# Patient Record
Sex: Female | Born: 1955
Health system: Southern US, Community
[De-identification: ages and names within clinical notes are randomized; demographics above are authoritative.]

## PROBLEM LIST (undated history)

## (undated) DIAGNOSIS — E785 Hyperlipidemia, unspecified: Secondary | ICD-10-CM

## (undated) DIAGNOSIS — R51 Headache: Secondary | ICD-10-CM

## (undated) DIAGNOSIS — N319 Neuromuscular dysfunction of bladder, unspecified: Secondary | ICD-10-CM

## (undated) DIAGNOSIS — R519 Headache, unspecified: Secondary | ICD-10-CM

## (undated) DIAGNOSIS — G35 Multiple sclerosis: Secondary | ICD-10-CM

## (undated) DIAGNOSIS — F0390 Unspecified dementia without behavioral disturbance: Secondary | ICD-10-CM

## (undated) HISTORY — PX: ABDOMINAL HYSTERECTOMY: SHX81

## (undated) HISTORY — DX: Neuromuscular dysfunction of bladder, unspecified: N31.9

## (undated) HISTORY — PX: OOPHORECTOMY: SHX86

## (undated) HISTORY — DX: Multiple sclerosis: G35

## (undated) HISTORY — DX: Hyperlipidemia, unspecified: E78.5

## (undated) HISTORY — DX: Headache, unspecified: R51.9

## (undated) HISTORY — DX: Headache: R51

---

## 1988-06-24 DIAGNOSIS — G35 Multiple sclerosis: Secondary | ICD-10-CM

## 1988-06-24 HISTORY — DX: Multiple sclerosis: G35

## 2004-04-28 ENCOUNTER — Emergency Department: Payer: Self-pay | Admitting: Emergency Medicine

## 2004-05-07 ENCOUNTER — Ambulatory Visit: Payer: Self-pay | Admitting: Specialist

## 2004-08-30 ENCOUNTER — Emergency Department: Payer: Self-pay | Admitting: Emergency Medicine

## 2006-06-03 ENCOUNTER — Ambulatory Visit: Payer: Self-pay | Admitting: Internal Medicine

## 2006-06-06 ENCOUNTER — Ambulatory Visit: Payer: Self-pay | Admitting: Internal Medicine

## 2007-04-16 ENCOUNTER — Ambulatory Visit: Payer: Self-pay | Admitting: Family Medicine

## 2007-04-22 ENCOUNTER — Encounter: Payer: Self-pay | Admitting: Family Medicine

## 2007-04-25 ENCOUNTER — Encounter: Payer: Self-pay | Admitting: Family Medicine

## 2007-05-25 ENCOUNTER — Encounter: Payer: Self-pay | Admitting: Family Medicine

## 2008-09-20 ENCOUNTER — Encounter: Payer: Self-pay | Admitting: Family Medicine

## 2008-09-22 ENCOUNTER — Encounter: Payer: Self-pay | Admitting: Family Medicine

## 2008-10-22 ENCOUNTER — Encounter: Payer: Self-pay | Admitting: Family Medicine

## 2010-02-14 ENCOUNTER — Emergency Department: Payer: Self-pay | Admitting: Emergency Medicine

## 2010-03-05 ENCOUNTER — Emergency Department: Payer: Self-pay | Admitting: Emergency Medicine

## 2012-06-09 ENCOUNTER — Encounter: Payer: Self-pay | Admitting: Neurology

## 2012-06-24 ENCOUNTER — Encounter: Payer: Self-pay | Admitting: Neurology

## 2012-09-17 ENCOUNTER — Ambulatory Visit: Payer: Self-pay | Admitting: Pain Medicine

## 2012-09-29 ENCOUNTER — Ambulatory Visit: Payer: Self-pay | Admitting: Pain Medicine

## 2012-09-30 ENCOUNTER — Ambulatory Visit: Payer: Self-pay | Admitting: Pain Medicine

## 2012-10-29 ENCOUNTER — Ambulatory Visit: Payer: Self-pay | Admitting: Pain Medicine

## 2012-11-17 ENCOUNTER — Ambulatory Visit: Payer: Self-pay | Admitting: Pain Medicine

## 2012-11-25 ENCOUNTER — Ambulatory Visit: Payer: Self-pay | Admitting: Pain Medicine

## 2012-12-16 ENCOUNTER — Ambulatory Visit: Payer: Self-pay | Admitting: Internal Medicine

## 2012-12-24 ENCOUNTER — Ambulatory Visit: Payer: Self-pay | Admitting: Pain Medicine

## 2013-01-20 ENCOUNTER — Ambulatory Visit: Payer: Self-pay | Admitting: Pain Medicine

## 2013-02-23 ENCOUNTER — Ambulatory Visit: Payer: Self-pay | Admitting: Pain Medicine

## 2013-03-03 ENCOUNTER — Ambulatory Visit: Payer: Self-pay | Admitting: Pain Medicine

## 2013-03-30 ENCOUNTER — Ambulatory Visit: Payer: Self-pay | Admitting: Pain Medicine

## 2013-04-28 ENCOUNTER — Ambulatory Visit: Payer: Self-pay | Admitting: Pain Medicine

## 2013-05-26 ENCOUNTER — Ambulatory Visit: Payer: Self-pay | Admitting: Pain Medicine

## 2013-06-09 ENCOUNTER — Ambulatory Visit: Payer: Self-pay | Admitting: Pain Medicine

## 2013-07-05 ENCOUNTER — Ambulatory Visit: Payer: Self-pay | Admitting: Pain Medicine

## 2013-07-12 ENCOUNTER — Ambulatory Visit: Payer: Self-pay | Admitting: Pain Medicine

## 2013-08-05 ENCOUNTER — Ambulatory Visit: Payer: Self-pay | Admitting: Pain Medicine

## 2013-09-02 ENCOUNTER — Ambulatory Visit: Payer: Self-pay | Admitting: Pain Medicine

## 2013-09-08 ENCOUNTER — Ambulatory Visit: Payer: Self-pay | Admitting: Pain Medicine

## 2013-09-30 ENCOUNTER — Ambulatory Visit: Payer: Self-pay | Admitting: Pain Medicine

## 2013-11-11 DIAGNOSIS — M545 Low back pain, unspecified: Secondary | ICD-10-CM | POA: Insufficient documentation

## 2013-11-17 ENCOUNTER — Emergency Department: Payer: Self-pay

## 2013-11-17 LAB — COMPREHENSIVE METABOLIC PANEL
ALK PHOS: 71 U/L
Albumin: 3.9 g/dL (ref 3.4–5.0)
Anion Gap: 5 — ABNORMAL LOW (ref 7–16)
BUN: 11 mg/dL (ref 7–18)
Bilirubin,Total: 0.4 mg/dL (ref 0.2–1.0)
CALCIUM: 9.1 mg/dL (ref 8.5–10.1)
Chloride: 110 mmol/L — ABNORMAL HIGH (ref 98–107)
Co2: 26 mmol/L (ref 21–32)
Creatinine: 0.57 mg/dL — ABNORMAL LOW (ref 0.60–1.30)
EGFR (African American): >60
Glucose: 88 mg/dL (ref 65–99)
OSMOLALITY: 280 (ref 275–301)
Potassium: 4.3 mmol/L (ref 3.5–5.1)
SGOT(AST): 29 U/L (ref 15–37)
SGPT (ALT): 19 U/L (ref 12–78)
Sodium: 141 mmol/L (ref 136–145)
Total Protein: 8 g/dL (ref 6.4–8.2)

## 2013-11-17 LAB — CBC WITH DIFFERENTIAL/PLATELET
BASOS ABS: 0.1 10*3/uL (ref 0.0–0.1)
Basophil %: 0.6 %
Eosinophil #: 0 10*3/uL (ref 0.0–0.7)
Eosinophil %: 0.3 %
HCT: 43.1 % (ref 35.0–47.0)
HGB: 14.2 g/dL (ref 12.0–16.0)
LYMPHS ABS: 1.2 10*3/uL (ref 1.0–3.6)
Lymphocyte %: 12.5 %
MCH: 33.6 pg (ref 26.0–34.0)
MCHC: 32.9 g/dL (ref 32.0–36.0)
MCV: 102 fL — ABNORMAL HIGH (ref 80–100)
Monocyte #: 0.5 x10 3/mm (ref 0.2–0.9)
Monocyte %: 5.5 %
NEUTROS ABS: 7.6 10*3/uL — AB (ref 1.4–6.5)
Neutrophil %: 81.1 %
Platelet: 202 10*3/uL (ref 150–440)
RBC: 4.22 10*6/uL (ref 3.80–5.20)
RDW: 13.7 % (ref 11.5–14.5)
WBC: 9.3 10*3/uL (ref 3.6–11.0)

## 2013-11-17 LAB — URINALYSIS, COMPLETE
BILIRUBIN, UR: NEGATIVE
BLOOD: NEGATIVE
Bacteria: NONE SEEN
Glucose,UR: NEGATIVE mg/dL (ref 0–75)
Leukocyte Esterase: NEGATIVE
Nitrite: NEGATIVE
PROTEIN: NEGATIVE
Ph: 6 (ref 4.5–8.0)
Specific Gravity: 1.019 (ref 1.003–1.030)

## 2013-11-17 LAB — TROPONIN I

## 2014-06-06 ENCOUNTER — Ambulatory Visit: Payer: Self-pay | Admitting: Unknown Physician Specialty

## 2014-08-09 ENCOUNTER — Ambulatory Visit: Payer: Self-pay | Admitting: Neurology

## 2014-10-17 LAB — SURGICAL PATHOLOGY

## 2014-12-02 ENCOUNTER — Ambulatory Visit: Payer: Self-pay | Admitting: Family Medicine

## 2014-12-28 ENCOUNTER — Ambulatory Visit: Payer: Medicare Other | Admitting: Family Medicine

## 2015-01-03 ENCOUNTER — Ambulatory Visit (INDEPENDENT_AMBULATORY_CARE_PROVIDER_SITE_OTHER): Payer: Medicare Other | Admitting: Family Medicine

## 2015-01-03 ENCOUNTER — Encounter: Payer: Self-pay | Admitting: Family Medicine

## 2015-01-03 VITALS — BP 123/81 | HR 58 | Temp 97.7°F | Resp 16 | Ht 65.0 in | Wt 165.4 lb

## 2015-01-03 DIAGNOSIS — F325 Major depressive disorder, single episode, in full remission: Secondary | ICD-10-CM | POA: Insufficient documentation

## 2015-01-03 DIAGNOSIS — J449 Chronic obstructive pulmonary disease, unspecified: Secondary | ICD-10-CM | POA: Diagnosis not present

## 2015-01-03 DIAGNOSIS — R32 Unspecified urinary incontinence: Secondary | ICD-10-CM | POA: Insufficient documentation

## 2015-01-03 DIAGNOSIS — N393 Stress incontinence (female) (male): Secondary | ICD-10-CM | POA: Diagnosis not present

## 2015-01-03 DIAGNOSIS — N319 Neuromuscular dysfunction of bladder, unspecified: Secondary | ICD-10-CM | POA: Diagnosis not present

## 2015-01-03 DIAGNOSIS — G8929 Other chronic pain: Secondary | ICD-10-CM | POA: Insufficient documentation

## 2015-01-03 DIAGNOSIS — F32A Depression, unspecified: Secondary | ICD-10-CM

## 2015-01-03 DIAGNOSIS — Z0000 Encounter for general adult medical examination without abnormal findings: Secondary | ICD-10-CM | POA: Diagnosis not present

## 2015-01-03 DIAGNOSIS — F329 Major depressive disorder, single episode, unspecified: Secondary | ICD-10-CM

## 2015-01-03 DIAGNOSIS — E782 Mixed hyperlipidemia: Secondary | ICD-10-CM | POA: Diagnosis not present

## 2015-01-03 DIAGNOSIS — G35 Multiple sclerosis: Secondary | ICD-10-CM | POA: Insufficient documentation

## 2015-01-03 DIAGNOSIS — M810 Age-related osteoporosis without current pathological fracture: Secondary | ICD-10-CM

## 2015-01-03 DIAGNOSIS — J432 Centrilobular emphysema: Secondary | ICD-10-CM | POA: Insufficient documentation

## 2015-01-03 DIAGNOSIS — M8580 Other specified disorders of bone density and structure, unspecified site: Secondary | ICD-10-CM | POA: Insufficient documentation

## 2015-01-03 DIAGNOSIS — K219 Gastro-esophageal reflux disease without esophagitis: Secondary | ICD-10-CM | POA: Insufficient documentation

## 2015-01-03 DIAGNOSIS — G35A Relapsing-remitting multiple sclerosis: Secondary | ICD-10-CM | POA: Insufficient documentation

## 2015-01-03 MED ORDER — PRAVASTATIN SODIUM 40 MG PO TABS: 40.0000 mg | ORAL_TABLET | Freq: Every day | ORAL | Status: DC

## 2015-01-03 MED ORDER — ALBUTEROL SULFATE HFA 108 (90 BASE) MCG/ACT IN AERS: 2.0000 | INHALATION_SPRAY | Freq: Four times a day (QID) | RESPIRATORY_TRACT | Status: DC | PRN

## 2015-01-03 NOTE — Assessment & Plan Note (Signed)
Renewed pravastatin today. Check lipid panel.

## 2015-01-03 NOTE — Assessment & Plan Note (Signed)
Incontinence related to MS. Renewed incontinence supplies today.

## 2015-01-03 NOTE — Addendum Note (Signed)
Addended byLaroy Apple, AMY L on: 01/03/2015 11:17 AM   Modules accepted: SmartSet

## 2015-01-03 NOTE — Patient Instructions (Signed)
We will check your lung function with a breathing test at the hospital. We will also check in on your bones by doing a bone study for your osteoporosis.   Please get your lab work done to check in on your cholesterol and kidney function.

## 2015-01-03 NOTE — Progress Notes (Addendum)
Subjective:    Patient ID: [name], female    DOB: 1956/01/06, 59 y.o.   MRN: [id number]  HPI: [name] is a 59 y.o. female presenting on 01/03/2015 for Annual Exam   HPI  Pt presents for annual exam. Overall doing well at home. She has a history of MS that is treated by [facility] she takes Avonex once weekly for her symptoms. Also seeing psych and chronic pain.  Her depression is treated by psychiatry.  She does have incontinence related to neurogenic bladder from MS. Pt reports she is unable to feel when she has to void, urine leaks out. She is currently using depends and pads to manage her incotinence.  Pt has history of osteoporosis for which she takes fosamx once weekly. Unsure of last bone density scan. Pt is requesting a renewal of her pravastatin today.   No pap smear is needed due to hysterecotomy. Mammogram done in 2015- no family history of breast cancer, would like to screen every 2 years.  Colonoscopy done 2015.   Past Medical History  Diagnosis Date  . Hyperlipidemia   . Headache   . Depression   . MS (multiple sclerosis) 1990  . Neurogenic bladder   . Osteoporosis    History   Social History  . Marital Status: Divorced    Spouse Name: N/A  . Number of Children: N/A  . Years of Education: N/A   Occupational History  . Not on file.   Social History Main Topics  . Smoking status: Former Smoker -- 1.00 packs/day for 20 years    Quit date: 01/03/1995  . Smokeless tobacco: Not on file  . Alcohol Use: No  . Drug Use: No  . Sexual Activity: Not on file   Other Topics Concern  . Not on file   Social History Narrative  . No narrative on file   Family History  Problem Relation Age of Onset  . Diabetes Mother   . Heart disease Mother   . Diabetes Father   . Heart disease Father   . Diabetes Sister   . Heart disease Sister    No current outpatient prescriptions on file prior to visit.   No current facility-administered medications on  file prior to visit.    Review of Systems  Constitutional: Negative for fever and chills.  HENT: Negative.   Eyes: Negative for photophobia and visual disturbance.  Respiratory: Negative for chest tightness, shortness of breath and wheezing.   Cardiovascular: Negative for chest pain, palpitations and leg swelling.  Gastrointestinal: Negative for nausea, vomiting and abdominal pain.  Endocrine: Negative for cold intolerance, heat intolerance, polydipsia, polyphagia and polyuria.  Genitourinary: Positive for difficulty urinating (neurogenic bladder). Negative for vaginal bleeding, vaginal discharge, vaginal pain and pelvic pain.  Musculoskeletal: Positive for gait problem (due to MS).  Skin: Negative.   Neurological: Positive for weakness (from MS) and numbness (from MS.). Negative for dizziness, syncope and facial asymmetry.  Psychiatric/Behavioral: Positive for dysphoric mood (managed by psychiatry.).   Per HPI unless specifically indicated above     Objective:    BP 123/81 mmHg  Pulse 58  Temp(Src) 97.7 F (36.5 C) (Oral)  Resp 16  Ht  (1.651 m)  Wt 165 lb 6.4 oz (75.025 kg)  BMI 27.52 kg/m2  LMP   Wt Readings from Last 3 Encounters:  01/03/15 165 lb 6.4 oz (75.025 kg)    Physical Exam  Constitutional: She is oriented to person, place, and time.  She appears well-developed and well-nourished.  HENT:  Head: Normocephalic and atraumatic.  Neck: Neck supple.  Cardiovascular: Normal rate, regular rhythm and normal heart sounds.  Exam reveals no gallop and no friction rub.   No murmur heard. Pulmonary/Chest: Effort normal and breath sounds normal. She has no wheezes. She exhibits no tenderness.  Abdominal: Soft. Normal appearance and bowel sounds are normal. She exhibits no distension and no mass. There is no tenderness. There is no rebound and no guarding.  Genitourinary: No breast swelling, tenderness, discharge or bleeding.  Pt declined bimanual exam today.    Musculoskeletal: Normal range of motion. She exhibits no edema or tenderness.  Lymphadenopathy:    She has no cervical adenopathy.  Neurological: She is alert and oriented to person, place, and time. She displays no tremor. No cranial nerve deficit or sensory deficit. She exhibits normal muscle tone. She displays no seizure activity. Gait abnormal.  Skin: Skin is warm and dry.       Assessment & Plan:   Problem List Items Addressed This Visit      Respiratory   COPD (chronic obstructive pulmonary disease)   Relevant Medications   tiotropium (SPIRIVA) 18 MCG inhalation capsule   albuterol (PROVENTIL HFA;VENTOLIN HFA) 108 (90 BASE) MCG/ACT inhaler   Other Relevant Orders   Pulmonary function test     Nervous and Auditory   MS (multiple sclerosis)   Relevant Medications   Interferon Beta-1a (AVONEX PREFILLED IM)     Musculoskeletal and Integument   Osteoporosis    Pt reports taking Fosamax once weekly. She is unsure how [place] she has been on this medication. Obtain new DXA scan. Consider d/cing medication if duration of therapy > 5 years.  Check Vitamin D.      Relevant Orders   DG Bone Density   Vit D  25 hydroxy (rtn osteoporosis monitoring)     Other   Mixed hyperlipidemia    Renewed pravastatin today. Check lipid panel.       Relevant Medications   pravastatin (PRAVACHOL) 40 MG tablet   Other Relevant Orders   Comprehensive Metabolic Panel (CMET)   Lipid Profile   Depression   Chronic pain   Relevant Medications   meloxicam (MOBIC) 7.5 MG tablet   gabapentin (NEURONTIN) 300 MG capsule   Neurogenic bladder    Incontinence related to MS. Renewed incontinence supplies today.       Incontinence in female    Renewed incontinence supplies today.        Other Visit Diagnoses    Annual physical exam    -  Primary       Meds ordered this encounter  Medications  . meloxicam (MOBIC) 7.5 MG tablet    Sig: Take 7.5 mg by mouth 2 (two) times daily.  [name] Kitchen gabapentin  (NEURONTIN) 300 MG capsule    Sig: Take 300 mg by mouth 3 (three) times daily.  . potassium chloride (K-DUR) 10 MEQ tablet    Sig: Take 10 mEq by mouth daily.  [name] Kitchen DISCONTD: pravastatin (PRAVACHOL) 40 MG tablet    Sig: Take 40 mg by mouth daily.  . pantoprazole (PROTONIX) 40 MG tablet    Sig: Take 40 mg by mouth daily.  [name] Kitchen tiotropium (SPIRIVA) 18 MCG inhalation capsule    Sig: Place 18 mcg into inhaler and inhale daily.  . Interferon Beta-1a (AVONEX PREFILLED IM)    Sig: Inject 30 mg into the muscle once a week.  . pravastatin (PRAVACHOL) 40 MG tablet  Sig: Take 1 tablet (40 mg total) by mouth daily.    Dispense:  30 tablet    Refill:  11    Order Specific Question:  Supervising Provider    Answer:  Janeann Forehand [phone]  . albuterol (PROVENTIL HFA;VENTOLIN HFA) 108 (90 BASE) MCG/ACT inhaler    Sig: Inhale 2 puffs into the lungs every 6 (six) hours as needed for wheezing or shortness of breath.    Dispense:  1 Inhaler    Refill:  11    Order Specific Question:  Supervising Provider    Answer:  Janeann Forehand [[id number]]      Follow up plan: Return in about 6 months (around 07/06/2015) for hyperlipidemia.

## 2015-01-03 NOTE — Assessment & Plan Note (Signed)
Renewed incontinence supplies today.

## 2015-01-03 NOTE — Assessment & Plan Note (Signed)
Pt reports taking Fosamax once weekly. She is unsure how [place] she has been on this medication. Obtain new DXA scan. Consider d/cing medication if duration of therapy > 5 years.  Check Vitamin D.

## 2015-01-04 ENCOUNTER — Other Ambulatory Visit: Payer: Self-pay | Admitting: Family Medicine

## 2015-01-04 ENCOUNTER — Telehealth: Payer: Self-pay

## 2015-01-04 ENCOUNTER — Other Ambulatory Visit: Payer: Self-pay

## 2015-01-04 DIAGNOSIS — M810 Age-related osteoporosis without current pathological fracture: Secondary | ICD-10-CM

## 2015-01-04 DIAGNOSIS — J449 Chronic obstructive pulmonary disease, unspecified: Secondary | ICD-10-CM

## 2015-01-04 NOTE — Telephone Encounter (Signed)
Spoke to aid taking care of pt told her about 2 appointment that we scheduled one for spirometry on 01/10/2015 at 10:00 am needed to arrive by 9:30 am and next is bone density on 01/11/2015 at 9:40 am  At [facility]

## 2015-01-07 LAB — LIPID PANEL
CHOL/HDL RATIO: 3.3 ratio (ref 0.0–4.4)
CHOLESTEROL TOTAL: 170 mg/dL (ref 100–199)
HDL: 52 mg/dL (ref >39)
LDL CALC: 96 mg/dL (ref 0–99)
TRIGLYCERIDES: 110 mg/dL (ref 0–149)
VLDL CHOLESTEROL CAL: 22 mg/dL (ref 5–40)

## 2015-01-07 LAB — COMPREHENSIVE METABOLIC PANEL
A/G RATIO: 2 (ref 1.1–2.5)
ALBUMIN: 4.5 g/dL (ref 3.5–5.5)
ALT: 18 IU/L (ref 0–32)
AST: 19 IU/L (ref 0–40)
Alkaline Phosphatase: 81 IU/L (ref 39–117)
BILIRUBIN TOTAL: 0.5 mg/dL (ref 0.0–1.2)
BUN/Creatinine Ratio: 17 (ref 9–23)
BUN: 11 mg/dL (ref 6–24)
CHLORIDE: 101 mmol/L (ref 97–108)
CO2: 25 mmol/L (ref 18–29)
Calcium: 9.5 mg/dL (ref 8.7–10.2)
Creatinine, Ser: 0.64 mg/dL (ref 0.57–1.00)
GFR calc Af Amer: 115 mL/min/{1.73_m2} (ref >59)
GFR calc non Af Amer: 99 mL/min/{1.73_m2} (ref >59)
GLUCOSE: 94 mg/dL (ref 65–99)
Globulin, Total: 2.2 g/dL (ref 1.5–4.5)
Potassium: 4.5 mmol/L (ref 3.5–5.2)
Sodium: 144 mmol/L (ref 134–144)
TOTAL PROTEIN: 6.7 g/dL (ref 6.0–8.5)

## 2015-01-07 LAB — VITAMIN D 25 HYDROXY (VIT D DEFICIENCY, FRACTURES): Vit D, 25-Hydroxy: 21.7 ng/mL — ABNORMAL LOW (ref 30.0–100.0)

## 2015-01-10 ENCOUNTER — Telehealth: Payer: Self-pay | Admitting: Family Medicine

## 2015-01-10 ENCOUNTER — Other Ambulatory Visit: Payer: Self-pay | Admitting: Family Medicine

## 2015-01-10 ENCOUNTER — Ambulatory Visit: Payer: Medicare Other | Attending: Family Medicine | Admitting: Internal Medicine

## 2015-01-10 DIAGNOSIS — J449 Chronic obstructive pulmonary disease, unspecified: Secondary | ICD-10-CM

## 2015-01-10 DIAGNOSIS — E559 Vitamin D deficiency, unspecified: Secondary | ICD-10-CM

## 2015-01-10 MED ORDER — CHOLECALCIFEROL 50 MCG (2000 UT) PO CAPS: 1.0000 | ORAL_CAPSULE | Freq: Every day | ORAL | Status: DC

## 2015-01-10 MED ORDER — ALBUTEROL SULFATE (2.5 MG/3ML) 0.083% IN NEBU
2.5000 mg | INHALATION_SOLUTION | Freq: Once | RESPIRATORY_TRACT | Status: AC
  Administered 2015-01-10: 2.5 mg via RESPIRATORY_TRACT
  Filled 2015-01-10: qty 3

## 2015-01-10 NOTE — Telephone Encounter (Signed)
Reviewed lab results with patient. Her vitamin D is low. She will start 2000 IU OTC daily. Sent to pharmacy. Pt spirometry also not consistent with COPD. Pt reports her previous provider did a breathing test years back and started her on medications. Pt reports she never feels short of breath "unless I am running or in a hurry."  Pt would like to try a few days without Spiriva to see how her breathing does. Consider pulmonology referral if breathing issues continue.

## 2015-01-11 ENCOUNTER — Ambulatory Visit
Admission: RE | Admit: 2015-01-11 | Discharge: 2015-01-11 | Disposition: A | Discharge status: Home / Self Care | Payer: Medicare Other | Source: Ambulatory Visit | Attending: Family Medicine | Admitting: Family Medicine

## 2015-01-11 DIAGNOSIS — Z87310 Personal history of (healed) osteoporosis fracture: Secondary | ICD-10-CM | POA: Diagnosis present

## 2015-01-11 DIAGNOSIS — M85862 Other specified disorders of bone density and structure, left lower leg: Secondary | ICD-10-CM | POA: Diagnosis not present

## 2015-01-12 ENCOUNTER — Telehealth: Payer: Self-pay | Admitting: Family Medicine

## 2015-01-12 NOTE — Telephone Encounter (Signed)
Called pt to review bone density scan- she is osteopenic but not a candidate for treatment. Encouraged 20 minutes weight bearing exercise, 1200mg  dietary calcium, and vitamin D daily.

## 2015-03-07 ENCOUNTER — Other Ambulatory Visit: Payer: Self-pay | Admitting: Neurology

## 2015-03-07 DIAGNOSIS — G35 Multiple sclerosis: Secondary | ICD-10-CM

## 2015-04-24 DIAGNOSIS — M79605 Pain in left leg: Secondary | ICD-10-CM | POA: Insufficient documentation

## 2015-05-29 ENCOUNTER — Ambulatory Visit (INDEPENDENT_AMBULATORY_CARE_PROVIDER_SITE_OTHER): Payer: Medicare Other | Admitting: Family Medicine

## 2015-05-29 ENCOUNTER — Encounter: Payer: Self-pay | Admitting: Family Medicine

## 2015-05-29 VITALS — BP 110/71 | HR 74 | Temp 98.0°F | Resp 16 | Ht 65.0 in | Wt 160.0 lb

## 2015-05-29 DIAGNOSIS — N760 Acute vaginitis: Secondary | ICD-10-CM

## 2015-05-29 DIAGNOSIS — R300 Dysuria: Secondary | ICD-10-CM

## 2015-05-29 DIAGNOSIS — G35 Multiple sclerosis: Secondary | ICD-10-CM

## 2015-05-29 LAB — POCT WET PREP (WET MOUNT): KOH WET PREP POC: NEGATIVE

## 2015-05-29 MED ORDER — CANE MISC: 1.0000 | Status: AC | PRN

## 2015-05-29 MED ORDER — FLUCONAZOLE 150 MG PO TABS: 150.0000 mg | ORAL_TABLET | Freq: Once | ORAL | Status: DC

## 2015-05-29 NOTE — Assessment & Plan Note (Signed)
Managed by neuro. Pt requesting cane prescription to help with ambulation today.

## 2015-05-29 NOTE — Patient Instructions (Addendum)
Monilial Vaginitis Vaginitis in a soreness, swelling and redness (inflammation) of the vagina and vulva. Monilial vaginitis is not a sexually transmitted infection. CAUSES  Yeast vaginitis is caused by yeast (candida) that is normally found in your vagina. With a yeast infection, the candida has overgrown in number to a point that upsets the chemical balance. SYMPTOMS   White, thick vaginal discharge.  Swelling, itching, redness and irritation of the vagina and possibly the lips of the vagina (vulva).  Burning or painful urination.  Painful intercourse. DIAGNOSIS  Things that may contribute to monilial vaginitis are:  Postmenopausal and virginal states.  Pregnancy.  Infections.  Being tired, sick or stressed, especially if you had monilial vaginitis in the past.  Diabetes. Good control will help lower the chance.  Birth control pills.  Tight fitting garments.  Using bubble bath, feminine sprays, douches or deodorant tampons.  Taking certain medications that kill germs (antibiotics).  Sporadic recurrence can occur if you become ill. TREATMENT  Your caregiver will give you medication.  There are several kinds of anti monilial vaginal creams and suppositories specific for monilial vaginitis. For recurrent yeast infections, use a suppository or cream in the vagina 2 times a week, or as directed.  Anti-monilial or steroid cream for the itching or irritation of the vulva may also be used. Get your caregiver's permission.  Painting the vagina with methylene blue solution may help if the monilial cream does not work.  Eating yogurt may help prevent monilial vaginitis. HOME CARE INSTRUCTIONS   Finish all medication as prescribed.  Do not have sex until treatment is completed or after your caregiver tells you it is okay.  Take warm sitz baths.  Do not douche.  Do not use tampons, especially scented ones.  Wear cotton underwear.  Avoid tight pants and panty  hose.  Tell your sexual partner that you have a yeast infection. They should go to their caregiver if they have symptoms such as mild rash or itching.  Your sexual partner should be treated as well if your infection is difficult to eliminate.  Practice safer sex. Use condoms.  Some vaginal medications cause latex condoms to fail. Vaginal medications that harm condoms are:  Cleocin cream.  Butoconazole (Femstat).  Terconazole (Terazol) vaginal suppository.  Miconazole (Monistat) (may be purchased over the counter). SEEK MEDICAL CARE IF:   You have a temperature by mouth above 102 F (38.9 C).  The infection is getting worse after 2 days of treatment.  The infection is not getting better after 3 days of treatment.  You develop blisters in or around your vagina.  You develop vaginal bleeding, and it is not your menstrual period.  You have pain when you urinate.  You develop intestinal problems.  You have pain with sexual intercourse.   This information is not intended to replace advice given to you by your health care provider. Make sure you discuss any questions you have with your health care provider.   Document Released: 03/20/2005 Document Revised: 09/02/2011 Document Reviewed: 12/12/2014 Elsevier Interactive Patient Education 2016 Else[employer] Inc[employer]  Monilial Vaginitis Vaginitis in a soreness, swelling and redness (inflammation) of the vagina and vulva. Monilial vaginitis is not a sexually transmitted infection. CAUSES  Yeast vaginitis is caused by yeast (candida) that is normally found in your vagina. With a yeast infection, the candida has overgrown in number to a point that upsets the chemical balance. SYMPTOMS  White, thick vaginal discharge. Swelling, itching, redness and irritation of the vagina and possibly   the lips of the vagina (vulva). Burning or painful urination. Painful intercourse. DIAGNOSIS  Things that may contribute to monilial vaginitis  are: Postmenopausal and virginal states. Pregnancy. Infections. Being tired, sick or stressed, especially if you had monilial vaginitis in the past. Diabetes. Good control will help lower the chance. Birth control pills. Tight fitting garments. Using bubble bath, feminine sprays, douches or deodorant tampons. Taking certain medications that kill germs (antibiotics). Sporadic recurrence can occur if you become ill. TREATMENT  Your caregiver will give you medication. There are several kinds of anti monilial vaginal creams and suppositories specific for monilial vaginitis. For recurrent yeast infections, use a suppository or cream in the vagina 2 times a week, or as directed. Anti-monilial or steroid cream for the itching or irritation of the vulva may also be used. Get your caregiver's permission. Painting the vagina with methylene blue solution may help if the monilial cream does not work. Eating yogurt may help prevent monilial vaginitis. HOME CARE INSTRUCTIONS  Finish all medication as prescribed. Do not have sex until treatment is completed or after your caregiver tells you it is okay. Take warm sitz baths. Do not douche. Do not use tampons, especially scented ones. Wear cotton underwear. Avoid tight pants and panty hose. Tell your sexual partner that you have a yeast infection. They should go to their caregiver if they have symptoms such as mild rash or itching. Your sexual partner should be treated as well if your infection is difficult to eliminate. Practice safer sex. Use condoms. Some vaginal medications cause latex condoms to fail. Vaginal medications that harm condoms are: Cleocin cream. Butoconazole (Femstat). Terconazole (Terazol) vaginal suppository. Miconazole (Monistat) (may be purchased over the counter). SEEK MEDICAL CARE IF:  You have a temperature by mouth above 102 F (38.9 C). The infection is getting worse after 2 days of treatment. The infection is not  getting better after 3 days of treatment. You develop blisters in or around your vagina. You develop vaginal bleeding, and it is not your menstrual period. You have pain when you urinate. You develop intestinal problems. You have pain with sexual intercourse.   This information is not intended to replace advice given to you by your health care provider. Make sure you discuss any questions you have with your health care provider.   Document Released: 03/20/2005 Document Revised: 09/02/2011 Document Reviewed: 12/12/2014 Elsevier Interactive Patient Education 2016 Elsevier Inc.  

## 2015-05-29 NOTE — Progress Notes (Signed)
Subjective:    Patient ID: [name], female    DOB: February 14, 1956, 59 y.o.   MRN: [id number]  HPI: [name] is a 59 y.o. female presenting on 05/29/2015 for Vaginitis   Vaginal Discharge The patient's primary symptoms include genital itching and vaginal discharge. The patient's pertinent negatives include no genital lesions or genital odor. This is a new problem. The current episode started in the past 7 days. The problem has been gradually worsening. The pain is mild. Associated symptoms include dysuria. Pertinent negatives include no chills, flank pain, frequency, hematuria, joint swelling, painful intercourse or vomiting. The vaginal discharge was white. There has been no bleeding. She is not sexually active. She is postmenopausal.  Pt also reporting itching buring including urination.   Past Medical History  Diagnosis Date  . Hyperlipidemia   . Headache   . Depression   . MS (multiple sclerosis) 1990  . Neurogenic bladder   . Osteoporosis    Past Surgical History  Procedure Laterality Date  . Abdominal hysterectomy    . Oophorectomy Bilateral     Current Outpatient Prescriptions on File Prior to Visit  Medication Sig  . albuterol (PROVENTIL HFA;VENTOLIN HFA) 108 (90 BASE) MCG/ACT inhaler Inhale 2 puffs into the lungs every 6 (six) hours as needed for wheezing or shortness of breath.  . Cholecalciferol 2000 UNITS CAPS Take 1 capsule (2,000 Units total) by mouth daily.  [name] Kitchen gabapentin (NEURONTIN) 300 MG capsule Take 300 mg by mouth 3 (three) times daily.  . Interferon Beta-1a (AVONEX PREFILLED IM) Inject 30 mg into the muscle once a week.  . meloxicam (MOBIC) 7.5 MG tablet Take 7.5 mg by mouth 2 (two) times daily.  . pantoprazole (PROTONIX) 40 MG tablet Take 40 mg by mouth daily.  . potassium chloride (K-DUR) 10 MEQ tablet Take 10 mEq by mouth daily.  . pravastatin (PRAVACHOL) 40 MG tablet Take 1 tablet (40 mg total) by mouth daily.  [name] Kitchen tiotropium (SPIRIVA) 18 MCG  inhalation capsule Place 18 mcg into inhaler and inhale daily.   No current facility-administered medications on file prior to visit.    Review of Systems  Constitutional: Negative for chills.  Respiratory: Negative for chest tightness and shortness of breath.   Cardiovascular: Negative for chest pain, palpitations and leg swelling.  Gastrointestinal: Negative for vomiting.  Genitourinary: Positive for dysuria and vaginal discharge. Negative for frequency, hematuria and flank pain.   Per HPI unless specifically indicated above     Objective:    BP 110/71 mmHg  Pulse 74  Temp(Src) 98 F (36.7 C) (Oral)  Resp 16  Ht 5\' 5"  (1.651 m)  Wt 160 lb (72.576 kg)  BMI 26.63 kg/m2  Wt Readings from Last 3 Encounters:  05/29/15 160 lb (72.576 kg)  01/03/15 165 lb 6.4 oz (75.025 kg)    Physical Exam  Constitutional: She is oriented to person, place, and time. She appears well-developed and well-nourished.  HENT:  Head: Normocephalic and atraumatic.  Neck: Normal range of motion. Neck supple. No thyromegaly present.  Cardiovascular: Normal rate and regular rhythm.  Exam reveals no gallop and no friction rub.   No murmur heard. Pulmonary/Chest: Breath sounds normal. No respiratory distress. She has no wheezes.  Abdominal: Soft. Bowel sounds are normal. She exhibits no distension. There is no tenderness.  Genitourinary:    No breast swelling, tenderness or discharge. There is no rash or tenderness on the right labia. There is no rash, tenderness or lesion on the left  labia. No tenderness in the vagina. Vaginal discharge (copious amounts white cheesy discharge. ) found.  Uterus surgically absent.   Musculoskeletal: Normal range of motion. She exhibits no edema or tenderness.  Lymphadenopathy:    She has no cervical adenopathy.  Neurological: She is alert and oriented to person, place, and time.  Skin: Skin is warm and dry.  Psychiatric: She has a normal mood and affect. Her behavior is  normal. Judgment normal.   Results for orders placed or performed in visit on 05/29/15  POCT Wet Prep [facility])  Result Value Ref Range   Source Wet Prep POC     WBC, Wet Prep HPF POC few    Bacteria Wet Prep HPF POC Few None, Few, Too numerous to count   BACTERIA WET PREP MORPHOLOGY POC     Clue Cells Wet Prep HPF POC None None, Too numerous to count   Clue Cells Wet Prep Whiff POC     Yeast Wet Prep HPF POC Many    KOH Wet Prep POC neg    Trichomonas Wet Prep HPF POC none       Assessment & Plan:   Problem List Items Addressed This Visit      Nervous and Auditory   MS (multiple sclerosis) (HCC)    Managed by neuro. Pt requesting cane prescription to help with ambulation today.       Relevant Medications   Misc. Devices (CANE) MISC    Other Visit Diagnoses    Vaginitis    -  Primary    Treat for yeast, positive wet prep. Diflucan x 1.  RTC is symptoms not improved.     Relevant Orders    POCT Wet Prep [facility]) (Completed)    Dysuria        Pt unable to void today and give urine sample. Pt instructed to return to give urine sample if symptoms continue.        Meds ordered this encounter  Medications  . fluconazole (DIFLUCAN) 150 MG tablet    Sig: Take 1 tablet (150 mg total) by mouth once.    Dispense:  1 tablet    Refill:  0    Order Specific Question:  Supervising Provider    Answer:  Janeann Forehand [[id number]]  . Misc. Devices (CANE) MISC    Sig: 1 each by Does not apply route as needed.    Dispense:  1 each    Refill:  0    Order Specific Question:  Supervising Provider    Answer:  Janeann Forehand [phone]      Follow up plan: Return if symptoms worsen or fail to improve.

## 2015-07-11 ENCOUNTER — Ambulatory Visit: Payer: Medicare Other | Admitting: Family Medicine

## 2015-07-12 ENCOUNTER — Other Ambulatory Visit: Payer: Self-pay | Admitting: Family Medicine

## 2015-07-12 NOTE — Telephone Encounter (Signed)
Received rx request from pharmacy for refill.

## 2015-07-13 MED ORDER — TIOTROPIUM BROMIDE MONOHYDRATE 18 MCG IN CAPS: 18.0000 ug | ORAL_CAPSULE | Freq: Every day | RESPIRATORY_TRACT | Status: DC

## 2015-07-13 MED ORDER — ALENDRONATE SODIUM 70 MG PO TABS: 70.0000 mg | ORAL_TABLET | ORAL | Status: DC

## 2015-07-13 MED ORDER — PANTOPRAZOLE SODIUM 40 MG PO TBEC: 40.0000 mg | DELAYED_RELEASE_TABLET | Freq: Every day | ORAL | Status: DC

## 2015-07-13 MED ORDER — SOLIFENACIN SUCCINATE 5 MG PO TABS: 5.0000 mg | ORAL_TABLET | Freq: Every day | ORAL | Status: DC

## 2016-01-23 ENCOUNTER — Other Ambulatory Visit: Payer: Self-pay | Admitting: Family Medicine

## 2016-01-23 DIAGNOSIS — E559 Vitamin D deficiency, unspecified: Secondary | ICD-10-CM

## 2016-01-23 DIAGNOSIS — E782 Mixed hyperlipidemia: Secondary | ICD-10-CM

## 2016-01-23 DIAGNOSIS — J449 Chronic obstructive pulmonary disease, unspecified: Secondary | ICD-10-CM

## 2016-02-02 ENCOUNTER — Ambulatory Visit (INDEPENDENT_AMBULATORY_CARE_PROVIDER_SITE_OTHER): Payer: Medicare Other | Admitting: Family Medicine

## 2016-02-02 ENCOUNTER — Encounter: Payer: Self-pay | Admitting: Family Medicine

## 2016-02-02 VITALS — BP 130/79 | HR 67 | Temp 98.3°F | Resp 16 | Ht 65.0 in | Wt 141.0 lb

## 2016-02-02 DIAGNOSIS — E559 Vitamin D deficiency, unspecified: Secondary | ICD-10-CM

## 2016-02-02 DIAGNOSIS — Z0000 Encounter for general adult medical examination without abnormal findings: Secondary | ICD-10-CM | POA: Diagnosis not present

## 2016-02-02 DIAGNOSIS — G35 Multiple sclerosis: Secondary | ICD-10-CM | POA: Diagnosis not present

## 2016-02-02 DIAGNOSIS — R32 Unspecified urinary incontinence: Secondary | ICD-10-CM

## 2016-02-02 DIAGNOSIS — N319 Neuromuscular dysfunction of bladder, unspecified: Secondary | ICD-10-CM

## 2016-02-02 DIAGNOSIS — N393 Stress incontinence (female) (male): Secondary | ICD-10-CM

## 2016-02-02 DIAGNOSIS — Z1211 Encounter for screening for malignant neoplasm of colon: Secondary | ICD-10-CM | POA: Diagnosis not present

## 2016-02-02 DIAGNOSIS — J449 Chronic obstructive pulmonary disease, unspecified: Secondary | ICD-10-CM

## 2016-02-02 DIAGNOSIS — Z1239 Encounter for other screening for malignant neoplasm of breast: Secondary | ICD-10-CM

## 2016-02-02 DIAGNOSIS — M8580 Other specified disorders of bone density and structure, unspecified site: Secondary | ICD-10-CM | POA: Diagnosis not present

## 2016-02-02 DIAGNOSIS — E782 Mixed hyperlipidemia: Secondary | ICD-10-CM

## 2016-02-02 LAB — CBC WITH DIFFERENTIAL/PLATELET
BASOS ABS: 0 {cells}/uL (ref 0–200)
BASOS PCT: 0 %
EOS ABS: 35 {cells}/uL (ref 15–500)
Eosinophils Relative: 1 %
HCT: 38.4 % (ref 35.0–45.0)
Hemoglobin: 12.8 g/dL (ref 11.7–15.5)
LYMPHS PCT: 24 %
Lymphs Abs: 840 cells/uL — ABNORMAL LOW (ref 850–3900)
MCH: 33.5 pg — AB (ref 27.0–33.0)
MCHC: 33.3 g/dL (ref 32.0–36.0)
MCV: 100.5 fL — AB (ref 80.0–100.0)
MONOS PCT: 15 %
MPV: 10.1 fL (ref 7.5–12.5)
Monocytes Absolute: 525 cells/uL (ref 200–950)
NEUTROS PCT: 60 %
Neutro Abs: 2100 cells/uL (ref 1500–7800)
PLATELETS: 211 10*3/uL (ref 140–400)
RBC: 3.82 MIL/uL (ref 3.80–5.10)
RDW: 13.8 % (ref 11.0–15.0)
WBC: 3.5 10*3/uL — ABNORMAL LOW (ref 3.8–10.8)

## 2016-02-02 LAB — LIPID PANEL
Cholesterol: 158 mg/dL (ref 125–200)
HDL: 78 mg/dL (ref >=46)
LDL CALC: 67 mg/dL (ref <130)
TRIGLYCERIDES: 63 mg/dL (ref <150)
Total CHOL/HDL Ratio: 2 Ratio (ref <=5.0)
VLDL: 13 mg/dL (ref <30)

## 2016-02-02 LAB — COMPLETE METABOLIC PANEL WITH GFR
ALT: 12 U/L (ref 6–29)
AST: 13 U/L (ref 10–35)
Albumin: 4.3 g/dL (ref 3.6–5.1)
Alkaline Phosphatase: 58 U/L (ref 33–130)
BUN: 12 mg/dL (ref 7–25)
CHLORIDE: 106 mmol/L (ref 98–110)
CO2: 30 mmol/L (ref 20–31)
CREATININE: 0.57 mg/dL (ref 0.50–1.05)
Calcium: 9.4 mg/dL (ref 8.6–10.4)
Glucose, Bld: 77 mg/dL (ref 65–99)
Potassium: 4 mmol/L (ref 3.5–5.3)
Sodium: 142 mmol/L (ref 135–146)
Total Bilirubin: 0.5 mg/dL (ref 0.2–1.2)
Total Protein: 6.6 g/dL (ref 6.1–8.1)

## 2016-02-02 MED ORDER — TOLTERODINE TARTRATE ER 2 MG PO CP24: 2.0000 mg | ORAL_CAPSULE | Freq: Every day | ORAL | 11 refills | Status: DC

## 2016-02-02 NOTE — Assessment & Plan Note (Signed)
Relapsing remitting type. Pt is on Tecfidera and Ampyra BID. Unclear the last time she saw neurology. MRI in 02/2015. Appears to be managed by [facility]- Dr. Malvin Johns.

## 2016-02-02 NOTE — Assessment & Plan Note (Signed)
Stable no breathing issues. Will do spirometry next month.

## 2016-02-02 NOTE — Assessment & Plan Note (Signed)
Last DXA scan on July of 2017. Osteopenia only. Had previously been on Fosamax for many years. Will stop today. Encouraged vitamin D daily and dietary calcium. Plan for DXA scan next year.

## 2016-02-02 NOTE — Assessment & Plan Note (Signed)
Change to tolterdine LA to see if symptoms can be improved. Will recheck next month.

## 2016-02-02 NOTE — Assessment & Plan Note (Addendum)
Recheck Vitamin D levels today. Continue supplementation for her osteopenia.

## 2016-02-02 NOTE — Patient Instructions (Addendum)
STOP taking the Fosamax for osteoporosis. Just take vitamin D supplements and get plenty of calcium in your diet.   Please call [facility] to order your mammogram.  We will send the Cologuard to your house to screen for colon cancer.   Health Maintenance, Female Adopting a healthy lifestyle and getting preventive care can go a [place] way to promote health and wellness. Talk with your health care provider about what schedule of regular examinations is right for you. This is a good chance for you to check in with your provider about disease prevention and staying healthy. In between checkups, there are plenty of things you can do on your own. Experts have done a lot of research about which lifestyle changes and preventive measures are most likely to keep you healthy. Ask your health care provider for more information. WEIGHT AND DIET  Eat a healthy diet  Be sure to include plenty of vegetables, fruits, low-fat dairy products, and lean protein.  Do not eat a lot of foods high in solid fats, added sugars, or salt.  Get regular exercise. This is one of the most important things you can do for your health.  Most adults should exercise for at least 150 minutes each week. The exercise should increase your heart rate and make you sweat (moderate-intensity exercise).  Most adults should also do strengthening exercises at least twice a week. This is in addition to the moderate-intensity exercise.  Maintain a healthy weight  Body mass index (BMI) is a measurement that can be used to identify possible weight problems. It estimates body fat based on height and weight. Your health care provider can help determine your BMI and help you achieve or maintain a healthy weight.  For females 25 years of age and older:   A BMI below 18.5 is considered underweight.  A BMI of 18.5 to 24.9 is normal.  A BMI of 25 to 29.9 is considered overweight.  A BMI of 30 and above is considered obese.  Watch  levels of cholesterol and blood lipids  You should start having your blood tested for lipids and cholesterol at 59 years of age, then have this test every 5 years.  You may need to have your cholesterol levels checked more often if:  Your lipid or cholesterol levels are high.  You are older than 59 years of age.  You are at high risk for heart disease.  CANCER SCREENING   Lung Cancer  Lung cancer screening is recommended for adults 12-47 years old who are at high risk for lung cancer because of a history of smoking.  A yearly low-dose CT scan of the lungs is recommended for people who:  Currently smoke.  Have quit within the past 15 years.  Have at least a 30-pack-year history of smoking. A pack year is smoking an average of one pack of cigarettes a day for 1 year.  Yearly screening should continue until it has been 15 years since you quit.  Yearly screening should stop if you develop a health problem that would prevent you from having lung cancer treatment.  Breast Cancer  Practice breast self-awareness. This means understanding how your breasts normally appear and feel.  It also means doing regular breast self-exams. Let your health care provider know about any changes, no matter how small.  If you are in your 20s or 30s, you should have a clinical breast exam (CBE) by a health care provider every 1-3 years as part of a  regular health exam.  If you are 40 or older, have a CBE every year. Also consider having a breast X-ray (mammogram) every year.  If you have a family history of breast cancer, talk to your health care provider about genetic screening.  If you are at high risk for breast cancer, talk to your health care provider about having an MRI and a mammogram every year.  Breast cancer gene (BRCA) assessment is recommended for women who have family members with BRCA-related cancers. BRCA-related cancers include:  Breast.  Ovarian.  Tubal.  Peritoneal  cancers.  Results of the assessment will determine the need for genetic counseling and BRCA1 and BRCA2 testing. Cervical Cancer Your health care provider may recommend that you be screened regularly for cancer of the pelvic organs (ovaries, uterus, and vagina). This screening involves a pelvic examination, including checking for microscopic changes to the surface of your cervix (Pap test). You may be encouraged to have this screening done every 3 years, beginning at age 64.  For women ages 9-65, health care providers may recommend pelvic exams and Pap testing every 3 years, or they may recommend the Pap and pelvic exam, combined with testing for human papilloma virus (HPV), every 5 years. Some types of HPV increase your risk of cervical cancer. Testing for HPV may also be done on women of any age with unclear Pap test results.  Other health care providers may not recommend any screening for nonpregnant women who are considered low risk for pelvic cancer and who do not have symptoms. Ask your health care provider if a screening pelvic exam is right for you.  If you have had past treatment for cervical cancer or a condition that could lead to cancer, you need Pap tests and screening for cancer for at least 20 years after your treatment. If Pap tests have been discontinued, your risk factors (such as having a new sexual partner) need to be reassessed to determine if screening should resume. Some women have medical problems that increase the chance of getting cervical cancer. In these cases, your health care provider may recommend more frequent screening and Pap tests. Colorectal Cancer  This type of cancer can be detected and often prevented.  Routine colorectal cancer screening usually begins at 59 years of age and continues through 59 years of age.  Your health care provider may recommend screening at an earlier age if you have risk factors for colon cancer.  Your health care provider may also  recommend using home test kits to check for hidden blood in the stool.  A small camera at the end of a tube can be used to examine your colon directly (sigmoidoscopy or colonoscopy). This is done to check for the earliest forms of colorectal cancer.  Routine screening usually begins at age 64.  Direct examination of the colon should be repeated every 5-10 years through 59 years of age. However, you may need to be screened more often if early forms of precancerous polyps or small growths are found. Skin Cancer  Check your skin from head to toe regularly.  Tell your health care provider about any new moles or changes in moles, especially if there is a change in a mole's shape or color.  Also tell your health care provider if you have a mole that is larger than the size of a pencil eraser.  Always use sunscreen. Apply sunscreen liberally and repeatedly throughout the day.  Protect yourself by wearing Bies sleeves, pants, a wide-brimmed hat,  and sunglasses whenever you are outside. HEART DISEASE, DIABETES, AND HIGH BLOOD PRESSURE   High blood pressure causes heart disease and increases the risk of stroke. High blood pressure is more likely to develop in:  People who have blood pressure in the high end of the normal range (130-139/85-89 mm Hg).  People who are overweight or obese.  People who are African American.  If you are 75-94 years of age, have your blood pressure checked every 3-5 years. If you are 32 years of age or older, have your blood pressure checked every year. You should have your blood pressure measured twice--once when you are at a hospital or clinic, and once when you are not at a hospital or clinic. Record the average of the two measurements. To check your blood pressure when you are not at a hospital or clinic, you can use:  An automated blood pressure machine at a pharmacy.  A home blood pressure monitor.  If you are between 41 years and 23 years old, ask your health  care provider if you should take aspirin to prevent strokes.  Have regular diabetes screenings. This involves taking a blood sample to check your fasting blood sugar level.  If you are at a normal weight and have a low risk for diabetes, have this test once every three years after 59 years of age.  If you are overweight and have a high risk for diabetes, consider being tested at a younger age or more often. PREVENTING INFECTION  Hepatitis B  If you have a higher risk for hepatitis B, you should be screened for this virus. You are considered at high risk for hepatitis B if:  You were born in a country where hepatitis B is common. Ask your health care provider which countries are considered high risk.  Your parents were born in a high-risk country, and you have not been immunized against hepatitis B (hepatitis B vaccine).  You have HIV or AIDS.  You use needles to inject street drugs.  You live with someone who has hepatitis B.  You have had sex with someone who has hepatitis B.  You get hemodialysis treatment.  You take certain medicines for conditions, including cancer, organ transplantation, and autoimmune conditions. Hepatitis C  Blood testing is recommended for:  Everyone born from [birth year] through 1964.  Anyone with known risk factors for hepatitis C. Sexually transmitted infections (STIs)  You should be screened for sexually transmitted infections (STIs) including gonorrhea and chlamydia if:  You are sexually active and are younger than 59 years of age.  You are older than 59 years of age and your health care provider tells you that you are at risk for this type of infection.  Your sexual activity has changed since you were last screened and you are at an increased risk for chlamydia or gonorrhea. Ask your health care provider if you are at risk.  If you do not have HIV, but are at risk, it may be recommended that you take a prescription medicine daily to prevent HIV  infection. This is called pre-exposure prophylaxis (PrEP). You are considered at risk if:  You are sexually active and do not regularly use condoms or know the HIV status of your partner(s).  You take drugs by injection.  You are sexually active with a partner who has HIV. Talk with your health care provider about whether you are at high risk of being infected with HIV. If you choose to begin PrEP, you should  first be tested for HIV. You should then be tested every 3 months for as [place] as you are taking PrEP.  PREGNANCY   If you are premenopausal and you may become pregnant, ask your health care provider about preconception counseling.  If you may become pregnant, take 400 to 800 micrograms (mcg) of folic acid every day.  If you want to prevent pregnancy, talk to your health care provider about birth control (contraception). OSTEOPOROSIS AND MENOPAUSE   Osteoporosis is a disease in which the bones lose minerals and strength with aging. This can result in serious bone fractures. Your risk for osteoporosis can be identified using a bone density scan.  If you are 69 years of age or older, or if you are at risk for osteoporosis and fractures, ask your health care provider if you should be screened.  Ask your health care provider whether you should take a calcium or vitamin D supplement to lower your risk for osteoporosis.  Menopause may have certain physical symptoms and risks.  Hormone replacement therapy may reduce some of these symptoms and risks. Talk to your health care provider about whether hormone replacement therapy is right for you.  HOME CARE INSTRUCTIONS   Schedule regular health, dental, and eye exams.  Stay current with your immunizations.   Do not use any tobacco products including cigarettes, chewing tobacco, or electronic cigarettes.  If you are pregnant, do not drink alcohol.  If you are breastfeeding, limit how much and how often you drink alcohol.  Limit  alcohol intake to no more than 1 drink per day for nonpregnant women. One drink equals 12 ounces of beer, 5 ounces of wine, or 1 ounces of hard liquor.  Do not use street drugs.  Do not share needles.  Ask your health care provider for help if you need support or information about quitting drugs.  Tell your health care provider if you often feel depressed.  Tell your health care provider if you have ever been abused or do not feel safe at home.   This information is not intended to replace advice given to you by your health care provider. Make sure you discuss any questions you have with your health care provider.   Document Released: 12/24/2010 Document Revised: 07/01/2014 Document Reviewed: 05/12/2013 Elsevier Interactive Patient Education [employer].

## 2016-02-02 NOTE — Assessment & Plan Note (Signed)
Incontinence supplies renewed until May 2018. Encouraged Kegel exercises.

## 2016-02-02 NOTE — Progress Notes (Signed)
Subjective:    Patient ID: [name], female    DOB: 1955-11-25, 60 y.o.   MRN: [id number]  HPI: [name] is a 60 y.o. female presenting on 02/02/2016 for Annual Exam   HPI  Pt presents for annual exam and refill of paperwork. Overall doing well at home. Pt has MS and poor intellectual functioning. She is a poor historian. Has a caregiver at home who helps her. Is not present today. [name] is in the home 5 days per week. Otherwise the patient is alone. She does report the [facility] nurse came out to the home yesterday to ask her questions.  Dr. Malvin Johns is managing MS- she is on Tecfidera and Ampyra BID. Unsure of the last time she saw Dr. Malvin Johns. Per care everywhere 03/09/2015. Has urinary incontinence associated with her MS and neurogenic bladder. Wears incontinence pads to help. Has some urge incontinence. Does have the sensation to void.   Health maintenance: Bone density- Has been fosamax for many years but a bone density scan last year showed osteopenia only with low fracture index.  Needs mammogram. Avoid Zoster 2/2 MS and possible immunosuppressive therapy.  Prevnar not needed until age 24. Pt reports she had a pneumonia vaccination in the past.    Past Medical History:  Diagnosis Date  . Depression   . Headache   . Hyperlipidemia   . MS (multiple sclerosis) (HCC) 1990  . Neurogenic bladder   . Osteoporosis    Social History   Social History  . Marital status: Divorced    Spouse name: N/A  . Number of children: N/A  . Years of education: N/A   Occupational History  . Not on file.   Social History Main Topics  . Smoking status: Former Smoker    Packs/day: 1.00    Years: 20.00    Quit date: 01/03/1995  . Smokeless tobacco: Former Neurosurgeon  . Alcohol use No  . Drug use:      Comment: smokes pods  . Sexual activity: Not on file   Other Topics Concern  . Not on file   Social History Narrative  . No narrative on file   Family History  Problem Relation Age of  Onset  . Diabetes Mother   . Heart disease Mother   . Diabetes Father   . Heart disease Father   . Diabetes Sister   . Heart disease Sister    Current Outpatient Prescriptions on File Prior to Visit  Medication Sig  . Cholecalciferol (VITAMIN D3) 2000 units TABS TAKE ONE (1) TABLET EACH DAY  . fluconazole (DIFLUCAN) 150 MG tablet Take 1 tablet (150 mg total) by mouth once.  . gabapentin (NEURONTIN) 300 MG capsule Take 300 mg by mouth 3 (three) times daily.  . meloxicam (MOBIC) 7.5 MG tablet Take 7.5 mg by mouth 2 (two) times daily.  . Misc. Devices (CANE) MISC 1 each by Does not apply route as needed.  . pantoprazole (PROTONIX) 40 MG tablet Take 1 tablet (40 mg total) by mouth daily.  . pravastatin (PRAVACHOL) 40 MG tablet TAKE ONE (1) TABLET BY MOUTH EVERY DAY  . PROAIR HFA 108 (90 Base) MCG/ACT inhaler USE 2 PUFFS EVERY SIX HOURS AS NEEDED FOR WHEEZING OR SHORTNESS OF BREATH  . tiotropium (SPIRIVA) 18 MCG inhalation capsule Place 1 capsule (18 mcg total) into inhaler and inhale daily.   No current facility-administered medications on file prior to visit.     Review of Systems  Constitutional: Negative for  chills and fever.  HENT: Negative.   Respiratory: Negative for cough, chest tightness and wheezing.   Cardiovascular: Negative for chest pain and leg swelling.  Gastrointestinal: Negative for abdominal pain, constipation, diarrhea, nausea and vomiting.  Endocrine: Negative.  Negative for cold intolerance, heat intolerance, polydipsia, polyphagia and polyuria.  Genitourinary: Positive for urgency. Negative for difficulty urinating and dysuria.       Incontinence.   Musculoskeletal: Negative.   Neurological: Negative for dizziness, light-headedness and numbness.  Psychiatric/Behavioral: Negative.    Per HPI unless specifically indicated above     Objective:    BP 130/79 (BP Location: Left Arm, Patient Position: Sitting, Cuff Size: Normal)   Pulse 67   Temp 98.3 F (36.8  C) (Oral)   Resp 16   Ht 5\' 5"  (1.651 m)   Wt 141 lb (64 kg)   BMI 23.46 kg/m   Wt Readings from Last 3 Encounters:  02/02/16 141 lb (64 kg)  05/29/15 160 lb (72.6 kg)  01/03/15 165 lb 6.4 oz (75 kg)    Physical Exam  Constitutional: She is oriented to person, place, and time. She appears well-developed and well-nourished.  HENT:  Head: Normocephalic and atraumatic.  Neck: Neck supple.  Cardiovascular: Normal rate, regular rhythm and normal heart sounds.  Exam reveals no gallop and no friction rub.   No murmur heard. Pulmonary/Chest: Effort normal and breath sounds normal. She has no wheezes. She has no rales. She exhibits no tenderness. Right breast exhibits no inverted nipple, no mass, no nipple discharge, no skin change and no tenderness. Left breast exhibits no inverted nipple, no mass, no nipple discharge, no skin change and no tenderness.  Abdominal: Soft. Normal appearance and bowel sounds are normal. She exhibits no distension and no mass. There is no tenderness. There is no rebound and no guarding.  Musculoskeletal: Normal range of motion. She exhibits no edema or tenderness.  Lymphadenopathy:    She has no cervical adenopathy.  Neurological: She is alert and oriented to person, place, and time.  Skin: Skin is warm and dry.   Results for orders placed or performed in visit on 05/29/15  POCT Wet Prep [facility])  Result Value Ref Range   Source Wet Prep POC     WBC, Wet Prep HPF POC few    Bacteria Wet Prep HPF POC Few None, Few, Too numerous to count   BACTERIA WET PREP MORPHOLOGY POC     Clue Cells Wet Prep HPF POC None None, Too numerous to count   Clue Cells Wet Prep Whiff POC     Yeast Wet Prep HPF POC Many    KOH Wet Prep POC neg    Trichomonas Wet Prep HPF POC none       Assessment & Plan:   Problem List Items Addressed This Visit      Respiratory   COPD (chronic obstructive pulmonary disease) (HCC)    Stable no breathing issues. Will do spirometry next  month.         Nervous and Auditory   MS (multiple sclerosis) (HCC)    Relapsing remitting type. Pt is on Tecfidera and Ampyra BID. Unclear the last time she saw neurology. MRI in 02/2015. Appears to be managed by [facility]- Dr. Malvin Johns.       Relevant Medications   Dimethyl Fumarate (TECFIDERA) 240 MG CPDR   dalfampridine (AMPYRA) 10 MG TB12   Other Relevant Orders   COMPLETE METABOLIC PANEL WITH GFR   CBC with Differential  Musculoskeletal and Integument   Osteopenia    Last DXA scan on July of 2017. Osteopenia only. Had previously been on Fosamax for many years. Will stop today. Encouraged vitamin D daily and dietary calcium. Plan for DXA scan next year.         Other   Mixed hyperlipidemia   Relevant Orders   Lipid Profile   Neurogenic bladder    Change to tolterdine LA to see if symptoms can be improved. Will recheck next month.       Relevant Medications   tolterodine (DETROL LA) 2 MG 24 hr capsule   Incontinence in female    Incontinence supplies renewed until May 2018. Encouraged Kegel exercises.       Relevant Medications   tolterodine (DETROL LA) 2 MG 24 hr capsule   Vitamin D deficiency    Recheck Vitamin D levels today. Continue supplementation for her osteopenia.       Relevant Orders   VITAMIN D 25 Hydroxy (Vit-D Deficiency, Fractures)    Other Visit Diagnoses    Well woman exam (no gynecological exam)    -  Primary   Screening for colon cancer       Relevant Orders   Cologuard   Screening for breast cancer       Relevant Orders   MM Digital Screening      Meds ordered this encounter  Medications  . Dimethyl Fumarate (TECFIDERA) 240 MG CPDR    Sig: Take 240 mg by mouth 2 (two) times daily.  [name] Kitchen dalfampridine (AMPYRA) 10 MG TB12    Sig: Take 10 mg by mouth 2 (two) times daily.  [name] Kitchen tolterodine (DETROL LA) 2 MG 24 hr capsule    Sig: Take 1 capsule (2 mg total) by mouth daily.    Dispense:  30 capsule    Refill:  11    Order Specific  Question:   Supervising Provider    Answer:   Janeann Forehand [phone]      Follow up plan: Return in about 1 month (around 03/04/2016), or if symptoms worsen or fail to improve.

## 2016-02-03 LAB — VITAMIN D 25 HYDROXY (VIT D DEFICIENCY, FRACTURES): Vit D, 25-Hydroxy: 33 ng/mL (ref 30–100)

## 2016-02-05 ENCOUNTER — Telehealth: Payer: Self-pay | Admitting: Family Medicine

## 2016-02-05 NOTE — Telephone Encounter (Signed)
Reviewed labs with care giver. White count has decreased. Pt is on immune modulating therapy for MS. Have made an appt for her on Wednesday at 945am to see Dr. Malvin Johns for a check-up. Caregiver verbalized understanding.

## 2016-02-09 ENCOUNTER — Other Ambulatory Visit: Payer: Self-pay | Admitting: Family Medicine

## 2016-02-09 DIAGNOSIS — R32 Unspecified urinary incontinence: Secondary | ICD-10-CM

## 2016-02-09 MED ORDER — MIRABEGRON ER 25 MG PO TB24: 25.0000 mg | ORAL_TABLET | Freq: Every day | ORAL | 11 refills | Status: DC

## 2016-02-13 ENCOUNTER — Telehealth: Payer: Self-pay | Admitting: *Deleted

## 2016-02-13 NOTE — Telephone Encounter (Signed)
Tolterodine ER 2mg  capsule has been approved under Medicare Part D thru 06/23/16. [id number]

## 2016-03-01 ENCOUNTER — Telehealth: Payer: Self-pay | Admitting: Family Medicine

## 2016-03-01 NOTE — Telephone Encounter (Signed)
Pt home health aid called to get her an appt. She is having a burning when she urinates.  She has an appt on Monday but the aid is not sure if she can accompany her.  Alarm symptoms reviewed with patient and caregiver. To Er or urgent care over the weekend as needed.  Needs referral to podiatry.

## 2016-03-01 NOTE — Telephone Encounter (Signed)
[name]  with  [facility] called [phone]   She  Thinks that  Pt  May have  A UTI ,Yeast infection, also she is requesting  A  Referral  For pt to  go to a podiatry to have nails cut

## 2016-03-04 ENCOUNTER — Ambulatory Visit (INDEPENDENT_AMBULATORY_CARE_PROVIDER_SITE_OTHER): Payer: Medicare Other | Admitting: Family Medicine

## 2016-03-04 ENCOUNTER — Encounter: Payer: Self-pay | Admitting: Family Medicine

## 2016-03-04 VITALS — BP 122/79 | HR 66 | Temp 98.7°F | Resp 16 | Ht 65.0 in | Wt 141.0 lb

## 2016-03-04 DIAGNOSIS — Z23 Encounter for immunization: Secondary | ICD-10-CM

## 2016-03-04 DIAGNOSIS — B373 Candidiasis of vulva and vagina: Secondary | ICD-10-CM | POA: Diagnosis not present

## 2016-03-04 DIAGNOSIS — N898 Other specified noninflammatory disorders of vagina: Secondary | ICD-10-CM

## 2016-03-04 DIAGNOSIS — R824 Acetonuria: Secondary | ICD-10-CM | POA: Diagnosis not present

## 2016-03-04 DIAGNOSIS — B3731 Acute candidiasis of vulva and vagina: Secondary | ICD-10-CM

## 2016-03-04 LAB — POCT WET PREP (WET MOUNT)
Clue Cells Wet Prep Whiff POC: NEGATIVE
TRICHOMONAS WET PREP HPF POC: ABSENT
WBC WET PREP: NONE SEEN

## 2016-03-04 LAB — POCT URINALYSIS DIPSTICK
BILIRUBIN UA: NEGATIVE
Blood, UA: NEGATIVE
GLUCOSE UA: NEGATIVE
LEUKOCYTES UA: NEGATIVE
NITRITE UA: NEGATIVE
Protein, UA: NEGATIVE
Spec Grav, UA: 1.02
Urobilinogen, UA: 0.2
pH, UA: 5

## 2016-03-04 LAB — BASIC METABOLIC PANEL WITH GFR
BUN: 15 mg/dL (ref 7–25)
CHLORIDE: 104 mmol/L (ref 98–110)
CO2: 29 mmol/L (ref 20–31)
Calcium: 9.3 mg/dL (ref 8.6–10.4)
Creat: 0.47 mg/dL — ABNORMAL LOW (ref 0.50–1.05)
GFR, Est African American: >89 mL/min (ref >=60)
Glucose, Bld: 71 mg/dL (ref 65–99)
POTASSIUM: 4.2 mmol/L (ref 3.5–5.3)
SODIUM: 142 mmol/L (ref 135–146)

## 2016-03-04 MED ORDER — FLUCONAZOLE 150 MG PO TABS: 150.0000 mg | ORAL_TABLET | Freq: Once | ORAL | 0 refills | Status: AC

## 2016-03-04 NOTE — Progress Notes (Signed)
Subjective:    Patient ID: [name], female    DOB: 04/25/1956, 60 y.o.   MRN: [id number]  HPI: [name] is a 60 y.o. female presenting on 03/04/2016 for Urinary Tract Infection   HPI  Pt presents for possible UTI vs yeast infection. Her UA showed no signs of infection. Symptoms started with burning of labia when she voids. No dysuria. No pubic pain. No flank pain.  Burning in vagina started 2 weeks ago. Pt has been using feminine wipes and has noticed some improvement in symptoms. Unsure if there is discharge. Vagina feels irritated when she voids. She is incontinent of urine Urine showed ketones- pt reports she has not had much to drink today. Tries to drink mainly water.  She has noted the Mybetriq helps her hold her urine longer.  Pt desires flu shot today.   Past Medical History:  Diagnosis Date  . Depression   . Headache   . Hyperlipidemia   . MS (multiple sclerosis) (HCC) 1990  . Neurogenic bladder   . Osteoporosis     Current Outpatient Prescriptions on File Prior to Visit  Medication Sig  . Cholecalciferol (VITAMIN D3) 2000 units TABS TAKE ONE (1) TABLET EACH DAY  . dalfampridine (AMPYRA) 10 MG TB12 Take 10 mg by mouth 2 (two) times daily.  . Dimethyl Fumarate (TECFIDERA) 240 MG CPDR Take 240 mg by mouth 2 (two) times daily.  [name] Kitchen gabapentin (NEURONTIN) 300 MG capsule Take 300 mg by mouth 3 (three) times daily.  . meloxicam (MOBIC) 7.5 MG tablet Take 7.5 mg by mouth 2 (two) times daily.  . mirabegron ER (MYRBETRIQ) 25 MG TB24 tablet Take 1 tablet (25 mg total) by mouth daily.  . Misc. Devices (CANE) MISC 1 each by Does not apply route as needed.  . pantoprazole (PROTONIX) 40 MG tablet Take 1 tablet (40 mg total) by mouth daily.  . pravastatin (PRAVACHOL) 40 MG tablet TAKE ONE (1) TABLET BY MOUTH EVERY DAY  . PROAIR HFA 108 (90 Base) MCG/ACT inhaler USE 2 PUFFS EVERY SIX HOURS AS NEEDED FOR WHEEZING OR SHORTNESS OF BREATH  . tiotropium (SPIRIVA) 18 MCG inhalation  capsule Place 1 capsule (18 mcg total) into inhaler and inhale daily.   No current facility-administered medications on file prior to visit.     Review of Systems  Genitourinary: Positive for flank pain, frequency and vaginal discharge. Negative for dysuria.       Vaginal burning.    Per HPI unless specifically indicated above     Objective:    BP 122/79 (BP Location: Left Arm, Patient Position: Sitting, Cuff Size: Normal)   Pulse 66   Temp 98.7 F (37.1 C) (Oral)   Resp 16   Ht 5\' 5"  (1.651 m)   Wt 141 lb (64 kg)   BMI 23.46 kg/m   Wt Readings from Last 3 Encounters:  03/04/16 141 lb (64 kg)  02/02/16 141 lb (64 kg)  05/29/15 160 lb (72.6 kg)    Physical Exam  Constitutional: She appears well-developed and well-nourished.  Cardiovascular: Normal rate and regular rhythm.  Exam reveals no gallop and no friction rub.   No murmur heard. Pulmonary/Chest: Effort normal and breath sounds normal.  Abdominal: Soft. She exhibits no shifting dullness and no distension. There is no tenderness. There is no CVA tenderness.  Genitourinary: There is no tenderness, lesion or injury on the right labia. There is no rash, lesion or injury on the left labia. Cervix exhibits no  motion tenderness and no friability. Vaginal discharge (large amounts white discharge) found.  Genitourinary Comments: Large amounts of white discharge pooled in the vagina    Results for orders placed or performed in visit on 03/04/16  POCT urinalysis dipstick  Result Value Ref Range   Color, UA straw    Clarity, UA clear    Glucose, UA negative    Bilirubin, UA negative    Ketones, UA small    Spec Grav, UA 1.020    Blood, UA negative    pH, UA 5.0    Protein, UA negative    Urobilinogen, UA 0.2    Nitrite, UA negative    Leukocytes, UA Negative Negative  POCT Wet Prep (Wet Mount)  Result Value Ref Range   Source Wet Prep POC     WBC, Wet Prep HPF POC none seen    Bacteria Wet Prep HPF POC Few None, Few,  Too numerous to count   BACTERIA WET PREP MORPHOLOGY POC     Clue Cells Wet Prep HPF POC None None, Too numerous to count   Clue Cells Wet Prep Whiff POC Negative Whiff    Yeast Wet Prep HPF POC Many    KOH Wet Prep POC     Trichomonas Wet Prep HPF POC Absent Absent      Assessment & Plan:   Problem List Items Addressed This Visit    None    Visit Diagnoses    Vaginal irritation    -  Primary   Likely 2/2 incontinence pads and vaginal candida. Reviewed peri-care to prevent infections.    Relevant Orders   POCT urinalysis dipstick (Completed)   POCT Wet Prep [facility]([place]et [place]) (Completed)   Vaginal candida       Candida seen on wet mount. Treat with diflucan to help with symptoms. Reviewed hygiene. Reviewed alarm symptoms.    Relevant Medications   fluconazole (DIFLUCAN) 150 MG tablet   Other Relevant Orders   POCT Wet Prep (Wet [place]) (Completed)   Ketonuria       Likely 2/2 dehydration will check BMP and HgA1C to r/o source of urine ketones. Encouraged adequate hydration with water.    Relevant Orders   Hemoglobin A1c   BASIC METABOLIC PANEL WITH GFR   Need for influenza vaccination       Relevant Orders   Flu Vaccine QUAD 36+ mos PF IM (Fluarix & Fluzone Quad PF) (Completed)      Meds ordered this encounter  Medications  . DISCONTD: alendronate (FOSAMAX) 70 MG tablet    Sig: Take 70 mg by mouth once a week. Take with a full glass of water on an empty stomach.  . solifenacin (VESICARE) 5 MG tablet    Sig: Take 5 mg by mouth daily.  . fluconazole (DIFLUCAN) 150 MG tablet    Sig: Take 1 tablet (150 mg total) by mouth once.    Dispense:  1 tablet    Refill:  0    Order Specific Question:   Supervising Provider    Answer:   Janeann ForehandHAWKINS JR, JAMES H [[id number]]      Follow up plan: Return if symptoms worsen or fail to improve.

## 2016-03-04 NOTE — Patient Instructions (Addendum)

## 2016-03-05 LAB — HEMOGLOBIN A1C
Hgb A1c MFr Bld: 5.1 % (ref <5.7)
MEAN PLASMA GLUCOSE: 100 mg/dL

## 2016-03-25 ENCOUNTER — Other Ambulatory Visit: Payer: Self-pay | Admitting: Family Medicine

## 2016-03-25 DIAGNOSIS — E782 Mixed hyperlipidemia: Secondary | ICD-10-CM

## 2016-04-30 ENCOUNTER — Other Ambulatory Visit: Payer: Self-pay | Admitting: Family Medicine

## 2016-04-30 MED ORDER — SOLIFENACIN SUCCINATE 5 MG PO TABS: 5.0000 mg | ORAL_TABLET | Freq: Every day | ORAL | 2 refills | Status: DC

## 2016-05-07 ENCOUNTER — Ambulatory Visit (INDEPENDENT_AMBULATORY_CARE_PROVIDER_SITE_OTHER): Payer: Medicare Other | Admitting: Family Medicine

## 2016-05-07 ENCOUNTER — Encounter: Payer: Self-pay | Admitting: Family Medicine

## 2016-05-07 VITALS — BP 149/70 | HR 91 | Temp 99.1°F | Resp 16 | Ht 65.0 in | Wt 152.0 lb

## 2016-05-07 DIAGNOSIS — L0231 Cutaneous abscess of buttock: Secondary | ICD-10-CM | POA: Diagnosis not present

## 2016-05-07 MED ORDER — DOXYCYCLINE HYCLATE 100 MG PO CAPS: 100.0000 mg | ORAL_CAPSULE | Freq: Two times a day (BID) | ORAL | 0 refills | Status: DC

## 2016-05-07 MED ORDER — KETOROLAC TROMETHAMINE 30 MG/ML IJ SOLN: 60.0000 mg | Freq: Once | INTRAMUSCULAR | Status: DC

## 2016-05-07 MED ORDER — KETOROLAC TROMETHAMINE 60 MG/2ML IM SOLN
60.0000 mg | Freq: Once | INTRAMUSCULAR | Status: AC
  Administered 2016-05-07: 60 mg via INTRAMUSCULAR

## 2016-05-07 NOTE — Progress Notes (Signed)
Subjective:    Patient ID: [name], female    DOB: 03/14/1956, 60 y.o.   MRN: [id number]  [name] is a 60 y.o. female presenting on 05/07/2016 for Recurrent Skin Infections   Patient presents for a same day appointment.  HPI   BOIL, BUTTOCKS: - Reports acute problem with worsening boil on left buttocks for over 1 week now, unsure exact onset. Reports symptoms worsening with pain, some purulent foul smelling pus drainage and bleeding. She has had similar boils before in this region but it has been several years. Has not been treated with antibiotics recently. - Tried soaking in warm water and epsom salts with minor relief. Taking Ibuprofen and Tylenol without relief. - Also chronic history of multiple sclerosis with some muscle pain and stiffness, left side is worse, pain is treated with oxycodone for pain chronically per Pain Management but currently out, she has next apt with them on Friday - Denies any fevers/chills, sweats, nausea, vomiting, spreading rash or redness   Social History  Substance Use Topics  . Smoking status: Former Smoker    Packs/day: 1.00    Years: 20.00    Quit date: 01/03/1995  . Smokeless tobacco: Former NeurosurgeonUser  . Alcohol use No    Review of Systems Per HPI unless specifically indicated above     Objective:    BP (!) 149/70   Pulse 91   Temp 99.1 F (37.3 C) (Oral)   Resp 16   Ht 5\' 5"  (1.651 m)   Wt 152 lb (68.9 kg)   BMI 25.29 kg/m   Wt Readings from Last 3 Encounters:  05/07/16 152 lb (68.9 kg)  03/04/16 141 lb (64 kg)  02/02/16 141 lb (64 kg)    Physical Exam  Constitutional: She appears well-developed and well-nourished. No distress.  Chronically ill but currently non toxic, moderate discomfort due to pain in buttocks difficulty changing positions, has 4 point cane for assistance with ambulation  HENT:  Head: Normocephalic and atraumatic.  Mouth/Throat: Oropharynx is clear and moist.  Cardiovascular: Normal rate and  intact distal pulses.   Pulmonary/Chest: Effort normal.  Skin: Skin is warm and dry. No rash noted. She is not diaphoretic. No erythema.  Left mid gluteal cleft abscess with moderate area of firm induration roughly 3 x 3 region perhaps larger without significant soft fluctuance, no drainage on expression, open ulceration 1 x 1 cm on surface with some bloody drainage. Tender to touch, without extending erythema or redness.  Nursing note and vitals reviewed.  I have personally reviewed the following lab results from 03/04/16.  Results for orders placed or performed in visit on 03/04/16  Hemoglobin A1c  Result Value Ref Range   Hgb A1c MFr Bld 5.1 <5.7 %   Mean Plasma Glucose 100 mg/dL  BASIC METABOLIC PANEL WITH GFR  Result Value Ref Range   Sodium 142 135 - 146 mmol/L   Potassium 4.2 3.5 - 5.3 mmol/L   Chloride 104 98 - 110 mmol/L   CO2 29 20 - 31 mmol/L   Glucose, Bld 71 65 - 99 mg/dL   BUN 15 7 - 25 mg/dL   Creat 4.090.47 (L) 8.110.50 - 1.05 mg/dL   Calcium 9.3 8.6 - 91.410.4 mg/dL   GFR, Est African American >89 >=60 mL/min   GFR, Est Non African American >89 >=60 mL/min  POCT urinalysis dipstick  Result Value Ref Range   Color, UA straw    Clarity, UA clear  Glucose, UA negative    Bilirubin, UA negative    Ketones, UA small    Spec Grav, UA 1.020    Blood, UA negative    pH, UA 5.0    Protein, UA negative    Urobilinogen, UA 0.2    Nitrite, UA negative    Leukocytes, UA Negative Negative  POCT Wet Prep (Wet [place])  Result Value Ref Range   Source Wet Prep POC     WBC, Wet Prep HPF POC none seen    Bacteria Wet Prep HPF POC Few None, Few, Too numerous to count   BACTERIA WET PREP MORPHOLOGY POC     Clue Cells Wet Prep HPF POC None None, Too numerous to count   Clue Cells Wet Prep Whiff POC Negative Whiff    Yeast Wet Prep HPF POC Many    KOH Wet Prep POC     Trichomonas Wet Prep HPF POC Absent Absent      Assessment & Plan:   Problem List Items Addressed This Visit     Abscess, gluteal, left - Primary    Consistent with new acute left gluteal abscess with firm induration without surrounding cellulitis or any systemic symptoms. Complicated by history of MS with some prior recurrent abscess. No recent antibiotics. - Given Toradol 60mg  x 1 dose IM today for pain  Plan: 1. Discussed options given size and location of abscess and limitations with difficulty of positioning patient, decision to defer I&D today, and arrange for general surgery follow-up for more complete drainage. Contacted [facility], scheduled new patient visit for tomorrow 11/15 at 1:30pm with Dr Lemar Livings 2. Given rx Doxycycline 100mg  BID for 10 days to provide MRSA coverage - may discuss antibiotics with surgery tomorrow, if not able to fill 3. Warm tub soaks regularly to assist drainage 4. Return criteria if worsening, follow-up as needed      Relevant Medications   doxycycline (VIBRAMYCIN) 100 MG capsule   ketorolac (TORADOL) injection 60 mg (Completed) (Start on 05/07/2016  4:30 PM)      Meds ordered this encounter  Medications  . doxycycline (VIBRAMYCIN) 100 MG capsule    Sig: Take 1 capsule (100 mg total) by mouth 2 (two) times daily. For 10 days    Dispense:  20 capsule    Refill:  0  . DISCONTD: ketorolac (TORADOL) 30 MG/ML injection 60 mg  . ketorolac (TORADOL) injection 60 mg      Follow up plan: Return in about 2 weeks (around 05/21/2016), or if symptoms worsen or fail to improve, for abscess.  [name], DO [facility] 05/07/2016, 4:22 PM

## 2016-05-07 NOTE — Assessment & Plan Note (Signed)
Consistent with new acute left gluteal abscess with firm induration without surrounding cellulitis or any systemic symptoms. Complicated by history of MS with some prior recurrent abscess. No recent antibiotics. - Given Toradol 60mg  x 1 dose IM today for pain  Plan: 1. Discussed options given size and location of abscess and limitations with difficulty of positioning patient, decision to defer I&D today, and arrange for general surgery follow-up for more complete drainage. Contacted [facility], scheduled new patient visit for tomorrow 11/15 at 1:30pm with Dr Lemar Livings 2. Given rx Doxycycline 100mg  BID for 10 days to provide MRSA coverage - may discuss antibiotics with surgery tomorrow, if not able to fill 3. Warm tub soaks regularly to assist drainage 4. Return criteria if worsening, follow-up as needed

## 2016-05-07 NOTE — Patient Instructions (Addendum)
Thank you for coming in to clinic today.  1. For your Abscess, I am concerned based on the location that it is in it may be difficult to heal. It seems to be deeper and I am referring you to see General Surgeon for more definitive drainage and treatment. - Start antibiotics Doxycycline 100mg  twice a day for 10 days, if you are unable to get this rx before tomorrow, you can see the surgeon first. If you take it, make sure it is with a full glass of water, and stay upright for 30 min after, do not lay down after, it can irritate your throat and cause esophagitis  Scheduled appointment to see Dr Shara Blazing at 1:30pm tomorrow Wednesday 11/15  [facility] [address] [place],  [place]  [address] Phone: [phone]   2. Toradol injection today for pain, you can keep taking Tylenol 500mg  tabs take 2 per dose up to 3 times a day, and resume ibuprofen tomorrow as needed 600 to 800mg  3 times a day  Follow-up with Pain doctor as planned  Please schedule a follow-up appointment with Dr. Althea Charon in 2 weeks as needed after see general surgeons ' If you have any other questions or concerns, please feel free to call the clinic or send a message through MyChart. You may also schedule an earlier appointment if necessary.  [name], DO [facility], [place]

## 2016-05-08 ENCOUNTER — Ambulatory Visit (INDEPENDENT_AMBULATORY_CARE_PROVIDER_SITE_OTHER): Payer: Medicare Other | Admitting: General Surgery

## 2016-05-08 ENCOUNTER — Encounter: Payer: Self-pay | Admitting: General Surgery

## 2016-05-08 VITALS — BP 130/84 | HR 88 | Temp 97.6°F | Resp 12 | Ht 64.0 in | Wt 143.0 lb

## 2016-05-08 DIAGNOSIS — L0231 Cutaneous abscess of buttock: Secondary | ICD-10-CM | POA: Diagnosis not present

## 2016-05-08 NOTE — Addendum Note (Signed)
Addended by: Smitty CordsKARAMALEGOS, Nili Honda J on: 05/08/2016 11:14 AM   Modules accepted: Orders

## 2016-05-08 NOTE — Patient Instructions (Signed)
The patient is aware to call back for any questions or concerns.  

## 2016-05-08 NOTE — Progress Notes (Signed)
Patient ID: [name], female   DOB: 05/16/1956, 60 y.o.   MRN: [id number]  Chief Complaint  Patient presents with  . Abscess    HPI [name] is a 60 y.o. female.  Here today for evaluation of a possible left buttock abscess. She states she noticed it about 2 weeks ago because of swelling and pain. She states it is draining some since yesterday. She has not started the antibiotic as of yet. Denies constipation.  She is here today with [name], her son.  HPI  Past Medical History:  Diagnosis Date  . Depression   . Headache   . Hyperlipidemia   . MS (multiple sclerosis) (HCC) 1990  . Neurogenic bladder   . Osteoporosis     Past Surgical History:  Procedure Laterality Date  . ABDOMINAL HYSTERECTOMY    . OOPHORECTOMY Bilateral     Family History  Problem Relation Age of Onset  . Diabetes Mother   . Heart disease Mother   . Diabetes Father   . Heart disease Father   . Diabetes Sister   . Heart disease Sister     Social History Social History  Substance Use Topics  . Smoking status: Former Smoker    Packs/day: 1.00    Years: 20.00    Quit date: 01/03/1995  . Smokeless tobacco: Former NeurosurgeonUser  . Alcohol use No    Allergies  Allergen Reactions  . Penicillins Rash    Current Outpatient Prescriptions  Medication Sig Dispense Refill  . Cholecalciferol (VITAMIN D3) 2000 units TABS TAKE ONE (1) TABLET EACH DAY 30 tablet 11  . dalfampridine (AMPYRA) 10 MG TB12 Take 10 mg by mouth 2 (two) times daily.    . Dimethyl Fumarate (TECFIDERA) 240 MG CPDR Take 240 mg by mouth 2 (two) times daily.    [name] Kitchen. doxycycline (VIBRAMYCIN) 100 MG capsule Take 1 capsule (100 mg total) by mouth 2 (two) times daily. For 10 days 20 capsule 0  . gabapentin (NEURONTIN) 300 MG capsule Take 300 mg by mouth 3 (three) times daily.    . meloxicam (MOBIC) 7.5 MG tablet Take 7.5 mg by mouth 2 (two) times daily.    . mirabegron ER (MYRBETRIQ) 25 MG TB24 tablet Take 1 tablet (25 mg total) by mouth  daily. 30 tablet 11  . Misc. Devices (CANE) MISC 1 each by Does not apply route as needed. 1 each 0  . pantoprazole (PROTONIX) 40 MG tablet Take 1 tablet (40 mg total) by mouth daily. 30 tablet 11  . pravastatin (PRAVACHOL) 40 MG tablet TAKE ONE (1) TABLET EACH DAY 30 tablet 2  . PROAIR HFA 108 (90 Base) MCG/ACT inhaler USE 2 PUFFS EVERY SIX HOURS AS NEEDED FOR WHEEZING OR SHORTNESS OF BREATH 8.5 g 11  . solifenacin (VESICARE) 5 MG tablet Take 1 tablet (5 mg total) by mouth daily. 30 tablet 2  . tiotropium (SPIRIVA) 18 MCG inhalation capsule Place 1 capsule (18 mcg total) into inhaler and inhale daily. 30 capsule 11   No current facility-administered medications for this visit.     Review of Systems Review of Systems  Constitutional: Negative.   Respiratory: Negative.   Cardiovascular: Negative.     Blood pressure 130/84, pulse 88, temperature 97.6 F (36.4 C), temperature source Oral, resp. rate 12, height 5\' 4"  (1.626 m), weight 143 lb (64.9 kg), SpO2 99 %.  Physical Exam Physical Exam  Constitutional: She is oriented to person, place, and time. She appears well-developed and well-nourished.  HENT:  Mouth/Throat: Oropharynx is clear and moist.  Eyes: Conjunctivae are normal. No scleral icterus.  Neck: Neck supple.  Genitourinary:     Genitourinary Comments: Left gluteal abscess  Lymphadenopathy:    She has no cervical adenopathy.  Neurological: She is alert and oriented to person, place, and time.  Skin: Skin is warm and dry.  Psychiatric: Her behavior is normal.    Data Reviewed PCP notes of 05/07/2016.  Assessment    Left gluteal abscess.    Plan    While the patient has experienced some relief in her discomfort since spontaneous drainage, she is still quite uncomfortable. Incision and drainage was recommended and accepted.  The area was prepped with Betadine solution and 10 mL of 0.5% Xylocaine with 0.25% Marcaine with 1-200,000 of epinephrine instilled. The  area was then reprepped with Betadine solution and draped. A radial incision was made through the area where she had had spontaneous drainage. There was really scant purulent material rather thickened tissue which likely represents to fat necrosis. Culture was obtained and a portion of the skin and underlying inflammatory tissue was excised for histologic exam. The patient tolerated this very well. Scant bleeding. Dry dressing applied.  The patient will continue to use warm compresses/soaks. She will pick up the antibiotic prescribed by the PCP yesterday.    Follow up in one week. This information has been scribed by Vicki Daft RN, BSN,BC.   [name] 05/08/2016, 11:43 PM

## 2016-05-10 ENCOUNTER — Telehealth: Payer: Self-pay | Admitting: General Surgery

## 2016-05-10 NOTE — Telephone Encounter (Signed)
The patient was notified the biopsy did not show any evidence of malignancy. She reports significant improvement in her discomfort since drainage was undertaken.  She does report some residual soreness when she sits, but otherwise is near asymptomatic.  She did obtain her antibiotic prescription as requested by her PCP.  She will follow-up as scheduled on 05/14/2016

## 2016-05-14 ENCOUNTER — Ambulatory Visit: Payer: Medicare Other | Admitting: General Surgery

## 2016-05-14 LAB — ANAEROBIC AND AEROBIC CULTURE

## 2016-05-22 ENCOUNTER — Ambulatory Visit: Payer: Medicare Other | Admitting: General Surgery

## 2016-05-28 ENCOUNTER — Other Ambulatory Visit: Payer: Self-pay | Admitting: Family Medicine

## 2016-05-28 DIAGNOSIS — E782 Mixed hyperlipidemia: Secondary | ICD-10-CM

## 2016-06-20 ENCOUNTER — Encounter: Payer: Self-pay | Admitting: *Deleted

## 2016-06-25 ENCOUNTER — Other Ambulatory Visit: Payer: Self-pay | Admitting: Family Medicine

## 2016-06-25 DIAGNOSIS — E782 Mixed hyperlipidemia: Secondary | ICD-10-CM

## 2016-07-23 ENCOUNTER — Encounter: Payer: Self-pay | Admitting: Family Medicine

## 2016-07-23 ENCOUNTER — Ambulatory Visit (INDEPENDENT_AMBULATORY_CARE_PROVIDER_SITE_OTHER): Payer: Medicare Other | Admitting: Family Medicine

## 2016-07-23 VITALS — BP 108/72 | HR 74 | Temp 98.6°F | Resp 16 | Ht 64.0 in | Wt 164.0 lb

## 2016-07-23 DIAGNOSIS — M8589 Other specified disorders of bone density and structure, multiple sites: Secondary | ICD-10-CM

## 2016-07-23 DIAGNOSIS — J432 Centrilobular emphysema: Secondary | ICD-10-CM | POA: Diagnosis not present

## 2016-07-23 DIAGNOSIS — R32 Unspecified urinary incontinence: Secondary | ICD-10-CM | POA: Diagnosis not present

## 2016-07-23 DIAGNOSIS — N319 Neuromuscular dysfunction of bladder, unspecified: Secondary | ICD-10-CM

## 2016-07-23 DIAGNOSIS — G35 Multiple sclerosis: Secondary | ICD-10-CM

## 2016-07-23 DIAGNOSIS — K219 Gastro-esophageal reflux disease without esophagitis: Secondary | ICD-10-CM | POA: Diagnosis not present

## 2016-07-23 DIAGNOSIS — Z23 Encounter for immunization: Secondary | ICD-10-CM | POA: Diagnosis not present

## 2016-07-23 MED ORDER — ALENDRONATE SODIUM 70 MG PO TABS: 70.0000 mg | ORAL_TABLET | ORAL | 11 refills | Status: DC

## 2016-07-23 MED ORDER — TIOTROPIUM BROMIDE MONOHYDRATE 18 MCG IN CAPS: 18.0000 ug | ORAL_CAPSULE | Freq: Every day | RESPIRATORY_TRACT | 11 refills | Status: DC

## 2016-07-23 MED ORDER — PANTOPRAZOLE SODIUM 40 MG PO TBEC: 40.0000 mg | DELAYED_RELEASE_TABLET | Freq: Every day | ORAL | 11 refills | Status: DC

## 2016-07-23 MED ORDER — TOLTERODINE TARTRATE ER 2 MG PO CP24: 2.0000 mg | ORAL_CAPSULE | Freq: Every day | ORAL | 11 refills | Status: DC

## 2016-07-23 NOTE — Assessment & Plan Note (Signed)
Chronic relapsing remitting MS, currently on therapy per Neurology, with recent worsening Left leg symptoms with some foot drop and numbness intermittently - Followed by [facility] (Dr Malvin Johns) - Referral placed today to renew visits with Dr Malvin Johns and continue therapy for MS

## 2016-07-23 NOTE — Patient Instructions (Addendum)
Thank you for coming in to clinic today.  1. Refilled medicatoins today, Spiriva, Tolterodine, Alendronate, Pantoprazole, refills for up to 1 year - Notify if need other medicines refilled  2. Referral placed to return back to [facility] Neu[facility] Dr Malvin Johns - No change to MS medications today  Received TDap vaccine today, good for 10 years  You will be due for FASTING BLOOD WORK (no food or drink after midnight before, only water or coffee without cream/sugar on the morning of)  - Please go ahead and schedule a "Lab Only" visit in the morning at the clinic for lab draw in 6 months before next Annual Physical - Make sure Lab Only appointment is at least 1-2 weeks before your next appointment, so that results will be available  For Lab Results, once available within 2-3 days of blood draw, you can can log in to MyChart online to view your results and a brief explanation. Also, we can discuss results at next follow-up visit.  Please schedule a follow-up appointment with Dr. Althea Charon in 6 months for Annual Physical review labs  If you have any other questions or concerns, please feel free to call the clinic or send a message through MyChart. You may also schedule an earlier appointment if necessary.  [name], DO [facility], [place]

## 2016-07-23 NOTE — Assessment & Plan Note (Signed)
Stable Refill Spiriva, follow-up spirometry in future if needed

## 2016-07-23 NOTE — Assessment & Plan Note (Signed)
Stable, refilled protonix. Concern with known osteopenia, already on osteoporosis therapy with alendronate

## 2016-07-23 NOTE — Assessment & Plan Note (Signed)
Stable, last Dexa 12/2014, on alendronate, unclear duration, chart review comments suggest may have been on for few years, patient only admits to taking for past 1 year - Refilled alendronate for 1 year, will re-check DEXA at physical 6 months, pending results consider DC bisphosphonate for now

## 2016-07-23 NOTE — Progress Notes (Signed)
Subjective:    Patient ID: [name], female    DOB: [birth year], 61 y.o.   MRN: [id number]  [name] is a 61 y.o. female presenting on 07/23/2016 for Follow-up  History provided by patient, no caregivers or family present. She has confusion over purpose of follow-up today, and requested that I call her [facility] Nurse [name] [name]) #[phone]  HPI   Multiple Sclerosis, Chronic / Neurogenic Bladder - Chronic problem followed by [facility] (Dr Theora Master), last visit per chart review 02/2015 - Request per patient and [facility] nurse today for renewal of referral to return to Dr Malvin Johns for continued MS management - Today describes recent symptoms with Left leg worsening over past 1 month with numbness, tingling, weakness, and pain, difficulty lifting leg with ambulation, with dragging her leg and some foot drop - No recent MS med changes, continues on Ampyra, Tecfidera - Request refill Tolteradine, also on Myrbetriq, tolerating well  GERD - Request refill on Protonix 40, currently doing well without problem  COPD - Currently stable without recent problem or exac - Refilled Spiriva  Ostepenia, on DEXA 12/2014 - Currently taking Alendronate 70mg  weekly, tolerating well, request refill - Denies prior fracture or other complication  Social History  Substance Use Topics  . Smoking status: Former Smoker    Packs/day: 1.00    Years: 20.00    Quit date: 01/03/1995  . Smokeless tobacco: Former Neurosurgeon  . Alcohol use No    Review of Systems Per HPI unless specifically indicated above     Objective:    BP 108/72   Pulse 74   Temp 98.6 F (37 C) (Oral)   Resp 16   Ht 5\' 4"  (1.626 m)   Wt 164 lb (74.4 kg)   BMI 28.15 kg/m   Wt Readings from Last 3 Encounters:  07/23/16 164 lb (74.4 kg)  05/08/16 143 lb (64.9 kg)  05/07/16 152 lb (68.9 kg)    Physical Exam  Constitutional: She is oriented to person, place, and time. She appears well-developed and well-nourished.  No distress.  Chronically ill-appearing but currently well today, mostly comfortable, cooperative. Has rolling walker.  HENT:  Head: Normocephalic and atraumatic.  Mouth/Throat: Oropharynx is clear and moist.  Eyes: Conjunctivae are normal.  Cardiovascular: Normal rate, regular rhythm, normal heart sounds and intact distal pulses.   No murmur heard. Pulmonary/Chest: Effort normal and breath sounds normal. No respiratory distress. She has no wheezes. She has no rales.  Musculoskeletal: She exhibits no edema.  Low Back / Lower Extremity Inspection: Normal appearance, Large body habitus, no spinal deformity, symmetrical. Palpation: No tenderness over spinous processes. Stable bilateral lower back muscle spasm and hypertonicity ROM: Limited ROM due to stiffness and spasm, mostly preserved  Strength: Bilateral knee flex/ext 5/5, some weakness 4/5 on left ankle plantar flex/dorsiflex Neurovascular: intact distal sensation to light touch  Neurological: She is alert and oriented to person, place, and time.  Skin: Skin is warm and dry. She is not diaphoretic.  Psychiatric: She has a normal mood and affect. Her behavior is normal.  Nursing note and vitals reviewed.      Assessment & Plan:   Problem List Items Addressed This Visit    Osteopenia    Stable, last Dexa 12/2014, on alendronate, unclear duration, chart review comments suggest may have been on for few years, patient only admits to taking for past 1 year - Refilled alendronate for 1 year, will re-check DEXA at physical 6 months, pending  results consider DC bisphosphonate for now      Relevant Medications   alendronate (FOSAMAX) 70 MG tablet   Neurogenic bladder   Relevant Medications   tolterodine (DETROL LA) 2 MG 24 hr capsule   MS (multiple sclerosis) (HCC) - Primary    Chronic relapsing remitting MS, currently on therapy per Neurology, with recent worsening Left leg symptoms with some foot drop and numbness intermittently -  Followed by [facility] (Dr Malvin Johns) - Referral placed today to renew visits with Dr Malvin Johns and continue therapy for MS      Relevant Orders   Ambulatory referral to Neurology   Incontinence in female   Relevant Medications   tolterodine (DETROL LA) 2 MG 24 hr capsule   GERD without esophagitis    Stable, refilled protonix. Concern with known osteopenia, already on osteoporosis therapy with alendronate      Relevant Medications   pantoprazole (PROTONIX) 40 MG tablet   COPD (chronic obstructive pulmonary disease) (HCC)    Stable Refill Spiriva, follow-up spirometry in future if needed      Relevant Medications   tiotropium (SPIRIVA) 18 MCG inhalation capsule    Other Visit Diagnoses    Need for diphtheria-tetanus-pertussis (Tdap) vaccine       Relevant Orders   Tdap vaccine greater than or equal to 7yo IM (Completed)      Meds ordered this encounter  Medications  . pantoprazole (PROTONIX) 40 MG tablet    Sig: Take 1 tablet (40 mg total) by mouth daily.    Dispense:  30 tablet    Refill:  11  . alendronate (FOSAMAX) 70 MG tablet    Sig: Take 1 tablet (70 mg total) by mouth every 7 (seven) days. Take with a full glass of water on an empty stomach.    Dispense:  4 tablet    Refill:  11  . tolterodine (DETROL LA) 2 MG 24 hr capsule    Sig: Take 1 capsule (2 mg total) by mouth daily.    Dispense:  30 capsule    Refill:  11  . tiotropium (SPIRIVA) 18 MCG inhalation capsule    Sig: Place 1 capsule (18 mcg total) into inhaler and inhale daily.    Dispense:  30 capsule    Refill:  11      Follow up plan: Return in about 6 months (around 01/20/2017) for Annual Physical.  [name], DO [facility] 07/23/2016, 11:52 PM

## 2016-07-24 ENCOUNTER — Telehealth: Payer: Self-pay | Admitting: Family Medicine

## 2016-07-24 DIAGNOSIS — J432 Centrilobular emphysema: Secondary | ICD-10-CM

## 2016-07-24 MED ORDER — ALBUTEROL SULFATE HFA 108 (90 BASE) MCG/ACT IN AERS: INHALATION_SPRAY | RESPIRATORY_TRACT | 11 refills | Status: DC

## 2016-07-24 NOTE — Telephone Encounter (Signed)
Pt needs a refill on albuterol sent to [facility] pharmacy.  Her call back number is [phone]

## 2016-08-06 DIAGNOSIS — M545 Low back pain: Secondary | ICD-10-CM | POA: Diagnosis not present

## 2016-08-06 DIAGNOSIS — G35 Multiple sclerosis: Secondary | ICD-10-CM | POA: Diagnosis not present

## 2016-08-06 DIAGNOSIS — Z5181 Encounter for therapeutic drug level monitoring: Secondary | ICD-10-CM | POA: Diagnosis not present

## 2016-08-06 DIAGNOSIS — Z79891 Long term (current) use of opiate analgesic: Secondary | ICD-10-CM | POA: Diagnosis not present

## 2016-08-06 DIAGNOSIS — G894 Chronic pain syndrome: Secondary | ICD-10-CM | POA: Diagnosis not present

## 2016-10-29 DIAGNOSIS — G35 Multiple sclerosis: Secondary | ICD-10-CM | POA: Diagnosis not present

## 2016-10-29 DIAGNOSIS — M545 Low back pain: Secondary | ICD-10-CM | POA: Diagnosis not present

## 2016-10-29 DIAGNOSIS — G894 Chronic pain syndrome: Secondary | ICD-10-CM | POA: Diagnosis not present

## 2016-10-29 DIAGNOSIS — Z79891 Long term (current) use of opiate analgesic: Secondary | ICD-10-CM | POA: Diagnosis not present

## 2016-12-10 ENCOUNTER — Ambulatory Visit: Payer: Medicare Other | Admitting: Family Medicine

## 2017-02-05 ENCOUNTER — Other Ambulatory Visit: Payer: Medicare Other

## 2017-02-11 ENCOUNTER — Other Ambulatory Visit: Payer: Self-pay | Admitting: Family Medicine

## 2017-02-11 DIAGNOSIS — Z114 Encounter for screening for human immunodeficiency virus [HIV]: Secondary | ICD-10-CM

## 2017-02-11 DIAGNOSIS — M8589 Other specified disorders of bone density and structure, multiple sites: Secondary | ICD-10-CM

## 2017-02-11 DIAGNOSIS — G35 Multiple sclerosis: Secondary | ICD-10-CM

## 2017-02-11 DIAGNOSIS — R7309 Other abnormal glucose: Secondary | ICD-10-CM

## 2017-02-11 DIAGNOSIS — E782 Mixed hyperlipidemia: Secondary | ICD-10-CM

## 2017-02-11 DIAGNOSIS — R718 Other abnormality of red blood cells: Secondary | ICD-10-CM

## 2017-02-11 DIAGNOSIS — D539 Nutritional anemia, unspecified: Secondary | ICD-10-CM | POA: Insufficient documentation

## 2017-02-11 DIAGNOSIS — Z1159 Encounter for screening for other viral diseases: Secondary | ICD-10-CM

## 2017-02-11 DIAGNOSIS — E559 Vitamin D deficiency, unspecified: Secondary | ICD-10-CM

## 2017-02-12 ENCOUNTER — Other Ambulatory Visit: Payer: Medicare Other

## 2017-02-12 DIAGNOSIS — E559 Vitamin D deficiency, unspecified: Secondary | ICD-10-CM

## 2017-02-12 DIAGNOSIS — E782 Mixed hyperlipidemia: Secondary | ICD-10-CM | POA: Diagnosis not present

## 2017-02-12 DIAGNOSIS — G35 Multiple sclerosis: Secondary | ICD-10-CM

## 2017-02-12 DIAGNOSIS — M8589 Other specified disorders of bone density and structure, multiple sites: Secondary | ICD-10-CM

## 2017-02-12 DIAGNOSIS — R718 Other abnormality of red blood cells: Secondary | ICD-10-CM

## 2017-02-12 DIAGNOSIS — R7309 Other abnormal glucose: Secondary | ICD-10-CM | POA: Diagnosis not present

## 2017-02-12 DIAGNOSIS — Z1159 Encounter for screening for other viral diseases: Secondary | ICD-10-CM

## 2017-02-12 DIAGNOSIS — D539 Nutritional anemia, unspecified: Secondary | ICD-10-CM | POA: Diagnosis not present

## 2017-02-12 DIAGNOSIS — Z114 Encounter for screening for human immunodeficiency virus [HIV]: Secondary | ICD-10-CM

## 2017-02-12 LAB — CBC WITH DIFFERENTIAL/PLATELET
BASOS ABS: 0 {cells}/uL (ref 0–200)
Basophils Relative: 0 %
EOS PCT: 1 %
Eosinophils Absolute: 46 cells/uL (ref 15–500)
HEMATOCRIT: 43.4 % (ref 35.0–45.0)
HEMOGLOBIN: 14.4 g/dL (ref 11.7–15.5)
LYMPHS ABS: 782 {cells}/uL — AB (ref 850–3900)
Lymphocytes Relative: 17 %
MCH: 34 pg — AB (ref 27.0–33.0)
MCHC: 33.2 g/dL (ref 32.0–36.0)
MCV: 102.4 fL — ABNORMAL HIGH (ref 80.0–100.0)
MONO ABS: 414 {cells}/uL (ref 200–950)
MPV: 10.4 fL (ref 7.5–12.5)
Monocytes Relative: 9 %
NEUTROS ABS: 3358 {cells}/uL (ref 1500–7800)
Neutrophils Relative %: 73 %
Platelets: 194 10*3/uL (ref 140–400)
RBC: 4.24 MIL/uL (ref 3.80–5.10)
RDW: 13.7 % (ref 11.0–15.0)
WBC: 4.6 10*3/uL (ref 3.8–10.8)

## 2017-02-13 LAB — LIPID PANEL
Cholesterol: 173 mg/dL (ref <200)
HDL: 74 mg/dL (ref >50)
LDL Cholesterol: 90 mg/dL (ref <100)
TRIGLYCERIDES: 46 mg/dL (ref <150)
Total CHOL/HDL Ratio: 2.3 Ratio (ref <5.0)
VLDL: 9 mg/dL (ref <30)

## 2017-02-13 LAB — COMPLETE METABOLIC PANEL WITH GFR
ALBUMIN: 4.4 g/dL (ref 3.6–5.1)
ALK PHOS: 71 U/L (ref 33–130)
ALT: 10 U/L (ref 6–29)
AST: 14 U/L (ref 10–35)
BUN: 12 mg/dL (ref 7–25)
CHLORIDE: 103 mmol/L (ref 98–110)
CO2: 25 mmol/L (ref 20–32)
Calcium: 9.2 mg/dL (ref 8.6–10.4)
Creat: 0.49 mg/dL — ABNORMAL LOW (ref 0.50–0.99)
GFR, Est African American: >89 mL/min (ref >=60)
GLUCOSE: 70 mg/dL (ref 65–99)
POTASSIUM: 3.8 mmol/L (ref 3.5–5.3)
SODIUM: 141 mmol/L (ref 135–146)
Total Bilirubin: 0.4 mg/dL (ref 0.2–1.2)
Total Protein: 7 g/dL (ref 6.1–8.1)

## 2017-02-13 LAB — HIV ANTIBODY (ROUTINE TESTING W REFLEX): HIV 1&2 Ab, 4th Generation: NONREACTIVE

## 2017-02-13 LAB — FOLATE: FOLATE: 11.4 ng/mL (ref >5.4)

## 2017-02-13 LAB — VITAMIN B12: VITAMIN B 12: 535 pg/mL (ref 200–1100)

## 2017-02-13 LAB — VITAMIN D 25 HYDROXY (VIT D DEFICIENCY, FRACTURES): Vit D, 25-Hydroxy: 27 ng/mL — ABNORMAL LOW (ref 30–100)

## 2017-02-13 LAB — HEMOGLOBIN A1C
HEMOGLOBIN A1C: 5.1 % (ref <5.7)
MEAN PLASMA GLUCOSE: 100 mg/dL

## 2017-02-13 LAB — HEPATITIS C ANTIBODY: HCV Ab: NONREACTIVE

## 2017-02-19 ENCOUNTER — Encounter: Payer: Medicare Other | Admitting: Family Medicine

## 2017-02-25 ENCOUNTER — Ambulatory Visit (INDEPENDENT_AMBULATORY_CARE_PROVIDER_SITE_OTHER): Payer: Medicare Other | Admitting: Family Medicine

## 2017-02-25 ENCOUNTER — Encounter: Payer: Self-pay | Admitting: Family Medicine

## 2017-02-25 VITALS — BP 113/76 | HR 77 | Temp 98.3°F | Resp 16 | Ht 64.0 in | Wt 142.8 lb

## 2017-02-25 DIAGNOSIS — M8589 Other specified disorders of bone density and structure, multiple sites: Secondary | ICD-10-CM | POA: Diagnosis not present

## 2017-02-25 DIAGNOSIS — Z23 Encounter for immunization: Secondary | ICD-10-CM | POA: Diagnosis not present

## 2017-02-25 DIAGNOSIS — E559 Vitamin D deficiency, unspecified: Secondary | ICD-10-CM | POA: Diagnosis not present

## 2017-02-25 DIAGNOSIS — J432 Centrilobular emphysema: Secondary | ICD-10-CM | POA: Diagnosis not present

## 2017-02-25 DIAGNOSIS — K219 Gastro-esophageal reflux disease without esophagitis: Secondary | ICD-10-CM

## 2017-02-25 DIAGNOSIS — G894 Chronic pain syndrome: Secondary | ICD-10-CM | POA: Diagnosis not present

## 2017-02-25 DIAGNOSIS — E782 Mixed hyperlipidemia: Secondary | ICD-10-CM | POA: Diagnosis not present

## 2017-02-25 DIAGNOSIS — R32 Unspecified urinary incontinence: Secondary | ICD-10-CM

## 2017-02-25 DIAGNOSIS — N319 Neuromuscular dysfunction of bladder, unspecified: Secondary | ICD-10-CM

## 2017-02-25 DIAGNOSIS — Z1231 Encounter for screening mammogram for malignant neoplasm of breast: Secondary | ICD-10-CM | POA: Diagnosis not present

## 2017-02-25 DIAGNOSIS — G35 Multiple sclerosis: Secondary | ICD-10-CM

## 2017-02-25 DIAGNOSIS — Z1239 Encounter for other screening for malignant neoplasm of breast: Secondary | ICD-10-CM

## 2017-02-25 MED ORDER — GABAPENTIN 300 MG PO CAPS: 300.0000 mg | ORAL_CAPSULE | Freq: Three times a day (TID) | ORAL | 3 refills | Status: DC

## 2017-02-25 MED ORDER — ALENDRONATE SODIUM 70 MG PO TABS: 70.0000 mg | ORAL_TABLET | ORAL | 11 refills | Status: DC

## 2017-02-25 MED ORDER — VITAMIN D3 125 MCG (5000 UT) PO CAPS: 5000.0000 [IU] | ORAL_CAPSULE | Freq: Every day | ORAL | 0 refills | Status: DC

## 2017-02-25 MED ORDER — ALBUTEROL SULFATE (2.5 MG/3ML) 0.083% IN NEBU: 2.5000 mg | INHALATION_SOLUTION | Freq: Four times a day (QID) | RESPIRATORY_TRACT | 2 refills | Status: DC | PRN

## 2017-02-25 MED ORDER — MIRABEGRON ER 25 MG PO TB24: 25.0000 mg | ORAL_TABLET | Freq: Every day | ORAL | 11 refills | Status: DC

## 2017-02-25 NOTE — Patient Instructions (Addendum)
Thank you for coming to the clinic today.  1. Recommend call Dr Malvin Johns to schedule follow-up and request refill Muscle Relaxant - possibly Flexeril (Cyclobenzaprine) maybe other muscle relaxant  2. Increase Gabapentin - refilled 300mg  capsules - start adding slowly, take 1 in morning, 1 in afternoon and 2 in evening, and gradually increase over next few weeks, can take up to 2 pills for each dose, 3 times daily - if need new stronger pill 600mg  pill in future let me know  3. Make sure you are no longer taking Tolteradine for bladder, refilled Myrbetriq  For Mammogram screening for breast cancer  Call the Imaging Center below anytime to schedule your own appointment now that order has been placed.  [facility] [address] [place], [place] [address] Phone: [phone]  Likely next Colonoscopy in 2 years, will request record from [facility] from 2015  Referral to home health PT, stay tuned  Please schedule a Follow-up Appointment to: Return in about 6 months (around 08/25/2017) for MS, Pain, Med Adjust.  If you have any other questions or concerns, please feel free to call the clinic or send a message through MyChart. You may also schedule an earlier appointment if necessary.  Additionally, you may be receiving a survey about your experience at our clinic within a few days to 1 week by e-mail or mail. We value your feedback.  [name], DO [facility], [place]

## 2017-02-25 NOTE — Assessment & Plan Note (Signed)
Stable on Alendronate for 2 years on bisphosphonate Refill Alendronate Deferred repeat DEXA check within 1 year

## 2017-02-25 NOTE — Progress Notes (Addendum)
Subjective:    Patient ID: [name], female    DOB: April 15, 1956, 61 y.o.   MRN: [id number]  [name] is a 61 y.o. female presenting on 02/25/2017 for Annual Exam   HPI   Multiple Sclerosis, Chronic / Neurogenic Bladder / Chronic Pain Syndrome - Chronic problem followed by [facility] (Dr Gurney Maxin), last visit per chart review 02/2015 - She has not scheduled follow-up yet with Dr Melrose Nakayama - Previously followed by [facility] in [place] followed by Dr Sanjuan Dame (5/325, #75 for 30 day supply), last fill 01/08/17, however now reported issue with someone taking her rx and complication now she was discharged and cannot return - Today describes chronic problem with symptoms pain  numbness, tingling, weakness, and pain, difficulty lifting leg with ambulation, with dragging her leg and some foot drop - Uses 4 point cane regularly, asking about [facility] referral to resume PT, RN for now, usually needs q 6 months - No recent MS med changes, continues on Ampyra, Tecfidera - Request refill on Myrbetriq, off Tolteradine - Not taking Tylenol - Not taking any NSAID - Gabapentin 333m TID  COPD - Currently stable without recent problem or exac - Request refill Albuterol neb solution, seems to use increased amount of albuterol more regular not PRN - Still using SJarales on DEXA 12/2014 - Prior history on bisphosphonate, Alendronate 712mweekly previously for past 2 years, has been out now and requesting refill - Denies prior fracture or other complication  Vitamin D Insufficiency - Last lab recently 01/2017, Low, 27 - Was not taking Vitamin D anymore, was on 2k to [place] prior dose  Health Maintenance: - Due for Flu Shot, 2018 season, will get today - Due for Mammogram, requested order[facility], last done 06/06/14, have pathology report with tubular adenoma x 2 but no full colonoscopy  report, request record - Not indicated for pap smear s/p total hysterectomy  Past Medical History:  Diagnosis Date  . Depression   . Headache   . Hyperlipidemia   . [place]  . Neurogenic bladder   . Osteoporosis    Past Surgical History:  Procedure Laterality Date  . ABDOMINAL HYSTERECTOMY    . OOPHORECTOMY Bilateral    Social History   Social History  . Marital status: Divorced    Spouse name: N/A  . Number of children: N/A  . Years of education: N/A   Occupational History  . Not on file.   Social History Main Topics  . Smoking status: Current Some Day Smoker    Packs/day: 1.00    Years: 20.00    Last attempt to quit: 01/03/1995  . Smokeless tobacco: Current User  . Alcohol use No  . Drug use: Yes     Comment: smokes pods  . Sexual activity: Not on file   Other Topics Concern  . Not on file   Social History Narrative  . No narrative on file   Family History  Problem Relation Age of Onset  . Diabetes Mother   . Heart disease Mother   . Diabetes Father   . Heart disease Father   . Diabetes Sister   . Heart disease Sister    Current Outpatient Prescriptions on File Prior to Visit  Medication Sig  . albuterol (PROAIR HFA) 108 (90 Base) MCG/ACT inhaler USE 2 PUFFS EVERY SIX HOURS AS NEEDED  FOR WHEEZING OR SHORTNESS OF BREATH  . dalfampridine (AMPYRA) 10 MG TB12 Take 10 mg by mouth 2 (two) times daily.  . Dimethyl Fumarate (TECFIDERA) 240 MG CPDR Take 240 mg by mouth 2 (two) times daily.  . Misc. Devices (CANE) MISC 1 each by Does not apply route as needed.  . pantoprazole (PROTONIX) 40 MG tablet Take 1 tablet (40 mg total) by mouth daily.  . pravastatin (PRAVACHOL) 40 MG tablet TAKE 1 TABLET ONCE A DAY  . tiotropium (SPIRIVA) 18 MCG inhalation capsule Place 1 capsule (18 mcg total) into inhaler and inhale daily.   No current facility-administered medications on file prior to visit.     Review of Systems  Constitutional:  Negative for activity change, appetite change, chills, diaphoresis, fatigue, fever and unexpected weight change.  HENT: Negative for congestion, hearing loss and sinus pressure.   Eyes: Negative for visual disturbance.  Respiratory: Negative for apnea, cough, chest tightness, shortness of breath and wheezing.   Cardiovascular: Negative for chest pain, palpitations and leg swelling.  Gastrointestinal: Negative for abdominal pain, anal bleeding, blood in stool, constipation, diarrhea, nausea and vomiting.  Endocrine: Negative for cold intolerance and polyuria.  Genitourinary: Positive for difficulty urinating. Negative for decreased urine volume, dysuria, frequency and hematuria.  Musculoskeletal: Positive for arthralgias and back pain. Negative for neck pain.  Skin: Negative for rash.  Allergic/Immunologic: Negative for environmental allergies.  Neurological: Negative for dizziness, weakness, light-headedness, numbness and headaches.  Hematological: Negative for adenopathy.  Psychiatric/Behavioral: Negative for behavioral problems, dysphoric mood and sleep disturbance. The patient is not nervous/anxious.    Per HPI unless specifically indicated above     Objective:    BP 113/76   Pulse 77   Temp 98.3 F (36.8 C) (Oral)   Resp 16   Ht '5\' 4"'  (1.626 m)   Wt 142 lb 12.8 oz (64.8 kg)   BMI 24.51 kg/m   Wt Readings from Last 3 Encounters:  02/25/17 142 lb 12.8 oz (64.8 kg)  07/23/16 164 lb (74.4 kg)  05/08/16 143 lb (64.9 kg)    Physical Exam  Constitutional: She is oriented to person, place, and time. She appears well-developed and well-nourished. No distress.  Chronically ill-appearing but currently well today, mostly comfortable, cooperative. Has rolling walker.  HENT:  Head: Normocephalic and atraumatic.  Mouth/Throat: Oropharynx is clear and moist.  Eyes: Conjunctivae are normal. Right eye exhibits no discharge. Left eye exhibits no discharge.  Neck: Normal range of motion.  Neck supple.  Cardiovascular: Normal rate, regular rhythm, normal heart sounds and intact distal pulses.   No murmur heard. Pulmonary/Chest: Effort normal and breath sounds normal. No respiratory distress. She has no wheezes. She has no rales.  Abdominal: Soft. Bowel sounds are normal. She exhibits no distension. There is no tenderness.  Musculoskeletal: She exhibits no edema.  Low Back / Lower Extremity Inspection: Normal appearance, Large body habitus, no spinal deformity, symmetrical. Palpation: No tenderness over spinous processes. Stable bilateral lower back muscle spasm and hypertonicity ROM: Limited ROM due to stiffness and spasm, mostly preserved  Strength: Bilateral knee flex/ext 5/5, some weakness 4/5 on left ankle plantar flex/dorsiflex Neurovascular: intact distal sensation to light touch  Neurological: She is alert and oriented to person, place, and time.  Skin: Skin is warm and dry. No rash noted. She is not diaphoretic. No erythema.  Psychiatric: She has a normal mood and affect. Her behavior is normal.  Nursing note and vitals reviewed.  Results for orders placed or performed  in visit on 02/12/17  COMPLETE METABOLIC PANEL WITH GFR  Result Value Ref Range   Sodium 141 135 - 146 mmol/L   Potassium 3.8 3.5 - 5.3 mmol/L   Chloride 103 98 - 110 mmol/L   CO2 25 20 - 32 mmol/L   Glucose, Bld 70 65 - 99 mg/dL   BUN 12 7 - 25 mg/dL   Creat 0.49 (L) 0.50 - 0.99 mg/dL   Total Bilirubin 0.4 0.2 - 1.2 mg/dL   Alkaline Phosphatase 71 33 - 130 U/L   AST 14 10 - 35 U/L   ALT 10 6 - 29 U/L   Total Protein 7.0 6.1 - 8.1 g/dL   Albumin 4.4 3.6 - 5.1 g/dL   Calcium 9.2 8.6 - 10.4 mg/dL   GFR, Est African American >89 >=60 mL/min   GFR, Est Non African American >89 >=60 mL/min  CBC with Differential/Platelet  Result Value Ref Range   WBC 4.6 3.8 - 10.8 K/uL   RBC 4.24 3.80 - 5.10 MIL/uL   Hemoglobin 14.4 11.7 - 15.5 g/dL   HCT 43.4 35.0 - 45.0 %   MCV 102.4 (H) 80.0 - 100.0 fL    MCH 34.0 (H) 27.0 - 33.0 pg   MCHC 33.2 32.0 - 36.0 g/dL   RDW 13.7 11.0 - 15.0 %   Platelets 194 140 - 400 K/uL   MPV 10.4 7.5 - 12.5 fL   Neutro Abs 3,358 1,500 - 7,800 cells/uL   Lymphs Abs 782 (L) 850 - 3,900 cells/uL   Monocytes Absolute 414 200 - 950 cells/uL   Eosinophils Absolute 46 15 - 500 cells/uL   Basophils Absolute 0 0 - 200 cells/uL   Neutrophils Relative % 73 %   Lymphocytes Relative 17 %   Monocytes Relative 9 %   Eosinophils Relative 1 %   Basophils Relative 0 %   Smear Review Criteria for review not met   Hemoglobin A1c  Result Value Ref Range   Hgb A1c MFr Bld 5.1 <5.7 %   Mean Plasma Glucose 100 mg/dL  Lipid panel  Result Value Ref Range   Cholesterol 173 <200 mg/dL   Triglycerides 46 <150 mg/dL   HDL 74 >50 mg/dL   Total CHOL/HDL Ratio 2.3 <5.0 Ratio   VLDL 9 <30 mg/dL   LDL Cholesterol 90 <100 mg/dL  VITAMIN D 25 Hydroxy (Vit-D Deficiency, Fractures)  Result Value Ref Range   Vit D, 25-Hydroxy 27 (L) 30 - 100 ng/mL  Hepatitis C antibody  Result Value Ref Range   HCV Ab NON-REACTIVE NON-REACTIVE  HIV antibody  Result Value Ref Range   HIV 1&2 Ab, 4th Generation NONREACTIVE NONREACTIVE  Vitamin B12  Result Value Ref Range   Vitamin B-12 535 200 - 1,100 pg/mL  Folate  Result Value Ref Range   Folate 11.4 >5.4 ng/mL      Assessment & Plan:   Problem List Items Addressed This Visit    Vitamin D deficiency    Start Vitamin D3 5,000 iu weekly for 12 weeks then reduce to 2k      Relevant Medications   Cholecalciferol (VITAMIN D3) 5000 units CAPS   Osteopenia    Stable on Alendronate for 2 years on bisphosphonate Refill Alendronate Deferred repeat DEXA check within 1 year      Relevant Medications   alendronate (FOSAMAX) 70 MG tablet   Neurogenic bladder    Stable chronic problem Refilled Myrtbetriq Stop Detrol Follow-up with Urology if not improved  Relevant Medications   mirabegron ER (MYRBETRIQ) 25 MG TB24 tablet   MS  (multiple sclerosis) (HCC) - Primary    Chronic relapsing remitting MS, currently on therapy per Neurology, with recent worsening Left leg symptoms with some foot drop and numbness intermittently still - Followed by [facility] (Dr Melrose Nakayama) - needs to schedule apt - Referral to [facility] PT, RN - Increase Gabapentin      Relevant Medications   gabapentin (NEURONTIN) 300 MG capsule   Mixed hyperlipidemia    Stable on pravastatin      Incontinence in female    Refilled Myrbetriq Follow-up with Urology if not improved in future for neurogenic bladder      Relevant Medications   mirabegron ER (MYRBETRIQ) 25 MG TB24 tablet   GERD without esophagitis    Stable continue PPI, previous discussion Concern remains known osteopenia, continue management      COPD (chronic obstructive pulmonary disease) (HCC)    Stable Refilled Albuterol solution, counseled on only PRN use      Relevant Medications   albuterol (PROVENTIL) (2.5 MG/3ML) 0.083% nebulizer solution   Chronic pain    Stable chronic problem secondary to MS No longer on pain management oxycodone by [facility] due to problem with someone taking her rx Advised patient I do not plan to re-start her on chronic opiates Increase Gabapentin from 339m TID to 6029mTID titrate up Continue muscle relaxant per Neurology, needs to follow-up Increase Tylenol dosing      Relevant Medications   gabapentin (NEURONTIN) 300 MG capsule    Other Visit Diagnoses    Needs flu shot       Relevant Orders   Flu Vaccine QUAD 6+ mos PF IM (Fluarix Quad PF) (Completed)   Screening for breast cancer       Relevant Orders   MM DIGITAL SCREENING BILATERAL      Meds ordered this encounter  Medications  . Cholecalciferol (VITAMIN D3) 5000 units CAPS    Sig: Take 1 capsule (5,000 Units total) by mouth daily. For 12 weeks, then start Vitamin D3 2,000 units daily (OTC)    Dispense:  90 capsule    Refill:  0  . gabapentin (NEURONTIN) 300  MG capsule    Sig: Take 1-2 capsules (300-600 mg total) by mouth 3 (three) times daily. Gradually increase dose to max of 2 caps 3 times daily    Dispense:  180 capsule    Refill:  3  . mirabegron ER (MYRBETRIQ) 25 MG TB24 tablet    Sig: Take 1 tablet (25 mg total) by mouth daily.    Dispense:  30 tablet    Refill:  11  . albuterol (PROVENTIL) (2.5 MG/3ML) 0.083% nebulizer solution    Sig: Take 3 mLs (2.5 mg total) by nebulization every 6 (six) hours as needed for wheezing or shortness of breath.    Dispense:  150 mL    Refill:  2  . alendronate (FOSAMAX) 70 MG tablet    Sig: Take 1 tablet (70 mg total) by mouth every 7 (seven) days. Take with a full glass of water on an empty stomach.    Dispense:  4 tablet    Refill:  11   Orders Placed This Encounter  Procedures  . MM DIGITAL SCREENING BILATERAL    Standing Status:   Future    Standing Expiration Date:   04/27/2018    Order Specific Question:   Reason for Exam (SYMPTOM  OR DIAGNOSIS REQUIRED)  Answer:   routine screening mammogram    Order Specific Question:   Is the patient pregnant?    Answer:   No    Order Specific Question:   Preferred imaging location?    Answer:   [facility]  . Flu Vaccine QUAD 6+ mos PF IM (Fluarix Quad PF)  . Ambulatory referral to Home Health    Referral Priority:   Routine    Referral Type:   Home Health Care    Referral Reason:   Specialty Services Required    Referred to Provider:   [facility]    Requested Specialty:   [place]    Number of Visits Requested:   1      Follow up plan: Return in about 6 months (around 08/25/2017) for MS, Pain, Med [place] 02/26/2017, 12:17 AM

## 2017-02-25 NOTE — Assessment & Plan Note (Signed)
Stable continue PPI, previous discussion Concern remains known osteopenia, continue management

## 2017-02-25 NOTE — Assessment & Plan Note (Signed)
Stable Refilled Albuterol solution, counseled on only PRN use

## 2017-02-25 NOTE — Assessment & Plan Note (Signed)
Chronic relapsing remitting MS, currently on therapy per Neurology, with recent worsening Left leg symptoms with some foot drop and numbness intermittently still - Followed by [facility] (Dr Malvin Johns) - needs to schedule apt - Referral to [facility] PT, RN - Increase Gabapentin

## 2017-02-26 NOTE — Assessment & Plan Note (Signed)
Refilled Myrbetriq Follow-up with Urology if not improved in future for neurogenic bladder

## 2017-02-26 NOTE — Assessment & Plan Note (Signed)
Stable chronic problem Refilled Myrtbetriq Stop Detrol Follow-up with Urology if not improved

## 2017-02-26 NOTE — Assessment & Plan Note (Signed)
Stable on pravastatin  

## 2017-02-26 NOTE — Assessment & Plan Note (Signed)
Stable chronic problem secondary to MS No longer on pain management oxycodone by [facility] due to problem with someone taking her rx Advised patient I do not plan to re-start her on chronic opiates Increase Gabapentin from 300mg  TID to 600mg  TID titrate up Continue muscle relaxant per Neurology, needs to follow-up Increase Tylenol dosing

## 2017-02-26 NOTE — Addendum Note (Signed)
Addended by: Smitty Cords on: 02/26/2017 12:42 AM   Modules accepted: Orders

## 2017-02-26 NOTE — Assessment & Plan Note (Signed)
Start Vitamin D3 5,000 iu weekly for 12 weeks then reduce to 2k

## 2017-03-17 ENCOUNTER — Telehealth: Payer: Self-pay | Admitting: Family Medicine

## 2017-03-17 ENCOUNTER — Other Ambulatory Visit: Payer: Self-pay | Admitting: Family Medicine

## 2017-03-17 DIAGNOSIS — K219 Gastro-esophageal reflux disease without esophagitis: Secondary | ICD-10-CM

## 2017-03-17 DIAGNOSIS — J449 Chronic obstructive pulmonary disease, unspecified: Secondary | ICD-10-CM | POA: Diagnosis not present

## 2017-03-17 DIAGNOSIS — M810 Age-related osteoporosis without current pathological fracture: Secondary | ICD-10-CM | POA: Diagnosis not present

## 2017-03-17 DIAGNOSIS — M8580 Other specified disorders of bone density and structure, unspecified site: Secondary | ICD-10-CM | POA: Diagnosis not present

## 2017-03-17 DIAGNOSIS — G35 Multiple sclerosis: Secondary | ICD-10-CM | POA: Diagnosis not present

## 2017-03-17 DIAGNOSIS — N319 Neuromuscular dysfunction of bladder, unspecified: Secondary | ICD-10-CM | POA: Diagnosis not present

## 2017-03-17 NOTE — Telephone Encounter (Signed)
Spoke with [name] other med's on her list except dalfampridine was Rx by Dr. Malvin Johns informed him to call Dr. Malvin Johns and patient fell 2 weeks ago and now she is asymptomatic advised jesse that she can call office if she gets any symptoms.

## 2017-03-17 NOTE — Telephone Encounter (Signed)
[name] with [facility] started care with pt today.  Pt fell recently and hit her forehead and still has pain 4 out of 10.  She is also missing medications.  She has no vitamin D3, dalfampridine, protonix or 81 mg asprin and pharmacy has no order for refills.  His call back number is [phone]

## 2017-03-18 DIAGNOSIS — G35 Multiple sclerosis: Secondary | ICD-10-CM | POA: Diagnosis not present

## 2017-03-18 DIAGNOSIS — J449 Chronic obstructive pulmonary disease, unspecified: Secondary | ICD-10-CM | POA: Diagnosis not present

## 2017-03-18 DIAGNOSIS — N319 Neuromuscular dysfunction of bladder, unspecified: Secondary | ICD-10-CM | POA: Diagnosis not present

## 2017-03-18 DIAGNOSIS — M810 Age-related osteoporosis without current pathological fracture: Secondary | ICD-10-CM | POA: Diagnosis not present

## 2017-03-18 DIAGNOSIS — M8580 Other specified disorders of bone density and structure, unspecified site: Secondary | ICD-10-CM | POA: Diagnosis not present

## 2017-03-19 DIAGNOSIS — M79605 Pain in left leg: Secondary | ICD-10-CM | POA: Diagnosis not present

## 2017-03-19 DIAGNOSIS — H539 Unspecified visual disturbance: Secondary | ICD-10-CM | POA: Diagnosis not present

## 2017-03-19 DIAGNOSIS — G35 Multiple sclerosis: Secondary | ICD-10-CM | POA: Diagnosis not present

## 2017-03-19 DIAGNOSIS — M545 Low back pain: Secondary | ICD-10-CM | POA: Diagnosis not present

## 2017-03-21 DIAGNOSIS — J449 Chronic obstructive pulmonary disease, unspecified: Secondary | ICD-10-CM | POA: Diagnosis not present

## 2017-03-21 DIAGNOSIS — M810 Age-related osteoporosis without current pathological fracture: Secondary | ICD-10-CM | POA: Diagnosis not present

## 2017-03-21 DIAGNOSIS — N319 Neuromuscular dysfunction of bladder, unspecified: Secondary | ICD-10-CM | POA: Diagnosis not present

## 2017-03-21 DIAGNOSIS — G35 Multiple sclerosis: Secondary | ICD-10-CM | POA: Diagnosis not present

## 2017-03-21 DIAGNOSIS — M8580 Other specified disorders of bone density and structure, unspecified site: Secondary | ICD-10-CM | POA: Diagnosis not present

## 2017-03-25 DIAGNOSIS — M8580 Other specified disorders of bone density and structure, unspecified site: Secondary | ICD-10-CM | POA: Diagnosis not present

## 2017-03-25 DIAGNOSIS — M810 Age-related osteoporosis without current pathological fracture: Secondary | ICD-10-CM | POA: Diagnosis not present

## 2017-03-25 DIAGNOSIS — J449 Chronic obstructive pulmonary disease, unspecified: Secondary | ICD-10-CM | POA: Diagnosis not present

## 2017-03-25 DIAGNOSIS — N319 Neuromuscular dysfunction of bladder, unspecified: Secondary | ICD-10-CM | POA: Diagnosis not present

## 2017-03-25 DIAGNOSIS — G35 Multiple sclerosis: Secondary | ICD-10-CM | POA: Diagnosis not present

## 2017-03-26 DIAGNOSIS — N319 Neuromuscular dysfunction of bladder, unspecified: Secondary | ICD-10-CM | POA: Diagnosis not present

## 2017-03-26 DIAGNOSIS — M8580 Other specified disorders of bone density and structure, unspecified site: Secondary | ICD-10-CM | POA: Diagnosis not present

## 2017-03-26 DIAGNOSIS — M810 Age-related osteoporosis without current pathological fracture: Secondary | ICD-10-CM | POA: Diagnosis not present

## 2017-03-26 DIAGNOSIS — G35 Multiple sclerosis: Secondary | ICD-10-CM | POA: Diagnosis not present

## 2017-03-26 DIAGNOSIS — J449 Chronic obstructive pulmonary disease, unspecified: Secondary | ICD-10-CM | POA: Diagnosis not present

## 2017-03-28 DIAGNOSIS — M8580 Other specified disorders of bone density and structure, unspecified site: Secondary | ICD-10-CM | POA: Diagnosis not present

## 2017-03-28 DIAGNOSIS — G35 Multiple sclerosis: Secondary | ICD-10-CM | POA: Diagnosis not present

## 2017-03-28 DIAGNOSIS — N319 Neuromuscular dysfunction of bladder, unspecified: Secondary | ICD-10-CM | POA: Diagnosis not present

## 2017-03-28 DIAGNOSIS — J449 Chronic obstructive pulmonary disease, unspecified: Secondary | ICD-10-CM | POA: Diagnosis not present

## 2017-03-28 DIAGNOSIS — M810 Age-related osteoporosis without current pathological fracture: Secondary | ICD-10-CM | POA: Diagnosis not present

## 2017-04-01 ENCOUNTER — Ambulatory Visit (INDEPENDENT_AMBULATORY_CARE_PROVIDER_SITE_OTHER): Payer: Medicare Other | Admitting: Family Medicine

## 2017-04-01 ENCOUNTER — Encounter: Payer: Self-pay | Admitting: Family Medicine

## 2017-04-01 VITALS — BP 127/81 | HR 99 | Temp 98.3°F | Ht 64.0 in | Wt 143.6 lb

## 2017-04-01 DIAGNOSIS — M810 Age-related osteoporosis without current pathological fracture: Secondary | ICD-10-CM | POA: Diagnosis not present

## 2017-04-01 DIAGNOSIS — J449 Chronic obstructive pulmonary disease, unspecified: Secondary | ICD-10-CM | POA: Diagnosis not present

## 2017-04-01 DIAGNOSIS — G35 Multiple sclerosis: Secondary | ICD-10-CM | POA: Diagnosis not present

## 2017-04-01 DIAGNOSIS — R220 Localized swelling, mass and lump, head: Secondary | ICD-10-CM

## 2017-04-01 DIAGNOSIS — T7840XA Allergy, unspecified, initial encounter: Secondary | ICD-10-CM

## 2017-04-01 DIAGNOSIS — M8580 Other specified disorders of bone density and structure, unspecified site: Secondary | ICD-10-CM | POA: Diagnosis not present

## 2017-04-01 DIAGNOSIS — N319 Neuromuscular dysfunction of bladder, unspecified: Secondary | ICD-10-CM | POA: Diagnosis not present

## 2017-04-01 MED ORDER — PREDNISONE 10 MG PO TABS: ORAL_TABLET | ORAL | 0 refills | Status: DC

## 2017-04-01 NOTE — Patient Instructions (Addendum)
Thank you for coming to the clinic today.  1. Lip swelling and facial swelling most likely from allergic reaction, either food or other source. I do not think it is your medication.  If gradually improving, then no changes. Continue with current treatment. May take Benadryl or Claritin as needed.  Try to avoid eating any unusual food, stick with basic options. Until it resolves, then gradually re-introduce your other normal foods, and try to figure out what the cause was.  If not improving, then go ahead and take the Prednisone steroid taper as prescribed, and if significant worsening shortness of breath, throat swelling, airway closing, nausea vomiting.  Then I would recommend going to hospital ED if any of these symptoms,.  Please schedule a Follow-up Appointment to: Return in about 1 week (around 04/08/2017), or if symptoms worsen or fail to improve, for lip swelling, allergic reaction.  If you have any other questions or concerns, please feel free to call the clinic or send a message through MyChart. You may also schedule an earlier appointment if necessary.  Additionally, you may be receiving a survey about your experience at our clinic within a few days to 1 week by e-mail or mail. We value your feedback.  [name], DO [facility], [place]

## 2017-04-01 NOTE — Progress Notes (Signed)
Subjective:    Patient ID: [name], female    DOB: June 26, 1955, 61 y.o.   MRN: [id number]  [name] is a 61 y.o. female presenting on 04/01/2017 for Facial Swelling (swelling in lips since this morning )  Patient presents for a same day appointment.  HPI  LIP / FACIAL SWELLING: Reports new onset problem started this morning when woke up had lower lip swelling and left side of face swelling, only potential trigger was she ate [place] food last night (without seafood) no concerning foods that she has not eaten before, only new medicine was Tramadol started 1 week ago, but did fine earlier on this medicine. No similar prior episodes before. - No known history of Lisinopril or ACEi use - She has had steroids / prednisone in past before, with PMH Multiple Sclerosis (MS), none recently - Denies any nausea vomiting, dyspnea, coughing, throat swelling, rash or hives, chest pain or tightness, fever/chills, numbness tingling  Health Maintenance: - UTD Flu Shot, received 02/25/17  Depression screen PHQ 2/9 05/29/2015  Decreased Interest 0  Down, Depressed, Hopeless 0  PHQ - 2 Score 0    Social History  Substance Use Topics  . Smoking status: Current Some Day Smoker    Packs/day: 1.00    Years: 20.00    Last attempt to quit: 01/03/1995  . Smokeless tobacco: Current User  . Alcohol use No    Review of Systems Per HPI unless specifically indicated above     Objective:    BP 127/81 (BP Location: Right Arm, Patient Position: Sitting, Cuff Size: Normal)   Pulse 99   Temp 98.3 F (36.8 C) (Oral)   Ht '5\' 4"'  (1.626 m)   Wt 143 lb 9.6 oz (65.1 kg)   BMI 24.65 kg/m   Wt Readings from Last 3 Encounters:  04/01/17 143 lb 9.6 oz (65.1 kg)  02/25/17 142 lb 12.8 oz (64.8 kg)  07/23/16 164 lb (74.4 kg)    Physical Exam  Constitutional: She is oriented to person, place, and time. She appears well-developed and well-nourished. No distress.  Chronically ill but currently well  appearing, comfortable, cooperative  HENT:  Head: Normocephalic and atraumatic.  Mouth/Throat: Oropharynx is clear and moist.  Nares patent without purulence or edema. Oropharynx clear without any oropharyngeal edema or any airway narrowing, no erythema, exudates, asymmetry.  Persistent mild to moderate swelling of lower lip only R side > L side, with mostly resolved facial / cheek area swelling compared to previous picture.  Eyes: Conjunctivae are normal. Right eye exhibits no discharge. Left eye exhibits no discharge.  Neck: Normal range of motion. Neck supple. No thyromegaly present.  Cardiovascular: Normal rate, regular rhythm, normal heart sounds and intact distal pulses.   No murmur heard. Pulmonary/Chest: Effort normal and breath sounds normal. No respiratory distress. She has no wheezes. She has no rales.  Musculoskeletal: Normal range of motion. She exhibits no edema.  Lymphadenopathy:    She has no cervical adenopathy.  Neurological: She is alert and oriented to person, place, and time. No cranial nerve deficit. Coordination normal.  Distal sensation intact to light touch bilateral extremity  Skin: Skin is warm and dry. No rash noted. She is not diaphoretic. No erythema.  Psychiatric: She has a normal mood and affect. Her behavior is normal.  Well groomed, good eye contact, normal speech and thoughts  Nursing note and vitals reviewed.  Results for orders placed or performed in visit on 02/12/17  COMPLETE METABOLIC PANEL  WITH GFR  Result Value Ref Range   Sodium 141 135 - 146 mmol/L   Potassium 3.8 3.5 - 5.3 mmol/L   Chloride 103 98 - 110 mmol/L   CO2 25 20 - 32 mmol/L   Glucose, Bld 70 65 - 99 mg/dL   BUN 12 7 - 25 mg/dL   Creat 0.49 (L) 0.50 - 0.99 mg/dL   Total Bilirubin 0.4 0.2 - 1.2 mg/dL   Alkaline Phosphatase 71 33 - 130 U/L   AST 14 10 - 35 U/L   ALT 10 6 - 29 U/L   Total Protein 7.0 6.1 - 8.1 g/dL   Albumin 4.4 3.6 - 5.1 g/dL   Calcium 9.2 8.6 - 10.4 mg/dL    GFR, Est African American >89 >=60 mL/min   GFR, Est Non African American >89 >=60 mL/min  CBC with Differential/Platelet  Result Value Ref Range   WBC 4.6 3.8 - 10.8 K/uL   RBC 4.24 3.80 - 5.10 MIL/uL   Hemoglobin 14.4 11.7 - 15.5 g/dL   HCT 43.4 35.0 - 45.0 %   MCV 102.4 (H) 80.0 - 100.0 fL   MCH 34.0 (H) 27.0 - 33.0 pg   MCHC 33.2 32.0 - 36.0 g/dL   RDW 13.7 11.0 - 15.0 %   Platelets 194 140 - 400 K/uL   MPV 10.4 7.5 - 12.5 fL   Neutro Abs 3,358 1,500 - 7,800 cells/uL   Lymphs Abs 782 (L) 850 - 3,900 cells/uL   Monocytes Absolute 414 200 - 950 cells/uL   Eosinophils Absolute 46 15 - 500 cells/uL   Basophils Absolute 0 0 - 200 cells/uL   Neutrophils Relative % 73 %   Lymphocytes Relative 17 %   Monocytes Relative 9 %   Eosinophils Relative 1 %   Basophils Relative 0 %   Smear Review Criteria for review not met   Hemoglobin A1c  Result Value Ref Range   Hgb A1c MFr Bld 5.1 <5.7 %   Mean Plasma Glucose 100 mg/dL  Lipid panel  Result Value Ref Range   Cholesterol 173 <200 mg/dL   Triglycerides 46 <150 mg/dL   HDL 74 >50 mg/dL   Total CHOL/HDL Ratio 2.3 <5.0 Ratio   VLDL 9 <30 mg/dL   LDL Cholesterol 90 <100 mg/dL  VITAMIN D 25 Hydroxy (Vit-D Deficiency, Fractures)  Result Value Ref Range   Vit D, 25-Hydroxy 27 (L) 30 - 100 ng/mL  Hepatitis C antibody  Result Value Ref Range   HCV Ab NON-REACTIVE NON-REACTIVE  HIV antibody  Result Value Ref Range   HIV 1&2 Ab, 4th Generation NONREACTIVE NONREACTIVE  Vitamin B12  Result Value Ref Range   Vitamin B-12 535 200 - 1,100 pg/mL  Folate  Result Value Ref Range   Folate 11.4 >5.4 ng/mL      Assessment & Plan:   Problem List Items Addressed This Visit    None    Visit Diagnoses    Lip swelling    -  Primary   Relevant Medications   predniSONE (DELTASONE) 10 MG tablet   Allergic reaction, initial encounter       Relevant Medications   predniSONE (DELTASONE) 10 MG tablet  New onset allergic reaction, episode  facial edema primarily lower lip < 24 hours now with improvement gradually without worsening. No history of anaphylaxis, clinically not consistent today. Well-appearing and stable resp status - No significant known allergies (food or environmental), does have PCN allergy, no exposure recently - Only new med  Tramadol tolerating well for 1 week without problem - Potential exposure with [place] food (but not different from previous diet)  Plan: 1. Reassurance - since improving monitor for improvement, and reviewed concerns if worsening - Try to figure out potential food allergy 2. May start OTC anti-histamine blockade - Loratadine 27m daily vs Benadryl if needs acutely but caution sedation 3. Rx Prednisone taper 6 day only fill and start IF needed if not resolved, or if worsening, however if worsening needs to be seen or go to hospital ED, with strict return criteria given today  Future consider referral to Allergist if recurrence and not improved       Meds ordered this encounter  Medications  .               . predniSONE (DELTASONE) 10 MG tablet    Sig: Take 6 tabs with breakfast Day 1, 5 tabs Day 2, 4 tabs Day 3, 3 tabs Day 4, 2 tabs Day 5, 1 tab Day 6.    Dispense:  21 tablet    Refill:  0    Follow up plan: Return in about 1 week (around 04/08/2017), or if symptoms worsen or fail to improve, for lip swelling, allergic reaction.  ANobie Putnam D[facility] 04/01/2017, 5:54 PM

## 2017-04-03 DIAGNOSIS — M8580 Other specified disorders of bone density and structure, unspecified site: Secondary | ICD-10-CM | POA: Diagnosis not present

## 2017-04-03 DIAGNOSIS — G35 Multiple sclerosis: Secondary | ICD-10-CM | POA: Diagnosis not present

## 2017-04-03 DIAGNOSIS — M810 Age-related osteoporosis without current pathological fracture: Secondary | ICD-10-CM | POA: Diagnosis not present

## 2017-04-03 DIAGNOSIS — J449 Chronic obstructive pulmonary disease, unspecified: Secondary | ICD-10-CM | POA: Diagnosis not present

## 2017-04-03 DIAGNOSIS — N319 Neuromuscular dysfunction of bladder, unspecified: Secondary | ICD-10-CM | POA: Diagnosis not present

## 2017-04-08 DIAGNOSIS — G35 Multiple sclerosis: Secondary | ICD-10-CM | POA: Diagnosis not present

## 2017-04-08 DIAGNOSIS — M810 Age-related osteoporosis without current pathological fracture: Secondary | ICD-10-CM | POA: Diagnosis not present

## 2017-04-08 DIAGNOSIS — N319 Neuromuscular dysfunction of bladder, unspecified: Secondary | ICD-10-CM | POA: Diagnosis not present

## 2017-04-08 DIAGNOSIS — M8580 Other specified disorders of bone density and structure, unspecified site: Secondary | ICD-10-CM | POA: Diagnosis not present

## 2017-04-08 DIAGNOSIS — J449 Chronic obstructive pulmonary disease, unspecified: Secondary | ICD-10-CM | POA: Diagnosis not present

## 2017-04-15 ENCOUNTER — Telehealth: Payer: Self-pay | Admitting: Family Medicine

## 2017-04-15 ENCOUNTER — Encounter: Payer: Self-pay | Admitting: Family Medicine

## 2017-04-15 DIAGNOSIS — R600 Localized edema: Secondary | ICD-10-CM | POA: Insufficient documentation

## 2017-04-15 MED ORDER — FUROSEMIDE 20 MG PO TABS: ORAL_TABLET | ORAL | 0 refills | Status: DC

## 2017-04-15 NOTE — Telephone Encounter (Signed)
Last seen 04/01/17, for lip swelling given 1 week of prednisone.  Now receive call from [facility] PT that she has increased lower extremity pitting edema +2-3 by their report, this is a new finding, with some mild discomfort inc in legs, hard to differentiate with known chronic MS and pain, she also endorsed increased urination over weekend with incontinence and changing diaper more frequently, she has known complex bladder with Neurogenic bladder 2/2 MS and urinary incontinence, she takes Myrbetriq regularly, followed by Neurology for this. She denies any dysuria or abdominal pain or symptoms of UTI, fever/chills.  She has no known cardiac history CHF or no documented ECHO. She has normal renal function on last lab.  I recommended brief trial of lasix for LE edema 20mg  daily x 3 days, will send #10 pills for them to have on hand incase need again. After 3 days instructed to stop. If improved edema, then no significant follow-up needed right now. Most likely secondary to recent prednisone and inactivity.  Otherwise if swelling does not improve, I would recommend follow-up with me for ordering other labs including chemistry, BNP, and likely ECHO if concerned.  [name], DO [facility] 04/15/2017, 12:34 PM

## 2017-04-18 DIAGNOSIS — N319 Neuromuscular dysfunction of bladder, unspecified: Secondary | ICD-10-CM | POA: Diagnosis not present

## 2017-04-18 DIAGNOSIS — M810 Age-related osteoporosis without current pathological fracture: Secondary | ICD-10-CM | POA: Diagnosis not present

## 2017-04-18 DIAGNOSIS — G35 Multiple sclerosis: Secondary | ICD-10-CM | POA: Diagnosis not present

## 2017-04-18 DIAGNOSIS — M8580 Other specified disorders of bone density and structure, unspecified site: Secondary | ICD-10-CM | POA: Diagnosis not present

## 2017-04-18 DIAGNOSIS — J449 Chronic obstructive pulmonary disease, unspecified: Secondary | ICD-10-CM | POA: Diagnosis not present

## 2017-06-18 ENCOUNTER — Other Ambulatory Visit: Payer: Self-pay | Admitting: Family Medicine

## 2017-06-18 DIAGNOSIS — E559 Vitamin D deficiency, unspecified: Secondary | ICD-10-CM

## 2017-07-24 ENCOUNTER — Other Ambulatory Visit: Payer: Self-pay | Admitting: Family Medicine

## 2017-07-24 DIAGNOSIS — E782 Mixed hyperlipidemia: Secondary | ICD-10-CM

## 2017-09-12 ENCOUNTER — Other Ambulatory Visit: Payer: Self-pay | Admitting: Family Medicine

## 2017-09-12 DIAGNOSIS — J432 Centrilobular emphysema: Secondary | ICD-10-CM

## 2017-09-16 DIAGNOSIS — E559 Vitamin D deficiency, unspecified: Secondary | ICD-10-CM | POA: Diagnosis not present

## 2017-09-16 DIAGNOSIS — M545 Low back pain: Secondary | ICD-10-CM | POA: Diagnosis not present

## 2017-09-16 DIAGNOSIS — G379 Demyelinating disease of central nervous system, unspecified: Secondary | ICD-10-CM | POA: Diagnosis not present

## 2017-09-16 DIAGNOSIS — M79605 Pain in left leg: Secondary | ICD-10-CM | POA: Diagnosis not present

## 2017-09-16 DIAGNOSIS — G35 Multiple sclerosis: Secondary | ICD-10-CM | POA: Diagnosis not present

## 2017-09-16 DIAGNOSIS — H539 Unspecified visual disturbance: Secondary | ICD-10-CM | POA: Diagnosis not present

## 2017-09-17 ENCOUNTER — Other Ambulatory Visit: Payer: Self-pay

## 2017-09-17 DIAGNOSIS — J432 Centrilobular emphysema: Secondary | ICD-10-CM

## 2017-09-17 MED ORDER — ALBUTEROL SULFATE HFA 108 (90 BASE) MCG/ACT IN AERS: INHALATION_SPRAY | RESPIRATORY_TRACT | 2 refills | Status: DC

## 2017-09-19 ENCOUNTER — Other Ambulatory Visit: Payer: Self-pay | Admitting: Neurology

## 2017-09-19 DIAGNOSIS — G35 Multiple sclerosis: Secondary | ICD-10-CM

## 2017-09-29 ENCOUNTER — Ambulatory Visit
Admission: RE | Admit: 2017-09-29 | Discharge: 2017-09-29 | Disposition: A | Discharge status: Home / Self Care | Payer: Medicare Other | Source: Ambulatory Visit | Attending: Neurology | Admitting: Neurology

## 2017-09-29 DIAGNOSIS — H547 Unspecified visual loss: Secondary | ICD-10-CM | POA: Diagnosis not present

## 2017-09-29 DIAGNOSIS — R531 Weakness: Secondary | ICD-10-CM | POA: Diagnosis not present

## 2017-09-29 DIAGNOSIS — G35 Multiple sclerosis: Secondary | ICD-10-CM

## 2017-09-29 MED ORDER — GADOBENATE DIMEGLUMINE 529 MG/ML IV SOLN
10.0000 mL | Freq: Once | INTRAVENOUS | Status: AC | PRN
  Administered 2017-09-29: 10 mL via INTRAVENOUS

## 2017-10-06 DIAGNOSIS — G35 Multiple sclerosis: Secondary | ICD-10-CM | POA: Diagnosis not present

## 2017-10-06 DIAGNOSIS — M545 Low back pain: Secondary | ICD-10-CM | POA: Diagnosis not present

## 2017-10-06 DIAGNOSIS — M79605 Pain in left leg: Secondary | ICD-10-CM | POA: Diagnosis not present

## 2017-10-06 DIAGNOSIS — H539 Unspecified visual disturbance: Secondary | ICD-10-CM | POA: Diagnosis not present

## 2017-10-14 ENCOUNTER — Ambulatory Visit: Payer: Medicare Other | Admitting: Occupational Therapy

## 2017-10-20 ENCOUNTER — Ambulatory Visit: Payer: Medicare Other | Admitting: Occupational Therapy

## 2017-10-21 ENCOUNTER — Ambulatory Visit: Payer: Medicare Other | Attending: Neurology | Admitting: Occupational Therapy

## 2017-10-28 ENCOUNTER — Other Ambulatory Visit: Payer: Self-pay

## 2017-10-28 ENCOUNTER — Encounter: Payer: Self-pay | Admitting: Occupational Therapy

## 2017-10-28 ENCOUNTER — Ambulatory Visit: Payer: Medicare Other | Attending: Neurology | Admitting: Occupational Therapy

## 2017-10-28 DIAGNOSIS — M6281 Muscle weakness (generalized): Secondary | ICD-10-CM

## 2017-10-28 DIAGNOSIS — R278 Other lack of coordination: Secondary | ICD-10-CM | POA: Diagnosis not present

## 2017-10-30 ENCOUNTER — Encounter: Payer: Medicare Other | Admitting: Occupational Therapy

## 2017-10-31 NOTE — Therapy (Signed)
[facility] MAIN [facility] SERVICES [address] [place], [place], [address] Phone: [phone]   Fax:  [phone]  Occupational Therapy Evaluation  Patient Details  Name: [name] MRN: [id number] Date of Birth: 1955-08-13 No data recorded  Encounter Date: 10/28/2017  OT End of Session - 10/31/17 1057    Visit Number  1    Number of Visits  24    Date for OT Re-Evaluation  01/23/18    OT Start Time  1300    OT Stop Time  1355    OT Time Calculation (min)  55 min    Activity Tolerance  Patient tolerated treatment well    Behavior During Therapy  [facility] for tasks assessed/performed       Past Medical History:  Diagnosis Date  . Depression   . Headache   . Hyperlipidemia   . MS (multiple sclerosis) (HCC) 1990  . Neurogenic bladder   . Osteoporosis     Past Surgical History:  Procedure Laterality Date  . ABDOMINAL HYSTERECTOMY    . OOPHORECTOMY Bilateral     There were no vitals filed for this visit.  Subjective Assessment - 10/31/17 1048    Subjective   Patient reports about 3 months ago she started to feel weak in the left arm, reports she is legally blind.      Pertinent History  Patient reports she was diagnosed with MS years ago and has recently started to feel weak in her left dominant arm and hand.  She denies any previous therapy and reports memory issues and can provide a limited history.     Patient Stated Goals  Patient reports she wants to be able to walk better, use her left hand and be able to write again.     Currently in Pain?  Yes    Pain Score  4     Pain Location  Leg    Pain Orientation  Left    Pain Descriptors / Indicators  Sharp    Pain Type  Chronic pain    Pain Onset  More than a month ago    Pain Frequency  Constant    Multiple Pain Sites  No        [facility] OT Assessment - 10/31/17 1050      Assessment   Medical Diagnosis  left hand weakness    Onset Date/Surgical Date  08/03/17    Prior Therapy   none      Balance Screen   Has the patient fallen in the past 6 months  Yes    How many times?  once at her son's house    Has the patient had a decrease in activity level because of a fear of falling?   No    Is the patient reluctant to leave their home because of a fear of falling?   No      Home  Environment   Family/patient expects to be discharged to:  Private residence    Living Arrangements  Alone    Available Help at Discharge  Family    Type of Home  Aartment    Home Access  Level entry    Home Layout  One level    Bathroom Shower/Tub  Tub/Shower unit;Curtain    Shower/tub characteristics  Curtain    Bathroom Toilet  Handicapped height    Bathroom Accessibility  Yes    How accessible  Accessible via walker    Home Equipment  Walker - 2 wheels;Cane - single point;Tub bench;Grab bars - tub/shower    Additional Comments  She has an aide 7 days, for 2 hours a day in the morning.  Her son drives her to therapy.     Lives With  Alone      Prior Function   Level of Independence  Needs assistance with ADLs;Needs assistance with homemaking    Vocation  On disability    Leisure  loves to fish      ADL   Eating/Feeding  Needs assist with cutting food    Grooming  Minimal assistance    Upper Body Bathing  Minimal assistance    Lower Body Bathing  Minimal assistance    Upper Body Dressing  Independent    Lower Body Dressing  Minimal assistance    Toilet Transfer  Modified independent    Toileting - Clothing Manipulation  Modified independent    Toileting -  Hygiene  Modified Independent    Tub/Shower Transfer  Minimal assistance    ADL comments  Patient reports she has an aide, [name] who comes in 7 days a week for a couple hours a day to assist patient with self care and IADL tasks.  She reports she is feeding herself now with her nondominant right hand and would like to get back to using her left hand, she reports using a rollator at times but did not bring in today, was in  transport chair.  She reports decreased memory and often relies on her caregiver for information.  Caregiver was not present this date, son dropped her off for appt.  Handwriting currently illegible.       IADL   Prior Level of Function Shopping  requires assistance    Shopping  Needs to be accompanied on any shopping trip    Prior Level of Function Light Housekeeping  required assist from aide    Light Housekeeping  Needs help with all home maintenance tasks    Prior Level of Function Meal Prep  independent    Meal Prep  Able to complete simple cold meal and snack prep    Prior Level of Function Community Mobility  independent    Community Mobility  Relies on family or friends for transportation    Prior Level of Function Medication Managment  independent    Medication Management  Is responsible for taking medication in correct dosages at correct time    Prior Level of Function Financial Management  independent    Financial Management  Requires assistance      Mobility   Mobility Status  Needs assist    Mobility Status Comments  uses rollator at home       Written Expression   Handwriting  Not legible      Vision - History   Baseline Vision  Legally blind    Additional Comments  Patient reports recent decline/worsening in her vision, reports increased fuzziness.  Reports her right eye is slightly better and left eye is the weakest      Cognition   Overall Cognitive Status  Impaired/Different from baseline    Memory  Impaired    Memory Impairment  Decreased short term memory      Sensation   Light Touch  Appears Intact    Stereognosis  Appears Intact    Additional Comments  Patient reports some pin and needle sensation in left UE.      Coordination   Gross Motor Movements are Fluid and Coordinated  No    Fine Motor Movements are Fluid and Coordinated  No    Coordination and Movement Description  Tremors noted in left UE, decreased coordination in left compared to right.   Ataxia noted in LUE.     9 Hole Peg Test  Right;Left    Right 9 Hole Peg Test  37 sec    Left 9 Hole Peg Test  49 sec       ROM / Strength   AROM / PROM / Strength  AROM;Strength      AROM   Overall AROM   Deficits    Overall AROM Comments  Left UE ROM for shoulder flexion to 114 degrees, ABD 130 degrees actively, RUE WFLS      Strength   Overall Strength  Deficits    Overall Strength Comments  LUE 3-/5 shoulder, 3/5 elbow, wrist and hand, RUE 4/5 overall      Hand Function   Right Hand Grip (lbs)  36    Right Hand Lateral Pinch  16 lbs    Right Hand 3 Point Pinch  11 lbs    Left Hand Grip (lbs)  19    Left Hand Lateral Pinch  11 lbs    Left 3 point pinch  8 lbs                      OT Education - 10/31/17 1056    Education provided  Yes    Education Details  role of OT, goals, POC    Person(s) Educated  Patient    Methods  Explanation    Comprehension  Verbalized understanding          OT [place] Term Goals - 10/31/17 1111      OT [place] TERM GOAL #1   Title  Patient will demonstrate holding utensils in her left hand and feeding herself with modified independence with minimal spillage.      Baseline  unable to use left domin hand for self feeding at eval    Time  8    Period  Weeks    Status  New    Target Date  12/26/17      OT Ey TERM GOAL #2   Title  Patient wil complete tub/shower transfer with supervision only.    Baseline  min assist at eval     Time  8    Period  Weeks    Status  New    Target Date  12/26/17      OT [place] TERM GOAL #3   Title  Patient will complete bathing UB and LB with supervision only.     Baseline  min assist at eval    Time  12    Period  Weeks    Status  New    Target Date  01/23/18      OT [place] TERM GOAL #4   Title  Patient will increase left UE strength by 1 mm grade to be able to reach into closet to retrieve clothing items.     Baseline  requires assist from aide, 3-/5 strength at eval    Time  12     Period  Weeks    Status  New    Target Date  01/23/18      OT [place] TERM GOAL #5   Title  Patient will improve coordination on the left to be able to hold pen securely and write her name with 75% legibility.  Baseline  unable at eval    Time  12    Period  Weeks    Status  New    Target Date  01/23/18      [place] Term Additional Goals   Additional [place] Term Goals  Yes      OT [place] TERM GOAL #6   Title  Patient will complete lower body dressing with modified independence using adaptive equipment if needed.     Baseline  min assist at eval    Time  12    Period  Weeks    Status  New    Target Date  01/23/18            Plan - 10/31/17 1058    Clinical Impression Statement  Patient is a 63 yo female who was diagnosed with MS about 20+ years ago, referral for OT for left hand weakness and decreased vision.  Patient reports she is legally blind.  Patient reports a recent decline in the last 3 months in her vision as well as her left hand functional use and strength which is her dominant hand.  She is currently requiring increased assisstance with all daily tasks and currently lives alone.  Patient presents with muscle weakness in the left UE, decreased ROM, decreased coordination skills, decreased vision, balance and decreased ability to perform self care and IADL tasks. She would benefit from skilled OT to maximize her safety and independence in necessary daily tasks, ADLs and IADLs.      Occupational Profile and client history currently impacting functional performance  patient is legally blind, progressive disease process with MS for over 20+ years, lives alone, limited availability for transportation.      Occupational performance deficits (Please refer to evaluation for details):  ADL's;Leisure    Rehab Potential  Good    OT Frequency  2x / week    OT Duration  12 weeks    OT Treatment/Interventions  Self-care/ADL training;Therapeutic exercise;Visual/perceptual  remediation/compensation;Moist Heat;Neuromuscular education;Patient/family education;Building services engineer;Therapeutic activities;Balance training;DME and/or AE instruction;Cognitive remediation/compensation    Plan  Positive:  motivation, family support.  Negative:  lives alone, legally blind, requires caregiver 7 days a week for a few hours a day for self care    Clinical Decision Making  Several treatment options, min-mod task modification necessary    Consulted and Agree with Plan of Care  Patient       Patient will benefit from skilled therapeutic intervention in order to improve the following deficits and impairments:  Decreased cognition, Decreased knowledge of use of DME, Impaired vision/preception, Pain, Decreased coordination, Decreased activity tolerance, Decreased endurance, Decreased strength, Decreased balance, Impaired UE functional use  Visit Diagnosis: Muscle weakness (generalized)  Other lack of coordination    Problem List Patient Active Problem List   Diagnosis Date Noted  . Lower extremity edema 04/15/2017  . Macrocytic anemia 02/11/2017  . Abscess, gluteal, left 05/07/2016  . Vitamin D deficiency 02/02/2016  . Mixed hyperlipidemia 01/03/2015  . MS (multiple sclerosis) (HCC) 01/03/2015  . Depression 01/03/2015  . Chronic pain 01/03/2015  . GERD without esophagitis 01/03/2015  . COPD (chronic obstructive pulmonary disease) (HCC) 01/03/2015  . Osteopenia 01/03/2015  . Neurogenic bladder 01/03/2015  . Incontinence in female 01/03/2015   Braven Wolk T Arne Cleveland, OTR/L, CLT  Abdullahi Vallone 10/31/2017, 11:16 AM  [facility] MAIN [facility] SERVICES [address] [place], [place], [address] Phone: [phone]   Fax:  [phone]  Name: [name] MRN: [id number]  Date of Birth: 12-05-1955

## 2017-11-04 ENCOUNTER — Encounter: Payer: Self-pay | Admitting: Occupational Therapy

## 2017-11-04 ENCOUNTER — Ambulatory Visit: Payer: Medicare Other | Admitting: Occupational Therapy

## 2017-11-04 ENCOUNTER — Other Ambulatory Visit: Payer: Self-pay

## 2017-11-04 DIAGNOSIS — M6281 Muscle weakness (generalized): Secondary | ICD-10-CM | POA: Diagnosis not present

## 2017-11-04 DIAGNOSIS — R278 Other lack of coordination: Secondary | ICD-10-CM

## 2017-11-04 NOTE — Therapy (Signed)
[facility] MAIN [facility] SERVICES [address] [place], [place], [address] Phone: [phone]   Fax:  [phone]  Occupational Therapy Treatment  Patient Details  Name: [name] MRN: [id number] Date of Birth: 08-20-1955 No data recorded  Encounter Date: 11/04/2017  OT End of Session - 11/04/17 1348    Visit Number  2    Number of Visits  24    Date for OT Re-Evaluation  01/23/18    OT Start Time  1300    OT Stop Time  1345    OT Time Calculation (min)  45 min    Activity Tolerance  Patient tolerated treatment well    Behavior During Therapy  [facility] for tasks assessed/performed       Past Medical History:  Diagnosis Date  . Depression   . Headache   . Hyperlipidemia   . MS (multiple sclerosis) (HCC) 1990  . Neurogenic bladder   . Osteoporosis     Past Surgical History:  Procedure Laterality Date  . ABDOMINAL HYSTERECTOMY    . OOPHORECTOMY Bilateral     There were no vitals filed for this visit.  Subjective Assessment - 11/04/17 1313    Subjective   I am lucky to have a caregiver [name] to help me or I would be lost.    Pertinent History  Patient reports she was diagnosed with MS years ago and has recently started to feel weak in her left dominant arm and hand.  She denies any previous therapy and reports memory issues and can provide a limited history.     Patient Stated Goals  Patient reports she wants to be able to walk better, use her left hand and be able to write again.     Currently in Pain?  Yes    Pain Score  4     Pain Location  Arm    Pain Orientation  Left    Pain Descriptors / Indicators  Sharp;Aching    Pain Type  Chronic pain    Pain Onset  More than a month ago    Pain Frequency  Constant    Aggravating Factors   standing for a Primeau time    Pain Relieving Factors  sit down or take a break    Multiple Pain Sites  No                   OT Treatments/Exercises (OP) - 11/04/17 1327      ADLs   Writing  Pt seen for handwriting using L hand with built up pen with gripper.  Hand writing was 25% legible with printing but not legible with cursive handwriting with complaints of hand fatiguing as she wrote with pen.  Pt also limited by vision and difficulty with spelling as well.        Neurological Re-education Exercises   Other Exercises 1  Pt instructed yellow theraputty exercises to use for 3pt pinch, 2 pt pinch and lateral pinch with a rest break after about 5 minutes.  Used Puttycise tools to work on forearm pronation and lateral pinch simullating use of key in door at home which is difficult for her.  Pt complaining of a stinging and burning feeling in middle finger and needed to take breaks to rub it to get rid of sensation.  Pt is very motivated to keep oushing herself and needs cues for pacing. 4th and 5th digits start to go into extension when fatigued.  Pt instructed  in home exercise program with written plan for next visit.             OT Education - 11/04/17 1347    Education provided  Yes    Education Details  theraputty exercises for HEP    Person(s) Educated  Patient    Methods  Explanation;Demonstration;Verbal cues    Comprehension  Verbalized understanding;Returned demonstration          OT Guillotte Term Goals - 10/31/17 1111      OT Pinales TERM GOAL #1   Title  Patient will demonstrate holding utensils in her left hand and feeding herself with modified independence with minimal spillage.      Baseline  unable to use left domin hand for self feeding at eval    Time  8    Period  Weeks    Status  New    Target Date  12/26/17      OT Birdsell TERM GOAL #2   Title  Patient wil complete tub/shower transfer with supervision only.    Baseline  min assist at eval     Time  8    Period  Weeks    Status  New    Target Date  12/26/17      OT Yeagley TERM GOAL #3   Title  Patient will complete bathing UB and LB with supervision only.     Baseline  min assist at eval     Time  12    Period  Weeks    Status  New    Target Date  01/23/18      OT Leyland TERM GOAL #4   Title  Patient will increase left UE strength by 1 mm grade to be able to reach into closet to retrieve clothing items.     Baseline  requires assist from aide, 3-/5 strength at eval    Time  12    Period  Weeks    Status  New    Target Date  01/23/18      OT Busey TERM GOAL #5   Title  Patient will improve coordination on the left to be able to hold pen securely and write her name with 75% legibility.     Baseline  unable at eval    Time  12    Period  Weeks    Status  New    Target Date  01/23/18      Profeta Term Additional Goals   Additional Keeny Term Goals  Yes      OT Vroom TERM GOAL #6   Title  Patient will complete lower body dressing with modified independence using adaptive equipment if needed.     Baseline  min assist at eval    Time  12    Period  Weeks    Status  New    Target Date  01/23/18            Plan - 11/04/17 1348    Clinical Impression Statement  Pt is motivated to participate in therapy to increase use and strength of L hand and UE.  She has pain in LUE and hand but is able to use hand and arm for ADLs and IADLs.  She was instructed in theraputty exercises today for strengthening and handwriting exercises mainly in printing and not cursive.  Hand writing for printing is 25% legible.  She would benefit from continues skille OT to maximize safety and independence in ADLs and IADLs.  Occupational Profile and client history currently impacting functional performance  patient is legally blind, progressive disease process with MS for over 20+ years, lives alone, limited availability for transportation.      Occupational performance deficits (Please refer to evaluation for details):  ADL's;Leisure    Rehab Potential  Good    OT Frequency  2x / week    OT Duration  12 weeks    OT Treatment/Interventions  Therapeutic exercise;Cognitive  remediation/compensation;Functional Mobility Training    Plan  Positive:  motivation, family support.  Negative:  lives alone, legally blind, requires caregiver 7 days a week for a few hours a day for self care    Consulted and Agree with Plan of Care  Patient       Patient will benefit from skilled therapeutic intervention in order to improve the following deficits and impairments:  Decreased cognition, Decreased knowledge of use of DME, Impaired vision/preception, Pain, Decreased coordination, Decreased activity tolerance, Decreased endurance, Decreased strength, Decreased balance, Impaired UE functional use  Visit Diagnosis: Muscle weakness (generalized)  Other lack of coordination    Problem List Patient Active Problem List   Diagnosis Date Noted  . Lower extremity edema 04/15/2017  . Macrocytic anemia 02/11/2017  . Abscess, gluteal, left 05/07/2016  . Vitamin D deficiency 02/02/2016  . Mixed hyperlipidemia 01/03/2015  . MS (multiple sclerosis) (HCC) 01/03/2015  . Depression 01/03/2015  . Chronic pain 01/03/2015  . GERD without esophagitis 01/03/2015  . COPD (chronic obstructive pulmonary disease) (HCC) 01/03/2015  . Osteopenia 01/03/2015  . Neurogenic bladder 01/03/2015  . Incontinence in female 01/03/2015   Susanne Borders, OTR/L ascom [phone] 11/04/17, 1:54 PM  Wofford,Susan 11/04/2017, 1:53 PM  [place] [facility] MAIN [facility] SERVICES [address] [place], [place], [address] Phone: [phone]   Fax:  [phone]  Name: DERIONNA SALVADOR MRN: [id number] Date of Birth: [birth year]

## 2017-11-06 ENCOUNTER — Encounter: Payer: Self-pay | Admitting: Occupational Therapy

## 2017-11-06 ENCOUNTER — Ambulatory Visit: Payer: Medicare Other | Admitting: Occupational Therapy

## 2017-11-06 DIAGNOSIS — M6281 Muscle weakness (generalized): Secondary | ICD-10-CM

## 2017-11-06 DIAGNOSIS — R278 Other lack of coordination: Secondary | ICD-10-CM | POA: Diagnosis not present

## 2017-11-06 NOTE — Therapy (Signed)
[facility] [facility] MAIN [facility] SERVICES [address] [place], [place], [address] Phone: [phone]   Fax:  [phone]  Occupational Therapy Treatment  Patient Details  Name: [name] MRN: [id number] Date of Birth: Nov 06, 1955 No data recorded  Encounter Date: 11/06/2017  OT End of Session - 11/06/17 1208    Visit Number  4    Number of Visits  24    Date for OT Re-Evaluation  01/23/18    OT Start Time  1145    OT Stop Time  1230    OT Time Calculation (min)  45 min    Activity Tolerance  Patient tolerated treatment well    Behavior During Therapy  [facility] for tasks assessed/performed       Past Medical History:  Diagnosis Date  . Depression   . Headache   . Hyperlipidemia   . MS (multiple sclerosis) (HCC) 1990  . Neurogenic bladder   . Osteoporosis     Past Surgical History:  Procedure Laterality Date  . ABDOMINAL HYSTERECTOMY    . OOPHORECTOMY Bilateral     There were no vitals filed for this visit.  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    Pertinent History  Patient reports she was diagnosed with MS years ago and has recently started to feel weak in her left dominant arm and hand.  She denies any previous therapy and reports memory issues and can provide a limited history.     Patient Stated Goals  Patient reports she wants to be able to walk better, use her left hand and be able to write again.     Currently in Pain?  Yes       OT TREATMENT    Neuro muscular re-education:  Pt. worked on grasping and flipping cards with her left hand, and progressively increasing speed. Pt. Required cues initially for hand position, and alternating movement pattern. Pt. Worked on shuffling cards. Pt. worked on grasping 1" resistive cubes alternating thumb opposition to the tip of the 2nd digit while the board is placed flat on the tabletop surface. Pt. Performed left hand  strengthening with yellow theraputty. Pt. required cues for proper technique, and hand position. Pt. worked on gross grip, lateral pinch, 3pt. pinch, gross digit extension, and thumb opposition. Pt. required verbal and tactile cues for proper technique.                       OT Education - 11/06/17 1207    Education provided  Yes    Education Details  RUE ther. ex    Person(s) Educated  Patient    Methods  Explanation;Demonstration;Verbal cues    Comprehension  Verbalized understanding;Returned demonstration          OT Cohenour Term Goals - 10/31/17 1111      OT Chock TERM GOAL #1   Title  Patient will demonstrate holding utensils in her left hand and feeding herself with modified independence with minimal spillage.      Baseline  unable to use left domin hand for self feeding at eval    Time  8    Period  Weeks    Status  New    Target Date  12/26/17      OT Mcglaun TERM GOAL #2   Title  Patient wil complete tub/shower transfer with supervision only.    Baseline  min assist at eval     Time  8    Period  Weeks    Status  New    Target Date  12/26/17      OT Hughett TERM GOAL #3   Title  Patient will complete bathing UB and LB with supervision only.     Baseline  min assist at eval    Time  12    Period  Weeks    Status  New    Target Date  01/23/18      OT Minteer TERM GOAL #4   Title  Patient will increase left UE strength by 1 mm grade to be able to reach into closet to retrieve clothing items.     Baseline  requires assist from aide, 3-/5 strength at eval    Time  12    Period  Weeks    Status  New    Target Date  01/23/18      OT Bernier TERM GOAL #5   Title  Patient will improve coordination on the left to be able to hold pen securely and write her name with 75% legibility.     Baseline  unable at eval    Time  12    Period  Weeks    Status  New    Target Date  01/23/18      Jim Term Additional Goals   Additional Vinzant Term Goals  Yes      OT Credeur  TERM GOAL #6   Title  Patient will complete lower body dressing with modified independence using adaptive equipment if needed.     Baseline  min assist at eval    Time  12    Period  Weeks    Status  New    Target Date  01/23/18            Plan - 11/06/17 1208    Clinical Impression Statement  Pt. reports her son is not really able to help her. Pt. has a caregiver 5-6 days aweek for afew hours each day. Pt.'s caregiver assists with washing her clothes, and cooking meals. Pt. continues to present with limited Left UE motor control, and coordination skills. Pt. continues to work on improving LUE strength, and coordination skills.     Occupational Profile and client history currently impacting functional performance  patient is legally blind, progressive disease process with MS for over 20+ years, lives alone, limited availability for transportation.      Occupational performance deficits (Please refer to evaluation for details):  ADL's;Leisure    Rehab Potential  Good    OT Frequency  2x / week    OT Duration  12 weeks    OT Treatment/Interventions  Therapeutic exercise;Cognitive remediation/compensation;Functional Mobility Training    Plan  Positive:  motivation, family support.  Negative:  lives alone, legally blind, requires caregiver 7 days a week for a few hours a day for self care    Clinical Decision Making  Several treatment options, min-mod task modification necessary    Consulted and Agree with Plan of Care  Patient       Patient will benefit from skilled therapeutic intervention in order to improve the following deficits and impairments:  Decreased cognition, Decreased knowledge of use of DME, Impaired vision/preception, Pain, Decreased coordination, Decreased activity tolerance, Decreased endurance, Decreased strength, Decreased balance, Impaired UE functional use  Visit Diagnosis: Muscle weakness (generalized)  Other lack of coordination    Problem List Patient  Active Problem List   Diagnosis Date Noted  . Lower extremity edema 04/15/2017  . Macrocytic anemia 02/11/2017  . Abscess, gluteal, left 05/07/2016  . Vitamin D deficiency 02/02/2016  . Mixed hyperlipidemia 01/03/2015  . MS (multiple sclerosis) (HCC) 01/03/2015  . Depression 01/03/2015  . Chronic pain 01/03/2015  . GERD without esophagitis 01/03/2015  . COPD (chronic obstructive pulmonary disease) (HCC) 01/03/2015  . Osteopenia 01/03/2015  . Neurogenic bladder 01/03/2015  . Incontinence in female 01/03/2015    Olegario Messier, MS, OTR/L 11/06/2017, 12:21 PM  [facility] [facility] MAIN [facility] SERVICES [address] [place], [place], [address] Phone: [phone]   Fax:  [phone]  Name: [name] MRN: [id number] Date of Birth: 06-29-1955

## 2017-11-11 ENCOUNTER — Encounter: Payer: Self-pay | Admitting: Occupational Therapy

## 2017-11-11 ENCOUNTER — Ambulatory Visit: Payer: Medicare Other | Admitting: Occupational Therapy

## 2017-11-11 ENCOUNTER — Encounter: Payer: Medicare Other | Admitting: Occupational Therapy

## 2017-11-11 DIAGNOSIS — M6281 Muscle weakness (generalized): Secondary | ICD-10-CM | POA: Diagnosis not present

## 2017-11-11 DIAGNOSIS — R278 Other lack of coordination: Secondary | ICD-10-CM

## 2017-11-11 NOTE — Therapy (Signed)
[place] [facility] MAIN [facility] SERVICES [address] [place], [place], [address] Phone: [phone]   Fax:  [phone]  Occupational Therapy Treatment  Patient Details  Name: [name] MRN: [id number] Date of Birth: August 10, 1955 No data recorded  Encounter Date: 11/11/2017  OT End of Session - 11/11/17 1008    Visit Number  5    Number of Visits  24    Date for OT Re-Evaluation  01/23/18    OT Start Time  1000    OT Stop Time  1045    OT Time Calculation (min)  45 min    Activity Tolerance  Patient tolerated treatment well    Behavior During Therapy  [facility] for tasks assessed/performed       Past Medical History:  Diagnosis Date  . Depression   . Headache   . Hyperlipidemia   . MS (multiple sclerosis) (HCC) 1990  . Neurogenic bladder   . Osteoporosis     Past Surgical History:  Procedure Laterality Date  . ABDOMINAL HYSTERECTOMY    . OOPHORECTOMY Bilateral     There were no vitals filed for this visit.  Subjective Assessment - 11/11/17 1007    Subjective   Pt. reports she is feeling better.    Pertinent History  Patient reports she was diagnosed with MS years ago and has recently started to feel weak in her left dominant arm and hand.  She denies any previous therapy and reports memory issues and can provide a limited history.     Patient Stated Goals  Patient reports she wants to be able to walk better, use her left hand and be able to write again.     Currently in Pain?  No/denies      OT TREATMENT    Neuromuscular Re-education:  Pt. worked on grasping and flipping cards alternating thumb on fingers, and fingers on thumb movement patterns. Pt. worked on [employer]. Pt. Worked on grasping sticks from the dish, and placing them in the [employer]. Pt. Requires verbal cues for movement patterns.  Therapeutic Exercise:  Pt. worked on pinch strengthening in the left hand for lateral, and 3pt. pinch using yellow, red, green,  and blue resistive clips. Pt. worked on placing the clips at various vertical and horizontal angles. Tactile and verbal cues were required for eliciting the desired movement. Pt. performed gross gripping with grip strengthener. Pt. worked on sustaining grip while grasping pegs and reaching at various heights. Gripper was placed in the 1st resistive slot with the white resistive spring. Pt. had difficulty with motor control during this task. The task was modified, pt. worked on grasping, and holding the peg with resistance.                         OT Education - 11/11/17 1008    Education provided  Yes    Education Details  UE ther. ex, [facility]    Person(s) Educated  Patient    Methods  Explanation;Demonstration;Verbal cues    Comprehension  Verbalized understanding;Returned demonstration          OT Twichell Term Goals - 10/31/17 1111      OT Rapozo TERM GOAL #1   Title  Patient will demonstrate holding utensils in her left hand and feeding herself with modified independence with minimal spillage.      Baseline  unable to use left domin hand for self feeding at eval    Time  8    Period  Weeks    Status  New    Target Date  12/26/17      OT Verastegui TERM GOAL #2   Title  Patient wil complete tub/shower transfer with supervision only.    Baseline  min assist at eval     Time  8    Period  Weeks    Status  New    Target Date  12/26/17      OT Ricciuti TERM GOAL #3   Title  Patient will complete bathing UB and LB with supervision only.     Baseline  min assist at eval    Time  12    Period  Weeks    Status  New    Target Date  01/23/18      OT Mitchum TERM GOAL #4   Title  Patient will increase left UE strength by 1 mm grade to be able to reach into closet to retrieve clothing items.     Baseline  requires assist from aide, 3-/5 strength at eval    Time  12    Period  Weeks    Status  New    Target Date  01/23/18      OT Oser TERM GOAL #5   Title  Patient will improve  coordination on the left to be able to hold pen securely and write her name with 75% legibility.     Baseline  unable at eval    Time  12    Period  Weeks    Status  New    Target Date  01/23/18      Whistler Term Additional Goals   Additional Canty Term Goals  Yes      OT Oak TERM GOAL #6   Title  Patient will complete lower body dressing with modified independence using adaptive equipment if needed.     Baseline  min assist at eval    Time  12    Period  Weeks    Status  New    Target Date  01/23/18            Plan - 11/11/17 1009    Clinical Impression Statement Pt.'s personal caregiver is now helping her into the shower because the transfer tub bench is taking too Bartnick to come. Pt. was taking sinkside pan baths. Pt. continues to work on improving UE strength, motor control, and coordination skills to improve functional use during ADLs, and IADLs.    Occupational performance deficits (Please refer to evaluation for details):  ADL's;Leisure    Rehab Potential  Good    OT Frequency  2x / week    OT Duration  12 weeks    OT Treatment/Interventions  Therapeutic exercise;Cognitive remediation/compensation;Functional Mobility Training    Plan  Positive:  motivation, family support.  Negative:  lives alone, legally blind, requires caregiver 7 days a week for a few hours a day for self care    Clinical Decision Making  Several treatment options, min-mod task modification necessary    Consulted and Agree with Plan of Care  Patient       Patient will benefit from skilled therapeutic intervention in order to improve the following deficits and impairments:  Decreased cognition, Decreased knowledge of use of DME, Impaired vision/preception, Pain, Decreased coordination, Decreased activity tolerance, Decreased endurance, Decreased strength, Decreased balance, Impaired UE functional use  Visit Diagnosis: Muscle weakness (generalized)  Other lack of coordination    Problem  List Patient  Active Problem List   Diagnosis Date Noted  . Lower extremity edema 04/15/2017  . Macrocytic anemia 02/11/2017  . Abscess, gluteal, left 05/07/2016  . Vitamin D deficiency 02/02/2016  . Mixed hyperlipidemia 01/03/2015  . MS (multiple sclerosis) (HCC) 01/03/2015  . Depression 01/03/2015  . Chronic pain 01/03/2015  . GERD without esophagitis 01/03/2015  . COPD (chronic obstructive pulmonary disease) (HCC) 01/03/2015  . Osteopenia 01/03/2015  . Neurogenic bladder 01/03/2015  . Incontinence in female 01/03/2015    Olegario Messier, MS, OTR/L 11/11/2017, 10:17 AM  [place] [facility] MAIN [facility] SERVICES [address] [place], [place], [address] Phone: [phone]   Fax:  [phone]  Name: [name] MRN: [id number] Date of Birth: [birth year]

## 2017-11-14 ENCOUNTER — Ambulatory Visit: Payer: Medicare Other | Admitting: Occupational Therapy

## 2017-11-14 ENCOUNTER — Encounter: Payer: Self-pay | Admitting: Occupational Therapy

## 2017-11-14 ENCOUNTER — Other Ambulatory Visit: Payer: Self-pay

## 2017-11-14 DIAGNOSIS — M6281 Muscle weakness (generalized): Secondary | ICD-10-CM | POA: Diagnosis not present

## 2017-11-14 DIAGNOSIS — R278 Other lack of coordination: Secondary | ICD-10-CM | POA: Diagnosis not present

## 2017-11-14 NOTE — Therapy (Signed)
[place] [facility] MAIN [facility] SERVICES [address] [place], [place], [address] Phone: [phone]   Fax:  [phone]  Occupational Therapy Treatment  Patient Details  Name: [name] MRN: [id number] Date of Birth: 1956/03/28 No data recorded  Encounter Date: 11/14/2017  OT End of Session - 11/14/17 1201    Visit Number  6    Number of Visits  24    Date for OT Re-Evaluation  01/23/18    OT Start Time  1100    OT Stop Time  1145    OT Time Calculation (min)  45 min    Activity Tolerance  Patient tolerated treatment well    Behavior During Therapy  [facility] for tasks assessed/performed       Past Medical History:  Diagnosis Date  . Depression   . Headache   . Hyperlipidemia   . MS (multiple sclerosis) (HCC) 1990  . Neurogenic bladder   . Osteoporosis     Past Surgical History:  Procedure Laterality Date  . ABDOMINAL HYSTERECTOMY    . OOPHORECTOMY Bilateral     There were no vitals filed for this visit.  Subjective Assessment - 11/14/17 1156    Subjective   Pt reports that her left leg feels really weak today.    Pertinent History  Patient reports she was diagnosed with MS years ago and has recently started to feel weak in her left dominant arm and hand.  She denies any previous therapy and reports memory issues and can provide a limited history.     Patient Stated Goals  Patient reports she wants to be able to walk better, use her left hand and be able to write again.     Currently in Pain?  Yes    Pain Score  4     Pain Location  Arm    Pain Orientation  Left    Pain Descriptors / Indicators  Aching    Pain Type  Chronic pain    Pain Onset  More than a month ago    Pain Frequency  Constant    Aggravating Factors   reaching up high    Pain Relieving Factors  rest     Effect of Pain on Daily Activities  needs more rest breaks    Multiple Pain Sites  No                   OT Treatments/Exercises (OP) - 11/14/17 1157       ADLs   Writing  Pt seen for handwriting and practices different strokes and letters.  Tried weighted pencil to see if this would improve shakiness and legibility but it did not.  Pt did better with the built up ball point pen with legibility 25% today.  Increased tremor in L hand today.      Neurological Re-education Exercises   Other Exercises 1  Pt participated in yellow theraputty exercises using several different tools to incorporate pushing, pulling and mild twisting motions with coordination.  Rest break needed after 10 minutes and practiced weight bearing to help L hand relax.     Other Exercises 2  Practiced flipping deck of cards using L hand to flip one card at a time and incoroprate speed and shuffling.  Improved performance using smaller stack for shuffling.                  OT Brinker Term Goals - 10/31/17 1111  OT Archey TERM GOAL #1   Title  Patient will demonstrate holding utensils in her left hand and feeding herself with modified independence with minimal spillage.      Baseline  unable to use left domin hand for self feeding at eval    Time  8    Period  Weeks    Status  New    Target Date  12/26/17      OT Urbani TERM GOAL #2   Title  Patient wil complete tub/shower transfer with supervision only.    Baseline  min assist at eval     Time  8    Period  Weeks    Status  New    Target Date  12/26/17      OT Gail TERM GOAL #3   Title  Patient will complete bathing UB and LB with supervision only.     Baseline  min assist at eval    Time  12    Period  Weeks    Status  New    Target Date  01/23/18      OT Monforte TERM GOAL #4   Title  Patient will increase left UE strength by 1 mm grade to be able to reach into closet to retrieve clothing items.     Baseline  requires assist from aide, 3-/5 strength at eval    Time  12    Period  Weeks    Status  New    Target Date  01/23/18      OT Weirauch TERM GOAL #5   Title  Patient will improve coordination on  the left to be able to hold pen securely and write her name with 75% legibility.     Baseline  unable at eval    Time  12    Period  Weeks    Status  New    Target Date  01/23/18      Ozer Term Additional Goals   Additional Kintzel Term Goals  Yes      OT Worrall TERM GOAL #6   Title  Patient will complete lower body dressing with modified independence using adaptive equipment if needed.     Baseline  min assist at eval    Time  12    Period  Weeks    Status  New    Target Date  01/23/18            Plan - 11/14/17 1201    Clinical Impression Statement  Pt demonstrated good participation today despite feeling more tired and her left leg feeling weak and L hand had more tremors after first half of session.  She continues to be motivated to increase functional use of UE and increase control, strength and use for ADLs and IADls.    Occupational Profile and client history currently impacting functional performance  patient is legally blind, progressive disease process with MS for over 20+ years, lives alone, limited availability for transportation.      Occupational performance deficits (Please refer to evaluation for details):  ADL's;Leisure    Rehab Potential  Good    OT Frequency  2x / week    OT Duration  12 weeks    OT Treatment/Interventions  Therapeutic exercise;Cognitive remediation/compensation;Functional Mobility Training    Plan  Positive:  motivation, family support.  Negative:  lives alone, legally blind, requires caregiver 7 days a week for a few hours a day for self care    Clinical Decision  Making  Several treatment options, min-mod task modification necessary    Consulted and Agree with Plan of Care  Patient       Patient will benefit from skilled therapeutic intervention in order to improve the following deficits and impairments:  Decreased cognition, Decreased knowledge of use of DME, Impaired vision/preception, Pain, Decreased coordination, Decreased activity  tolerance, Decreased endurance, Decreased strength, Decreased balance, Impaired UE functional use  Visit Diagnosis: Muscle weakness (generalized)  Other lack of coordination    Problem List Patient Active Problem List   Diagnosis Date Noted  . Lower extremity edema 04/15/2017  . Macrocytic anemia 02/11/2017  . Abscess, gluteal, left 05/07/2016  . Vitamin D deficiency 02/02/2016  . Mixed hyperlipidemia 01/03/2015  . MS (multiple sclerosis) (HCC) 01/03/2015  . Depression 01/03/2015  . Chronic pain 01/03/2015  . GERD without esophagitis 01/03/2015  . COPD (chronic obstructive pulmonary disease) (HCC) 01/03/2015  . Osteopenia 01/03/2015  . Neurogenic bladder 01/03/2015  . Incontinence in female 01/03/2015    Susanne Borders, OTR/L ascom [phone] 11/14/17, 12:04 PM  [facility] MAIN [facility] SERVICES [address] [place], [place], [address] Phone: [phone]   Fax:  [phone]  Name: [name] MRN: [id number] Date of Birth: [birth year]

## 2017-11-18 ENCOUNTER — Ambulatory Visit: Payer: Medicare Other | Admitting: Occupational Therapy

## 2017-11-20 ENCOUNTER — Ambulatory Visit: Payer: Medicare Other | Admitting: Occupational Therapy

## 2017-11-20 DIAGNOSIS — M6281 Muscle weakness (generalized): Secondary | ICD-10-CM | POA: Diagnosis not present

## 2017-11-20 DIAGNOSIS — R278 Other lack of coordination: Secondary | ICD-10-CM

## 2017-11-21 ENCOUNTER — Encounter: Payer: Self-pay | Admitting: Occupational Therapy

## 2017-11-21 NOTE — Therapy (Signed)
[place] [facility] MAIN [facility] SERVICES [address] [place], [place], [address] Phone: [phone]   Fax:  [phone]  Occupational Therapy Treatment  Patient Details  Name: [name] MRN: [id number] Date of Birth: 1955/11/07 No data recorded  Encounter Date: 11/20/2017  OT End of Session - 11/21/17 1659    Visit Number  7    Number of Visits  24    Date for OT Re-Evaluation  01/23/18    OT Start Time  1300    OT Stop Time  1355    OT Time Calculation (min)  55 min    Activity Tolerance  Patient tolerated treatment well    Behavior During Therapy  [facility] for tasks assessed/performed       Past Medical History:  Diagnosis Date  . Depression   . Headache   . Hyperlipidemia   . MS (multiple sclerosis) (HCC) 1990  . Neurogenic bladder   . Osteoporosis     Past Surgical History:  Procedure Laterality Date  . ABDOMINAL HYSTERECTOMY    . OOPHORECTOMY Bilateral     There were no vitals filed for this visit.  Subjective Assessment - 11/21/17 1657    Subjective   Patient and would like to work more on her handwriting skills during therapy. She reports she was able to use her left dominant in today to sign in for therapy.    Pertinent History  Patient reports she was diagnosed with MS years ago and has recently started to feel weak in her left dominant arm and hand.  She denies any previous therapy and reports memory issues and can provide a limited history.     Patient Stated Goals  Patient reports she wants to be able to walk better, use her left hand and be able to write again.     Currently in Pain?  No/denies    Pain Score  0-No pain    Multiple Pain Sites  No                   OT Treatments/Exercises (OP) - 11/21/17 1657      ADLs   ADL Comments  Patient seen for handwriting activities with use of adaptive and performed best with easy grip red built up pen. Legibility was slightly improved and will need additional focus  in this area to improve legibility.       Neurological Re-education Exercises   Other Exercises 1  Patient was seen by a extremity strength and exercises with red theraband to perform shoulder flexion, abduction, elbow flexion and extension for two sets of 10 repetitions with cues for form and technique, perform from a seated position. Patient seen for resistive pinch to perform lateral And three point pinches to increase strength and ROM with active reaching patterns. Patient demonstrated difficulty with black pinch pins Especially when attempting to place it onto an elevated surface.      Other Exercises 2  Patient seen Purdue pegboard pieces, patient was able to pick up small dowel pieces with her left hand however had difficulty washers and collars and was unable to complete full assembly.             OT Education - 11/21/17 1659    Education provided  Yes    Education Details  handwriting, ez grip pen    Methods  Explanation;Demonstration;Verbal cues    Comprehension  Verbal cues required;Returned demonstration;Verbalized understanding  OT Pinon Term Goals - 10/31/17 1111      OT Sheley TERM GOAL #1   Title  Patient will demonstrate holding utensils in her left hand and feeding herself with modified independence with minimal spillage.      Baseline  unable to use left domin hand for self feeding at eval    Time  8    Period  Weeks    Status  New    Target Date  12/26/17      OT Calzadilla TERM GOAL #2   Title  Patient wil complete tub/shower transfer with supervision only.    Baseline  min assist at eval     Time  8    Period  Weeks    Status  New    Target Date  12/26/17      OT Hurston TERM GOAL #3   Title  Patient will complete bathing UB and LB with supervision only.     Baseline  min assist at eval    Time  12    Period  Weeks    Status  New    Target Date  01/23/18      OT Leffel TERM GOAL #4   Title  Patient will increase left UE strength by 1 mm grade to be  able to reach into closet to retrieve clothing items.     Baseline  requires assist from aide, 3-/5 strength at eval    Time  12    Period  Weeks    Status  New    Target Date  01/23/18      OT Cai TERM GOAL #5   Title  Patient will improve coordination on the left to be able to hold pen securely and write her name with 75% legibility.     Baseline  unable at eval    Time  12    Period  Weeks    Status  New    Target Date  01/23/18      Leh Term Additional Goals   Additional Shahan Term Goals  Yes      OT Wickard TERM GOAL #6   Title  Patient will complete lower body dressing with modified independence using adaptive equipment if needed.     Baseline  min assist at eval    Time  12    Period  Weeks    Status  New    Target Date  01/23/18            Plan - 11/21/17 1658    Clinical Impression Statement  Patient continues to progress with her left hand function and now able to hold a pen with more secure grip and is starting to form letters with fair to poor legibility. She showed improvement today with easy grip red pen and would benefit from continued progress in this area to improve legibility. Continue difficulty with manipulation of small objects.    Occupational Profile and client history currently impacting functional performance  patient is legally blind, progressive disease process with MS for over 20+ years, lives alone, limited availability for transportation.      Occupational performance deficits (Please refer to evaluation for details):  ADL's;Leisure    Rehab Potential  Good    OT Frequency  2x / week    OT Duration  12 weeks    OT Treatment/Interventions  Therapeutic exercise;Cognitive remediation/compensation;Functional Mobility Training    Plan  Positive:  motivation, family support.  Negative:  lives  alone, legally blind, requires caregiver 7 days a week for a few hours a day for self care    Consulted and Agree with Plan of Care  Patient       Patient will  benefit from skilled therapeutic intervention in order to improve the following deficits and impairments:  Decreased cognition, Decreased knowledge of use of DME, Impaired vision/preception, Pain, Decreased coordination, Decreased activity tolerance, Decreased endurance, Decreased strength, Decreased balance, Impaired UE functional use  Visit Diagnosis: Muscle weakness (generalized)  Other lack of coordination    Problem List Patient Active Problem List   Diagnosis Date Noted  . Lower extremity edema 04/15/2017  . Macrocytic anemia 02/11/2017  . Abscess, gluteal, left 05/07/2016  . Vitamin D deficiency 02/02/2016  . Mixed hyperlipidemia 01/03/2015  . MS (multiple sclerosis) (HCC) 01/03/2015  . Depression 01/03/2015  . Chronic pain 01/03/2015  . GERD without esophagitis 01/03/2015  . COPD (chronic obstructive pulmonary disease) (HCC) 01/03/2015  . Osteopenia 01/03/2015  . Neurogenic bladder 01/03/2015  . Incontinence in female 01/03/2015   Amy T Arne Cleveland, OTR/L, CLT  Lovett,Amy 11/21/2017, 5:01 PM  [place] [facility] MAIN [facility] SERVICES [address] [place], [place], [address] Phone: [phone]   Fax:  [phone]  Name: [name] MRN: [id number] Date of Birth: [birth year]

## 2017-11-25 ENCOUNTER — Encounter: Payer: Self-pay | Admitting: Occupational Therapy

## 2017-11-25 ENCOUNTER — Ambulatory Visit: Payer: Medicare Other | Attending: Neurology | Admitting: Occupational Therapy

## 2017-11-25 DIAGNOSIS — M6281 Muscle weakness (generalized): Secondary | ICD-10-CM | POA: Diagnosis not present

## 2017-11-25 DIAGNOSIS — R278 Other lack of coordination: Secondary | ICD-10-CM | POA: Diagnosis not present

## 2017-11-25 NOTE — Therapy (Addendum)
[facility] [facility] MAIN [facility] SERVICES [address] [place], [place], [address] Phone: [phone]   Fax:  [phone]  Occupational Therapy Treatment  Patient Details  Name: [name] MRN: [id number] Date of Birth: Sep 08, 1955 No data recorded  Encounter Date: 11/25/2017  OT End of Session - 11/25/17 1453    Visit Number  7    Number of Visits  24    Date for OT Re-Evaluation  01/23/18    Authorization Type  visit 7/10 for progress report period starting 10/28/2017    OT Start Time  1445    OT Stop Time  1530    OT Time Calculation (min)  45 min    Activity Tolerance  Patient tolerated treatment well    Behavior During Therapy  [facility] for tasks assessed/performed       Past Medical History:  Diagnosis Date  . Depression   . Headache   . Hyperlipidemia   . MS (multiple sclerosis) (HCC) 1990  . Neurogenic bladder   . Osteoporosis     Past Surgical History:  Procedure Laterality Date  . ABDOMINAL HYSTERECTOMY    . OOPHORECTOMY Bilateral     There were no vitals filed for this visit.  Subjective Assessment - 11/25/17 1451    Subjective   Pt. reports she is having a new onset of urinary frequency.    Pertinent History  Patient reports she was diagnosed with MS years ago and has recently started to feel weak in her left dominant arm and hand.  She denies any previous therapy and reports memory issues and can provide a limited history.     Patient Stated Goals  Patient reports she wants to be able to walk better, use her left hand and be able to write again.     Currently in Pain?  No/denies       OT TREATMENT    Pt. was upgraded, and provided with pink theraputty.  Neuro muscular re-education:  Pt. worked on grasping, and flipping 2" large pegs on the Instructo board placed at a tabletop surface. Pt. performed [facility] tasks using the Grooved pegboard. Pt. Attempted to work on grasping the grooved pegs from a horizontal position, and moving  the pegs to a vertical position in the hand to prepare for placing them in the grooved slot. Pt. required cues for stabilization proximally. Pt. worked on prewriting exercise, and tasks however had difficulty formulating, and connecting circles.                                OT Woodring Term Goals - 10/31/17 1111      OT Hargan TERM GOAL #1   Title  Patient will demonstrate holding utensils in her left hand and feeding herself with modified independence with minimal spillage.      Baseline  unable to use left domin hand for self feeding at eval    Time  8    Period  Weeks    Status  New    Target Date  12/26/17      OT Boy TERM GOAL #2   Title  Patient wil complete tub/shower transfer with supervision only.    Baseline  min assist at eval     Time  8    Period  Weeks    Status  New    Target Date  12/26/17      OT Hillmann TERM  GOAL #3   Title  Patient will complete bathing UB and LB with supervision only.     Baseline  min assist at eval    Time  12    Period  Weeks    Status  New    Target Date  01/23/18      OT Nourse TERM GOAL #4   Title  Patient will increase left UE strength by 1 mm grade to be able to reach into closet to retrieve clothing items.     Baseline  requires assist from aide, 3-/5 strength at eval    Time  12    Period  Weeks    Status  New    Target Date  01/23/18      OT Serratore TERM GOAL #5   Title  Patient will improve coordination on the left to be able to hold pen securely and write her name with 75% legibility.     Baseline  unable at eval    Time  12    Period  Weeks    Status  New    Target Date  01/23/18      Caprio Term Additional Goals   Additional Skiver Term Goals  Yes      OT Wessner TERM GOAL #6   Title  Patient will complete lower body dressing with modified independence using adaptive equipment if needed.     Baseline  min assist at eval    Time  12    Period  Weeks    Status  New    Target Date  01/23/18             Plan - 11/25/17 1454    Clinical Impression Statement  Pt. reports she is having a new onset of urinary frequency. Pt. continues to present with limited UE strength, and coordination skills. Pt. requires verbal cues and visual cue for hand position, and stabilizing proximally for increased stabilization, and motor control. Pt. continues to work on improving UE strength, and coordination skills.     Occupational Profile and client history currently impacting functional performance  patient is legally blind, progressive disease process with MS for over 20+ years, lives alone, limited availability for transportation.      Occupational performance deficits (Please refer to evaluation for details):  ADL's;Leisure    Rehab Potential  Good    OT Frequency  2x / week    OT Duration  12 weeks    OT Treatment/Interventions  Therapeutic exercise;Cognitive remediation/compensation;Functional Mobility Training    Plan  Positive:  motivation, family support.  Negative:  lives alone, legally blind, requires caregiver 7 days a week for a few hours a day for self care    Clinical Decision Making  Several treatment options, min-mod task modification necessary    Consulted and Agree with Plan of Care  Patient       Patient will benefit from skilled therapeutic intervention in order to improve the following deficits and impairments:  Decreased cognition, Decreased knowledge of use of DME, Impaired vision/preception, Pain, Decreased coordination, Decreased activity tolerance, Decreased endurance, Decreased strength, Decreased balance, Impaired UE functional use  Visit Diagnosis: Muscle weakness (generalized)  Other lack of coordination    Problem List Patient Active Problem List   Diagnosis Date Noted  . Lower extremity edema 04/15/2017  . Macrocytic anemia 02/11/2017  . Abscess, gluteal, left 05/07/2016  . Vitamin D deficiency 02/02/2016  . Mixed hyperlipidemia 01/03/2015  . MS (multiple  sclerosis) (HCC)  01/03/2015  . Depression 01/03/2015  . Chronic pain 01/03/2015  . GERD without esophagitis 01/03/2015  . COPD (chronic obstructive pulmonary disease) (HCC) 01/03/2015  . Osteopenia 01/03/2015  . Neurogenic bladder 01/03/2015  . Incontinence in female 01/03/2015    Olegario Messier, MS, OTR/L 11/25/2017, 3:26 PM  [place] [facility] MAIN [facility] SERVICES [address] [place], [place], [address] Phone: [phone]   Fax:  [phone]  Name: NEHA WAIGHT MRN: [id number] Date of Birth: [birth year]

## 2017-11-27 ENCOUNTER — Encounter: Payer: Self-pay | Admitting: Occupational Therapy

## 2017-11-27 ENCOUNTER — Ambulatory Visit: Payer: Medicare Other | Admitting: Occupational Therapy

## 2017-11-27 DIAGNOSIS — M6281 Muscle weakness (generalized): Secondary | ICD-10-CM

## 2017-11-27 DIAGNOSIS — R278 Other lack of coordination: Secondary | ICD-10-CM | POA: Diagnosis not present

## 2017-11-27 NOTE — Therapy (Signed)
[place] [facility] MAIN [facility] SERVICES [address] [place], [place], [address] Phone: [phone]   Fax:  [phone]  Occupational Therapy Treatment  Patient Details  Name: [name] MRN: [id number] Date of Birth: 07-Jul-1955 No data recorded  Encounter Date: 11/27/2017  OT End of Session - 11/27/17 1149    Visit Number  8    Number of Visits  24    Date for OT Re-Evaluation  01/23/18    Authorization Type  visit 8/10 for progress report period starting 10/28/2017    OT Start Time  1130    OT Stop Time  1215    OT Time Calculation (min)  45 min    Activity Tolerance  Patient tolerated treatment well    Behavior During Therapy  [facility] for tasks assessed/performed       Past Medical History:  Diagnosis Date  . Depression   . Headache   . Hyperlipidemia   . MS (multiple sclerosis) (HCC) 1990  . Neurogenic bladder   . Osteoporosis     Past Surgical History:  Procedure Laterality Date  . ABDOMINAL HYSTERECTOMY    . OOPHORECTOMY Bilateral     There were no vitals filed for this visit.  Subjective Assessment - 11/27/17 1148    Subjective   Pt. reports that she continues to have urinary frequency    Pertinent History  Patient reports she was diagnosed with MS years ago and has recently started to feel weak in her left dominant arm and hand.  She denies any previous therapy and reports memory issues and can provide a limited history.     Patient Stated Goals  Patient reports she wants to be able to walk better, use her left hand and be able to write again.     Currently in Pain?  No/denies      OT TREATMENT    Neuro muscular re-education:  Pt. worked on Diplomatic Services operational officer tasks, writing legibility, and formulating letters in her name. Pt. worked on challenging writing her name in smaller spaces. Pt. Education was provided about forearm support, and proximal stability to improve distal control. Pt. worked on grasping, flipping, turning, and stacking  [place] style discs. Pt. required visual demonstration, and cues for movement patterns. Pt. worked on speed, and Engineer, agricultural. Pt. Worked on grasping coins from a tabletop surface, placing them into a resistive container, and pushing them through the slot while isolating his 2nd digit.                          OT Education - 11/27/17 1149    Education provided  Yes    Education Details  handwriting, ez grip pen    Methods  Explanation;Demonstration;Verbal cues    Comprehension  Verbal cues required;Returned demonstration;Verbalized understanding    Education Details  BUE strength, FMC    Person(s) Educated  Patient    Methods  Explanation;Demonstration;Verbal cues    Comprehension  Verbalized understanding;Returned demonstration          OT Charlson Term Goals - 10/31/17 1111      OT Jodoin TERM GOAL #1   Title  Patient will demonstrate holding utensils in her left hand and feeding herself with modified independence with minimal spillage.      Baseline  unable to use left domin hand for self feeding at eval    Time  8    Period  Weeks    Status  New  Target Date  12/26/17      OT Schnapp TERM GOAL #2   Title  Patient wil complete tub/shower transfer with supervision only.    Baseline  min assist at eval     Time  8    Period  Weeks    Status  New    Target Date  12/26/17      OT Farney TERM GOAL #3   Title  Patient will complete bathing UB and LB with supervision only.     Baseline  min assist at eval    Time  12    Period  Weeks    Status  New    Target Date  01/23/18      OT Madia TERM GOAL #4   Title  Patient will increase left UE strength by 1 mm grade to be able to reach into closet to retrieve clothing items.     Baseline  requires assist from aide, 3-/5 strength at eval    Time  12    Period  Weeks    Status  New    Target Date  01/23/18      OT Bienkowski TERM GOAL #5   Title  Patient will improve coordination on the left to be able to hold  pen securely and write her name with 75% legibility.     Baseline  unable at eval    Time  12    Period  Weeks    Status  New    Target Date  01/23/18      Szczesny Term Additional Goals   Additional Tippen Term Goals  Yes      OT Pettey TERM GOAL #6   Title  Patient will complete lower body dressing with modified independence using adaptive equipment if needed.     Baseline  min assist at eval    Time  12    Period  Weeks    Status  New    Target Date  01/23/18            Plan - 11/27/17 1150    Clinical Impression Statement  Pt. reprorts that she continues to have urinary frequency. Pt. has an appointment set up with her physician, however does not know when the appontment is. Pt. presents with limited UE strength, and fine coordination skills. Pt. continues to work on improving motor control, and coordination skills for improved hand writing tasks. Pt. worked Surveyor, minerals, and writing her name while challeged in smaller spaces.    Occupational Profile and client history currently impacting functional performance  patient is legally blind, progressive disease process with MS for over 20+ years, lives alone, limited availability for transportation.      Occupational performance deficits (Please refer to evaluation for details):  ADL's;Leisure    Rehab Potential  Good    OT Frequency  2x / week    OT Duration  12 weeks    OT Treatment/Interventions  Therapeutic exercise;Cognitive remediation/compensation;Functional Mobility Training    Plan  Positive:  motivation, family support.  Negative:  lives alone, legally blind, requires caregiver 7 days a week for a few hours a day for self care    Clinical Decision Making  Several treatment options, min-mod task modification necessary    Consulted and Agree with Plan of Care  Patient       Patient will benefit from skilled therapeutic intervention in order to improve the following deficits and impairments:  Decreased cognition,  Decreased knowledge  of use of DME, Impaired vision/preception, Pain, Decreased coordination, Decreased activity tolerance, Decreased endurance, Decreased strength, Decreased balance, Impaired UE functional use  Visit Diagnosis: Muscle weakness (generalized)  Other lack of coordination    Problem List Patient Active Problem List   Diagnosis Date Noted  . Lower extremity edema 04/15/2017  . Macrocytic anemia 02/11/2017  . Abscess, gluteal, left 05/07/2016  . Vitamin D deficiency 02/02/2016  . Mixed hyperlipidemia 01/03/2015  . MS (multiple sclerosis) (HCC) 01/03/2015  . Depression 01/03/2015  . Chronic pain 01/03/2015  . GERD without esophagitis 01/03/2015  . COPD (chronic obstructive pulmonary disease) (HCC) 01/03/2015  . Osteopenia 01/03/2015  . Neurogenic bladder 01/03/2015  . Incontinence in female 01/03/2015    Olegario Messier, MS, OTR/L 11/27/2017, 11:59 AM  [place] [facility] MAIN [facility] SERVICES [address] [place], [place], [address] Phone: [phone]   Fax:  [phone]  Name: [name] MRN: [id number] Date of Birth: 1956/02/15

## 2017-12-02 ENCOUNTER — Ambulatory Visit: Payer: Medicare Other | Admitting: Occupational Therapy

## 2017-12-02 ENCOUNTER — Other Ambulatory Visit: Payer: Self-pay

## 2017-12-02 ENCOUNTER — Encounter: Payer: Self-pay | Admitting: Occupational Therapy

## 2017-12-02 DIAGNOSIS — R278 Other lack of coordination: Secondary | ICD-10-CM

## 2017-12-02 DIAGNOSIS — M6281 Muscle weakness (generalized): Secondary | ICD-10-CM | POA: Diagnosis not present

## 2017-12-02 NOTE — Therapy (Signed)
[facility] MAIN [facility] SERVICES [address] [place], [place], [address] Phone: [phone]   Fax:  [phone]  Occupational Therapy Treatment  Patient Details  Name: [name] MRN: [id number] Date of Birth: 1956-04-18 No data recorded  Encounter Date: 12/02/2017  OT End of Session - 12/02/17 1304    Visit Number  9    Number of Visits  24    Date for OT Re-Evaluation  01/23/18    OT Start Time  1115    OT Stop Time  1200    OT Time Calculation (min)  45 min    Activity Tolerance  Patient limited by fatigue    Behavior During Therapy  [facility] for tasks assessed/performed       Past Medical History:  Diagnosis Date  . Depression   . Headache   . Hyperlipidemia   . MS (multiple sclerosis) (HCC) 1990  . Neurogenic bladder   . Osteoporosis     Past Surgical History:  Procedure Laterality Date  . ABDOMINAL HYSTERECTOMY    . OOPHORECTOMY Bilateral     There were no vitals filed for this visit.  Subjective Assessment - 12/02/17 1255    Subjective   Pt reports that she is feeling weaker today.    Pertinent History  Patient reports she was diagnosed with MS years ago and has recently started to feel weak in her left dominant arm and hand.  She denies any previous therapy and reports memory issues and can provide a limited history.     Patient Stated Goals  Patient reports she wants to be able to walk better, use her left hand and be able to write again.     Currently in Pain?  No/denies    Pain Score  4     Pain Location  Arm    Pain Orientation  Right;Left    Pain Descriptors / Indicators  Aching    Pain Type  Chronic pain    Pain Onset  More than a month ago    Pain Frequency  Constant    Aggravating Factors   reaching up high    Pain Relieving Factors  rest    Effect of Pain on Daily Activities  needs more rest breaks    Multiple Pain Sites  No                   OT Treatments/Exercises (OP) - 12/02/17 1259       ADLs   Writing  Pt seen for handwriting exercises today but had difficulty with seeing lines today even when made red vs black and her usual yellow built-up pen wasn't helping either.  Tried larger red pen with about 3 inch circumference but her control did not improve and she stated her hands and arms felt really weak today so activity was discontinued.        Neurological Re-education Exercises   Other Exercises 1  Pt participated in yellow theraputty exercises for about 10 minutes until her hands fatigued with this task as well.  Pt given rest break and then praticed pushing small red pins into corkboard to work on isolated finger movements and moving pins from palm of hand to fingers.  One rest break needed before removing pins from corkboard.  Pt started to compensate by using 3pt pinch vs 2 pt for last few minutes when starting to fatigue.  She reported a burning sensation for a few minutes during task but had  dissipated by end of session.                  OT Lobban Term Goals - 10/31/17 1111      OT Kauer TERM GOAL #1   Title  Patient will demonstrate holding utensils in her left hand and feeding herself with modified independence with minimal spillage.      Baseline  unable to use left domin hand for self feeding at eval    Time  8    Period  Weeks    Status  New    Target Date  12/26/17      OT Braman TERM GOAL #2   Title  Patient wil complete tub/shower transfer with supervision only.    Baseline  min assist at eval     Time  8    Period  Weeks    Status  New    Target Date  12/26/17      OT Ciancio TERM GOAL #3   Title  Patient will complete bathing UB and LB with supervision only.     Baseline  min assist at eval    Time  12    Period  Weeks    Status  New    Target Date  01/23/18      OT Winkleman TERM GOAL #4   Title  Patient will increase left UE strength by 1 mm grade to be able to reach into closet to retrieve clothing items.     Baseline  requires assist from  aide, 3-/5 strength at eval    Time  12    Period  Weeks    Status  New    Target Date  01/23/18      OT Toor TERM GOAL #5   Title  Patient will improve coordination on the left to be able to hold pen securely and write her name with 75% legibility.     Baseline  unable at eval    Time  12    Period  Weeks    Status  New    Target Date  01/23/18      Vivian Term Additional Goals   Additional Schow Term Goals  Yes      OT Schaus TERM GOAL #6   Title  Patient will complete lower body dressing with modified independence using adaptive equipment if needed.     Baseline  min assist at eval    Time  12    Period  Weeks    Status  New    Target Date  01/23/18            Plan - 12/02/17 1304    Clinical Impression Statement  Pt was eager to participate today but had more weakness in BUEs and hands today with some burning as she fatigued but with rest breaks and change in activities she did well and was reminded to take more breaks due to having MS.  She continues to work on improving fine motor control  control, coordiantion and hand writing tasks.     Occupational Profile and client history currently impacting functional performance  patient is legally blind, progressive disease process with MS for over 20+ years, lives alone, limited availability for transportation.      Occupational performance deficits (Please refer to evaluation for details):  ADL's;Leisure    Rehab Potential  Good    OT Frequency  2x / week    OT Duration  12 weeks  OT Treatment/Interventions  Therapeutic exercise;Cognitive remediation/compensation;Functional Mobility Training    Plan  Positive:  motivation, family support.  Negative:  lives alone, legally blind, requires caregiver 7 days a week for a few hours a day for self care    Clinical Decision Making  Several treatment options, min-mod task modification necessary    Consulted and Agree with Plan of Care  Patient       Patient will benefit from skilled  therapeutic intervention in order to improve the following deficits and impairments:  Decreased cognition, Decreased knowledge of use of DME, Impaired vision/preception, Pain, Decreased coordination, Decreased activity tolerance, Decreased endurance, Decreased strength, Decreased balance, Impaired UE functional use  Visit Diagnosis: Muscle weakness (generalized)  Other lack of coordination    Problem List Patient Active Problem List   Diagnosis Date Noted  . Lower extremity edema 04/15/2017  . Macrocytic anemia 02/11/2017  . Abscess, gluteal, left 05/07/2016  . Vitamin D deficiency 02/02/2016  . Mixed hyperlipidemia 01/03/2015  . MS (multiple sclerosis) (HCC) 01/03/2015  . Depression 01/03/2015  . Chronic pain 01/03/2015  . GERD without esophagitis 01/03/2015  . COPD (chronic obstructive pulmonary disease) (HCC) 01/03/2015  . Osteopenia 01/03/2015  . Neurogenic bladder 01/03/2015  . Incontinence in female 01/03/2015    Susanne Borders, OTR/L ascom [phone] 12/02/17, 1:18 PM  [facility] MAIN [facility] SERVICES [address] [place], [place], [address] Phone: [phone]   Fax:  [phone]  Name: ARIAM MOL MRN: [id number] Date of Birth: 1956-01-19

## 2017-12-04 ENCOUNTER — Ambulatory Visit: Payer: Medicare Other | Admitting: Occupational Therapy

## 2017-12-04 ENCOUNTER — Encounter: Payer: Self-pay | Admitting: Occupational Therapy

## 2017-12-04 ENCOUNTER — Other Ambulatory Visit: Payer: Self-pay

## 2017-12-04 DIAGNOSIS — R278 Other lack of coordination: Secondary | ICD-10-CM | POA: Diagnosis not present

## 2017-12-04 DIAGNOSIS — M6281 Muscle weakness (generalized): Secondary | ICD-10-CM

## 2017-12-04 NOTE — Therapy (Signed)
[facility] MAIN [facility] SERVICES [address] [place], [place], [address] Phone: [phone]   Fax:  [phone]  Occupational Therapy Treatment  Patient Details  Name: [name] MRN: [id number] Date of Birth: 1955-11-02 No data recorded  Encounter Date: 12/04/2017  OT End of Session - 12/04/17 1216    Visit Number  10    Number of Visits  24    Date for OT Re-Evaluation  01/23/18    OT Start Time  1115    OT Stop Time  1200    OT Time Calculation (min)  45 min    Equipment Utilized During Treatment  adaptive pens    Activity Tolerance  Patient tolerated treatment well    Behavior During Therapy  W[facility] for tasks assessed/performed       Past Medical History:  Diagnosis Date  . Depression   . Headache   . Hyperlipidemia   . MS (multiple sclerosis) (HCC) 1990  . Neurogenic bladder   . Osteoporosis     Past Surgical History:  Procedure Laterality Date  . ABDOMINAL HYSTERECTOMY    . OOPHORECTOMY Bilateral     There were no vitals filed for this visit.  Subjective Assessment - 12/04/17 1214    Subjective   Pt reports that she is doing better today compared to Tuesday.    Pertinent History  Patient reports she was diagnosed with MS years ago and has recently started to feel weak in her left dominant arm and hand.  She denies any previous therapy and reports memory issues and can provide a limited history.     Patient Stated Goals  Patient reports she wants to be able to walk better, use her left hand and be able to write again.     Currently in Pain?  No/denies       Neuro muscular re-education:  Pt. worked on Diplomatic Services operational officer tasks, writing legibility, and formulating letters in her name and names of family members using lined paper and red, adaptive pen. Pt. worked on Theme park manager in smaller spaces. Pt. Education was provided about forearm support, and proximal stability to improve distal control. Pt. worked on grasping,  flipping, turning, and pushing a variety of PVC pipe tubing pieces to follow visual pattern and a pattern of her choice. Pt. required visual demonstration, and cues for movement patterns. Pt. worked on speed, and Engineer, agricultural. Pt. worked on grasping grooved peg board pieces with difficulty adjusting the notch correctly and needed mod cues and assist to complete 4 pegs.                            OT Rail Term Goals - 10/31/17 1111      OT Ohms TERM GOAL #1   Title  Patient will demonstrate holding utensils in her left hand and feeding herself with modified independence with minimal spillage.      Baseline  unable to use left domin hand for self feeding at eval    Time  8    Period  Weeks    Status  New    Target Date  12/26/17      OT Daman TERM GOAL #2   Title  Patient wil complete tub/shower transfer with supervision only.    Baseline  min assist at eval     Time  8    Period  Weeks    Status  New    Target  Date  12/26/17      OT Cranshaw TERM GOAL #3   Title  Patient will complete bathing UB and LB with supervision only.     Baseline  min assist at eval    Time  12    Period  Weeks    Status  New    Target Date  01/23/18      OT Ransford TERM GOAL #4   Title  Patient will increase left UE strength by 1 mm grade to be able to reach into closet to retrieve clothing items.     Baseline  requires assist from aide, 3-/5 strength at eval    Time  12    Period  Weeks    Status  New    Target Date  01/23/18      OT Sperbeck TERM GOAL #5   Title  Patient will improve coordination on the left to be able to hold pen securely and write her name with 75% legibility.     Baseline  unable at eval    Time  12    Period  Weeks    Status  New    Target Date  01/23/18      Bennison Term Additional Goals   Additional Beckmann Term Goals  Yes      OT Hamme TERM GOAL #6   Title  Patient will complete lower body dressing with modified independence using adaptive equipment if  needed.     Baseline  min assist at eval    Time  12    Period  Weeks    Status  New    Target Date  01/23/18            Plan - 12/04/17 1216    Clinical Impression Statement  Pt continues to be eager to regain use of L UE and hand and improve bilateral hand coordination as well.  Her L hand and UE were still shaky but moreso when on tabletop for writing exercises and fine motor vs when raising her arms up for PVC pipe tower activity involving reaching over head.  She is doing better with remembering to use L hand and take rest breaks as needed during activities and ADLs.  She continues to work on improving fine motor control, coordination and hand writing tasks.    Occupational Profile and client history currently impacting functional performance  patient is legally blind, progressive disease process with MS for over 20+ years, lives alone, limited availability for transportation.      Occupational performance deficits (Please refer to evaluation for details):  ADL's;Leisure    Rehab Potential  Good    OT Frequency  2x / week    OT Duration  12 weeks    OT Treatment/Interventions  Therapeutic exercise;Cognitive remediation/compensation;Functional Mobility Training    Plan  Positive:  motivation, family support.  Negative:  lives alone, legally blind, requires caregiver 7 days a week for a few hours a day for self care    Clinical Decision Making  Several treatment options, min-mod task modification necessary    Consulted and Agree with Plan of Care  Patient       Patient will benefit from skilled therapeutic intervention in order to improve the following deficits and impairments:  Decreased cognition, Decreased knowledge of use of DME, Impaired vision/preception, Pain, Decreased coordination, Decreased activity tolerance, Decreased endurance, Decreased strength, Decreased balance, Impaired UE functional use  Visit Diagnosis: Muscle weakness (generalized)  Other lack of  coordination    Problem List Patient Active Problem List   Diagnosis Date Noted  . Lower extremity edema 04/15/2017  . Macrocytic anemia 02/11/2017  . Abscess, gluteal, left 05/07/2016  . Vitamin D deficiency 02/02/2016  . Mixed hyperlipidemia 01/03/2015  . MS (multiple sclerosis) (HCC) 01/03/2015  . Depression 01/03/2015  . Chronic pain 01/03/2015  . GERD without esophagitis 01/03/2015  . COPD (chronic obstructive pulmonary disease) (HCC) 01/03/2015  . Osteopenia 01/03/2015  . Neurogenic bladder 01/03/2015  . Incontinence in female 01/03/2015    Susanne Borders, OTR/L ascom [phone] 12/04/17, 12:24 PM  [place] [facility] MAIN [facility] SERVICES [address] [place], [place], [address] Phone: [phone]   Fax:  [phone]  Name: [name] MRN: [id number] Date of Birth: 06/08/1956

## 2017-12-10 ENCOUNTER — Encounter: Payer: Self-pay | Admitting: Occupational Therapy

## 2017-12-10 ENCOUNTER — Ambulatory Visit: Payer: Medicare Other | Admitting: Occupational Therapy

## 2017-12-10 DIAGNOSIS — M6281 Muscle weakness (generalized): Secondary | ICD-10-CM

## 2017-12-10 DIAGNOSIS — R278 Other lack of coordination: Secondary | ICD-10-CM | POA: Diagnosis not present

## 2017-12-12 NOTE — Therapy (Signed)
[place] [facility] MAIN [facility] SERVICES [address] [place], [place], [address] Phone: [phone]   Fax:  [phone]  Occupational Therapy Treatment/Progress Update Reporting period 10-28-2017 to 12-10-2017 Patient Details  Name: [name] MRN: [id number] Date of Birth: Aug 17, 1955 No data recorded  Encounter Date: 12/10/2017  OT End of Session - 12/12/17 1915    Visit Number  11    Number of Visits  24    Date for OT Re-Evaluation  01/23/18    Authorization Type  visit 1/10 for progress report period starting 12/10/2017    OT Start Time  1428    OT Stop Time  1515    OT Time Calculation (min)  47 min    Activity Tolerance  Patient tolerated treatment well    Behavior During Therapy  [facility] for tasks assessed/performed       Past Medical History:  Diagnosis Date  . Depression   . Headache   . Hyperlipidemia   . MS (multiple sclerosis) (HCC) 1990  . Neurogenic bladder   . Osteoporosis     Past Surgical History:  Procedure Laterality Date  . ABDOMINAL HYSTERECTOMY    . OOPHORECTOMY Bilateral     There were no vitals filed for this visit.  Subjective Assessment - 12/12/17 1914    Subjective   Patient reports she is not having a great day, feels weaker today than other days.      Pertinent History  Patient reports she was diagnosed with MS years ago and has recently started to feel weak in her left dominant arm and hand.  She denies any previous therapy and reports memory issues and can provide a limited history.     Patient Stated Goals  Patient reports she wants to be able to walk better, use her left hand and be able to write again.     Currently in Pain?  Yes    Pain Score  4     Pain Location  Arm    Pain Orientation  Left    Pain Descriptors / Indicators  Aching    Pain Type  Chronic pain    Pain Onset  More than a month ago    Pain Frequency  Constant                   OT Treatments/Exercises (OP) - 12/12/17 0001       ADLs   Writing  Patient was seen for handwriting skills with large built up pen, left hand use for name and family names.  Legibility slowly improving but still has a lot of work to do on coordination of letter formation and size of letters.  She had more tremors this date than she did last session.      Neurological Re-education Exercises   Other Exercises 1  Patient seen for LUE strengthening with hand gripper for sustained grip to pick up item and place into container, 25 repetitions for 2 sets, cues for hand placement on gripper.     Other Exercises 2  Manipulation of [place] discs with left hand for multiple trials, tremors worse this date than other sessions.  difficulty stacking 2 objects on top of each other.  Cues for manipulation skills. Then picked up 2 at a time stacked to put into container.              OT Education - 12/12/17 1915    Education provided  Yes    Education Details  goals, pen grip, HEP    Methods  Explanation;Demonstration;Verbal cues    Comprehension  Verbal cues required;Returned demonstration;Verbalized understanding          OT Burruss Term Goals - 12/12/17 1930      OT Kempen TERM GOAL #1   Title  Patient will demonstrate holding utensils in her left hand and feeding herself with modified independence with minimal spillage.      Baseline  unable to use left domin hand for self feeding at eval    Time  8    Period  Weeks    Status  On-going      OT Hanrahan TERM GOAL #2   Title  Patient wil complete tub/shower transfer with supervision only.    Baseline  min assist at eval     Time  8    Period  Weeks    Status  On-going      OT Rajkumar TERM GOAL #3   Title  Patient will complete bathing UB and LB with supervision only.     Baseline  min assist at eval; modified independent on 12-10-17    Time  12    Period  Weeks    Status  Achieved      OT Novoa TERM GOAL #4   Title  Patient will increase left UE strength by 1 mm grade to be able to reach  into closet to retrieve clothing items.     Baseline  requires assist from aide, 3-/5 strength at eval    Time  12    Period  Weeks    Status  On-going      OT Schafer TERM GOAL #5   Title  Patient will improve coordination on the left to be able to hold pen securely and write her name with 75% legibility.     Baseline  unable at eval; 6-19 able to hold pen and form letters but decreased legibility    Time  12    Period  Weeks    Status  On-going      OT Ozaki TERM GOAL #6   Title  Patient will complete lower body dressing with modified independence using adaptive equipment if needed.     Baseline  min assist at eval;  able to get pants on, difficulty with tying shoes 6/19    Time  12    Period  Weeks    Status  On-going            Plan - 12/12/17 1928    Clinical Impression Statement  Patient with increased tremors this date and had difficulty with handwriting and legibility.  She reports some days are better than others.  She continues to work towards improving strength, coordination and working towards greater independence with self care tasks.  She continues to benefit from skilled OT to maximize safety and independence in daily tasks.       Occupational Profile and client history currently impacting functional performance  patient is legally blind, progressive disease process with MS for over 20+ years, lives alone, limited availability for transportation.      Occupational performance deficits (Please refer to evaluation for details):  ADL's;Leisure    Rehab Potential  Good    OT Frequency  2x / week    OT Duration  12 weeks    OT Treatment/Interventions  Therapeutic exercise;Cognitive remediation/compensation;Functional Mobility Training    Consulted and Agree with Plan of Care  Patient  Patient will benefit from skilled therapeutic intervention in order to improve the following deficits and impairments:  Decreased cognition, Decreased knowledge of use of DME, Impaired  vision/preception, Pain, Decreased coordination, Decreased activity tolerance, Decreased endurance, Decreased strength, Decreased balance, Impaired UE functional use  Visit Diagnosis: Muscle weakness (generalized)  Other lack of coordination    Problem List Patient Active Problem List   Diagnosis Date Noted  . Lower extremity edema 04/15/2017  . Macrocytic anemia 02/11/2017  . Abscess, gluteal, left 05/07/2016  . Vitamin D deficiency 02/02/2016  . Mixed hyperlipidemia 01/03/2015  . MS (multiple sclerosis) (HCC) 01/03/2015  . Depression 01/03/2015  . Chronic pain 01/03/2015  . GERD without esophagitis 01/03/2015  . COPD (chronic obstructive pulmonary disease) (HCC) 01/03/2015  . Osteopenia 01/03/2015  . Neurogenic bladder 01/03/2015  . Incontinence in female 01/03/2015   Okla Qazi T Arne Cleveland, OTR/L, CLT  Juvencio Verdi 12/12/2017, 7:31 PM  [place] [facility] MAIN [facility] SERVICES [address] [place], [place], [address] Phone: [phone]   Fax:  [phone]  Name: [name] MRN: [id number] Date of Birth: 09/17/1955

## 2017-12-15 ENCOUNTER — Other Ambulatory Visit: Payer: Self-pay | Admitting: Family Medicine

## 2017-12-15 DIAGNOSIS — J432 Centrilobular emphysema: Secondary | ICD-10-CM

## 2017-12-19 ENCOUNTER — Ambulatory Visit: Payer: Medicare Other | Admitting: Occupational Therapy

## 2017-12-19 ENCOUNTER — Encounter: Payer: Self-pay | Admitting: Occupational Therapy

## 2017-12-19 DIAGNOSIS — M6281 Muscle weakness (generalized): Secondary | ICD-10-CM

## 2017-12-19 DIAGNOSIS — R278 Other lack of coordination: Secondary | ICD-10-CM

## 2017-12-19 NOTE — Therapy (Signed)
[place] [facility] MAIN [facility] SERVICES [address] [place], [place], [address] Phone: [phone]   Fax:  [phone]  Occupational Therapy Treatment  Patient Details  Name: [name] MRN: [id number] Date of Birth: September 19, 1955 No data recorded  Encounter Date: 12/19/2017  OT End of Session - 12/19/17 1028    Visit Number  12    Number of Visits  24    Date for OT Re-Evaluation  01/23/18    Authorization Type  visit 2/10 for progress report period starting 12/10/2017    OT Start Time  1020    OT Stop Time  1105    OT Time Calculation (min)  45 min    Activity Tolerance  Patient tolerated treatment well    Behavior During Therapy  [facility] for tasks assessed/performed       Past Medical History:  Diagnosis Date  . Depression   . Headache   . Hyperlipidemia   . MS (multiple sclerosis) (HCC) 1990  . Neurogenic bladder   . Osteoporosis     Past Surgical History:  Procedure Laterality Date  . ABDOMINAL HYSTERECTOMY    . OOPHORECTOMY Bilateral     There were no vitals filed for this visit.  Subjective Assessment - 12/19/17 1027    Pertinent History  Patient reports she was diagnosed with MS years ago and has recently started to feel weak in her left dominant arm and hand.  She denies any previous therapy and reports memory issues and can provide a limited history.     Patient Stated Goals  Patient reports she wants to be able to walk better, use her left hand and be able to write again.     Pain Score  4     Pain Location  Leg    Pain Orientation  Left      OT TREATMENT    Neuro muscular re-education:  Pt. worked on [facility] skills grasping, and manipulating mini resistive clips. Pt. worked on picking them up from the tabletop surface, and repositioning them in her hand, and placing them on a small dowel.Pt. Worked on prewriting exercises. Pt. worked on Medical sales representative. Pt. is able to write her name legibly, however writing  becomes more less legible as writing length progresses. Pt. presented with difficulty with spacing, letter formation, and connecting circles.   Therapeutic Exercise:  Pt. worked on pinch strengthening in the left hand for lateral, and 3pt. pinch using yellow, red, and green resistive clips. Pt. worked on placing the clips at various vertical and horizontal angles. Tactile and verbal cues were required for eliciting the desired movement.                          OT Education - 12/19/17 1028    Education provided  Yes    Education Details  UE strength, and coordination    Methods  Explanation;Demonstration;Verbal cues    Comprehension  Verbal cues required;Returned demonstration;Verbalized understanding          OT Fogal Term Goals - 12/12/17 1930      OT Meas TERM GOAL #1   Title  Patient will demonstrate holding utensils in her left hand and feeding herself with modified independence with minimal spillage.      Baseline  unable to use left domin hand for self feeding at eval    Time  8    Period  Weeks    Status  On-going      OT Chestnut TERM GOAL #2   Title  Patient wil complete tub/shower transfer with supervision only.    Baseline  min assist at eval     Time  8    Period  Weeks    Status  On-going      OT Schadt TERM GOAL #3   Title  Patient will complete bathing UB and LB with supervision only.     Baseline  min assist at eval; modified independent on 12-10-17    Time  12    Period  Weeks    Status  Achieved      OT Bohac TERM GOAL #4   Title  Patient will increase left UE strength by 1 mm grade to be able to reach into closet to retrieve clothing items.     Baseline  requires assist from aide, 3-/5 strength at eval    Time  12    Period  Weeks    Status  On-going      OT Woodrow TERM GOAL #5   Title  Patient will improve coordination on the left to be able to hold pen securely and write her name with 75% legibility.     Baseline  unable at eval;  6-19 able to hold pen and form letters but decreased legibility    Time  12    Period  Weeks    Status  On-going      OT Shelton TERM GOAL #6   Title  Patient will complete lower body dressing with modified independence using adaptive equipment if needed.     Baseline  min assist at eval;  able to get pants on, difficulty with tying shoes 6/19    Time  12    Period  Weeks    Status  On-going            Plan - 12/19/17 1029    Clinical Impression Statement Pt. has a broken fingernail on her right 3rd finger. Pt. continues to work on improving LUE strength, and fine motor coordination skills. Pt. continues to work on improving ADL,  IADL functioning, and writing.   Occupational Profile and client history currently impacting functional performance  patient is legally blind, progressive disease process with MS for over 20+ years, lives alone, limited availability for transportation.      Occupational performance deficits (Please refer to evaluation for details):  ADL's;Leisure    Rehab Potential  Good    OT Frequency  2x / week    OT Duration  12 weeks    OT Treatment/Interventions  Therapeutic exercise;Cognitive remediation/compensation;Functional Mobility Training    Plan  Positive:  motivation, family support.  Negative:  lives alone, legally blind, requires caregiver 7 days a week for a few hours a day for self care    Clinical Decision Making  Several treatment options, min-mod task modification necessary    Consulted and Agree with Plan of Care  Patient       Patient will benefit from skilled therapeutic intervention in order to improve the following deficits and impairments:  Decreased cognition, Decreased knowledge of use of DME, Impaired vision/preception, Pain, Decreased coordination, Decreased activity tolerance, Decreased endurance, Decreased strength, Decreased balance, Impaired UE functional use  Visit Diagnosis: Muscle weakness (generalized)  Other lack of  coordination    Problem List Patient Active Problem List   Diagnosis Date Noted  . Lower extremity edema 04/15/2017  . Macrocytic anemia 02/11/2017  . Abscess,  gluteal, left 05/07/2016  . Vitamin D deficiency 02/02/2016  . Mixed hyperlipidemia 01/03/2015  . MS (multiple sclerosis) (HCC) 01/03/2015  . Depression 01/03/2015  . Chronic pain 01/03/2015  . GERD without esophagitis 01/03/2015  . COPD (chronic obstructive pulmonary disease) (HCC) 01/03/2015  . Osteopenia 01/03/2015  . Neurogenic bladder 01/03/2015  . Incontinence in female 01/03/2015    Olegario Messier, MS, OTR/L 12/19/2017, 10:46 AM  [place] [facility] MAIN [facility] SERVICES [address] [place], [place], [address] Phone: [phone]   Fax:  [phone]  Name: ELEINA JERGENS MRN: [id number] Date of Birth: [birth year]

## 2017-12-22 ENCOUNTER — Ambulatory Visit: Payer: Medicare Other | Attending: Neurology | Admitting: Occupational Therapy

## 2017-12-22 ENCOUNTER — Encounter: Payer: Self-pay | Admitting: Occupational Therapy

## 2017-12-22 DIAGNOSIS — R262 Difficulty in walking, not elsewhere classified: Secondary | ICD-10-CM | POA: Diagnosis not present

## 2017-12-22 DIAGNOSIS — R278 Other lack of coordination: Secondary | ICD-10-CM | POA: Diagnosis not present

## 2017-12-22 DIAGNOSIS — M6281 Muscle weakness (generalized): Secondary | ICD-10-CM | POA: Diagnosis not present

## 2017-12-22 NOTE — Therapy (Signed)
[place] [facility] MAIN [facility] SERVICES [address] [place], [place], [address] Phone: [phone]   Fax:  [phone]  Occupational Therapy Treatment  Patient Details  Name: [name] MRN: [id number] Date of Birth: [birth year] No data recorded  Encounter Date: 12/22/2017  OT End of Session - 12/22/17 1311    Visit Number  13    Number of Visits  24    Date for OT Re-Evaluation  01/23/18    Authorization Type  visit 3/10 for progress report period starting 12/10/2017    OT Start Time  1300    OT Stop Time  1345    OT Time Calculation (min)  45 min    Activity Tolerance  Patient tolerated treatment well    Behavior During Therapy  [facility] for tasks assessed/performed       Past Medical History:  Diagnosis Date  . Depression   . Headache   . Hyperlipidemia   . MS (multiple sclerosis) (HCC) 1990  . Neurogenic bladder   . Osteoporosis     Past Surgical History:  Procedure Laterality Date  . ABDOMINAL HYSTERECTOMY    . OOPHORECTOMY Bilateral     There were no vitals filed for this visit.  OT TREATMENT    Neuro muscular re-education:  Pt. worked on grasping, flipping and stacking 2" large pegs on the Instructo board placed at a tabletop surface. Pt. worked on grasping one inch resistive cubes alternating thumb opposition to the tip of the 2nd digit. The board was positioned at a vertical angle. Pt. worked on pressing them back into place while isolating 2nd through 5th digits. Pt. performed [facility] skills training to improve speed and dexterity needed for ADL tasks and writing. Pt. demonstrated grasping 1 inch sticks and placing them vertically in the Purdue pegboard. Pt. educattion was provided about forearm proximal stabilization.                        OT Education - 12/22/17 1311    Education provided  Yes    Education Details  UE strength, and coordination    Methods  Explanation;Demonstration;Verbal cues     Comprehension  Verbal cues required;Returned demonstration;Verbalized understanding          OT Spackman Term Goals - 12/12/17 1930      OT Peach TERM GOAL #1   Title  Patient will demonstrate holding utensils in her left hand and feeding herself with modified independence with minimal spillage.      Baseline  unable to use left domin hand for self feeding at eval    Time  8    Period  Weeks    Status  On-going      OT Haub TERM GOAL #2   Title  Patient wil complete tub/shower transfer with supervision only.    Baseline  min assist at eval     Time  8    Period  Weeks    Status  On-going      OT Zettel TERM GOAL #3   Title  Patient will complete bathing UB and LB with supervision only.     Baseline  min assist at eval; modified independent on 12-10-17    Time  12    Period  Weeks    Status  Achieved      OT Burston TERM GOAL #4   Title  Patient will increase left UE strength by 1 mm grade to be able  to reach into closet to retrieve clothing items.     Baseline  requires assist from aide, 3-/5 strength at eval    Time  12    Period  Weeks    Status  On-going      OT Difatta TERM GOAL #5   Title  Patient will improve coordination on the left to be able to hold pen securely and write her name with 75% legibility.     Baseline  unable at eval; 6-19 able to hold pen and form letters but decreased legibility    Time  12    Period  Weeks    Status  On-going      OT Heiman TERM GOAL #6   Title  Patient will complete lower body dressing with modified independence using adaptive equipment if needed.     Baseline  min assist at eval;  able to get pants on, difficulty with tying shoes 6/19    Time  12    Period  Weeks    Status  On-going            Plan - 12/22/17 1313    Clinical Impression Statement Pt. reports that she has a family reunion over the 4th of July. Pt. reports that her caregiver is going to the beach for a few days. Pt. reports that she will have a new caregiver come  while her current one is away. Pt. continues to work on improving UE strength, and [facility] skills.     Occupational Profile and client history currently impacting functional performance  patient is legally blind, progressive disease process with MS for over 20+ years, lives alone, limited availability for transportation.      Occupational performance deficits (Please refer to evaluation for details):  ADL's;Leisure    Rehab Potential  Good    OT Frequency  2x / week    OT Duration  12 weeks    OT Treatment/Interventions  Therapeutic exercise;Cognitive remediation/compensation;Functional Mobility Training    Plan  Positive:  motivation, family support.  Negative:  lives alone, legally blind, requires caregiver 7 days a week for a few hours a day for self care    Clinical Decision Making  Several treatment options, min-mod task modification necessary    Consulted and Agree with Plan of Care  Patient       Patient will benefit from skilled therapeutic intervention in order to improve the following deficits and impairments:  Decreased cognition, Decreased knowledge of use of DME, Impaired vision/preception, Pain, Decreased coordination, Decreased activity tolerance, Decreased endurance, Decreased strength, Decreased balance, Impaired UE functional use  Visit Diagnosis: Muscle weakness (generalized)  Other lack of coordination    Problem List Patient Active Problem List   Diagnosis Date Noted  . Lower extremity edema 04/15/2017  . Macrocytic anemia 02/11/2017  . Abscess, gluteal, left 05/07/2016  . Vitamin D deficiency 02/02/2016  . Mixed hyperlipidemia 01/03/2015  . MS (multiple sclerosis) (HCC) 01/03/2015  . Depression 01/03/2015  . Chronic pain 01/03/2015  . GERD without esophagitis 01/03/2015  . COPD (chronic obstructive pulmonary disease) (HCC) 01/03/2015  . Osteopenia 01/03/2015  . Neurogenic bladder 01/03/2015  . Incontinence in female 01/03/2015    Olegario Messier, MS,  OTR/L 12/22/2017, 1:27 PM  [place] [facility] MAIN [facility] SERVICES [address] [place], [place], [address] Phone: [phone]   Fax:  [phone]  Name: WENONA MAGNONE MRN: [id number] Date of Birth: [birth year]

## 2017-12-24 ENCOUNTER — Encounter: Payer: Self-pay | Admitting: Occupational Therapy

## 2017-12-24 ENCOUNTER — Ambulatory Visit: Payer: Medicare Other | Admitting: Occupational Therapy

## 2017-12-24 DIAGNOSIS — R278 Other lack of coordination: Secondary | ICD-10-CM | POA: Diagnosis not present

## 2017-12-24 DIAGNOSIS — R262 Difficulty in walking, not elsewhere classified: Secondary | ICD-10-CM | POA: Diagnosis not present

## 2017-12-24 DIAGNOSIS — M6281 Muscle weakness (generalized): Secondary | ICD-10-CM | POA: Diagnosis not present

## 2017-12-24 NOTE — Therapy (Signed)
[facility] MAIN [facility] SERVICES [address] [place], [place], [address] Phone: [phone]   Fax:  [phone]  Occupational Therapy Treatment  Patient Details  Name: [name] MRN: [id number] Date of Birth: 25-Feb-1956 No data recorded  Encounter Date: 12/24/2017  OT End of Session - 12/24/17 1310    Visit Number  14    Number of Visits  24    Date for OT Re-Evaluation  01/23/18    Authorization Type  visit 4/10 for progress report period starting 12/10/2017    OT Start Time  1300    OT Stop Time  1345    OT Time Calculation (min)  45 min    Equipment Utilized During Treatment  adaptive pens    Activity Tolerance  Patient tolerated treatment well    Behavior During Therapy  WFL for tasks assessed/performed       Past Medical History:  Diagnosis Date  . Depression   . Headache   . Hyperlipidemia   . MS (multiple sclerosis) (HCC) 1990  . Neurogenic bladder   . Osteoporosis     Past Surgical History:  Procedure Laterality Date  . ABDOMINAL HYSTERECTOMY    . OOPHORECTOMY Bilateral     There were no vitals filed for this visit.  Subjective Assessment - 12/24/17 1307    Subjective   Pt. reports her caregiver went to the beach for a few days.    Pertinent History  Patient reports she was diagnosed with MS years ago and has recently started to feel weak in her left dominant arm and hand.  She denies any previous therapy and reports memory issues and can provide a limited history.     Patient Stated Goals  Patient reports she wants to be able to walk better, use her left hand and be able to write again.     Currently in Pain?  Yes    Pain Score  4     Pain Location  Leg    Pain Orientation  Left    Pain Descriptors / Indicators  Aching      OT TREATMENT    Neuro muscular re-education:  Pt. worked on tasks to sustain lateral pinch on resistive tweezers while grasping and moving 2" toothpick sticks from a horizontal flat  position to a vertical position in order to place it in the holder. Pt. was able to sustain grasp while positioning and extending the wrist/hand in the necessary alignment needed to place the stick through the top of the holder. Pt. education was provided about proximal forearm stability. Pt. worked on bilateral hand coordination skills tying, and untying a towel. Pt. reports grasping, and manipulating untying knots on various sizes of knots.                        OT Education - 12/24/17 1310    Education provided  Yes    Education Details  UE strength, and coordination    Person(s) Educated  Patient    Methods  Explanation;Demonstration;Verbal cues    Comprehension  Verbal cues required;Returned demonstration;Verbalized understanding          OT Beckerman Term Goals - 12/12/17 1930      OT Melecio TERM GOAL #1   Title  Patient will demonstrate holding utensils in her left hand and feeding herself with modified independence with minimal spillage.      Baseline  unable to use left domin hand for  self feeding at eval    Time  8    Period  Weeks    Status  On-going      OT Quintanar TERM GOAL #2   Title  Patient wil complete tub/shower transfer with supervision only.    Baseline  min assist at eval     Time  8    Period  Weeks    Status  On-going      OT Cassady TERM GOAL #3   Title  Patient will complete bathing UB and LB with supervision only.     Baseline  min assist at eval; modified independent on 12-10-17    Time  12    Period  Weeks    Status  Achieved      OT Sansoucie TERM GOAL #4   Title  Patient will increase left UE strength by 1 mm grade to be able to reach into closet to retrieve clothing items.     Baseline  requires assist from aide, 3-/5 strength at eval    Time  12    Period  Weeks    Status  On-going      OT Sienkiewicz TERM GOAL #5   Title  Patient will improve coordination on the left to be able to hold pen securely and write her name with 75% legibility.      Baseline  unable at eval; 6-19 able to hold pen and form letters but decreased legibility    Time  12    Period  Weeks    Status  On-going      OT Thaxton TERM GOAL #6   Title  Patient will complete lower body dressing with modified independence using adaptive equipment if needed.     Baseline  min assist at eval;  able to get pants on, difficulty with tying shoes 6/19    Time  12    Period  Weeks    Status  On-going            Plan - 12/24/17 1312    Clinical Impression Statement  Pt. reports that she has a family reunion for next several days including today over July 4th. Pt. continues to work on improving LUE functioning for improved ADLs, and IADLs    Occupational Profile and client history currently impacting functional performance  patient is legally blind, progressive disease process with MS for over 20+ years, lives alone, limited availability for transportation.      Occupational performance deficits (Please refer to evaluation for details):  ADL's;Leisure    Rehab Potential  Good    OT Frequency  2x / week    OT Treatment/Interventions  Therapeutic exercise;Cognitive remediation/compensation;Functional Mobility Training    Plan  Positive:  motivation, family support.  Negative:  lives alone, legally blind, requires caregiver 7 days a week for a few hours a day for self care    Clinical Decision Making  Several treatment options, min-mod task modification necessary    Consulted and Agree with Plan of Care  Patient       Patient will benefit from skilled therapeutic intervention in order to improve the following deficits and impairments:  Decreased cognition, Decreased knowledge of use of DME, Impaired vision/preception, Pain, Decreased coordination, Decreased activity tolerance, Decreased endurance, Decreased strength, Decreased balance, Impaired UE functional use  Visit Diagnosis: Muscle weakness (generalized)  Other lack of coordination    Problem List Patient  Active Problem List   Diagnosis Date Noted  . Lower extremity  edema 04/15/2017  . Macrocytic anemia 02/11/2017  . Abscess, gluteal, left 05/07/2016  . Vitamin D deficiency 02/02/2016  . Mixed hyperlipidemia 01/03/2015  . MS (multiple sclerosis) (HCC) 01/03/2015  . Depression 01/03/2015  . Chronic pain 01/03/2015  . GERD without esophagitis 01/03/2015  . COPD (chronic obstructive pulmonary disease) (HCC) 01/03/2015  . Osteopenia 01/03/2015  . Neurogenic bladder 01/03/2015  . Incontinence in female 01/03/2015    Olegario Messier, MS, OTR/L 12/24/2017, 1:28 PM  [facility] MAIN [facility] SERVICES [address] [place], [place], [address] Phone: [phone]   Fax:  [phone]  Name: [name] MRN: [id number] Date of Birth: 1955-11-11

## 2017-12-29 ENCOUNTER — Ambulatory Visit: Payer: Medicare Other | Admitting: Occupational Therapy

## 2017-12-29 ENCOUNTER — Encounter: Payer: Self-pay | Admitting: Occupational Therapy

## 2017-12-29 DIAGNOSIS — R262 Difficulty in walking, not elsewhere classified: Secondary | ICD-10-CM | POA: Diagnosis not present

## 2017-12-29 DIAGNOSIS — R278 Other lack of coordination: Secondary | ICD-10-CM

## 2017-12-29 DIAGNOSIS — M6281 Muscle weakness (generalized): Secondary | ICD-10-CM

## 2017-12-29 NOTE — Therapy (Signed)
[facility] MAIN [facility] SERVICES [address] [place], [place], [address] Phone: [phone]   Fax:  [phone]  Occupational Therapy Treatment  Patient Details  Name: [name] MRN: [id number] Date of Birth: 1955/11/07 No data recorded  Encounter Date: 12/29/2017  OT End of Session - 12/29/17 1102    Visit Number  15    Number of Visits  24    Date for OT Re-Evaluation  01/23/18    Authorization Type  visit 5/10 for progress report period starting 12/10/2017    OT Start Time  1052    OT Stop Time  1135    OT Time Calculation (min)  43 min    Activity Tolerance  Patient tolerated treatment well    Behavior During Therapy  [facility] for tasks assessed/performed       Past Medical History:  Diagnosis Date  . Depression   . Headache   . Hyperlipidemia   . MS (multiple sclerosis) (HCC) 1990  . Neurogenic bladder   . Osteoporosis     Past Surgical History:  Procedure Laterality Date  . ABDOMINAL HYSTERECTOMY    . OOPHORECTOMY Bilateral     There were no vitals filed for this visit.  Subjective Assessment - 12/29/17 1101    Subjective   Pt. reports that her caregiver has returned form her beach trip.    Pertinent History  Patient reports she was diagnosed with MS years ago and has recently started to feel weak in her left dominant arm and hand.  She denies any previous therapy and reports memory issues and can provide a limited history.     Patient Stated Goals  Patient reports she wants to be able to walk better, use her left hand and be able to write again.     Currently in Pain?  Yes    Pain Score  4     Pain Location  Leg    Pain Orientation  Left    Pain Descriptors / Indicators  Aching    Pain Type  Chronic pain      OT TREATMENT    Neuro muscular re-education:  Pt. worked on grasping, flipping and stacking 2" large pegs on the Instructo board placed at a tabletop surface. Pt. Used the left UE, and hand. Pt. required  verbal cues, and visual demonstration. Pt. Required assist for motor planning. Pt. worked on grasping 1" resistive cubes alternating thumb opposition to the tip of the 2nd through 5th digits while the board is placed at the tabletop surface. Pt. worked on pressing the cubes back into place while alternating isolated 2nd through 5th digit extension. Pt. Worked on writing legibility, and formulating letters.                          OT Education - 12/29/17 1102    Education provided  Yes    Education Details  UE strength, and coordination    Person(s) Educated  Patient    Methods  Explanation;Demonstration;Verbal cues    Comprehension  Verbal cues required;Returned demonstration;Verbalized understanding          OT Kracht Term Goals - 12/12/17 1930      OT Nilsson TERM GOAL #1   Title  Patient will demonstrate holding utensils in her left hand and feeding herself with modified independence with minimal spillage.      Baseline  unable to use left domin hand for self feeding at  eval    Time  8    Period  Weeks    Status  On-going      OT Maddy TERM GOAL #2   Title  Patient wil complete tub/shower transfer with supervision only.    Baseline  min assist at eval     Time  8    Period  Weeks    Status  On-going      OT Sandberg TERM GOAL #3   Title  Patient will complete bathing UB and LB with supervision only.     Baseline  min assist at eval; modified independent on 12-10-17    Time  12    Period  Weeks    Status  Achieved      OT Jaspers TERM GOAL #4   Title  Patient will increase left UE strength by 1 mm grade to be able to reach into closet to retrieve clothing items.     Baseline  requires assist from aide, 3-/5 strength at eval    Time  12    Period  Weeks    Status  On-going      OT Oyola TERM GOAL #5   Title  Patient will improve coordination on the left to be able to hold pen securely and write her name with 75% legibility.     Baseline  unable at eval; 6-19  able to hold pen and form letters but decreased legibility    Time  12    Period  Weeks    Status  On-going      OT Travaglini TERM GOAL #6   Title  Patient will complete lower body dressing with modified independence using adaptive equipment if needed.     Baseline  min assist at eval;  able to get pants on, difficulty with tying shoes 6/19    Time  12    Period  Weeks    Status  On-going            Plan - 12/29/17 1103    Clinical Impression Statement  Pt. had a family reunion during over the weekend. Pt. reports having had difficulty with cutting her salad, and food this weekend.  Pt. conitnues to work on improving writing tasks. Pt. continues to work on improving LUE strength, and [facility] skills. Pt. presented with improved steadiness today. Pt. required increased cues for memory.    Occupational Profile and client history currently impacting functional performance  patient is legally blind, progressive disease process with MS for over 20+ years, lives alone, limited availability for transportation.      Occupational performance deficits (Please refer to evaluation for details):  ADL's;Leisure    Rehab Potential  Good    OT Frequency  2x / week    OT Duration  12 weeks    OT Treatment/Interventions  Therapeutic exercise;Cognitive remediation/compensation;Functional Mobility Training    Plan  Positive:  motivation, family support.  Negative:  lives alone, legally blind, requires caregiver 7 days a week for a few hours a day for self care    Clinical Decision Making  Several treatment options, min-mod task modification necessary    Consulted and Agree with Plan of Care  Patient       Patient will benefit from skilled therapeutic intervention in order to improve the following deficits and impairments:  Decreased cognition, Decreased knowledge of use of DME, Impaired vision/preception, Pain, Decreased coordination, Decreased activity tolerance, Decreased endurance, Decreased strength,  Decreased balance, Impaired UE functional use  Visit Diagnosis: Muscle weakness (generalized)  Other lack of coordination    Problem List Patient Active Problem List   Diagnosis Date Noted  . Lower extremity edema 04/15/2017  . Macrocytic anemia 02/11/2017  . Abscess, gluteal, left 05/07/2016  . Vitamin D deficiency 02/02/2016  . Mixed hyperlipidemia 01/03/2015  . MS (multiple sclerosis) (HCC) 01/03/2015  . Depression 01/03/2015  . Chronic pain 01/03/2015  . GERD without esophagitis 01/03/2015  . COPD (chronic obstructive pulmonary disease) (HCC) 01/03/2015  . Osteopenia 01/03/2015  . Neurogenic bladder 01/03/2015  . Incontinence in female 01/03/2015    Olegario Messier, MS, OTR/L 12/29/2017, 11:30 AM  [facility] MAIN [facility] SERVICES [address] [place], [place], [address] Phone: [phone]   Fax:  [phone]  Name: [name] MRN: [id number] Date of Birth: [birth year]

## 2017-12-31 ENCOUNTER — Ambulatory Visit: Payer: Medicare Other | Admitting: Occupational Therapy

## 2017-12-31 ENCOUNTER — Encounter: Payer: Self-pay | Admitting: Occupational Therapy

## 2017-12-31 DIAGNOSIS — M6281 Muscle weakness (generalized): Secondary | ICD-10-CM | POA: Diagnosis not present

## 2017-12-31 DIAGNOSIS — R278 Other lack of coordination: Secondary | ICD-10-CM | POA: Diagnosis not present

## 2017-12-31 DIAGNOSIS — R262 Difficulty in walking, not elsewhere classified: Secondary | ICD-10-CM | POA: Diagnosis not present

## 2017-12-31 NOTE — Therapy (Signed)
[facility] MAIN [facility] SERVICES [address] [place], [place], [address] Phone: [phone]   Fax:  [phone]  Occupational Therapy Treatment  Patient Details  Name: [name] MRN: [id number] Date of Birth: 1955-12-17 No data recorded  Encounter Date: 12/31/2017  OT End of Session - 12/31/17 1155    Visit Number  16    Number of Visits  24    Date for OT Re-Evaluation  01/23/18    Authorization Type  visit 6/10 for progress report period starting 12/10/2017    Activity Tolerance  Patient tolerated treatment well    Behavior During Therapy  [facility] for tasks assessed/performed       Past Medical History:  Diagnosis Date  . Depression   . Headache   . Hyperlipidemia   . MS (multiple sclerosis) (HCC) 1990  . Neurogenic bladder   . Osteoporosis     Past Surgical History:  Procedure Laterality Date  . ABDOMINAL HYSTERECTOMY    . OOPHORECTOMY Bilateral     There were no vitals filed for this visit.  Subjective Assessment - 12/31/17 1154    Subjective   Pt. reports that she forgot her jacket.    Pertinent History  Patient reports she was diagnosed with MS years ago and has recently started to feel weak in her left dominant arm and hand.  She denies any previous therapy and reports memory issues and can provide a limited history.     Patient Stated Goals  Patient reports she wants to be able to walk better, use her left hand and be able to write again.     Currently in Pain?  Yes      OT TREATMENT    Neuro muscular re-education:  Pt. worked on grasping, flipping and stacking 2" large pegs on the Instructo board placed at a tabletop surface. Pt. worked on grasping, and manipulating 1/2" marbles, and storing them in the palm of her hand. Pt. worked on translatory movement of the hand. Pt. worked on Diplomatic Services operational officer tasks, formulating letters spelling out names. Pt. continues to work on Teaching laboratory technician.                        OT Education - 12/31/17 1155    Education provided  Yes    Education Details  UE strength, and coordination    Person(s) Educated  Patient    Methods  Explanation;Demonstration;Verbal cues    Comprehension  Verbal cues required;Returned demonstration;Verbalized understanding          OT Cerniglia Term Goals - 12/12/17 1930      OT Kolk TERM GOAL #1   Title  Patient will demonstrate holding utensils in her left hand and feeding herself with modified independence with minimal spillage.      Baseline  unable to use left domin hand for self feeding at eval    Time  8    Period  Weeks    Status  On-going      OT Danley TERM GOAL #2   Title  Patient wil complete tub/shower transfer with supervision only.    Baseline  min assist at eval     Time  8    Period  Weeks    Status  On-going      OT Kovacic TERM GOAL #3   Title  Patient will complete bathing UB and LB with supervision only.     Baseline  min assist  at eval; modified independent on 12-10-17    Time  12    Period  Weeks    Status  Achieved      OT Kenealy TERM GOAL #4   Title  Patient will increase left UE strength by 1 mm grade to be able to reach into closet to retrieve clothing items.     Baseline  requires assist from aide, 3-/5 strength at eval    Time  12    Period  Weeks    Status  On-going      OT Nourse TERM GOAL #5   Title  Patient will improve coordination on the left to be able to hold pen securely and write her name with 75% legibility.     Baseline  unable at eval; 6-19 able to hold pen and form letters but decreased legibility    Time  12    Period  Weeks    Status  On-going      OT Osburn TERM GOAL #6   Title  Patient will complete lower body dressing with modified independence using adaptive equipment if needed.     Baseline  min assist at eval;  able to get pants on, difficulty with tying shoes 6/19    Time  12    Period  Weeks    Status  On-going             Plan - 12/31/17 1156    Clinical Impression Statement  Pt. arrived several hours early for her appointment this morning because her caregiver confused the therapy time. Pt. continues to work on improving UE strength, and [facility] skills for improved functional use during ADL, and IADL functioning.    Occupational Profile and client history currently impacting functional performance  patient is legally blind, progressive disease process with MS for over 20+ years, lives alone, limited availability for transportation.      Occupational performance deficits (Please refer to evaluation for details):  ADL's;Leisure    Rehab Potential  Good    OT Frequency  2x / week    OT Duration  12 weeks    OT Treatment/Interventions  Therapeutic exercise;Cognitive remediation/compensation;Functional Mobility Training    Plan  Positive:  motivation, family support.  Negative:  lives alone, legally blind, requires caregiver 7 days a week for a few hours a day for self care    Clinical Decision Making  Several treatment options, min-mod task modification necessary    Consulted and Agree with Plan of Care  Patient       Patient will benefit from skilled therapeutic intervention in order to improve the following deficits and impairments:  Decreased cognition, Decreased knowledge of use of DME, Impaired vision/preception, Pain, Decreased coordination, Decreased activity tolerance, Decreased endurance, Decreased strength, Decreased balance, Impaired UE functional use  Visit Diagnosis: Muscle weakness (generalized)  Other lack of coordination    Problem List Patient Active Problem List   Diagnosis Date Noted  . Lower extremity edema 04/15/2017  . Macrocytic anemia 02/11/2017  . Abscess, gluteal, left 05/07/2016  . Vitamin D deficiency 02/02/2016  . Mixed hyperlipidemia 01/03/2015  . MS (multiple sclerosis) (HCC) 01/03/2015  . Depression 01/03/2015  . Chronic pain 01/03/2015  . GERD without  esophagitis 01/03/2015  . COPD (chronic obstructive pulmonary disease) (HCC) 01/03/2015  . Osteopenia 01/03/2015  . Neurogenic bladder 01/03/2015  . Incontinence in female 01/03/2015    Olegario Messier, MS, OTR/L 12/31/2017, 12:14 PM  [facility] MAIN REHAB  SERVICES [address] [place], [place], [address] Phone: [phone]   Fax:  [phone]  Name: [name] MRN: [id number] Date of Birth: March 08, 1956

## 2018-01-05 ENCOUNTER — Ambulatory Visit: Payer: Medicare Other | Admitting: Occupational Therapy

## 2018-01-05 DIAGNOSIS — H539 Unspecified visual disturbance: Secondary | ICD-10-CM | POA: Diagnosis not present

## 2018-01-05 DIAGNOSIS — R350 Frequency of micturition: Secondary | ICD-10-CM | POA: Diagnosis not present

## 2018-01-05 DIAGNOSIS — G35 Multiple sclerosis: Secondary | ICD-10-CM | POA: Diagnosis not present

## 2018-01-05 DIAGNOSIS — M545 Low back pain: Secondary | ICD-10-CM | POA: Diagnosis not present

## 2018-01-07 ENCOUNTER — Encounter: Payer: Self-pay | Admitting: Physical Therapy

## 2018-01-07 ENCOUNTER — Encounter: Payer: Self-pay | Admitting: Occupational Therapy

## 2018-01-07 ENCOUNTER — Ambulatory Visit: Payer: Medicare Other | Admitting: Physical Therapy

## 2018-01-07 ENCOUNTER — Other Ambulatory Visit: Payer: Self-pay

## 2018-01-07 ENCOUNTER — Ambulatory Visit: Payer: Medicare Other | Admitting: Occupational Therapy

## 2018-01-07 DIAGNOSIS — M6281 Muscle weakness (generalized): Secondary | ICD-10-CM

## 2018-01-07 DIAGNOSIS — R278 Other lack of coordination: Secondary | ICD-10-CM | POA: Diagnosis not present

## 2018-01-07 DIAGNOSIS — R262 Difficulty in walking, not elsewhere classified: Secondary | ICD-10-CM | POA: Diagnosis not present

## 2018-01-07 NOTE — Therapy (Signed)
[facility] MAIN [facility] SERVICES [address] [place], [place], [address] Phone: [phone]   Fax:  [phone]  Physical Therapy Evaluation  Patient Details  Name: [name] MRN: [id number] Date of Birth: 1956-04-06 Referring Provider: Morene Crocker   Encounter Date: 01/07/2018  PT End of Session - 01/07/18 1209    Visit Number  1    Number of Visits  25    Date for PT Re-Evaluation  04/01/18    PT Start Time  1145    PT Stop Time  1230    PT Time Calculation (min)  45 min    Equipment Utilized During Treatment  Gait belt    Activity Tolerance  Patient tolerated treatment well    Behavior During Therapy  [facility] for tasks assessed/performed       Past Medical History:  Diagnosis Date  . Depression   . Headache   . Hyperlipidemia   . MS (multiple sclerosis) (HCC) 1990  . Neurogenic bladder   . Osteoporosis     Past Surgical History:  Procedure Laterality Date  . ABDOMINAL HYSTERECTOMY    . OOPHORECTOMY Bilateral     There were no vitals filed for this visit.   Subjective Assessment - 01/07/18 1156    Subjective  Patient reports 6 months ago she started to feel weak in the left arm, reports she is legally blind.   She reports that she has a weak left leg.     Pertinent History  Patient reports about 3 months ago she started to feel weak in the left arm, reports she is legally blind.       How Shoun can you stand comfortably?  not too Leppert     Patient Stated Goals  Patient wants to be able to walk better.     Currently in Pain?  Yes    Pain Score  4     Pain Location  Leg    Pain Orientation  Left    Pain Descriptors / Indicators  Aching    Pain Type  Chronic pain    Pain Onset  More than a month ago    Aggravating Factors   walking    Pain Relieving Factors  nothing    Effect of Pain on Daily Activities  difficult to do standing activities         [facility] PT Assessment - 01/07/18 1204      Assessment   Medical  Diagnosis  cva    Referring Provider  Theora Master E    Onset Date/Surgical Date  01/07/18    Prior Therapy  none      Precautions   Precautions  Fall      Restrictions   Weight Bearing Restrictions  No      Balance Screen   Has the patient fallen in the past 6 months  Yes    How many times?  1    Has the patient had a decrease in activity level because of a fear of falling?   Yes    Is the patient reluctant to leave their home because of a fear of falling?   No      Home Environment   Living Environment  Private residence    Type of Home  Apartment    Home Access  Level entry    Home Layout  One level    Home Equipment  Walker - 4 wheels  Prior Function   Vocation  On disability    Leisure  fish      Cognition   Memory  Impaired    Memory Impairment  Decreased short term memory         POSTURE: WFL    PROM/AROM:WFL with BUE and BLE    STRENGTH:  Graded on a 0-5 scale Muscle Group Left Right                          Hip Flex 4/5 4/5  Hip Abd 4/5               Knee Flex 4/5 4/5  Knee Ext 4/5 4/5  Ankle DF 4/5 4/5  Ankle PF 4/5 4/5   SENSATION: reports numbness in R hand intermittent  FUNCTIONAL MOBILITY: transfers sit to stand independently with definite use of UE's  BALANCE:unable to tandem stand and unable to single leg stand  Standing Dynamic Balance  Normal Stand independently unsupported, able to weight shift and cross midline maximally   Good Stand independently unsupported, able to weight shift and cross midline moderately   Good-/Fair+ Stand independently unsupported, able to weight shift across midline minimally   Fair Stand independently unsupported, weight shift, and reach ipsilaterally, loss of balance when crossing midline x  Poor+ Able to stand with Min A and reach ipsilaterally, unable to weight shift   Poor Able to stand with Mod A and minimally reach ipsilaterally, unable to cross midline.    Static Standing Balance    Normal Able to maintain standing balance against maximal resistance   Good Able to maintain standing balance against moderate resistance   Good-/Fair+ Able to maintain standing balance against minimal resistance   Fair Able to stand unsupported without UE support and without LOB for 1-2 min x  Fair- Requires Min A and UE support to maintain standing without loss of balance   Poor+ Requires mod A and UE support to maintain standing without loss of balance   Poor Requires max A and UE support to maintain standing balance without loss     GAIT: Patient ambulates with rollator with slow gait speed and wide base of support 500 feet without rest period  OUTCOME MEASURES: TEST Outcome Interpretation  5 times sit<>stand 17.19sec >60 yo, >15 sec indicates increased risk for falls  10 meter walk test .46 sec                 m/s <1.0 m/s indicates increased risk for falls; limited community ambulator  Timed up and Go    31.39              sec <14 sec indicates increased risk for falls  6 minute walk test     450            Feet 1000 feet is community ambulator              HEP Bridge   Lie back, legs bent. Inhale, pressing hips up. Keeping ribs in, lengthen lower back. Exhale, rolling down along spine from top. Repeat ___20_ times. Do _2___ sessions per day.  Copyright  VHI. All rights reserved.   Pelvic Tilt .   Back Wall Slide   With feet _12___ inches from wall, lean as much of back against the wall as possible. Gently squat down _12__ inches, keeping back against wall. Hold ___5_ seconds while counting out loud. Repeat __10__ times. Do __1__ sessions  per day.  http://gt2.exer.us/563   Copyright  VHI. All rights reserved.                    PT Education - 01/07/18 1208    Education Details  plan of care    Person(s) Educated  Patient    Methods  Explanation    Comprehension  Verbalized understanding;Need further instruction       PT Short Term Goals -  01/07/18 1213      PT SHORT TERM GOAL #1   Title  Patient will be independent in home exercise program to improve strength/mobility for better functional independence with ADLs.    Time  6    Period  Weeks    Status  New    Target Date  02/18/18      PT SHORT TERM GOAL #2   Title  nt (< 75 years old) will complete five times sit to stand test in < 10 seconds indicating an increased LE strength and improved balance.    Time  6    Period  Weeks    Status  New    Target Date  02/18/18        PT Jupin Term Goals - 01/07/18 1214      PT Wachtel TERM GOAL #1   Title  Patient will increase six minute walk test distance to >1000 for progression to community ambulator and improve gait ability    Time  12    Period  Weeks    Status  New    Target Date  04/01/18      PT Keeny TERM GOAL #2   Title  Patient will increase 10 meter walk test to >1.46m/s as to improve gait speed for better community ambulation and to reduce fall risk.    Time  12    Period  Weeks    Status  New    Target Date  04/01/18      PT Florek TERM GOAL #3   Title  Patient will reduce timed up and go to <11 seconds to reduce fall risk and demonstrate improved transfer/gait ability.    Time  12    Period  Weeks    Status  New    Target Date  04/01/18             Plan - 01/07/18 1210    Clinical Impression Statement  Patient has MS and recent fall. She has decreased strentgth of BLE and decreased mobiltiy with ambulation using a rollator and decreased transfers sit to stand with rollator. She has decreased outcome meausres indicating a falls risk . She will benefit from skilled PT to improve mobility and decrease her falls risk.     Clinical Presentation  Evolving    Clinical Presentation due to:  fall    Clinical Decision Making  Moderate    Rehab Potential  Good    PT Frequency  2x / week    PT Duration  12 weeks    PT Treatment/Interventions  Manual techniques;Neuromuscular re-education;Balance  training;Therapeutic activities;Therapeutic exercise;Gait training;Moist Heat    PT Next Visit Plan  strengthening, balance training    Consulted and Agree with Plan of Care  Patient       Patient will benefit from skilled therapeutic intervention in order to improve the following deficits and impairments:  Abnormal gait, Decreased balance, Decreased endurance, Decreased mobility, Decreased activity tolerance, Decreased coordination, Decreased strength, Pain, Decreased knowledge of precautions  Visit  Diagnosis: Muscle weakness (generalized)  Other lack of coordination  Difficulty in walking, not elsewhere classified     Problem List Patient Active Problem List   Diagnosis Date Noted  . Lower extremity edema 04/15/2017  . Macrocytic anemia 02/11/2017  . Vitamin D deficiency 02/02/2016  . Mixed hyperlipidemia 01/03/2015  . MS (multiple sclerosis) (HCC) 01/03/2015  . Depression 01/03/2015  . Chronic pain 01/03/2015  . GERD without esophagitis 01/03/2015  . COPD (chronic obstructive pulmonary disease) (HCC) 01/03/2015  . Osteopenia 01/03/2015  . Neurogenic bladder 01/03/2015  . Incontinence in female 01/03/2015    Ezekiel Ina, PT DPT 01/07/2018, 12:17 PM  [place] [facility] MAIN [facility] SERVICES [address] [place], [place], [address] Phone: [phone]   Fax:  [phone]  Name: [name] MRN: [id number] Date of Birth: 1955-08-12

## 2018-01-07 NOTE — Therapy (Signed)
[facility] MAIN [facility] SERVICES [address] [place], [place], [address] Phone: [phone]   Fax:  [phone]  Occupational Therapy Treatment  Patient Details  Name: [name] MRN: [id number] Date of Birth: [birth year] No data recorded  Encounter Date: 01/07/2018  OT End of Session - 01/07/18 1111    Visit Number  17    Number of Visits  24    Date for OT Re-Evaluation  01/23/18    Authorization Type  visit 7/10 for progress report period starting 12/10/2017    OT Start Time  1100    OT Stop Time  1145    OT Time Calculation (min)  45 min    Equipment Utilized During Treatment  adaptive pens    Activity Tolerance  Patient tolerated treatment well    Behavior During Therapy  WFL for tasks assessed/performed       Past Medical History:  Diagnosis Date  . Depression   . Headache   . Hyperlipidemia   . MS (multiple sclerosis) (HCC) 1990  . Neurogenic bladder   . Osteoporosis     Past Surgical History:  Procedure Laterality Date  . ABDOMINAL HYSTERECTOMY    . OOPHORECTOMY Bilateral     There were no vitals filed for this visit.  Subjective Assessment - 01/07/18 1110    Subjective   Pt. reports that she got mixed up with her schedule, and missed the last seesion because of an MD appointment.    Pertinent History  Patient reports she was diagnosed with MS years ago and has recently started to feel weak in her left dominant arm and hand.  She denies any previous therapy and reports memory issues and can provide a limited history.     Patient Stated Goals  Patient reports she wants to be able to walk better, use her left hand and be able to write again.     Currently in Pain?  Yes      OT TREATMENT    Neuro muscular re-education:  Pt. worked on using her left hand for grasping, flipping x 2 reps, and stacking 2" large pegs on the Instructo board placed at a tabletop surface. Pt. worked on Copy, and coordination skills  while stacking the pegs.   Self-care:  Pt. Requires constant cues and assistance for navigating to, and from the bathroom. Pt. Is able to complete toileting independently once at the bathroom.                       OT Education - 01/07/18 1111    Education provided  Yes    Education Details  UE strength, and coordination    Person(s) Educated  Patient    Methods  Explanation;Demonstration;Verbal cues    Comprehension  Verbal cues required;Returned demonstration;Verbalized understanding          OT Dittus Term Goals - 12/12/17 1930      OT Grissom TERM GOAL #1   Title  Patient will demonstrate holding utensils in her left hand and feeding herself with modified independence with minimal spillage.      Baseline  unable to use left domin hand for self feeding at eval    Time  8    Period  Weeks    Status  On-going      OT Fierro TERM GOAL #2   Title  Patient wil complete tub/shower transfer with supervision only.    Baseline  min assist  at eval     Time  8    Period  Weeks    Status  On-going      OT Albino TERM GOAL #3   Title  Patient will complete bathing UB and LB with supervision only.     Baseline  min assist at eval; modified independent on 12-10-17    Time  12    Period  Weeks    Status  Achieved      OT Gongora TERM GOAL #4   Title  Patient will increase left UE strength by 1 mm grade to be able to reach into closet to retrieve clothing items.     Baseline  requires assist from aide, 3-/5 strength at eval    Time  12    Period  Weeks    Status  On-going      OT Blanford TERM GOAL #5   Title  Patient will improve coordination on the left to be able to hold pen securely and write her name with 75% legibility.     Baseline  unable at eval; 6-19 able to hold pen and form letters but decreased legibility    Time  12    Period  Weeks    Status  On-going      OT Pounds TERM GOAL #6   Title  Patient will complete lower body dressing with modified independence  using adaptive equipment if needed.     Baseline  min assist at eval;  able to get pants on, difficulty with tying shoes 6/19    Time  12    Period  Weeks    Status  On-going            Plan - 01/07/18 1112    Clinical Impression Statement  Pt. reports she feels like she is improving, and getting better overall. Pt. reports that her writing is improving, and becoming more legible. Pt. reports she is using her hand more to engage in ADL, and IADL functioning. Pt. continues to work on improving UE strength, and [facility] skills for improved engagement is ADL, and IADL tasks.     Occupational Profile and client history currently impacting functional performance  patient is legally blind, progressive disease process with MS for over 20+ years, lives alone, limited availability for transportation.      Occupational performance deficits (Please refer to evaluation for details):  ADL's;Leisure    Rehab Potential  Good    OT Frequency  2x / week    OT Duration  12 weeks    OT Treatment/Interventions  Therapeutic exercise;Cognitive remediation/compensation;Functional Mobility Training    Plan  Positive:  motivation, family support.  Negative:  lives alone, legally blind, requires caregiver 7 days a week for a few hours a day for self care    Clinical Decision Making  Several treatment options, min-mod task modification necessary    Consulted and Agree with Plan of Care  Patient       Patient will benefit from skilled therapeutic intervention in order to improve the following deficits and impairments:  Decreased cognition, Decreased knowledge of use of DME, Impaired vision/preception, Pain, Decreased coordination, Decreased activity tolerance, Decreased endurance, Decreased strength, Decreased balance, Impaired UE functional use  Visit Diagnosis: Muscle weakness (generalized)  Other lack of coordination    Problem List Patient Active Problem List   Diagnosis Date Noted  . Lower extremity edema  04/15/2017  . Macrocytic anemia 02/11/2017  . Vitamin D deficiency 02/02/2016  .  Mixed hyperlipidemia 01/03/2015  . MS (multiple sclerosis) (HCC) 01/03/2015  . Depression 01/03/2015  . Chronic pain 01/03/2015  . GERD without esophagitis 01/03/2015  . COPD (chronic obstructive pulmonary disease) (HCC) 01/03/2015  . Osteopenia 01/03/2015  . Neurogenic bladder 01/03/2015  . Incontinence in female 01/03/2015    Olegario Messier, MS, OTR/L 01/07/2018, 11:19 AM  [facility] [facility] MAIN [facility] SERVICES [address] [place], [place], [address] Phone: [phone]   Fax:  [phone]  Name: [name] MRN: [id number] Date of Birth: [birth year]

## 2018-01-13 ENCOUNTER — Encounter: Payer: Self-pay | Admitting: Occupational Therapy

## 2018-01-13 ENCOUNTER — Encounter: Payer: Self-pay | Admitting: Physical Therapy

## 2018-01-13 ENCOUNTER — Ambulatory Visit: Payer: Medicare Other | Admitting: Occupational Therapy

## 2018-01-13 ENCOUNTER — Ambulatory Visit: Payer: Medicare Other | Admitting: Physical Therapy

## 2018-01-13 DIAGNOSIS — M6281 Muscle weakness (generalized): Secondary | ICD-10-CM | POA: Diagnosis not present

## 2018-01-13 DIAGNOSIS — R278 Other lack of coordination: Secondary | ICD-10-CM

## 2018-01-13 DIAGNOSIS — R262 Difficulty in walking, not elsewhere classified: Secondary | ICD-10-CM

## 2018-01-13 NOTE — Therapy (Signed)
[place] [facility] MAIN [facility] SERVICES [address] [place], [place], [address] Phone: [phone]   Fax:  [phone]  Occupational Therapy Treatment  Patient Details  Name: [name] MRN: [id number] Date of Birth: 1956/01/08 Referring Provider: Morene Crocker   Encounter Date: 01/13/2018  OT End of Session - 01/13/18 1523    Visit Number  18    Number of Visits  24    Date for OT Re-Evaluation  01/23/18    Authorization Type  visit 8/10 for progress report period starting 12/10/2017    OT Start Time  1515    OT Stop Time  1600    OT Time Calculation (min)  45 min    Equipment Utilized During Treatment  adaptive pens    Activity Tolerance  Patient tolerated treatment well    Behavior During Therapy  [facility] for tasks assessed/performed       Past Medical History:  Diagnosis Date  . Depression   . Headache   . Hyperlipidemia   . MS (multiple sclerosis) (HCC) 1990  . Neurogenic bladder   . Osteoporosis     Past Surgical History:  Procedure Laterality Date  . ABDOMINAL HYSTERECTOMY    . OOPHORECTOMY Bilateral     There were no vitals filed for this visit.  Subjective Assessment - 01/13/18 1522    Subjective   Pt. reports that she forgets things alot.    Pertinent History  Patient reports she was diagnosed with MS years ago and has recently started to feel weak in her left dominant arm and hand.  She denies any previous therapy and reports memory issues and can provide a limited history.     Patient Stated Goals  Patient reports she wants to be able to walk better, use her left hand and be able to write again.     Currently in Pain?  Yes    Pain Score  4     Pain Location  Leg    Pain Orientation  Left    Pain Descriptors / Indicators  Aching    Pain Type  Chronic pain      OT TREATMENT    Neuro muscular re-education:  Pt. worked on left hand [facility] skills grasping, flipping and stacking 2" large pegs on the Instructo board  placed at a tabletop surface. Pt. worked on grasping 1" resistive cubes alternating thumb opposition to the tip of the 2nd through 5th digits while the board is placed at a vertical angle. Pt. worked on pressing the cubes back into place while alternating isolated 2nd through 5th digit extension. Pt. Worked on grasping coins from a tabletop surface, placing them into a resistive container, and pushing them through the slot while isolating his 2nd digit. Pt. worked on translatory movements of the hand moving the objects through the han after storing the coins in the palm of her hand.                          OT Education - 01/13/18 1523    Education provided  Yes    Education Details  UE strength, and coordination    Person(s) Educated  Patient    Methods  Explanation;Demonstration;Verbal cues    Comprehension  Verbal cues required;Returned demonstration;Verbalized understanding          OT Grawe Term Goals - 12/12/17 1930      OT Oyen TERM GOAL #1   Title  Patient will demonstrate holding utensils in her left hand and feeding herself with modified independence with minimal spillage.      Baseline  unable to use left domin hand for self feeding at eval    Time  8    Period  Weeks    Status  On-going      OT Turrell TERM GOAL #2   Title  Patient wil complete tub/shower transfer with supervision only.    Baseline  min assist at eval     Time  8    Period  Weeks    Status  On-going      OT Connery TERM GOAL #3   Title  Patient will complete bathing UB and LB with supervision only.     Baseline  min assist at eval; modified independent on 12-10-17    Time  12    Period  Weeks    Status  Achieved      OT Morey TERM GOAL #4   Title  Patient will increase left UE strength by 1 mm grade to be able to reach into closet to retrieve clothing items.     Baseline  requires assist from aide, 3-/5 strength at eval    Time  12    Period  Weeks    Status  On-going      OT Koltz  TERM GOAL #5   Title  Patient will improve coordination on the left to be able to hold pen securely and write her name with 75% legibility.     Baseline  unable at eval; 6-19 able to hold pen and form letters but decreased legibility    Time  12    Period  Weeks    Status  On-going      OT Wilhite TERM GOAL #6   Title  Patient will complete lower body dressing with modified independence using adaptive equipment if needed.     Baseline  min assist at eval;  able to get pants on, difficulty with tying shoes 6/19    Time  12    Period  Weeks    Status  On-going            Plan - 01/13/18 1524    Clinical Impression Statement  Pt. rpeorts that her personal care aide came back from her vacation, and is planning to go again. Pt. continues to work on improving left hand [facility] skills. Pt. presents with limited motor control, and  fine motor coordination skills.      Occupational Profile and client history currently impacting functional performance  patient is legally blind, progressive disease process with MS for over 20+ years, lives alone, limited availability for transportation.      Occupational performance deficits (Please refer to evaluation for details):  ADL's;Leisure    Rehab Potential  Good    OT Frequency  2x / week    OT Duration  12 weeks    OT Treatment/Interventions  Therapeutic exercise;Cognitive remediation/compensation;Functional Mobility Training    Plan  Positive:  motivation, family support.  Negative:  lives alone, legally blind, requires caregiver 7 days a week for a few hours a day for self care    Clinical Decision Making  Several treatment options, min-mod task modification necessary    Consulted and Agree with Plan of Care  Patient       Patient will benefit from skilled therapeutic intervention in order to improve the following deficits and impairments:  Decreased cognition, Decreased knowledge  of use of DME, Impaired vision/preception, Pain, Decreased coordination,  Decreased activity tolerance, Decreased endurance, Decreased strength, Decreased balance, Impaired UE functional use  Visit Diagnosis: Muscle weakness (generalized)  Other lack of coordination    Problem List Patient Active Problem List   Diagnosis Date Noted  . Lower extremity edema 04/15/2017  . Macrocytic anemia 02/11/2017  . Vitamin D deficiency 02/02/2016  . Mixed hyperlipidemia 01/03/2015  . MS (multiple sclerosis) (HCC) 01/03/2015  . Depression 01/03/2015  . Chronic pain 01/03/2015  . GERD without esophagitis 01/03/2015  . COPD (chronic obstructive pulmonary disease) (HCC) 01/03/2015  . Osteopenia 01/03/2015  . Neurogenic bladder 01/03/2015  . Incontinence in female 01/03/2015    Olegario Messier, MS, OTR/L 01/13/2018, 3:35 PM  [place] [facility] MAIN [facility] SERVICES [address] [place], [place], [address] Phone: [phone]   Fax:  [phone]  Name: [name] MRN: [id number] Date of Birth: [birth year]

## 2018-01-13 NOTE — Therapy (Signed)
[facility] MAIN [facility] SERVICES [address] [place], [place], [address] Phone: [phone]   Fax:  [phone]  Physical Therapy Treatment  Patient Details  Name: [name] MRN: [id number] Date of Birth: 01-08-1956 Referring Provider: Morene Crocker   Encounter Date: 01/13/2018  PT End of Session - 01/13/18 1454    Visit Number  2    Number of Visits  25    Date for PT Re-Evaluation  04/01/18    PT Start Time  0246    PT Stop Time  0315    PT Time Calculation (min)  29 min    Equipment Utilized During Treatment  Gait belt    Activity Tolerance  Patient tolerated treatment well    Behavior During Therapy  [facility] for tasks assessed/performed       Past Medical History:  Diagnosis Date  . Depression   . Headache   . Hyperlipidemia   . MS (multiple sclerosis) (HCC) 1990  . Neurogenic bladder   . Osteoporosis     Past Surgical History:  Procedure Laterality Date  . ABDOMINAL HYSTERECTOMY    . OOPHORECTOMY Bilateral     There were no vitals filed for this visit.  Subjective Assessment - 01/13/18 1453    Subjective  Patient reports 6 months ago she started to feel weak in the left arm, reports she is legally blind.   She reports that she has a weak left leg.     Pertinent History  Patient reports about 3 months ago she started to feel weak in the left arm, reports she is legally blind.       How Caver can you stand comfortably?  not too [place]     Patient Stated Goals  Patient wants to be able to walk better.     Currently in Pain?  Yes    Pain Score  4     Pain Location  Leg    Pain Orientation  Left    Pain Descriptors / Indicators  Aching    Pain Type  Chronic pain    Pain Onset  More than a month ago    Multiple Pain Sites  No     Treatment:   Therapeutic exercise; Hip extension standing  x 15 BLE Hip abd sidelying left and right x 15 BLE Bridges x 10 Standing Hip abduction x15 bilaterally; SLR x15  bilaterally hooklying marching x 15 x 2 Hooklying abd/ER x 15 with OTB   CGA and  mod verbal cues used throughout with decreased stability with motor planning . Continues to have balance deficits typical with diagnosis. Patient performs intermediate level exercises without pain behaviors and needs verbal cuing for postural alignment and head positioning                     PT Education - 01/13/18 1454    Education Details  HEP    Person(s) Educated  Patient    Methods  Explanation;Demonstration    Comprehension  Verbalized understanding;Returned demonstration       PT Short Term Goals - 01/07/18 1213      PT SHORT TERM GOAL #1   Title  Patient will be independent in home exercise program to improve strength/mobility for better functional independence with ADLs.    Time  6    Period  Weeks    Status  New    Target Date  02/18/18      PT SHORT  TERM GOAL #2   Title  nt (< 26 years old) will complete five times sit to stand test in < 10 seconds indicating an increased LE strength and improved balance.    Time  6    Period  Weeks    Status  New    Target Date  02/18/18        PT Bartee Term Goals - 01/07/18 1214      PT Knoch TERM GOAL #1   Title  Patient will increase six minute walk test distance to >1000 for progression to community ambulator and improve gait ability    Time  12    Period  Weeks    Status  New    Target Date  04/01/18      PT [place] TERM GOAL #2   Title  Patient will increase 10 meter walk test to >1.61m/s as to improve gait speed for better community ambulation and to reduce fall risk.    Time  12    Period  Weeks    Status  New    Target Date  04/01/18      PT [place]avan TERM GOAL #3   Title  Patient will reduce timed up and go to <11 seconds to reduce fall risk and demonstrate improved transfer/gait ability.    Time  12    Period  Weeks    Status  New    Target Date  04/01/18            Plan - 01/13/18 1455    Clinical Impression  Statement  Patient instructed in intermediate strengthening and balance exercise.  Patient requires min Vcs for correct exercise technique including to improve LE control with standing exercise. Patient demonstrates better quad control with SLS tasks with rail assist. Patient would benefit from additional skilled PT intervention to improve balance/gait safety and reduce fall risk.    Rehab Potential  Good    PT Frequency  2x / week    PT Duration  12 weeks    PT Treatment/Interventions  Manual techniques;Neuromuscular re-education;Balance training;Therapeutic activities;Therapeutic exercise;Gait training;Moist Heat    PT Next Visit Plan  strengthening, balance training    Consulted and Agree with Plan of Care  Patient       Patient will benefit from skilled therapeutic intervention in order to improve the following deficits and impairments:  Abnormal gait, Decreased balance, Decreased endurance, Decreased mobility, Decreased activity tolerance, Decreased coordination, Decreased strength, Pain, Decreased knowledge of precautions  Visit Diagnosis: Muscle weakness (generalized)  Other lack of coordination  Difficulty in walking, not elsewhere classified     Problem List Patient Active Problem List   Diagnosis Date Noted  . Lower extremity edema 04/15/2017  . Macrocytic anemia 02/11/2017  . Vitamin D deficiency 02/02/2016  . Mixed hyperlipidemia 01/03/2015  . MS (multiple sclerosis) (HCC) 01/03/2015  . Depression 01/03/2015  . Chronic pain 01/03/2015  . GERD without esophagitis 01/03/2015  . COPD (chronic obstructive pulmonary disease) (HCC) 01/03/2015  . Osteopenia 01/03/2015  . Neurogenic bladder 01/03/2015  . Incontinence in female 01/03/2015    Ezekiel Ina, PT DPT 01/13/2018, 2:56 PM  [facility] MAIN [facility] SERVICES [address] [place], [place], [address] Phone: [phone]   Fax:  [phone]  Name: [name] MRN:  [id number] Date of Birth: September 03, 1955

## 2018-01-15 ENCOUNTER — Ambulatory Visit: Payer: Medicare Other | Admitting: Occupational Therapy

## 2018-01-19 ENCOUNTER — Encounter: Payer: Self-pay | Admitting: Occupational Therapy

## 2018-01-19 ENCOUNTER — Ambulatory Visit: Payer: Medicare Other | Admitting: Occupational Therapy

## 2018-01-19 ENCOUNTER — Encounter: Payer: Self-pay | Admitting: Physical Therapy

## 2018-01-19 ENCOUNTER — Ambulatory Visit: Payer: Medicare Other | Admitting: Physical Therapy

## 2018-01-19 DIAGNOSIS — M6281 Muscle weakness (generalized): Secondary | ICD-10-CM

## 2018-01-19 DIAGNOSIS — R262 Difficulty in walking, not elsewhere classified: Secondary | ICD-10-CM

## 2018-01-19 DIAGNOSIS — R278 Other lack of coordination: Secondary | ICD-10-CM

## 2018-01-19 NOTE — Therapy (Signed)
[place] [facility] MAIN [facility] SERVICES [address] [place], [place], [address] Phone: [phone]   Fax:  [phone]  Occupational Therapy Treatment/Recertification Note/ and Progress Report period starting 12/10/2017-01/19/2018  Patient Details  Name: [name] MRN: [id number] Date of Birth: Aug 23, 1955 Referring Provider: Morene Crocker   Encounter Date: 01/19/2018  OT End of Session - 01/19/18 1344    Visit Number  19    Number of Visits  24    Date for OT Re-Evaluation  04/13/18    Authorization Type  *(progress note and recert completed. Start vist at 1/10 next visit with new date) visit 9/10 for progress report period starting 12/10/2017     OT Start Time  1335    OT Stop Time  1420    OT Time Calculation (min)  45 min    Equipment Utilized During Treatment  adaptive pens    Activity Tolerance  Patient tolerated treatment well    Behavior During Therapy  WFL for tasks assessed/performed       Past Medical History:  Diagnosis Date  . Depression   . Headache   . Hyperlipidemia   . MS (multiple sclerosis) (HCC) 1990  . Neurogenic bladder   . Osteoporosis     Past Surgical History:  Procedure Laterality Date  . ABDOMINAL HYSTERECTOMY    . OOPHORECTOMY Bilateral     There were no vitals filed for this visit.  Subjective Assessment - 01/19/18 1341    Subjective   Pt. requires verbal cues to navigate through the gym.    Pertinent History  Patient reports she was diagnosed with MS years ago and has recently started to feel weak in her left dominant arm and hand.  She denies any previous therapy and reports memory issues and can provide a limited history.     Patient Stated Goals  Patient reports she wants to be able to walk better, use her left hand and be able to write again.     Currently in Pain?  Yes    Pain Score  4     Pain Location  Leg    Pain Orientation  Left    Pain Descriptors / Indicators  Aching    Pain Type   Chronic pain         [facility] OT Assessment - 01/19/18 1413      Coordination   Left 9 Hole Peg Test  43 sec.      Strength   Overall Strength Comments  LUE 3+/5 overall      Hand Function   Left Hand Grip (lbs)  22    Left Hand Lateral Pinch  15 lbs    Left 3 point pinch  11 lbs      OT TREATMENT    Measurements were obtained, and goals were reviewed with the pt.  Neuro muscular re-education:  Pt. worked on grasping, flipping and stacking 2" large pegs on the [employer] board placed at a tabletop surface.                   OT Education - 01/19/18 1344    Education provided  Yes    Education Details  UE strength, and coordination    Person(s) Educated  Patient    Methods  Explanation;Demonstration;Verbal cues    Comprehension  Verbal cues required;Returned demonstration;Verbalized understanding          OT Kelly Term Goals - 01/19/18 1401  OT Garrido TERM GOAL #1   Title  Patient will demonstrate holding utensils in her left hand and feeding herself with modified independence with minimal spillage.      Baseline  01/19/2018: Pt. requires minA    Time  8    Period  Weeks    Status  On-going    Target Date  04/13/18      OT Robb TERM GOAL #2   Title  Patient wil complete tub/shower transfer with supervision only.    Baseline  min assist at eval     Time  8    Period  Weeks    Status  On-going    Target Date  04/13/18      OT Matt TERM GOAL #4   Title  Patient will increase left UE strength by 1 mm grade to be able to reach into closet to retrieve clothing items.     Baseline  Pt. conitnues to require assist from aide, 3+/5 strength at eval    Time  12    Period  Weeks    Status  On-going    Target Date  04/13/18      OT Wisinski TERM GOAL #5   Title  Patient will improve coordination on the left to be able to hold pen securely and write her name with 75% legibility.     Baseline  Pt. continues to present with decreased legibility with printing  her name.     Time  12    Period  Weeks    Status  On-going    Target Date  04/13/18      OT Keay TERM GOAL #6   Title  Patient will complete lower body dressing with modified independence using adaptive equipment if needed.     Baseline  Pt. continues to require minA able to get pants on, difficulty with tying shoes 6/19    Time  12    Period  Weeks    Status  On-going    Target Date  04/13/18            Plan - 01/19/18 1350    Clinical Impression Statement  Pt. is making steady progress. Pt. is now able to assist with using her left hand to perfrom self-feeding using utensils including spoons, and forks. Pt. is now able to assist with cutting her meat, and is able to use her left hand to eat ice an ice cream cone. Pt. is improving with ADLs tasks, howver conitnues to require assist for tying shoes.  Pt.'s writing is improving, however she continues to require work to improve writing legibility. Pt. continues to beneift from skilled OT services services to improve ADL, and IADL functioning.    Occupational Profile and client history currently impacting functional performance  patient is legally blind, progressive disease process with MS for over 20+ years, lives alone, limited availability for transportation.      Occupational performance deficits (Please refer to evaluation for details):  ADL's;Leisure    Rehab Potential  Good    OT Frequency  2x / week    OT Duration  12 weeks    OT Treatment/Interventions  Therapeutic exercise;Cognitive remediation/compensation;Functional Mobility Training    Plan  Positive:  motivation, family support.  Negative:  lives alone, legally blind, requires caregiver 7 days a week for a few hours a day for self care    Clinical Decision Making  Several treatment options, min-mod task modification necessary    Consulted and  Agree with Plan of Care  Patient       Patient will benefit from skilled therapeutic intervention in order to improve the  following deficits and impairments:  Decreased cognition, Decreased knowledge of use of DME, Impaired vision/preception, Pain, Decreased coordination, Decreased activity tolerance, Decreased endurance, Decreased strength, Decreased balance, Impaired UE functional use  Visit Diagnosis: Muscle weakness (generalized)  Other lack of coordination    Problem List Patient Active Problem List   Diagnosis Date Noted  . Lower extremity edema 04/15/2017  . Macrocytic anemia 02/11/2017  . Vitamin D deficiency 02/02/2016  . Mixed hyperlipidemia 01/03/2015  . MS (multiple sclerosis) (HCC) 01/03/2015  . Depression 01/03/2015  . Chronic pain 01/03/2015  . GERD without esophagitis 01/03/2015  . COPD (chronic obstructive pulmonary disease) (HCC) 01/03/2015  . Osteopenia 01/03/2015  . Neurogenic bladder 01/03/2015  . Incontinence in female 01/03/2015    Olegario Messier, MS,OTR/L 01/19/2018, 2:35 PM  [place] MAIN [facility] SERVICES [address] [place], [place], [address] Phone: [phone]   Fax:  [phone]  Name: NOELIE RENFROW MRN: [id number] Date of Birth: [birth year]

## 2018-01-19 NOTE — Therapy (Signed)
[facility] MAIN [facility] SERVICES [address] [place], [place], [address] Phone: [phone]   Fax:  [phone]  Physical Therapy Treatment  Patient Details  Name: [name] MRN: [id number] Date of Birth: 05-31-1956 Referring Provider: Morene Crocker   Encounter Date: 01/19/2018  PT End of Session - 01/19/18 1446    Visit Number  3    Number of Visits  25    Date for PT Re-Evaluation  04/01/18    PT Start Time  0232    PT Stop Time  0312    PT Time Calculation (min)  40 min    Equipment Utilized During Treatment  Gait belt    Activity Tolerance  Patient tolerated treatment well    Behavior During Therapy  [facility] for tasks assessed/performed       Past Medical History:  Diagnosis Date  . Depression   . Headache   . Hyperlipidemia   . MS (multiple sclerosis) (HCC) 1990  . Neurogenic bladder   . Osteoporosis     Past Surgical History:  Procedure Laterality Date  . ABDOMINAL HYSTERECTOMY    . OOPHORECTOMY Bilateral     There were no vitals filed for this visit.  Subjective Assessment - 01/19/18 1444    Subjective  Patient reports 6 months ago she started to feel weak in the left arm, reports she is legally blind.   She reports that she has a weak left leg.     Pertinent History  Patient reports about 3 months ago she started to feel weak in the left arm, reports she is legally blind.       How Se[place]y can you stand comfortably?  not too [place]     Patient Stated Goals  Patient wants to be able to walk better.     Pain Score  3     Pain Location  Leg    Pain Orientation  Left    Pain Descriptors / Indicators  Aching    Pain Onset  More than a month ago    Pain Frequency  Constant    Aggravating Factors   walking    Effect of Pain on Daily Activities  difficulty to perofrm activities    Multiple Pain Sites  No       Treatment: Therapeutic exercise: : Side stepping in parallel bars with YTB 10 feet x 5 with cue to keep her  hips parallel to the bars Step ups to 6 inch stool x 20  Step downs from 6 inch stool with left LE to tap heel and push back up to 6 inch stool x 10  SAQ with 2 lbs LLE , 3 lbs RLE and 3 sec hold x 20 x 2  Hooklying marching x 20 with 2 lbs, RLE and 3 lbs LLE  Bridging with SLR x 10 LLE with 2 lb, then 10 without weight on LLE sidelying hip extension x 10  Standing hip extension x 10 x 2  Standing hip abd  x 10 x 2  Leg press 90 lbs x 20 x 3    Patient is fatigued following therapy and has no reports of pain.                         PT Education - 01/19/18 1446    Education Details  HEP    Person(s) Educated  Patient    Methods  Explanation;Demonstration    Comprehension  Verbalized understanding;Returned demonstration       PT Short Term Goals - 01/07/18 1213      PT SHORT TERM GOAL #1   Title  Patient will be independent in home exercise program to improve strength/mobility for better functional independence with ADLs.    Time  6    Period  Weeks    Status  New    Target Date  02/18/18      PT SHORT TERM GOAL #2   Title  nt (< 14 years old) will complete five times sit to stand test in < 10 seconds indicating an increased LE strength and improved balance.    Time  6    Period  Weeks    Status  New    Target Date  02/18/18        PT [place] Term Goals - 01/07/18 1214      PT Manzo TERM GOAL #1   Title  Patient will increase six minute walk test distance to >1000 for progression to community ambulator and improve gait ability    Time  12    Period  Weeks    Status  New    Target Date  04/01/18      PT Manson TERM GOAL #2   Title  Patient will increase 10 meter walk test to >1.4m/s as to improve gait speed for better community ambulation and to reduce fall risk.    Time  12    Period  Weeks    Status  New    Target Date  04/01/18      PT Feild TERM GOAL #3   Title  Patient will reduce timed up and go to <11 seconds to reduce fall risk and  demonstrate improved transfer/gait ability.    Time  12    Period  Weeks    Status  New    Target Date  04/01/18            Plan - 01/19/18 1447    Clinical Impression Statement  Patient instructed in intermediate balance/strengthening exercise. Patient fatigues quickly requiring short rest breaks in between intermediate exercise. Patient instructed in advanced strengthening with increased repetition/resistance. Patient requires CGA for intermediate balance exercise especially with less rail assist. Patient would benefit from additional skilled PT intervention to improve balance/gait safety and reduce fall risk.    Rehab Potential  Good    PT Frequency  2x / week    PT Duration  12 weeks    PT Treatment/Interventions  Manual techniques;Neuromuscular re-education;Balance training;Therapeutic activities;Therapeutic exercise;Gait training;Moist Heat    PT Next Visit Plan  strengthening, balance training    Consulted and Agree with Plan of Care  Patient       Patient will benefit from skilled therapeutic intervention in order to improve the following deficits and impairments:  Abnormal gait, Decreased balance, Decreased endurance, Decreased mobility, Decreased activity tolerance, Decreased coordination, Decreased strength, Pain, Decreased knowledge of precautions  Visit Diagnosis: Muscle weakness (generalized)  Other lack of coordination  Difficulty in walking, not elsewhere classified     Problem List Patient Active Problem List   Diagnosis Date Noted  . Lower extremity edema 04/15/2017  . Macrocytic anemia 02/11/2017  . Vitamin D deficiency 02/02/2016  . Mixed hyperlipidemia 01/03/2015  . MS (multiple sclerosis) (HCC) 01/03/2015  . Depression 01/03/2015  . Chronic pain 01/03/2015  . GERD without esophagitis 01/03/2015  . COPD (chronic obstructive pulmonary disease) (HCC) 01/03/2015  . Osteopenia 01/03/2015  .  Neurogenic bladder 01/03/2015  . Incontinence in female  01/03/2015    Ezekiel Ina, PT DPT 01/19/2018, 2:48 PM  [place] [facility] MAIN [facility] SERVICES [address] [place], [place], [address] Phone: [phone]   Fax:  [phone]  Name: [name] MRN: [id number] Date of Birth: [birth year]

## 2018-01-19 NOTE — Therapy (Signed)
[facility] MAIN [facility] SERVICES [address] [place], [place], [address] Phone: [phone]   Fax:  [phone]  Occupational Therapy Treatment  Patient Details  Name: [name] MRN: [id number] Date of Birth: February 06, 1956 Referring Provider: Morene Crocker   Encounter Date: 01/19/2018  OT End of Session - 01/19/18 1344    Visit Number  19    Number of Visits  24    Date for OT Re-Evaluation  04/13/18    Authorization Type  *(progress note and recert completed. Start vist at 1/10 next visit with new date) visit 9/10 for progress report period starting 12/10/2017     OT Start Time  1335    OT Stop Time  1420    OT Time Calculation (min)  45 min    Equipment Utilized During Treatment  adaptive pens    Activity Tolerance  Patient tolerated treatment well    Behavior During Therapy  WFL for tasks assessed/performed       Past Medical History:  Diagnosis Date  . Depression   . Headache   . Hyperlipidemia   . MS (multiple sclerosis) (HCC) 1990  . Neurogenic bladder   . Osteoporosis     Past Surgical History:  Procedure Laterality Date  . ABDOMINAL HYSTERECTOMY    . OOPHORECTOMY Bilateral     There were no vitals filed for this visit.  Subjective Assessment - 01/19/18 1341    Pertinent History  Patient reports she was diagnosed with MS years ago and has recently started to feel weak in her left dominant arm and hand.  She denies any previous therapy and reports memory issues and can provide a limited history.     Patient Stated Goals  Patient reports she wants to be able to walk better, use her left hand and be able to write again.     Currently in Pain?  Yes    Pain Score  4     Pain Location  Leg    Pain Orientation  Left    Pain Descriptors / Indicators  Aching    Pain Type  Chronic pain         [facility] OT Assessment - 01/19/18 1413      Coordination   Left 9 Hole Peg Test  43 sec.      Strength   Overall Strength  Comments  LUE 3+/5 overall      Hand Function   Left Hand Grip (lbs)  22    Left Hand Lateral Pinch  15 lbs    Left 3 point pinch  11 lbs                       OT Education - 01/19/18 1344    Education provided  Yes    Education Details  UE strength, and coordination    Person(s) Educated  Patient    Methods  Explanation;Demonstration;Verbal cues    Comprehension  Verbal cues required;Returned demonstration;Verbalized understanding          OT Hoaglin Term Goals - 01/19/18 1401      OT Archambault TERM GOAL #1   Title  Patient will demonstrate holding utensils in her left hand and feeding herself with modified independence with minimal spillage.      Baseline  01/19/2018: Pt. requires minA    Time  8    Period  Weeks    Status  On-going    Target Date  04/13/18      OT Fauth TERM GOAL #2   Title  Patient wil complete tub/shower transfer with supervision only.    Baseline  min assist at eval     Time  8    Period  Weeks    Status  On-going    Target Date  04/13/18      OT Amberg TERM GOAL #4   Title  Patient will increase left UE strength by 1 mm grade to be able to reach into closet to retrieve clothing items.     Baseline  Pt. conitnues to require assist from aide, 3+/5 strength at eval    Time  12    Period  Weeks    Status  On-going    Target Date  04/13/18      OT Leibold TERM GOAL #5   Title  Patient will improve coordination on the left to be able to hold pen securely and write her name with 75% legibility.     Baseline  Pt. continues to present with decreased legibility with printing her name.     Time  12    Period  Weeks    Status  On-going    Target Date  04/13/18      OT Spindler TERM GOAL #6   Title  Patient will complete lower body dressing with modified independence using adaptive equipment if needed.     Baseline  Pt. continues to require minA able to get pants on, difficulty with tying shoes 6/19    Time  12    Period  Weeks    Status  On-going     Target Date  04/13/18            Plan - 01/19/18 1350    Clinical Impression Statement Pt. is making steady progress. Pt. is now able to assist with using her left hand to perfrom self-feeding using utensils including spoons, and forks. Pt. is now able to assist with cutting her meat, and is able to use her left hand to eat ice an ice cream cone. Pt. is improving with ADLs tasks, howver conitnues to require assist for tying shoes.  Pt.'s writing is improving, however she continues to require work to improve writing legibility. Pt. continues to beneift from skilled OT services services to improve ADL, and IADL functioning. Goals were reviewed with the patient.   Occupational Profile and client history currently impacting functional performance  patient is legally blind, progressive disease process with MS for over 20+ years, lives alone, limited availability for transportation.      Occupational performance deficits (Please refer to evaluation for details):  ADL's;Leisure    Rehab Potential  Good    OT Frequency  2x / week    OT Duration  12 weeks    OT Treatment/Interventions  Therapeutic exercise;Cognitive remediation/compensation;Functional Mobility Training    Plan  Positive:  motivation, family support.  Negative:  lives alone, legally blind, requires caregiver 7 days a week for a few hours a day for self care    Clinical Decision Making  Several treatment options, min-mod task modification necessary    Consulted and Agree with Plan of Care  Patient       Patient will benefit from skilled therapeutic intervention in order to improve the following deficits and impairments:  Decreased cognition, Decreased knowledge of use of DME, Impaired vision/preception, Pain, Decreased coordination, Decreased activity tolerance, Decreased endurance, Decreased strength, Decreased balance, Impaired UE functional use  Visit  Diagnosis: Muscle weakness (generalized)  Other lack of  coordination    Problem List Patient Active Problem List   Diagnosis Date Noted  . Lower extremity edema 04/15/2017  . Macrocytic anemia 02/11/2017  . Vitamin D deficiency 02/02/2016  . Mixed hyperlipidemia 01/03/2015  . MS (multiple sclerosis) (HCC) 01/03/2015  . Depression 01/03/2015  . Chronic pain 01/03/2015  . GERD without esophagitis 01/03/2015  . COPD (chronic obstructive pulmonary disease) (HCC) 01/03/2015  . Osteopenia 01/03/2015  . Neurogenic bladder 01/03/2015  . Incontinence in female 01/03/2015    Olegario Messier, MS, OTR/L 01/19/2018, 2:32 PM  [facility] MAIN [facility] SERVICES [address] [place], [place], [address] Phone: [phone]   Fax:  [phone]  Name: [name] MRN: [id number] Date of Birth: 1956-05-03

## 2018-01-19 NOTE — Addendum Note (Signed)
Addended by: Avon Gully on: 01/19/2018 02:43 PM   Modules accepted: Orders

## 2018-01-21 ENCOUNTER — Ambulatory Visit: Payer: Medicare Other | Admitting: Occupational Therapy

## 2018-01-21 ENCOUNTER — Encounter: Payer: Self-pay | Admitting: Occupational Therapy

## 2018-01-21 ENCOUNTER — Ambulatory Visit: Payer: Medicare Other | Admitting: Physical Therapy

## 2018-01-21 ENCOUNTER — Encounter: Payer: Self-pay | Admitting: Physical Therapy

## 2018-01-21 DIAGNOSIS — R278 Other lack of coordination: Secondary | ICD-10-CM

## 2018-01-21 DIAGNOSIS — R262 Difficulty in walking, not elsewhere classified: Secondary | ICD-10-CM

## 2018-01-21 DIAGNOSIS — M6281 Muscle weakness (generalized): Secondary | ICD-10-CM

## 2018-01-21 NOTE — Therapy (Signed)
[facility] MAIN [facility] SERVICES [address] [place], [place], [address] Phone: [phone]   Fax:  [phone]  Occupational Therapy Treatment  Patient Details  Name: [name] MRN: [id number] Date of Birth: 1956/05/02 Referring Provider: Morene Crocker   Encounter Date: 01/21/2018  OT End of Session - 01/21/18 1405    Visit Number  20    Number of Visits  24    Date for OT Re-Evaluation  04/13/18    Authorization Type  Visit 1 of 10 for progress report period starting 01/21/2018    OT Start Time  1345    Equipment Utilized During Treatment  adaptive pens    Activity Tolerance  Patient tolerated treatment well    Behavior During Therapy  [facility] for tasks assessed/performed       Past Medical History:  Diagnosis Date  . Depression   . Headache   . Hyperlipidemia   . MS (multiple sclerosis) (HCC) 1990  . Neurogenic bladder   . Osteoporosis     Past Surgical History:  Procedure Laterality Date  . ABDOMINAL HYSTERECTOMY    . OOPHORECTOMY Bilateral     There were no vitals filed for this visit.  Subjective Assessment - 01/21/18 1404    Subjective   Pt. requires verbal cues to navigate through the gym.    Pertinent History  Patient reports she was diagnosed with MS years ago and has recently started to feel weak in her left dominant arm and hand.  She denies any previous therapy and reports memory issues and can provide a limited history.     Patient Stated Goals  Patient reports she wants to be able to walk better, use her left hand and be able to write again.     Currently in Pain?  No/denies      OT TREATMENT    Neuro muscular re-education:  Pt. worked on Copy, and [facility] skills grasping, flipping and stacking 2" large pegs on the Instructo board placed at a tabletop surface. Pt. worked on grasping 1" resistive cubes alternating thumb opposition to the tip of the 2nd through 5th digits while the board is placed at a  vertical angle. Pt. worked on pressing the cubes back into place while alternating isolated 2nd through 5th digit extension. Pt. Required cues for the movement patterns. Pt. worked on Gaffer of names. Pt. Pt. continues to work on improving spacing, and writing legibility.                             OT Education - 01/21/18 1404    Education provided  Yes    Education Details  UE strength, and coordination    Person(s) Educated  Patient    Methods  Explanation;Demonstration;Verbal cues    Comprehension  Verbal cues required;Returned demonstration;Verbalized understanding          OT Vinzant Term Goals - 01/19/18 1401      OT Hulick TERM GOAL #1   Title  Patient will demonstrate holding utensils in her left hand and feeding herself with modified independence with minimal spillage.      Baseline  01/19/2018: Pt. requires minA    Time  8    Period  Weeks    Status  On-going    Target Date  04/13/18      OT Hoctor TERM GOAL #2   Title  Patient wil complete tub/shower transfer with  supervision only.    Baseline  min assist at eval     Time  8    Period  Weeks    Status  On-going    Target Date  04/13/18      OT Tenenbaum TERM GOAL #4   Title  Patient will increase left UE strength by 1 mm grade to be able to reach into closet to retrieve clothing items.     Baseline  Pt. conitnues to require assist from aide, 3+/5 strength at eval    Time  12    Period  Weeks    Status  On-going    Target Date  04/13/18      OT Melle TERM GOAL #5   Title  Patient will improve coordination on the left to be able to hold pen securely and write her name with 75% legibility.     Baseline  Pt. continues to present with decreased legibility with printing her name.     Time  12    Period  Weeks    Status  On-going    Target Date  04/13/18      OT Contino TERM GOAL #6   Title  Patient will complete lower body dressing with modified independence using adaptive equipment if needed.      Baseline  Pt. continues to require minA able to get pants on, difficulty with tying shoes 6/19    Time  12    Period  Weeks    Status  On-going    Target Date  04/13/18            Plan - 01/21/18 1406    Clinical Impression Statement  Pt. reports that her son has not been by to visit in awhile. Pt. reports she has been waiting for him, however its been hard for him to get around. Pt.'s caregiver is planning to go on vacation over the next 2 weeks. Pt. reports that she doesn't think she will have a new caregiver in her place. Pt. Requires cues to navigate through the gym. Pt. presents with limited left UE strength, motor control, and [facility] skills. Pt. continues to work on improving hand function skills for improved ADLs, and IADLs.     Occupational Profile and client history currently impacting functional performance  patient is legally blind, progressive disease process with MS for over 20+ years, lives alone, limited availability for transportation.      Occupational performance deficits (Please refer to evaluation for details):  ADL's;Leisure    Rehab Potential  Good    OT Frequency  2x / week    OT Duration  12 weeks    OT Treatment/Interventions  Therapeutic exercise;Cognitive remediation/compensation;Functional Mobility Training    Plan  Positive:  motivation, family support.  Negative:  lives alone, legally blind, requires caregiver 7 days a week for a few hours a day for self care    Clinical Decision Making  Several treatment options, min-mod task modification necessary    Consulted and Agree with Plan of Care  Patient       Patient will benefit from skilled therapeutic intervention in order to improve the following deficits and impairments:  Decreased cognition, Decreased knowledge of use of DME, Impaired vision/preception, Pain, Decreased coordination, Decreased activity tolerance, Decreased endurance, Decreased strength, Decreased balance, Impaired UE functional use  Visit  Diagnosis: Muscle weakness (generalized)  Other lack of coordination    Problem List Patient Active Problem List   Diagnosis Date Noted  .  Lower extremity edema 04/15/2017  . Macrocytic anemia 02/11/2017  . Vitamin D deficiency 02/02/2016  . Mixed hyperlipidemia 01/03/2015  . MS (multiple sclerosis) (HCC) 01/03/2015  . Depression 01/03/2015  . Chronic pain 01/03/2015  . GERD without esophagitis 01/03/2015  . COPD (chronic obstructive pulmonary disease) (HCC) 01/03/2015  . Osteopenia 01/03/2015  . Neurogenic bladder 01/03/2015  . Incontinence in female 01/03/2015    Olegario Messier, MS, OTR/L 01/21/2018, 4:39 PM  [facility] MAIN [facility] SERVICES [address] [place], [place], [address] Phone: [phone]   Fax:  [phone]  Name: [name] MRN: [id number] Date of Birth: [birth year]

## 2018-01-21 NOTE — Therapy (Signed)
[facility] MAIN [facility] SERVICES [address] [place], [place], [address] Phone: [phone]   Fax:  [phone]  Physical Therapy Treatment  Patient Details  Name: [name] MRN: [id number] Date of Birth: [birth year] Referring Provider: Morene Crocker   Encounter Date: 01/21/2018  PT End of Session - 01/21/18 1445    Visit Number  4    Number of Visits  25    Date for PT Re-Evaluation  04/01/18    PT Start Time  0233    PT Stop Time  0312    PT Time Calculation (min)  39 min    Equipment Utilized During Treatment  Gait belt    Activity Tolerance  Patient tolerated treatment well    Behavior During Therapy  [facility] for tasks assessed/performed       Past Medical History:  Diagnosis Date  . Depression   . Headache   . Hyperlipidemia   . MS (multiple sclerosis) (HCC) 1990  . Neurogenic bladder   . Osteoporosis     Past Surgical History:  Procedure Laterality Date  . ABDOMINAL HYSTERECTOMY    . OOPHORECTOMY Bilateral     There were no vitals filed for this visit.  Subjective Assessment - 01/21/18 1444    Subjective  Patient reports 6 months ago she started to feel weak in the left arm, reports she is legally blind.   She reports that she has a weak left leg.     Pertinent History  Patient reports about 3 months ago she started to feel weak in the left arm, reports she is legally blind.       How Twining can you stand comfortably?  not too Huizenga     Patient Stated Goals  Patient wants to be able to walk better.     Currently in Pain?  Yes    Pain Score  6     Pain Location  Leg    Pain Orientation  Left    Pain Descriptors / Indicators  Aching    Pain Type  Chronic pain    Pain Onset  More than a month ago    Aggravating Factors   Walking    Pain Relieving Factors  rest    Multiple Pain Sites  No       Treatment:  Heel raises x 10 x 2 ; cues to not rock forward Resisted side-steeping YTB 4 lengths x 2;; cues to not rotate  trunk towards direction of mobility Standing mini squats 2 x 10 with YTB around knees to encourage abduction; cues to maintain correct erect posture Sit to stand without UE support 2 x 10; cues for technique and posture correction Step-ups to 6" step x 10 bilateral; cues for getting entire foot on step Quantum leg press 90 # x 20 x 3; cues for not snapping LE in extension and performing slowly 3 way hip with YTB and cues for trunk position and technique  Gait training with TM . 7 miles / hour x 5 mins , VC for slower and longer steps  Rollator with SBA 500 feet with vc for heel toe gait   Min assist and  mod verbal cues used throughout with increased in postural sway and LOB most seen with narrow base of support and while on uneven surfaces. Continues to have balance deficits typical with diagnosis. Patient performs intermediate level exercises without pain behaviors and needs verbal cuing for postural alignment and head positioning  PT Education - 01/21/18 1444    Education Details  HEP    Person(s) Educated  Patient    Methods  Explanation    Comprehension  Verbalized understanding       PT Short Term Goals - 01/07/18 1213      PT SHORT TERM GOAL #1   Title  Patient will be independent in home exercise program to improve strength/mobility for better functional independence with ADLs.    Time  6    Period  Weeks    Status  New    Target Date  02/18/18      PT SHORT TERM GOAL #2   Title  nt (< 72 years old) will complete five times sit to stand test in < 10 seconds indicating an increased LE strength and improved balance.    Time  6    Period  Weeks    Status  New    Target Date  02/18/18        PT Mauney Term Goals - 01/07/18 1214      PT Cassada TERM GOAL #1   Title  Patient will increase six minute walk test distance to >1000 for progression to community ambulator and improve gait ability    Time  12    Period  Weeks    Status  New     Target Date  04/01/18      PT Muldoon TERM GOAL #2   Title  Patient will increase 10 meter walk test to >1.2m/s as to improve gait speed for better community ambulation and to reduce fall risk.    Time  12    Period  Weeks    Status  New    Target Date  04/01/18      PT Mcphail TERM GOAL #3   Title  Patient will reduce timed up and go to <11 seconds to reduce fall risk and demonstrate improved transfer/gait ability.    Time  12    Period  Weeks    Status  New    Target Date  04/01/18            Plan - 01/21/18 1446    Clinical Impression Statement  Pt requires direction and verbal cues for correct performance of exercises. Patient demonstrates weakness in BLE and performs open and closed chain exercises with minimal reports of pain to left knee. Pt was able to perform all exercises with max assist and VC for technique..   Patient struggles with speed during movement as well as balance with unstable surfaces.  Pt encouraged continuing new supine HEP .Follow-up as scheduled.    Rehab Potential  Good    PT Frequency  2x / week    PT Duration  12 weeks    PT Treatment/Interventions  Manual techniques;Neuromuscular re-education;Balance training;Therapeutic activities;Therapeutic exercise;Gait training;Moist Heat    PT Next Visit Plan  strengthening, balance training    Consulted and Agree with Plan of Care  Patient       Patient will benefit from skilled therapeutic intervention in order to improve the following deficits and impairments:  Abnormal gait, Decreased balance, Decreased endurance, Decreased mobility, Decreased activity tolerance, Decreased coordination, Decreased strength, Pain, Decreased knowledge of precautions  Visit Diagnosis: Muscle weakness (generalized)  Other lack of coordination  Difficulty in walking, not elsewhere classified     Problem List Patient Active Problem List   Diagnosis Date Noted  . Lower extremity edema 04/15/2017  . Macrocytic anemia  02/11/2017  .  Vitamin D deficiency 02/02/2016  . Mixed hyperlipidemia 01/03/2015  . MS (multiple sclerosis) (HCC) 01/03/2015  . Depression 01/03/2015  . Chronic pain 01/03/2015  . GERD without esophagitis 01/03/2015  . COPD (chronic obstructive pulmonary disease) (HCC) 01/03/2015  . Osteopenia 01/03/2015  . Neurogenic bladder 01/03/2015  . Incontinence in female 01/03/2015    Ezekiel Ina, PT DPT 01/21/2018, 2:47 PM  [facility] MAIN [facility] SERVICES [address] [place], [place], [address] Phone: [phone]   Fax:  [phone]  Name: [name] MRN: [id number] Date of Birth: 10/07/1955

## 2018-01-26 ENCOUNTER — Ambulatory Visit: Payer: Medicare Other | Admitting: Physical Therapy

## 2018-01-26 ENCOUNTER — Ambulatory Visit: Payer: Medicare Other | Attending: Neurology | Admitting: Occupational Therapy

## 2018-01-26 DIAGNOSIS — R262 Difficulty in walking, not elsewhere classified: Secondary | ICD-10-CM | POA: Insufficient documentation

## 2018-01-26 DIAGNOSIS — M6281 Muscle weakness (generalized): Secondary | ICD-10-CM | POA: Insufficient documentation

## 2018-01-26 DIAGNOSIS — R278 Other lack of coordination: Secondary | ICD-10-CM | POA: Insufficient documentation

## 2018-01-28 ENCOUNTER — Encounter: Payer: Self-pay | Admitting: Occupational Therapy

## 2018-01-28 ENCOUNTER — Encounter: Payer: Self-pay | Admitting: Physical Therapy

## 2018-01-28 ENCOUNTER — Ambulatory Visit: Payer: Medicare Other | Admitting: Physical Therapy

## 2018-01-28 ENCOUNTER — Ambulatory Visit: Payer: Medicare Other | Admitting: Occupational Therapy

## 2018-01-28 DIAGNOSIS — M6281 Muscle weakness (generalized): Secondary | ICD-10-CM

## 2018-01-28 DIAGNOSIS — R278 Other lack of coordination: Secondary | ICD-10-CM | POA: Diagnosis not present

## 2018-01-28 DIAGNOSIS — R262 Difficulty in walking, not elsewhere classified: Secondary | ICD-10-CM | POA: Diagnosis not present

## 2018-01-28 NOTE — Therapy (Signed)
[facility] MAIN [facility] SERVICES [address] [place], [place], [address] Phone: [phone]   Fax:  [phone]  Physical Therapy Treatment  Patient Details  Name: [name] MRN: [id number] Date of Birth: 1955/07/01 Referring Provider: Morene Crocker   Encounter Date: 01/28/2018  PT End of Session - 01/28/18 1357    Visit Number  5    Number of Visits  25    Date for PT Re-Evaluation  04/01/18    PT Start Time  0150    PT Stop Time  0230    PT Time Calculation (min)  40 min    Equipment Utilized During Treatment  Gait belt    Activity Tolerance  Patient tolerated treatment well    Behavior During Therapy  [facility] for tasks assessed/performed       Past Medical History:  Diagnosis Date  . Depression   . Headache   . Hyperlipidemia   . MS (multiple sclerosis) (HCC) 1990  . Neurogenic bladder   . Osteoporosis     Past Surgical History:  Procedure Laterality Date  . ABDOMINAL HYSTERECTOMY    . OOPHORECTOMY Bilateral     There were no vitals filed for this visit.  Subjective Assessment - 01/28/18 1356    Subjective  Patient reports that she is doing ok today.    Pertinent History  Patient reports about 3 months ago she started to feel weak in the left arm, reports she is legally blind.       How Musto can you stand comfortably?  not too Abrahamsen     Patient Stated Goals  Patient wants to be able to walk better.     Currently in Pain?  Yes    Pain Score  6     Pain Location  Leg    Pain Orientation  Left    Pain Descriptors / Indicators  Aching    Pain Type  Chronic pain    Pain Onset  More than a month ago    Aggravating Factors   standing    Pain Relieving Factors  none    Effect of Pain on Daily Activities  walking is harder    Multiple Pain Sites  No       Treatment:  TM x . 5 miles/ hour x 5 mins   Leg press x 20 90 lbs   Lunge into BOSU ball x 15 BLE with cues to bend her knee fwd more and keeping her back leg  further back  Step ups to 6 inch stool x 20 BLE, HHA  Step backwards to 6 inch stool x 20 BLE, HHA  Side stepping over 6 inch step stool x 20 BLE, HHA  Toe taps to 6 inch stool x 20 , HHA  Standing hip abd, hip ext with RTB x 10 x 2 ,HHA  Heel raises x 20,HHA  Squats x 10 with 3 second hold, HHA  Sit to stand with 2 lb rod x 10 reps  CGA and Min  verbal cues used throughout. Continues to have balance deficits typical with diagnosis. Patient performs intermediate level exercises without pain behaviors and needs verbal cuing for postural alignment                              PT Education - 01/28/18 1357    Education Details  HEP    Person(s) Educated  Patient  Methods  Explanation    Comprehension  Verbalized understanding;Returned demonstration;Verbal cues required       PT Short Term Goals - 01/07/18 1213      PT SHORT TERM GOAL #1   Title  Patient will be independent in home exercise program to improve strength/mobility for better functional independence with ADLs.    Time  6    Period  Weeks    Status  New    Target Date  02/18/18      PT SHORT TERM GOAL #2   Title  nt (< 61 years old) will complete five times sit to stand test in < 10 seconds indicating an increased LE strength and improved balance.    Time  6    Period  Weeks    Status  New    Target Date  02/18/18        PT Smay Term Goals - 01/07/18 1214      PT Saks TERM GOAL #1   Title  Patient will increase six minute walk test distance to >1000 for progression to community ambulator and improve gait ability    Time  12    Period  Weeks    Status  New    Target Date  04/01/18      PT Boord TERM GOAL #2   Title  Patient will increase 10 meter walk test to >1.54m/s as to improve gait speed for better community ambulation and to reduce fall risk.    Time  12    Period  Weeks    Status  New    Target Date  04/01/18      PT Quilling TERM GOAL #3   Title  Patient will reduce  timed up and go to <11 seconds to reduce fall risk and demonstrate improved transfer/gait ability.    Time  12    Period  Weeks    Status  New    Target Date  04/01/18            Plan - 01/28/18 1357    Clinical Impression Statement  Pt requires direction and verbal cues for correct performance of exercises. Patient demonstrates weakness in BLE and performs open and closed chain exercises with minimal reports of pain to left knee. Pt was able to perform all exercises with max assist and VC for technique..   Patient struggles with speed during movement as well as balance with unstable surfaces.  Pt encouraged continuing new supine HEP .Follow-up as scheduled.    Rehab Potential  Good    PT Frequency  2x / week    PT Duration  12 weeks    PT Treatment/Interventions  Manual techniques;Neuromuscular re-education;Balance training;Therapeutic activities;Therapeutic exercise;Gait training;Moist Heat    PT Next Visit Plan  strengthening, balance training    Consulted and Agree with Plan of Care  Patient       Patient will benefit from skilled therapeutic intervention in order to improve the following deficits and impairments:  Abnormal gait, Decreased balance, Decreased endurance, Decreased mobility, Decreased activity tolerance, Decreased coordination, Decreased strength, Pain, Decreased knowledge of precautions  Visit Diagnosis: Muscle weakness (generalized)  Other lack of coordination  Difficulty in walking, not elsewhere classified     Problem List Patient Active Problem List   Diagnosis Date Noted  . Lower extremity edema 04/15/2017  . Macrocytic anemia 02/11/2017  . Vitamin D deficiency 02/02/2016  . Mixed hyperlipidemia 01/03/2015  . MS (multiple sclerosis) (HCC) 01/03/2015  . Depression  01/03/2015  . Chronic pain 01/03/2015  . GERD without esophagitis 01/03/2015  . COPD (chronic obstructive pulmonary disease) (HCC) 01/03/2015  . Osteopenia 01/03/2015  . Neurogenic  bladder 01/03/2015  . Incontinence in female 01/03/2015    Ezekiel Ina, PT DPT 01/28/2018, 2:01 PM   [facility] MAIN [facility] SERVICES [address] [place], [place], [address] Phone: [phone]   Fax:  [phone]  Name: NATASSIA HUMES MRN: [id number] Date of Birth: Aug 06, 1955

## 2018-01-28 NOTE — Therapy (Signed)
[facility] MAIN [facility] SERVICES [address] [place], [place], [address] Phone: [phone]   Fax:  [phone]  Occupational Therapy Treatment  Patient Details  Name: [name] MRN: [id number] Date of Birth: 02-Mar-1956 Referring Provider: Morene Crocker   Encounter Date: 01/28/2018  OT End of Session - 01/28/18 1325    Visit Number  21    Number of Visits  24    Date for OT Re-Evaluation  04/13/18    Authorization Type  Visit 2 of 10 for progress report period starting 01/21/2018    OT Start Time  1305    OT Stop Time  1345    OT Time Calculation (min)  40 min    Activity Tolerance  Patient tolerated treatment well    Behavior During Therapy  [facility] for tasks assessed/performed       Past Medical History:  Diagnosis Date  . Depression   . Headache   . Hyperlipidemia   . MS (multiple sclerosis) (HCC) 1990  . Neurogenic bladder   . Osteoporosis     Past Surgical History:  Procedure Laterality Date  . ABDOMINAL HYSTERECTOMY    . OOPHORECTOMY Bilateral     There were no vitals filed for this visit.  Subjective Assessment - 01/28/18 1313    Pertinent History  Patient reports she was diagnosed with MS years ago and has recently started to feel weak in her left dominant arm and hand.  She denies any previous therapy and reports memory issues and can provide a limited history.     Patient Stated Goals  Patient reports she wants to be able to walk better, use her left hand and be able to write again.     Currently in Pain?  Yes    Pain Score  7     Pain Location  Leg    Pain Orientation  Left    Pain Descriptors / Indicators  Aching    Pain Type  Chronic pain    Pain Onset  More than a month ago    Multiple Pain Sites  No           Patient seen for manipulation of ball pegs from tabletop to pick up and place into grid, dropping 5 of 30 completed.  Cues to manipulate and turn objects to place into board, patient removing  one at a time and using translatory movements of the hand to place items in palm, using hand for storage.   Patient seen finger strengthening with left hand to place push pins into moderate resistive board, cues for prehension patterns and to work to push pins in completely.  Removing pins with left hand with short rest breaks due to hand fatigue.    Handwriting with large red built up pen for name and address, patient is left hand dominant. Legibility remains fair to poor.  Multiple trials completed of her name and family members names.                OT Education - 01/28/18 1325    Education provided  Yes    Education Details  fine motor coordination, [employer]) Educated  Patient    Methods  Explanation;Demonstration;Verbal cues    Comprehension  Verbal cues required;Returned demonstration;Verbalized understanding    Person(s) Educated  Patient    Methods  Explanation;Demonstration    Comprehension  Verbalized understanding;Returned demonstration          OT Thornberry  Term Goals - 01/19/18 1401      OT Nile TERM GOAL #1   Title  Patient will demonstrate holding utensils in her left hand and feeding herself with modified independence with minimal spillage.      Baseline  01/19/2018: Pt. requires minA    Time  8    Period  Weeks    Status  On-going    Target Date  04/13/18      OT Platten TERM GOAL #2   Title  Patient wil complete tub/shower transfer with supervision only.    Baseline  min assist at eval     Time  8    Period  Weeks    Status  On-going    Target Date  04/13/18      OT Schumpert TERM GOAL #4   Title  Patient will increase left UE strength by 1 mm grade to be able to reach into closet to retrieve clothing items.     Baseline  Pt. conitnues to require assist from aide, 3+/5 strength at eval    Time  12    Period  Weeks    Status  On-going    Target Date  04/13/18      OT Butrick TERM GOAL #5   Title  Patient will improve coordination on the  left to be able to hold pen securely and write her name with 75% legibility.     Baseline  Pt. continues to present with decreased legibility with printing her name.     Time  12    Period  Weeks    Status  On-going    Target Date  04/13/18      OT Tillis TERM GOAL #6   Title  Patient will complete lower body dressing with modified independence using adaptive equipment if needed.     Baseline  Pt. continues to require minA able to get pants on, difficulty with tying shoes 6/19    Time  12    Period  Weeks    Status  On-going    Target Date  04/13/18            Plan - 01/28/18 1325    Clinical Impression Statement  Patient missed last session due to transportation issues, public transport did not pick her up as she was scheduled.  Patient continues to work towards left hand coordination and strength, mild to moderate difficulty with push pins on resisitive bulletin board.  Handwriting focus with adapted pen use and working towards smoother movements and legibility, currently legibility is fair to poor.  Continue to work towards left UE functional use for daily tasks with greater independence.     Occupational Profile and client history currently impacting functional performance  patient is legally blind, progressive disease process with MS for over 20+ years, lives alone, limited availability for transportation.      Occupational performance deficits (Please refer to evaluation for details):  ADL's;Leisure    Rehab Potential  Good    OT Frequency  2x / week    OT Duration  12 weeks    OT Treatment/Interventions  Therapeutic exercise;Cognitive remediation/compensation;Functional Mobility Training    Plan  Positive:  motivation, family support.  Negative:  lives alone, legally blind, requires caregiver 7 days a week for a few hours a day for self care    Consulted and Agree with Plan of Care  Patient       Patient will benefit from skilled therapeutic intervention in  order to improve  the following deficits and impairments:  Decreased cognition, Decreased knowledge of use of DME, Impaired vision/preception, Pain, Decreased coordination, Decreased activity tolerance, Decreased endurance, Decreased strength, Decreased balance, Impaired UE functional use  Visit Diagnosis: Muscle weakness (generalized)  Other lack of coordination    Problem List Patient Active Problem List   Diagnosis Date Noted  . Lower extremity edema 04/15/2017  . Macrocytic anemia 02/11/2017  . Vitamin D deficiency 02/02/2016  . Mixed hyperlipidemia 01/03/2015  . MS (multiple sclerosis) (HCC) 01/03/2015  . Depression 01/03/2015  . Chronic pain 01/03/2015  . GERD without esophagitis 01/03/2015  . COPD (chronic obstructive pulmonary disease) (HCC) 01/03/2015  . Osteopenia 01/03/2015  . Neurogenic bladder 01/03/2015  . Incontinence in female 01/03/2015   [name], OTR/L, CLT  Leonard Hendler 01/28/2018, 1:45 PM  [facility] MAIN [facility] SERVICES [address] [place], [place], [address] Phone: [phone]   Fax:  [phone]  Name: [name] MRN: [id number] Date of Birth: 1955-07-23

## 2018-02-02 ENCOUNTER — Encounter: Payer: Self-pay | Admitting: Physical Therapy

## 2018-02-02 ENCOUNTER — Ambulatory Visit: Payer: Medicare Other | Admitting: Physical Therapy

## 2018-02-02 DIAGNOSIS — M6281 Muscle weakness (generalized): Secondary | ICD-10-CM

## 2018-02-02 DIAGNOSIS — R262 Difficulty in walking, not elsewhere classified: Secondary | ICD-10-CM | POA: Diagnosis not present

## 2018-02-02 DIAGNOSIS — R278 Other lack of coordination: Secondary | ICD-10-CM

## 2018-02-02 NOTE — Therapy (Signed)
[facility] MAIN [facility] SERVICES [address] [place], [place], [address] Phone: [phone]   Fax:  [phone]  Physical Therapy Treatment  Patient Details  Name: [name] MRN: [id number] Date of Birth: 08/24/1955 Referring Provider: Morene Crocker   Encounter Date: 02/02/2018  PT End of Session - 02/02/18 1323    Visit Number  6    Number of Visits  25    Date for PT Re-Evaluation  04/01/18    PT Start Time  0103    PT Stop Time  0145    PT Time Calculation (min)  42 min    Equipment Utilized During Treatment  Gait belt    Activity Tolerance  Patient tolerated treatment well    Behavior During Therapy  [facility] for tasks assessed/performed       Past Medical History:  Diagnosis Date  . Depression   . Headache   . Hyperlipidemia   . MS (multiple sclerosis) (HCC) 1990  . Neurogenic bladder   . Osteoporosis     Past Surgical History:  Procedure Laterality Date  . ABDOMINAL HYSTERECTOMY    . OOPHORECTOMY Bilateral     There were no vitals filed for this visit.  Subjective Assessment - 02/02/18 1322    Subjective  Patient reports that she is doing ok today.Her leg is hurting her today.     Pertinent History  Patient reports about 3 months ago she started to feel weak in the left arm, reports she is legally blind.       How Charland can you stand comfortably?  not too Quesnel     Patient Stated Goals  Patient wants to be able to walk better.     Currently in Pain?  Yes    Pain Score  4     Pain Location  Leg    Pain Orientation  Left    Pain Descriptors / Indicators  Aching    Pain Type  Chronic pain    Pain Onset  More than a month ago       Treatment:  Therapeutic exercise; TM walking at 1.0 miles / hour x 5 mins  Step ups to 6 inch step x 20  Lateral step ups to 6 inch stool x 10 right and x 10 left  Hip extension standing  x 15 BLE Hip abd sidelying left and right x 15 BLE Bridges x 10 Standing Hip abduction x15  bilaterally; SLR x15 bilaterally hooklying marching x 15 x 2 Hooklying abd/ER x 15 with OTB  CGA and  mod verbal cues used throughout with decreased stability with motor planning . Continues to have balance deficits typical with diagnosis. Patient performs intermediate level exercises without pain behaviors and needs verbal cuing for postural alignment and head positioning                         PT Education - 02/02/18 1323    Education Details  HEP    Person(s) Educated  Patient    Methods  Explanation;Demonstration;Tactile cues    Comprehension  Verbalized understanding       PT Short Term Goals - 01/07/18 1213      PT SHORT TERM GOAL #1   Title  Patient will be independent in home exercise program to improve strength/mobility for better functional independence with ADLs.    Time  6    Period  Weeks    Status  New  Target Date  02/18/18      PT SHORT TERM GOAL #2   Title  nt (< 29 years old) will complete five times sit to stand test in < 10 seconds indicating an increased LE strength and improved balance.    Time  6    Period  Weeks    Status  New    Target Date  02/18/18        PT Fenley Term Goals - 01/07/18 1214      PT Mallen TERM GOAL #1   Title  Patient will increase six minute walk test distance to >1000 for progression to community ambulator and improve gait ability    Time  12    Period  Weeks    Status  New    Target Date  04/01/18      PT Romney TERM GOAL #2   Title  Patient will increase 10 meter walk test to >1.73m/s as to improve gait speed for better community ambulation and to reduce fall risk.    Time  12    Period  Weeks    Status  New    Target Date  04/01/18      PT Baetz TERM GOAL #3   Title  Patient will reduce timed up and go to <11 seconds to reduce fall risk and demonstrate improved transfer/gait ability.    Time  12    Period  Weeks    Status  New    Target Date  04/01/18            Plan - 02/02/18 1324     Clinical Impression Statement  Patient presents with decreased gait speed, decreased balance, and decreased BLE strength. Patient's main complaint is BLE weakness and inability to participate in desired activities. Patient wants to improve his balance and ability to ambulate on inclines and outdoor surfaces safely. Patient will benefit from skilled PT in order to increase gait speed, increase BLE strength, and improve dynamic standing balance to decrease risk for falls and enable patient to participate in desired activities.    Rehab Potential  Good    PT Frequency  2x / week    PT Duration  12 weeks    PT Treatment/Interventions  Manual techniques;Neuromuscular re-education;Balance training;Therapeutic activities;Therapeutic exercise;Gait training;Moist Heat    PT Next Visit Plan  strengthening, balance training    Consulted and Agree with Plan of Care  Patient       Patient will benefit from skilled therapeutic intervention in order to improve the following deficits and impairments:  Abnormal gait, Decreased balance, Decreased endurance, Decreased mobility, Decreased activity tolerance, Decreased coordination, Decreased strength, Pain, Decreased knowledge of precautions  Visit Diagnosis: Muscle weakness (generalized)  Other lack of coordination  Difficulty in walking, not elsewhere classified     Problem List Patient Active Problem List   Diagnosis Date Noted  . Lower extremity edema 04/15/2017  . Macrocytic anemia 02/11/2017  . Vitamin D deficiency 02/02/2016  . Mixed hyperlipidemia 01/03/2015  . MS (multiple sclerosis) (HCC) 01/03/2015  . Depression 01/03/2015  . Chronic pain 01/03/2015  . GERD without esophagitis 01/03/2015  . COPD (chronic obstructive pulmonary disease) (HCC) 01/03/2015  . Osteopenia 01/03/2015  . Neurogenic bladder 01/03/2015  . Incontinence in female 01/03/2015    Ezekiel Ina, PT DPT 02/02/2018, 1:26 PM   [facility] MAIN [facility] SERVICES [address] [place], [place], [address] Phone: [phone]   Fax:  [phone]  Name:  [name] MRN: [id number] Date of Birth: [birth year]

## 2018-02-04 ENCOUNTER — Ambulatory Visit: Payer: Medicare Other | Admitting: Occupational Therapy

## 2018-02-04 ENCOUNTER — Encounter: Payer: Self-pay | Admitting: Occupational Therapy

## 2018-02-04 ENCOUNTER — Ambulatory Visit: Payer: Medicare Other

## 2018-02-04 VITALS — BP 117/69 | HR 64

## 2018-02-04 DIAGNOSIS — M6281 Muscle weakness (generalized): Secondary | ICD-10-CM

## 2018-02-04 DIAGNOSIS — R278 Other lack of coordination: Secondary | ICD-10-CM | POA: Diagnosis not present

## 2018-02-04 DIAGNOSIS — R262 Difficulty in walking, not elsewhere classified: Secondary | ICD-10-CM | POA: Diagnosis not present

## 2018-02-04 NOTE — Therapy (Signed)
[facility] MAIN [facility] SERVICES [address] [place], [place], [address] Phone: [phone]   Fax:  [phone]  Occupational Therapy Treatment  Patient Details  Name: [name] MRN: [id number] Date of Birth: [birth year] Referring Provider: Morene Crocker   Encounter Date: 02/04/2018  OT End of Session - 02/04/18 1309    Visit Number  22    Number of Visits  24    Date for OT Re-Evaluation  04/13/18    Authorization Type  Visit 3 of 10 for progress report period starting 01/21/2018    OT Start Time  1300    OT Stop Time  1345    OT Time Calculation (min)  45 min    Equipment Utilized During Treatment  adaptive pens    Activity Tolerance  Patient tolerated treatment well    Behavior During Therapy  [facility] for tasks assessed/performed       Past Medical History:  Diagnosis Date  . Depression   . Headache   . Hyperlipidemia   . MS (multiple sclerosis) (HCC) 1990  . Neurogenic bladder   . Osteoporosis     Past Surgical History:  Procedure Laterality Date  . ABDOMINAL HYSTERECTOMY    . OOPHORECTOMY Bilateral     There were no vitals filed for this visit.  Subjective Assessment - 02/04/18 1308    Subjective   Pt. requires verbal cues to navigate through the gym.    Pertinent History  Patient reports she was diagnosed with MS years ago and has recently started to feel weak in her left dominant arm and hand.  She denies any previous therapy and reports memory issues and can provide a limited history.     Currently in Pain?  No/denies       OT TREATMENT    Neuro muscular re-education:  Pt. worked on grasping, flipping and stacking 2" large pegs on the Instructo board placed at a tabletop surface. Pt. worked on grasping 1" resistive cubes alternating thumb opposition to the tip of the 2nd through 5th digits while the board is placed at a vertical angle. Pt. worked on pressing the cubes back into place while alternating isolated 2nd  through 5th digit extension.Pt. Worked on following patterns to connect PCP pipe designs. Pt. Required step by step cues.                          OT Education - 02/04/18 1309    Education provided  Yes    Education Details  fine motor coordination, handwriting    Person(s) Educated  Patient    Methods  Explanation;Demonstration;Verbal cues    Comprehension  Verbal cues required;Returned demonstration;Verbalized understanding          OT Malcomb Term Goals - 01/19/18 1401      OT Millikan TERM GOAL #1   Title  Patient will demonstrate holding utensils in her left hand and feeding herself with modified independence with minimal spillage.      Baseline  01/19/2018: Pt. requires minA    Time  8    Period  Weeks    Status  On-going    Target Date  04/13/18      OT Suell TERM GOAL #2   Title  Patient wil complete tub/shower transfer with supervision only.    Baseline  min assist at eval     Time  8    Period  Weeks  Status  On-going    Target Date  04/13/18      OT Bula TERM GOAL #4   Title  Patient will increase left UE strength by 1 mm grade to be able to reach into closet to retrieve clothing items.     Baseline  Pt. conitnues to require assist from aide, 3+/5 strength at eval    Time  12    Period  Weeks    Status  On-going    Target Date  04/13/18      OT Gelinas TERM GOAL #5   Title  Patient will improve coordination on the left to be able to hold pen securely and write her name with 75% legibility.     Baseline  Pt. continues to present with decreased legibility with printing her name.     Time  12    Period  Weeks    Status  On-going    Target Date  04/13/18      OT Sturgeon TERM GOAL #6   Title  Patient will complete lower body dressing with modified independence using adaptive equipment if needed.     Baseline  Pt. continues to require minA able to get pants on, difficulty with tying shoes 6/19    Time  12    Period  Weeks    Status  On-going     Target Date  04/13/18            Plan - 02/04/18 1310    Clinical Impression Statement  Pt. reports that she feels like she is doing better overall. Pt. requires verbal cues to navigate throughout the gym. Pt. continues to present with impaired motor control, and coordination skills. Pt. required cues to navigate through following patterns using PCP pipe designs. Pt. continues to work on improving UE functioning for improved engagement in ADL, and IADL tasks.    Occupational Profile and client history currently impacting functional performance  patient is legally blind, progressive disease process with MS for over 20+ years, lives alone, limited availability for transportation.      Occupational performance deficits (Please refer to evaluation for details):  ADL's;Leisure    Rehab Potential  Good    OT Frequency  2x / week    OT Duration  12 weeks    OT Treatment/Interventions  Therapeutic exercise;Cognitive remediation/compensation;Functional Mobility Training    Plan  Positive:  motivation, family support.  Negative:  lives alone, legally blind, requires caregiver 7 days a week for a few hours a day for self care    Clinical Decision Making  Several treatment options, min-mod task modification necessary    Consulted and Agree with Plan of Care  Patient       Patient will benefit from skilled therapeutic intervention in order to improve the following deficits and impairments:  Decreased cognition, Decreased knowledge of use of DME, Impaired vision/preception, Pain, Decreased coordination, Decreased activity tolerance, Decreased endurance, Decreased strength, Decreased balance, Impaired UE functional use  Visit Diagnosis: Muscle weakness (generalized)  Other lack of coordination    Problem List Patient Active Problem List   Diagnosis Date Noted  . Lower extremity edema 04/15/2017  . Macrocytic anemia 02/11/2017  . Vitamin D deficiency 02/02/2016  . Mixed hyperlipidemia  01/03/2015  . MS (multiple sclerosis) (HCC) 01/03/2015  . Depression 01/03/2015  . Chronic pain 01/03/2015  . GERD without esophagitis 01/03/2015  . COPD (chronic obstructive pulmonary disease) (HCC) 01/03/2015  . Osteopenia 01/03/2015  . Neurogenic bladder 01/03/2015  .  Incontinence in female 01/03/2015    Olegario Messier 02/04/2018, 1:39 PM  [facility] MAIN [facility] SERVICES [address] [place], [place], [address] Phone: [phone]   Fax:  [phone]  Name: JASMIA SOUNG MRN: [id number] Date of Birth: 10/26/1955

## 2018-02-04 NOTE — Therapy (Signed)
[facility] MAIN [facility] SERVICES [address] [place], [place], [address] Phone: [phone]   Fax:  [phone]  Physical Therapy Treatment  Patient Details  Name: [name] MRN: [id number] Date of Birth: 03-15-1956 Referring Provider: Morene Crocker   Encounter Date: 02/04/2018  PT End of Session - 02/04/18 1353    Visit Number  7    Number of Visits  25    Date for PT Re-Evaluation  04/01/18    PT Start Time  1347    PT Stop Time  1430    PT Time Calculation (min)  43 min    Equipment Utilized During Treatment  Gait belt    Activity Tolerance  Patient tolerated treatment well    Behavior During Therapy  [facility] for tasks assessed/performed       Past Medical History:  Diagnosis Date  . Depression   . Headache   . Hyperlipidemia   . MS (multiple sclerosis) (HCC) 1990  . Neurogenic bladder   . Osteoporosis     Past Surgical History:  Procedure Laterality Date  . ABDOMINAL HYSTERECTOMY    . OOPHORECTOMY Bilateral     Vitals:   02/04/18 1351  BP: 117/69  Pulse: 64  SpO2: 100%    Subjective Assessment - 02/04/18 1351    Subjective  Patient reports that she is doing ok today. No specific questions or concerns. She denies pain upon arrival today. Pt reports compliance with her HEP    Pertinent History  Patient reports about 3 months ago she started to feel weak in the left arm, reports she is legally blind.       How Martel can you stand comfortably?  not too Ardito     Patient Stated Goals  Patient wants to be able to walk better.     Currently in Pain?  No/denies         TREATMENT  Ther-ex  NuStep L2/3 x 3 minutes for warm-up during history (1 min unbilled); Forward step-ups to 6 inch step x 10 leading with each LE, faded UE support; Side step-ups to 6 inch step x 10 each direction with faded UE support; 6" step tap with faded UE support alternating LE x 10 each; Standing hip flexion marches 2# ankle weights x 15  bilateral; Standing hip extension 2# ankle weights x 15 bilateral; Standing hip abduction 2# ankle weights x 15 bilateral; Standing HS curls 2# ankle weights x 15 bilateral; Standing mini squats in // bars 2 x 15; Seated clams with green tband 2 x 15; Seated adductor isometric ball squeeze 2s hold 2 x 15;                      PT Education - 02/04/18 1353    Education Details  exercise form/technique    Person(s) Educated  Patient    Methods  Explanation    Comprehension  Verbalized understanding       PT Short Term Goals - 01/07/18 1213      PT SHORT TERM GOAL #1   Title  Patient will be independent in home exercise program to improve strength/mobility for better functional independence with ADLs.    Time  6    Period  Weeks    Status  New    Target Date  02/18/18      PT SHORT TERM GOAL #2   Title  nt (< 3 years old) will complete five times sit  to stand test in < 10 seconds indicating an increased LE strength and improved balance.    Time  6    Period  Weeks    Status  New    Target Date  02/18/18        PT Nakayama Term Goals - 01/07/18 1214      PT Marken TERM GOAL #1   Title  Patient will increase six minute walk test distance to >1000 for progression to community ambulator and improve gait ability    Time  12    Period  Weeks    Status  New    Target Date  04/01/18      PT Gall TERM GOAL #2   Title  Patient will increase 10 meter walk test to >1.38m/s as to improve gait speed for better community ambulation and to reduce fall risk.    Time  12    Period  Weeks    Status  New    Target Date  04/01/18      PT Quebedeaux TERM GOAL #3   Title  Patient will reduce timed up and go to <11 seconds to reduce fall risk and demonstrate improved transfer/gait ability.    Time  12    Period  Weeks    Status  New    Target Date  04/01/18            Plan - 02/04/18 1353    Clinical Impression Statement  Pt demonstrates excellent motivation with  therapy. She is able to progress to no UE support with step-ups to 6" step with therapist. Minimal rest breaks required during session. Pt requires intermittent cues for proper exercise technique. Patient will benefit from skilled PT in order to increase gait speed, increase BLE strength, and improve dynamic standing balance to decrease risk for falls and enable patient to participate in desired activities    Rehab Potential  Good    PT Frequency  2x / week    PT Duration  12 weeks    PT Treatment/Interventions  Manual techniques;Neuromuscular re-education;Balance training;Therapeutic activities;Therapeutic exercise;Gait training;Moist Heat    PT Next Visit Plan  strengthening, balance training    Consulted and Agree with Plan of Care  Patient       Patient will benefit from skilled therapeutic intervention in order to improve the following deficits and impairments:  Abnormal gait, Decreased balance, Decreased endurance, Decreased mobility, Decreased activity tolerance, Decreased coordination, Decreased strength, Pain, Decreased knowledge of precautions  Visit Diagnosis: Muscle weakness (generalized)  Difficulty in walking, not elsewhere classified     Problem List Patient Active Problem List   Diagnosis Date Noted  . Lower extremity edema 04/15/2017  . Macrocytic anemia 02/11/2017  . Vitamin D deficiency 02/02/2016  . Mixed hyperlipidemia 01/03/2015  . MS (multiple sclerosis) (HCC) 01/03/2015  . Depression 01/03/2015  . Chronic pain 01/03/2015  . GERD without esophagitis 01/03/2015  . COPD (chronic obstructive pulmonary disease) (HCC) 01/03/2015  . Osteopenia 01/03/2015  . Neurogenic bladder 01/03/2015  . Incontinence in female 01/03/2015   Lynnea Maizes PT, DPT, GCS  Huprich,Jason 02/04/2018, 5:09 PM  [facility] MAIN [facility] SERVICES [address] [place], [place], [address] Phone: [phone]   Fax:  [phone]  Name: [name] MRN: [id number] Date of Birth: July 17, 1955

## 2018-02-09 ENCOUNTER — Ambulatory Visit: Payer: Medicare Other | Admitting: Occupational Therapy

## 2018-02-09 ENCOUNTER — Encounter: Payer: Self-pay | Admitting: Occupational Therapy

## 2018-02-09 ENCOUNTER — Ambulatory Visit: Payer: Medicare Other | Admitting: Physical Therapy

## 2018-02-09 ENCOUNTER — Encounter: Payer: Self-pay | Admitting: Physical Therapy

## 2018-02-09 DIAGNOSIS — M6281 Muscle weakness (generalized): Secondary | ICD-10-CM

## 2018-02-09 DIAGNOSIS — R278 Other lack of coordination: Secondary | ICD-10-CM | POA: Diagnosis not present

## 2018-02-09 DIAGNOSIS — R262 Difficulty in walking, not elsewhere classified: Secondary | ICD-10-CM | POA: Diagnosis not present

## 2018-02-09 NOTE — Therapy (Signed)
[facility] MAIN [facility] SERVICES [address] [place], [place], [address] Phone: [phone]   Fax:  [phone]  Occupational Therapy Treatment  Patient Details  Name: [name] MRN: [id number] Date of Birth: [birth year] Referring Provider: Morene Crocker   Encounter Date: 02/09/2018  OT End of Session - 02/09/18 1407    Visit Number  23    Number of Visits  24    Date for OT Re-Evaluation  04/13/18    Authorization Type  Visit 4 of 10 for progress report period starting 01/21/2018    OT Start Time  1400    OT Stop Time  1445    OT Time Calculation (min)  45 min    Activity Tolerance  Patient tolerated treatment well    Behavior During Therapy  [facility] for tasks assessed/performed       Past Medical History:  Diagnosis Date  . Depression   . Headache   . Hyperlipidemia   . MS (multiple sclerosis) (HCC) 1990  . Neurogenic bladder   . Osteoporosis     Past Surgical History:  Procedure Laterality Date  . ABDOMINAL HYSTERECTOMY    . OOPHORECTOMY Bilateral     There were no vitals filed for this visit.  Subjective Assessment - 02/09/18 1406    Pertinent History  Patient reports she was diagnosed with MS years ago and has recently started to feel weak in her left dominant arm and hand.  She denies any previous therapy and reports memory issues and can provide a limited history.     Patient Stated Goals  Patient reports she wants to be able to walk better, use her left hand and be able to write again.     Currently in Pain?  No/denies      OT TREATMENT    Neuro muscular re-education:  Pt. worked on grasping pegs on the [employer]. Pt. required verbal cues, step-by-step cues, and assist to follow the design pattern.  Pt. had increased difficulty following the designs, and increased cuing for accuracy with each color. Pt. worked with her left hand on grasping, flipping and stacking 2" large pegs on the Instructo  board placed at a tabletop surface. Pt. required cues to use her left hand.                         OT Education - 02/09/18 1406    Education provided  Yes    Education Details  fine motor coordination    Person(s) Educated  Patient    Methods  Explanation;Demonstration;Verbal cues    Comprehension  Verbal cues required;Returned demonstration;Verbalized understanding          OT Hulse Term Goals - 01/19/18 1401      OT Orosz TERM GOAL #1   Title  Patient will demonstrate holding utensils in her left hand and feeding herself with modified independence with minimal spillage.      Baseline  01/19/2018: Pt. requires minA    Time  8    Period  Weeks    Status  On-going    Target Date  04/13/18      OT Pellman TERM GOAL #2   Title  Patient wil complete tub/shower transfer with supervision only.    Baseline  min assist at eval     Time  8    Period  Weeks    Status  On-going    Target Date  04/13/18      OT Storrs TERM GOAL #4   Title  Patient will increase left UE strength by 1 mm grade to be able to reach into closet to retrieve clothing items.     Baseline  Pt. conitnues to require assist from aide, 3+/5 strength at eval    Time  12    Period  Weeks    Status  On-going    Target Date  04/13/18      OT Porro TERM GOAL #5   Title  Patient will improve coordination on the left to be able to hold pen securely and write her name with 75% legibility.     Baseline  Pt. continues to present with decreased legibility with printing her name.     Time  12    Period  Weeks    Status  On-going    Target Date  04/13/18      OT Thain TERM GOAL #6   Title  Patient will complete lower body dressing with modified independence using adaptive equipment if needed.     Baseline  Pt. continues to require minA able to get pants on, difficulty with tying shoes 6/19    Time  12    Period  Weeks    Status  On-going    Target Date  04/13/18            Plan - 02/09/18 1407     Clinical Impression Statement  Pt. continues to work on improving UE strength, and [facility] skills. Pt. requires frequent verbal cues, step-by-step instruction, and assist for following design patterns on the jumbo judy instructo board.    Occupational Profile and client history currently impacting functional performance  patient is legally blind, progressive disease process with MS for over 20+ years, lives alone, limited availability for transportation.      Occupational performance deficits (Please refer to evaluation for details):  ADL's;Leisure    Rehab Potential  Good    OT Frequency  2x / week    OT Duration  12 weeks    OT Treatment/Interventions  Therapeutic exercise;Cognitive remediation/compensation;Functional Mobility Training    Plan  Positive:  motivation, family support.  Negative:  lives alone, legally blind, requires caregiver 7 days a week for a few hours a day for self care    Clinical Decision Making  Several treatment options, min-mod task modification necessary    Consulted and Agree with Plan of Care  Patient       Patient will benefit from skilled therapeutic intervention in order to improve the following deficits and impairments:  Decreased cognition, Decreased knowledge of use of DME, Impaired vision/preception, Pain, Decreased coordination, Decreased activity tolerance, Decreased endurance, Decreased strength, Decreased balance, Impaired UE functional use  Visit Diagnosis: Muscle weakness (generalized)  Other lack of coordination    Problem List Patient Active Problem List   Diagnosis Date Noted  . Lower extremity edema 04/15/2017  . Macrocytic anemia 02/11/2017  . Vitamin D deficiency 02/02/2016  . Mixed hyperlipidemia 01/03/2015  . MS (multiple sclerosis) (HCC) 01/03/2015  . Depression 01/03/2015  . Chronic pain 01/03/2015  . GERD without esophagitis 01/03/2015  . COPD (chronic obstructive pulmonary disease) (HCC) 01/03/2015  . Osteopenia 01/03/2015  .  Neurogenic bladder 01/03/2015  . Incontinence in female 01/03/2015    Olegario Messier, MS, OTR/L 02/09/2018, 2:20 PM  [facility] MAIN [facility] SERVICES [address] [place], [place], [address] Phone: [phone]   Fax:  [phone]  Name: FLORNCE AERY MRN: [id number] Date of Birth: [birth year]

## 2018-02-09 NOTE — Therapy (Signed)
[facility] MAIN [facility] SERVICES [address] [place], [place], [address] Phone: [phone]   Fax:  [phone]  Physical Therapy Treatment  Patient Details  Name: [name] MRN: [id number] Date of Birth: [birth year] Referring Provider: Morene Crocker   Encounter Date: 02/09/2018  PT End of Session - 02/09/18 1457    Visit Number  8    Number of Visits  25    Date for PT Re-Evaluation  04/01/18    PT Start Time  0150    PT Stop Time  0200    PT Time Calculation (min)  10 min    Equipment Utilized During Treatment  Gait belt    Activity Tolerance  Patient tolerated treatment well    Behavior During Therapy  [facility] for tasks assessed/performed       Past Medical History:  Diagnosis Date  . Depression   . Headache   . Hyperlipidemia   . MS (multiple sclerosis) (HCC) 1990  . Neurogenic bladder   . Osteoporosis     Past Surgical History:  Procedure Laterality Date  . ABDOMINAL HYSTERECTOMY    . OOPHORECTOMY Bilateral     There were no vitals filed for this visit.  Subjective Assessment - 02/09/18 1456    Subjective  Patient reports that she is doing ok today. No specific questions or concerns. She denies pain upon arrival today. Pt reports compliance with her HEP    Pertinent History  Patient reports about 3 months ago she started to feel weak in the left arm, reports she is legally blind.       How Sikorski can you stand comfortably?  not too Waldron     Patient Stated Goals  Patient wants to be able to walk better.     Currently in Pain?  No/denies    Pain Score  0-No pain    Pain Onset  More than a month ago       Treatment:  Step ups to 6 inch stool x 20   Side stepping from 1/2 foam to stepping stones x 10 with min assist   CGA for balance, 50% cues for correct technique and form                         PT Education - 02/09/18 1457    Education Details  HEP    Person(s) Educated  Patient    Methods  Explanation;Demonstration;Tactile cues    Comprehension  Verbalized understanding;Returned demonstration       PT Short Term Goals - 01/07/18 1213      PT SHORT TERM GOAL #1   Title  Patient will be independent in home exercise program to improve strength/mobility for better functional independence with ADLs.    Time  6    Period  Weeks    Status  New    Target Date  02/18/18      PT SHORT TERM GOAL #2   Title  nt (< 5 years old) will complete five times sit to stand test in < 10 seconds indicating an increased LE strength and improved balance.    Time  6    Period  Weeks    Status  New    Target Date  02/18/18        PT Haworth Term Goals - 01/07/18 1214      PT Speagle TERM GOAL #1   Title  Patient will increase six  minute walk test distance to >1000 for progression to community ambulator and improve gait ability    Time  12    Period  Weeks    Status  New    Target Date  04/01/18      PT Spraker TERM GOAL #2   Title  Patient will increase 10 meter walk test to >1.63m/s as to improve gait speed for better community ambulation and to reduce fall risk.    Time  12    Period  Weeks    Status  New    Target Date  04/01/18      PT Poer TERM GOAL #3   Title  Patient will reduce timed up and go to <11 seconds to reduce fall risk and demonstrate improved transfer/gait ability.    Time  12    Period  Weeks    Status  New    Target Date  04/01/18            Plan - 02/09/18 1458    Clinical Impression Statement  Patient demonstrates deficits with postural control in tandem and narrow stance on purple foam . Patient demonstrated hesitation with full weight shifting and on uneven surfaces but minimal cueing resulted in good technique and no LOB.  Patient improved ability to challenge dynamic balance with supervision and with UE assist today.   Patient will continue to benefit from skilled physical therapy to improve endurance and dynamic balance to reduce fall risk     Rehab Potential  Good    PT Frequency  2x / week    PT Duration  8 weeks    PT Treatment/Interventions  Gait training;Therapeutic exercise;Therapeutic activities;Functional mobility training;Neuromuscular re-education;Patient/family education;Balance training    PT Home Exercise Plan  HEP    Consulted and Agree with Plan of Care  Patient       Patient will benefit from skilled therapeutic intervention in order to improve the following deficits and impairments:  Abnormal gait, Decreased balance, Decreased endurance, Postural dysfunction, Impaired flexibility, Decreased strength, Decreased safety awareness, Decreased activity tolerance, Decreased range of motion, Decreased knowledge of precautions, Impaired sensation, Difficulty walking  Visit Diagnosis: Difficulty in walking, not elsewhere classified  Muscle weakness (generalized)  Other lack of coordination     Problem List Patient Active Problem List   Diagnosis Date Noted  . Lower extremity edema 04/15/2017  . Macrocytic anemia 02/11/2017  . Vitamin D deficiency 02/02/2016  . Mixed hyperlipidemia 01/03/2015  . MS (multiple sclerosis) (HCC) 01/03/2015  . Depression 01/03/2015  . Chronic pain 01/03/2015  . GERD without esophagitis 01/03/2015  . COPD (chronic obstructive pulmonary disease) (HCC) 01/03/2015  . Osteopenia 01/03/2015  . Neurogenic bladder 01/03/2015  . Incontinence in female 01/03/2015    Ezekiel Ina, PT DPT 02/09/2018, 3:15 PM  [facility] MAIN [facility] SERVICES [address] [place], [place], [address] Phone: [phone]   Fax:  [phone]  Name: FLOELLA BROMAN MRN: [id number] Date of Birth: October 17, 1955

## 2018-02-10 DIAGNOSIS — H2513 Age-related nuclear cataract, bilateral: Secondary | ICD-10-CM | POA: Diagnosis not present

## 2018-02-10 DIAGNOSIS — G379 Demyelinating disease of central nervous system, unspecified: Secondary | ICD-10-CM | POA: Diagnosis not present

## 2018-02-11 ENCOUNTER — Encounter: Payer: Self-pay | Admitting: Physical Therapy

## 2018-02-11 ENCOUNTER — Encounter: Payer: Self-pay | Admitting: Occupational Therapy

## 2018-02-11 ENCOUNTER — Ambulatory Visit: Payer: Medicare Other | Admitting: Occupational Therapy

## 2018-02-11 ENCOUNTER — Ambulatory Visit: Payer: Medicare Other | Admitting: Physical Therapy

## 2018-02-11 DIAGNOSIS — R262 Difficulty in walking, not elsewhere classified: Secondary | ICD-10-CM | POA: Diagnosis not present

## 2018-02-11 DIAGNOSIS — R278 Other lack of coordination: Secondary | ICD-10-CM

## 2018-02-11 DIAGNOSIS — M6281 Muscle weakness (generalized): Secondary | ICD-10-CM | POA: Diagnosis not present

## 2018-02-11 NOTE — Therapy (Signed)
[facility] MAIN [facility] SERVICES [address] [place], [place], [address] Phone: [phone]   Fax:  [phone]  Physical Therapy Treatment/  Physical Therapy Progress Note   Dates of reporting period  01/07/18   to   02/11/18  Patient Details  Name: [name] MRN: [id number] Date of Birth: [birth year] Referring Provider: Anabel Bene   Encounter Date: 02/11/2018  PT End of Session - 02/11/18 1502    Visit Number  8    Number of Visits  25    Date for PT Re-Evaluation  04/01/18    PT Start Time  0315    PT Stop Time  0400    PT Time Calculation (min)  45 min    Equipment Utilized During Treatment  Gait belt    Activity Tolerance  Patient tolerated treatment well    Behavior During Therapy  WFL for tasks assessed/performed       Past Medical History:  Diagnosis Date  . Depression   . Headache   . Hyperlipidemia   . MS (multiple sclerosis) ([place]) 1990  . Neurogenic bladder   . Osteoporosis     Past Surgical History:  Procedure Laterality Date  . ABDOMINAL HYSTERECTOMY    . OOPHORECTOMY Bilateral     There were no vitals filed for this visit.  Subjective Assessment - 02/11/18 1501    Subjective  Patient reports that she is doing ok today. No specific questions or concerns. She denies pain upon arrival today. Pt reports compliance with her HEP    Pertinent History  Patient reports about 3 months ago she started to feel weak in the left arm, reports she is legally blind.       How Carrozza can you stand comfortably?  not too Hillis     Patient Stated Goals  Patient wants to be able to walk better.     Currently in Pain?  No/denies    Pain Score  0-No pain    Pain Onset  More than a month ago       Treatment: Gait training with loftstrand crutch fwd. Bwd/side to side x 200 feet each way with CGA and rest periods   Leg press 90 lbs x 20 x 3  Outcome measures performed : 6 MW test, 10 MW, TUG, 5 x sit to stand , with progress  made towards all goals.                         PT Education - 02/11/18 1501    Education Details  HEP    Person(s) Educated  Patient    Methods  Explanation;Demonstration    Comprehension  Verbalized understanding;Returned demonstration       PT Short Term Goals - 02/11/18 1602      PT SHORT TERM GOAL #1   Title  Patient will be independent in home exercise program to improve strength/mobility for better functional independence with ADLs.    Time  6    Period  Weeks    Status  On-going    Target Date  02/18/18      PT SHORT TERM GOAL #2   Title  nt (< 37 years old) will complete five times sit to stand test in < 10 seconds indicating an increased LE strength and improved balance.    Baseline  02/11/18= 19.38    Time  6    Period  Weeks  Status  On-going    Target Date  02/18/18        PT Birden Term Goals - 02/11/18 1546      PT Kallio TERM GOAL #1   Title  Patient will increase six minute walk test distance to >1000 for progression to community ambulator and improve gait ability    Baseline  02/11/18= 500 feet     Time  12    Period  Weeks    Status  New    Target Date  04/01/18      PT Mohon TERM GOAL #2   Title  Patient will increase 10 meter walk test to >1.76ms as to improve gait speed for better community ambulation and to reduce fall risk.    Baseline  02/11/18 . 68 m/sec    Time  12    Period  Weeks    Status  New    Target Date  04/01/18      PT Klapper TERM GOAL #3   Title  Patient will reduce timed up and go to <11 seconds to reduce fall risk and demonstrate improved transfer/gait ability.    Baseline  17.49 sec 02/11/18    Time  12    Period  Weeks    Status  Partially Met    Target Date  04/01/18      PT Malkin TERM GOAL #4   Title  Patient (> 7104years old) will complete five times sit to stand test in < 15 seconds indicating an increased LE strength and improved balance.    Baseline  19.38sec 02/11/18    Time  12    Period  Weeks     Status  New    Target Date  04/01/18            Plan - 02/11/18 1545    Clinical Impression Statement  Patient's condition has the potential to improve in response to therapy. Maximum improvement is yet to be obtained. The anticipated improvement is attainable and reasonable in a generally predictable time. Start date of reporting period 01/07/18 end date of reporting period 02/11/18.  Patient reports that she is able to ambulate with better control.  Pt demonstrates excellent motivation with therapy. She is able to progress to no UE support with step-ups to 6" step with therapist. Minimal rest breaks required during session. Pt requires intermittent cues for proper exercise technique. Patient will benefit from skilled PT in order to increase gait speed, increase BLE strength, and improve dynamic standing balance to decrease risk for falls and enable patient to participate in desired activities    Rehab Potential  Good    PT Frequency  2x / week    PT Duration  8 weeks    PT Treatment/Interventions  Gait training;Therapeutic exercise;Therapeutic activities;Functional mobility training;Neuromuscular re-education;Patient/family education;Balance training    PT Home Exercise Plan  HEP    Consulted and Agree with Plan of Care  Patient       Patient will benefit from skilled therapeutic intervention in order to improve the following deficits and impairments:  Abnormal gait, Decreased balance, Decreased endurance, Postural dysfunction, Impaired flexibility, Decreased strength, Decreased safety awareness, Decreased activity tolerance, Decreased range of motion, Decreased knowledge of precautions, Impaired sensation, Difficulty walking  Visit Diagnosis: Muscle weakness (generalized)  Other lack of coordination  Difficulty in walking, not elsewhere classified     Problem List Patient Active Problem List   Diagnosis Date Noted  . Lower extremity edema 04/15/2017  .  Macrocytic anemia  02/11/2017  . Vitamin D deficiency 02/02/2016  . Mixed hyperlipidemia 01/03/2015  . MS (multiple sclerosis) ([facility]) 01/03/2015  . Depression 01/03/2015  . Chronic pain 01/03/2015  . GERD without esophagitis 01/03/2015  . COPD (chronic obstructive pulmonary disease) ([facility] [place]) 01/03/2015  . Osteopenia 01/03/2015  . Neurogenic bladder 01/03/2015  . Incontinence in female 01/03/2015    Alanson Puls, PT DPT 02/11/2018, 4:12 PM  [place] MAIN [facility] SERVICES [address] [place], [place], [address] Phone: [phone]   Fax:  [phone]  Name: [name] MRN: [id number] Date of Birth: July 19, 1955

## 2018-02-11 NOTE — Therapy (Signed)
[facility] MAIN [facility] SERVICES [address] [place], [place], [address] Phone: [phone]   Fax:  [phone]  Occupational Therapy Treatment  Patient Details  Name: [name] MRN: [id number] Date of Birth: 06/03/1956 Referring Provider: Morene Crocker   Encounter Date: 02/11/2018  OT End of Session - 02/11/18 1442    Visit Number  24    Number of Visits  48    Date for OT Re-Evaluation  04/13/18    Authorization Type  Visit 5 of 10 for progress report period starting 01/21/2018    OT Start Time  1430    OT Stop Time  1515    OT Time Calculation (min)  45 min    Activity Tolerance  Patient tolerated treatment well    Behavior During Therapy  [facility] for tasks assessed/performed       Past Medical History:  Diagnosis Date  . Depression   . Headache   . Hyperlipidemia   . MS (multiple sclerosis) ([facility]) 1990  . Neurogenic bladder   . Osteoporosis     Past Surgical History:  Procedure Laterality Date  . ABDOMINAL HYSTERECTOMY    . OOPHORECTOMY Bilateral     There were no vitals filed for this visit.  Subjective Assessment - 02/11/18 1439    Subjective   Pt. requires verbal cues to navigate through the gym.    Pertinent History  Patient reports she was diagnosed with MS years ago and has recently started to feel weak in her left dominant arm and hand.  She denies any previous therapy and reports memory issues and can provide a limited history.     Patient Stated Goals  Patient reports she wants to be able to walk better, use her left hand and be able to write again.     Currently in Pain?  No/denies      OT TREATMENT    Neuro muscular re-education:  Pt. worked on left hand grasping small magnetic resistive pegs, and pacing them on a vertical white board. Pt. worked on sorting the pegs by color. Pt. required cognitive cues when sorting the pegs by color. Pt. worked on bilateral [facility] skills needed to grasp small resistive beads.  Pt. worked on connecting the beads using a 3pt. pinch, and pt. pinch grasp. Pt. worked on disconnecting the resistive beads using a lateral pinch grasp, and 3pt. pinch grasp. Pt. requires had difficulty maintaining her grasp on the smooth circular objects.                          OT Education - 02/11/18 1440    Education provided  Yes    Education Details  fine motor coordination    Person(s) Educated  Patient    Methods  Explanation;Demonstration;Verbal cues    Comprehension  Verbal cues required;Returned demonstration;Verbalized understanding          OT Salomone Term Goals - 01/19/18 1401      OT Kellis TERM GOAL #1   Title  Patient will demonstrate holding utensils in her left hand and feeding herself with modified independence with minimal spillage.      Baseline  01/19/2018: Pt. requires minA    Time  8    Period  Weeks    Status  On-going    Target Date  04/13/18      OT Lawless TERM GOAL #2   Title  Patient wil complete tub/shower transfer with  supervision only.    Baseline  min assist at eval     Time  8    Period  Weeks    Status  On-going    Target Date  04/13/18      OT Way[place] TERM GOAL #4   Title  Patient will increase left UE strength by 1 mm grade to be able to reach into closet to retrieve clothing items.     Baseline  Pt. conitnues to require assist from aide, 3+/5 strength at eval    Time  12    Period  Weeks    Status  On-going    Target Date  04/13/18      OT Myron TERM GOAL #5   Title  Patient will improve coordination on the left to be able to hold pen securely and write her name with 75% legibility.     Baseline  Pt. continues to present with decreased legibility with printing her name.     Time  12    Period  Weeks    Status  On-going    Target Date  04/13/18      OT Mcmurtry TERM GOAL #6   Title  Patient will complete lower body dressing with modified independence using adaptive equipment if needed.     Baseline  Pt. continues to  require minA able to get pants on, difficulty with tying shoes 6/19    Time  12    Period  Weeks    Status  On-going    Target Date  04/13/18            Plan - 02/11/18 1443    Clinical Impression Statement Pt. continues to work on improving motor control, and bilateral hand [facility] skills. Pt. continues to work on improving [facility] and prehension skills manipulating small objects while adding a cognitive component to the task.  Pt. requires cognitive cues for sorting the pegs by color.   Occupational Profile and client history currently impacting functional performance Patient is legally blind, progressive disease process with MS for over 20+ years, lives alone, limited availability for transportation.      Occupational performance deficits (Please refer to evaluation for details):  ADL's;Leisure    Rehab Potential  Good    OT Frequency  2x / week    OT Duration  12 weeks    OT Treatment/Interventions  Therapeutic exercise;Cognitive remediation/compensation;Functional Mobility Training    Plan  Positive:  motivation, family support.  Negative:  lives alone, legally blind, requires caregiver 7 days a week for a few hours a day for self care    Clinical Decision Making  Several treatment options, min-mod task modification necessary    Consulted and Agree with Plan of Care  Patient       Patient will benefit from skilled therapeutic intervention in order to improve the following deficits and impairments:  Decreased cognition, Decreased knowledge of use of DME, Impaired vision/preception, Pain, Decreased coordination, Decreased activity tolerance, Decreased endurance, Decreased strength, Decreased balance, Impaired UE functional use  Visit Diagnosis: Muscle weakness (generalized)  Other lack of coordination    Problem List Patient Active Problem List   Diagnosis Date Noted  . Lower extremity edema 04/15/2017  . Macrocytic anemia 02/11/2017  . Vitamin D deficiency 02/02/2016  . Mixed  hyperlipidemia 01/03/2015  . MS (multiple sclerosis) ([facility]) 01/03/2015  . Depression 01/03/2015  . Chronic pain 01/03/2015  . GERD without esophagitis 01/03/2015  . COPD (chronic obstructive pulmonary disease) ([facility]) 01/03/2015  .  Osteopenia 01/03/2015  . Neurogenic bladder 01/03/2015  . Incontinence in female 01/03/2015    Olegario Messier, MS, OTR/L 02/11/2018, 2:56 PM  [facility] MAIN [facility] SERVICES [address] [place], [place], [address] Phone: [phone]   Fax:  [phone]  Name: [name] MRN: [id number] Date of Birth: 07/01/1955

## 2018-02-12 ENCOUNTER — Ambulatory Visit (INDEPENDENT_AMBULATORY_CARE_PROVIDER_SITE_OTHER): Payer: Medicare Other | Admitting: Family Medicine

## 2018-02-12 ENCOUNTER — Encounter: Payer: Self-pay | Admitting: Family Medicine

## 2018-02-12 VITALS — BP 100/68 | HR 73 | Temp 98.7°F | Resp 16 | Ht 64.0 in | Wt 146.0 lb

## 2018-02-12 DIAGNOSIS — Z1231 Encounter for screening mammogram for malignant neoplasm of breast: Secondary | ICD-10-CM

## 2018-02-12 DIAGNOSIS — M8589 Other specified disorders of bone density and structure, multiple sites: Secondary | ICD-10-CM | POA: Diagnosis not present

## 2018-02-12 DIAGNOSIS — G35 Multiple sclerosis: Secondary | ICD-10-CM | POA: Diagnosis not present

## 2018-02-12 DIAGNOSIS — G894 Chronic pain syndrome: Secondary | ICD-10-CM | POA: Diagnosis not present

## 2018-02-12 DIAGNOSIS — F325 Major depressive disorder, single episode, in full remission: Secondary | ICD-10-CM

## 2018-02-12 DIAGNOSIS — N319 Neuromuscular dysfunction of bladder, unspecified: Secondary | ICD-10-CM | POA: Diagnosis not present

## 2018-02-12 DIAGNOSIS — Z79899 Other long term (current) drug therapy: Secondary | ICD-10-CM | POA: Diagnosis not present

## 2018-02-12 DIAGNOSIS — Z1239 Encounter for other screening for malignant neoplasm of breast: Secondary | ICD-10-CM

## 2018-02-12 DIAGNOSIS — E782 Mixed hyperlipidemia: Secondary | ICD-10-CM

## 2018-02-12 DIAGNOSIS — R32 Unspecified urinary incontinence: Secondary | ICD-10-CM

## 2018-02-12 DIAGNOSIS — K219 Gastro-esophageal reflux disease without esophagitis: Secondary | ICD-10-CM | POA: Diagnosis not present

## 2018-02-12 DIAGNOSIS — J432 Centrilobular emphysema: Secondary | ICD-10-CM

## 2018-02-12 DIAGNOSIS — E559 Vitamin D deficiency, unspecified: Secondary | ICD-10-CM

## 2018-02-12 MED ORDER — GABAPENTIN 300 MG PO CAPS: 600.0000 mg | ORAL_CAPSULE | Freq: Three times a day (TID) | ORAL | 3 refills | Status: DC

## 2018-02-12 MED ORDER — ALENDRONATE SODIUM 70 MG PO TABS: 70.0000 mg | ORAL_TABLET | ORAL | 3 refills | Status: DC

## 2018-02-12 MED ORDER — MIRABEGRON ER 50 MG PO TB24: 50.0000 mg | ORAL_TABLET | Freq: Every day | ORAL | 3 refills | Status: DC

## 2018-02-12 MED ORDER — PANTOPRAZOLE SODIUM 40 MG PO TBEC: 40.0000 mg | DELAYED_RELEASE_TABLET | Freq: Every day | ORAL | 3 refills | Status: DC

## 2018-02-12 NOTE — Assessment & Plan Note (Addendum)
Chronic relapsing remitting MS. Currently stable without flare Complicated with neurogenic bladder, see A&P Followed by [facility] Dr Malvin Johns Continue current MS targeted therapy per med list Managed with outpatient PT - improving Pain management on Tramadol, Gabapentin Refill Gabapentin today

## 2018-02-12 NOTE — Assessment & Plan Note (Signed)
Resolved, in remission Prior history, now no concerns anymore, not on treatment for >1+ years Not followed by Psychiatry No other complicating factors - except MS

## 2018-02-12 NOTE — Progress Notes (Signed)
Subjective:    Patient ID: [name], female    DOB: 11-23-1955, 62 y.o.   MRN: [id number]  [name] is a 62 y.o. female presenting on 02/12/2018 for Hyperlipidemia; Multiple Sclerosis; and Urinary Retention  Patient accompanied by her primary caregiver, provides additional history today.  HPI   Multiple Sclerosis, Chronic / Neurogenic Bladder / Chronic Pain Syndrome - Chronic problem followed by [facility] Neurology (Dr Gurney Maxin), managed on Tramadol PRN for pain control with MS, usually takes at night. - Interval update, she has been doing well on outpatient PT regimen with significant improvements - Today doing well. She is still followed closely by Neurology. - Continues to use assistance with ambulation. Has history of foot drop in past improved. - Continues on MS medication management - Has issues with neurogenic bladder from MS, still issues with some overflow and urinary incontinence, she has urge to go, now more frequently, improved on Myrbetriq 32m daily since past >1 year, has not seen Urologist, now using depends more frequently but still doing well. Request record on depends medical supply for her urinary incontinence that is needed to help keep her dry and avoid recurrent UTI. She is doing better with hygiene and cleaning after soiling. - Not taking Tylenol - Not taking any NSAID - Gabapentin 302mx 2 for dose 60042mID, request refill Admits urinary frequency Admits chronic L sided weakness, some episodes of intermittent swelling seem related to inactivity, improve with rest and elevation Denies dysuria, fever chills, hematuria  COPD - Currently stable without recent problem or exacerbation. - Continues on Spiriva daily with good results. Rarely using albuterol inhaler or nebulizer only PRN flare - Has not had night awakening or used Prednisone recently.  Major Depression, Chronic recurrent - In complete remission Prior history of mood with  depression, used to be bigger issue for her, now she has done well overall and has no new concerns. She thinks her mood is "normal" now at baseline, and she is not on anti depressant. She does not think she is "depressed" anymore, see answers to PHQ[place]itamin D Deficiency / Osteopenia  Due for repeat lab testing Vitamin D Last imaging DEXA 12/2014, T-1.6 Taking Fosamax 62m60mekly tolerating well  GERD Well controlled on current PPI Pantoprazole 40mg65mly, request refill Denies abdominal pain, nausea vomiting regurgitation, heartburn  Health Maintenance: Due for Flu Vaccine, will return when in stock.  Due for Mammogram - order requested, she will call to schedule.  Dep[facility] 05/29/2015  Decreased Interest 0 0  Down, Depressed, Hopeless 0 0  PHQ - 2 Score 0 0  Altered sleeping 0 -  Tired, decreased energy 0 -  Change in appetite 0 -  Feeling bad or failure about yourself  0 -  Trouble concentrating 1 -  Moving slowly or fidgety/restless 1 -  Suicidal thoughts 0 -  PHQ-9 Score 2 -  Difficult doing work/chores Not difficult at all -    Social History   Tobacco Use  . Smoking status: Former Smoker    Packs/day: 1.00    Years: 20.00    Pack years: 20.00    Last attempt to quit: 01/03/1995    Years since quitting: 23.1  . Smokeless tobacco: Never Used  Substance Use Topics  . Alcohol use: No    Alcohol/week: 0.0 standard drinks  . Drug use: Yes    Comment: smokes pods in past    Review of Systems Per  HPI unless specifically indicated above     Objective:    BP 100/68   Pulse 73   Temp 98.7 F (37.1 C) (Oral)   Resp 16   Ht '5\' 4"'  (1.626 m)   Wt 146 lb (66.2 kg)   BMI 25.06 kg/m   Wt Readings from Last 3 Encounters:  02/12/18 146 lb (66.2 kg)  04/01/17 143 lb 9.6 oz (65.1 kg)  02/25/17 142 lb 12.8 oz (64.8 kg)    Physical Exam  Constitutional: She is oriented to person, place, and time. She appears well-developed and well-nourished.  No distress.  Chronically ill-appearing but currently well today, mostly comfortable, cooperative.  HENT:  Head: Normocephalic and atraumatic.  Mouth/Throat: Oropharynx is clear and moist.  Eyes: Conjunctivae are normal. Right eye exhibits no discharge. Left eye exhibits no discharge.  Neck: Normal range of motion. Neck supple.  Cardiovascular: Normal rate, regular rhythm, normal heart sounds and intact distal pulses.  No murmur heard. Pulmonary/Chest: Effort normal and breath sounds normal. No respiratory distress. She has no wheezes. She has no rales.  Abdominal: Soft. Bowel sounds are normal. She exhibits no distension. There is no tenderness.  Musculoskeletal: She exhibits no edema.  Low Back / Lower Extremity Inspection: Normal appearance, no spinal deformity, symmetrical. Palpation: No tenderness over spinous processes. Stable bilateral lower back muscle spasm and hypertonicity ROM: Improved and intact lower extremity range of motion able to flex/ext both legs and hips well today. Strength: Bilateral knee flex/ext 5/5. Some chronic residual weakness 4/5 Left hip/knee/ankle Neurovascular: intact distal sensation to light touch  Neurological: She is alert and oriented to person, place, and time.  Skin: Skin is warm and dry. No rash noted. She is not diaphoretic. No erythema.  Psychiatric: She has a normal mood and affect. Her behavior is normal.  Nursing note and vitals reviewed.  Results for orders placed or performed in visit on 02/12/17  COMPLETE METABOLIC PANEL WITH GFR  Result Value Ref Range   Sodium 141 135 - 146 mmol/L   Potassium 3.8 3.5 - 5.3 mmol/L   Chloride 103 98 - 110 mmol/L   CO2 25 20 - 32 mmol/L   Glucose, Bld 70 65 - 99 mg/dL   BUN 12 7 - 25 mg/dL   Creat 0.49 (L) 0.50 - 0.99 mg/dL   Total Bilirubin 0.4 0.2 - 1.2 mg/dL   Alkaline Phosphatase 71 33 - 130 U/L   AST 14 10 - 35 U/L   ALT 10 6 - 29 U/L   Total Protein 7.0 6.1 - 8.1 g/dL   Albumin 4.4 3.6 - 5.1  g/dL   Calcium 9.2 8.6 - 10.4 mg/dL   GFR, Est African American >89 >=60 mL/min   GFR, Est Non African American >89 >=60 mL/min  CBC with Differential/Platelet  Result Value Ref Range   WBC 4.6 3.8 - 10.8 K/uL   RBC 4.24 3.80 - 5.10 MIL/uL   Hemoglobin 14.4 11.7 - 15.5 g/dL   HCT 43.4 35.0 - 45.0 %   MCV 102.4 (H) 80.0 - 100.0 fL   MCH 34.0 (H) 27.0 - 33.0 pg   MCHC 33.2 32.0 - 36.0 g/dL   RDW 13.7 11.0 - 15.0 %   Platelets 194 140 - 400 K/uL   MPV 10.4 7.5 - 12.5 fL   Neutro Abs 3,358 1,500 - 7,800 cells/uL   Lymphs Abs 782 (L) 850 - 3,900 cells/uL   Monocytes Absolute 414 200 - 950 cells/uL   Eosinophils Absolute 46  15 - 500 cells/uL   Basophils Absolute 0 0 - 200 cells/uL   Neutrophils Relative % 73 %   Lymphocytes Relative 17 %   Monocytes Relative 9 %   Eosinophils Relative 1 %   Basophils Relative 0 %   Smear Review Criteria for review not met   Hemoglobin A1c  Result Value Ref Range   Hgb A1c MFr Bld 5.1 <5.7 %   Mean Plasma Glucose 100 mg/dL  Lipid panel  Result Value Ref Range   Cholesterol 173 <200 mg/dL   Triglycerides 46 <150 mg/dL   HDL 74 >50 mg/dL   Total CHOL/HDL Ratio 2.3 <5.0 Ratio   VLDL 9 <30 mg/dL   LDL Cholesterol 90 <100 mg/dL  VITAMIN D 25 Hydroxy (Vit-D Deficiency, Fractures)  Result Value Ref Range   Vit D, 25-Hydroxy 27 (L) 30 - 100 ng/mL  Hepatitis C antibody  Result Value Ref Range   HCV Ab NON-REACTIVE NON-REACTIVE  HIV antibody  Result Value Ref Range   HIV 1&2 Ab, 4th Generation NONREACTIVE NONREACTIVE  Vitamin B12  Result Value Ref Range   Vitamin B-12 535 200 - 1,100 pg/mL  Folate  Result Value Ref Range   Folate 11.4 >5.4 ng/mL      Assessment & Plan:   Problem List Items Addressed This Visit    Chronic pain    Stable chronic problem secondary to MS [facility] Checked [place] CSRS for past 2 years, appropriate Tramadol rx Managed on Tramadol, Gabapentin, Tylenol Refill Gabapentin      Relevant Medications     gabapentin (NEURONTIN) 300 MG capsule   COPD (chronic obstructive pulmonary disease) (HCC)    Stable without exacerbation Improved on Spiriva Continue Albuterol PRN      GERD without esophagitis    Stable Continues on PPI - refill today      Relevant Medications   pantoprazole (PROTONIX) 40 MG tablet   Incontinence in female    Secondary to neurogenic bladder / overflow incontinence mixed - due to Multiple Sclerosis See A&P - Indicated for incontinence supplies, pull-ups, continue to use regularly to prevent future UTI risk that would come with soiling. Will continue to treat bladder with Myrbetriq, now inc dose - Future may need Urology      Relevant Medications   mirabegron ER (MYRBETRIQ) 50 MG TB24 tablet   RESOLVED: Major depression in remission ([place])    Resolved, in remission Prior history, now no concerns anymore, not on treatment for >1+ years Not followed by Psychiatry No other complicating factors - except MS      Mixed hyperlipidemia    Prior results normal Due for labs today - non fasting but limited PO intake today Will check lipids      MS (multiple sclerosis) (HCC)    Chronic relapsing remitting MS. Currently stable without flare Complicated with neurogenic bladder, see A&P Followed by [facility] Dr Melrose Nakayama Continue current MS targeted therapy per med list Managed with outpatient PT - improving Pain management on Tramadol, Gabapentin Refill Gabapentin today      Relevant Medications   gabapentin (NEURONTIN) 300 MG capsule   Other Relevant Orders   CBC with Differential/Platelet   COMPLETE METABOLIC PANEL WITH GFR   Neurogenic bladder - Primary    Secondary to Multiple Sclerosis, with mixed overflow incontinence Followed by [facility] Neuro Not followed by Urology  Plan Increase Myrbetriq from 25 XL daily to 4m daily - new rx sent - max dose goal to reduce  frequency - Indicated for incontinence supplies, pull-ups, continue to use regularly to  prevent future UTI risk that would come with soiling. Will continue to treat bladder with Myrbetriq - Future may need Urology - discussed may need urodynamics or possible suprapubic in future if significant concern from MS      Relevant Medications   mirabegron ER (MYRBETRIQ) 50 MG TB24 tablet   Other Relevant Orders   CBC with Differential/Platelet   COMPLETE METABOLIC PANEL WITH GFR   Osteopenia    Stable on Alendronate for 3 years on bisphosphonate Refill Alendronate today Deferred repeat DEXA within next 1-3 years      Relevant Medications   alendronate (FOSAMAX) 70 MG tablet   Other Relevant Orders   VITAMIN D 25 Hydroxy (Vit-D Deficiency, Fractures)   Vitamin D deficiency    Continue supplement Check vitamin D today Known osteopenia      Relevant Orders   VITAMIN D 25 Hydroxy (Vit-D Deficiency, Fractures)    Other Visit Diagnoses    Screening for breast cancer       Relevant Orders   MM DIGITAL SCREENING BILATERAL   [place]yson-term use of high-risk medication       Relevant Orders   Hemoglobin A1c   CBC with Differential/Platelet   COMPLETE METABOLIC PANEL WITH GFR   Lipid panel      Completed order form for [place] [employer] ([employer]) [place] form for medical supplies / incontinence supplies To be faxed today   Meds ordered this encounter  Medications  . mirabegron ER (MYRBETRIQ) 50 MG TB24 tablet    Sig: Take 1 tablet (50 mg total) by mouth daily.    Dispense:  90 tablet    Refill:  3  . gabapentin (NEURONTIN) 300 MG capsule    Sig: Take 2 capsules (600 mg total) by mouth 3 (three) times daily.    Dispense:  540 capsule    Refill:  3  . pantoprazole (PROTONIX) 40 MG tablet    Sig: Take 1 tablet (40 mg total) by mouth daily before breakfast.    Dispense:  90 tablet    Refill:  3  . alendronate (FOSAMAX) 70 MG tablet    Sig: Take 1 tablet (70 mg total) by mouth every 7 (seven) days. Take with a full glass of water on an empty stomach.    Dispense:  12 tablet    Refill:   3     Follow up plan: Return in about 6 months (around 08/15/2018) for 6 month follow-up MS / Bladder.  Nobie Putnam, [facility] 02/12/2018, 2:26 PM

## 2018-02-12 NOTE — Patient Instructions (Addendum)
Thank you for coming to the office today.  Ordered blood tests today - stay tuned for results.  For Mammogram screening for breast cancer   Call the Imaging Center below anytime to schedule your own appointment now that order has been placed.  [facility] [address] [place], [place] [address] Phone: [phone]  ----------  Increased Myrbetriq bladder medicine - take ONE PILL of new med 50mg  daily - or you can finish old pills taking TWO of the 25mg  pills at once daily until finish. - SHould help reduce urgency with bladder and some frequency - If not improving we may get a 2nd opinion from a Urologist - and refer - let me know  ------- Refilled other medications for 90 day supply  Will fax the medical supply order - if need more in future - let them know and they can send new order   Please schedule a Follow-up Appointment to: Return in about 6 months (around 08/15/2018) for 6 month follow-up MS / Bladder.  If you have any other questions or concerns, please feel free to call the office or send a message through MyChart. You may also schedule an earlier appointment if necessary.  Additionally, you may be receiving a survey about your experience at our office within a few days to 1 week by e-mail or mail. We value your feedback.  [name], DO [facility], [place]

## 2018-02-12 NOTE — Assessment & Plan Note (Signed)
Stable chronic problem secondary to MS Followed by [facility] Neurology Checked [place] CSRS for past 2 years, appropriate Tramadol rx Managed on Tramadol, Gabapentin, Tylenol Refill Gabapentin

## 2018-02-12 NOTE — Assessment & Plan Note (Signed)
Secondary to neurogenic bladder / overflow incontinence mixed - due to Multiple Sclerosis See A&P - Indicated for incontinence supplies, pull-ups, continue to use regularly to prevent future UTI risk that would come with soiling. Will continue to treat bladder with Myrbetriq, now inc dose - Future may need Urology

## 2018-02-12 NOTE — Assessment & Plan Note (Signed)
Stable on Alendronate for 3 years on bisphosphonate Refill Alendronate today Deferred repeat DEXA within next 1-3 years

## 2018-02-12 NOTE — Assessment & Plan Note (Signed)
Stable without exacerbation Improved on Spiriva Continue Albuterol PRN 

## 2018-02-12 NOTE — Assessment & Plan Note (Signed)
Secondary to Multiple Sclerosis, with mixed overflow incontinence Followed by [facility] Neuro Not followed by Urology  Plan Increase Myrbetriq from 25 XL daily to 50mg  daily - new rx sent - max dose goal to reduce frequency - Indicated for incontinence supplies, pull-ups, continue to use regularly to prevent future UTI risk that would come with soiling. Will continue to treat bladder with Myrbetriq - Future may need Urology - discussed may need urodynamics or possible suprapubic in future if significant concern from MS

## 2018-02-12 NOTE — Assessment & Plan Note (Signed)
Continue supplement Check vitamin D today Known osteopenia

## 2018-02-12 NOTE — Assessment & Plan Note (Signed)
Prior results normal Due for labs today - non fasting but limited PO intake today Will check lipids

## 2018-02-12 NOTE — Assessment & Plan Note (Signed)
Stable Continues on PPI - refill today

## 2018-02-13 LAB — CBC WITH DIFFERENTIAL/PLATELET
BASOS ABS: 11 {cells}/uL (ref 0–200)
BASOS PCT: 0.3 %
EOS ABS: 61 {cells}/uL (ref 15–500)
Eosinophils Relative: 1.6 %
HCT: 38.5 % (ref 35.0–45.0)
HEMOGLOBIN: 13.2 g/dL (ref 11.7–15.5)
Lymphs Abs: 847 cells/uL — ABNORMAL LOW (ref 850–3900)
MCH: 33.8 pg — AB (ref 27.0–33.0)
MCHC: 34.3 g/dL (ref 32.0–36.0)
MCV: 98.5 fL (ref 80.0–100.0)
MONOS PCT: 11.5 %
MPV: 10.5 fL (ref 7.5–12.5)
NEUTROS ABS: 2443 {cells}/uL (ref 1500–7800)
Neutrophils Relative %: 64.3 %
Platelets: 194 10*3/uL (ref 140–400)
RBC: 3.91 10*6/uL (ref 3.80–5.10)
RDW: 12.4 % (ref 11.0–15.0)
TOTAL LYMPHOCYTE: 22.3 %
WBC mixed population: 437 cells/uL (ref 200–950)
WBC: 3.8 10*3/uL (ref 3.8–10.8)

## 2018-02-13 LAB — LIPID PANEL
CHOL/HDL RATIO: 2.9 (calc) (ref <5.0)
Cholesterol: 168 mg/dL (ref <200)
HDL: 58 mg/dL (ref >50)
LDL Cholesterol (Calc): 93 mg/dL (calc)
NON-HDL CHOLESTEROL (CALC): 110 mg/dL (ref <130)
Triglycerides: 77 mg/dL (ref <150)

## 2018-02-13 LAB — COMPLETE METABOLIC PANEL WITH GFR
AG RATIO: 1.4 (calc) (ref 1.0–2.5)
ALBUMIN MSPROF: 3.9 g/dL (ref 3.6–5.1)
ALKALINE PHOSPHATASE (APISO): 70 U/L (ref 33–130)
ALT: 9 U/L (ref 6–29)
AST: 11 U/L (ref 10–35)
BILIRUBIN TOTAL: 0.4 mg/dL (ref 0.2–1.2)
BUN: 8 mg/dL (ref 7–25)
CO2: 34 mmol/L — AB (ref 20–32)
CREATININE: 0.56 mg/dL (ref 0.50–0.99)
Calcium: 9.4 mg/dL (ref 8.6–10.4)
Chloride: 105 mmol/L (ref 98–110)
GFR, Est African American: 117 mL/min/{1.73_m2} (ref >=60)
GFR, Est Non African American: 101 mL/min/{1.73_m2} (ref >=60)
GLOBULIN: 2.8 g/dL (ref 1.9–3.7)
Glucose, Bld: 86 mg/dL (ref 65–99)
POTASSIUM: 4.6 mmol/L (ref 3.5–5.3)
SODIUM: 143 mmol/L (ref 135–146)
Total Protein: 6.7 g/dL (ref 6.1–8.1)

## 2018-02-13 LAB — HEMOGLOBIN A1C
HEMOGLOBIN A1C: 5.3 %{Hb} (ref <5.7)
MEAN PLASMA GLUCOSE: 105 (calc)
eAG (mmol/L): 5.8 (calc)

## 2018-02-13 LAB — VITAMIN D 25 HYDROXY (VIT D DEFICIENCY, FRACTURES): Vit D, 25-Hydroxy: 33 ng/mL (ref 30–100)

## 2018-02-16 ENCOUNTER — Ambulatory Visit: Payer: Medicare Other | Admitting: Occupational Therapy

## 2018-02-16 ENCOUNTER — Ambulatory Visit: Payer: Medicare Other | Admitting: Physical Therapy

## 2018-02-16 ENCOUNTER — Encounter: Payer: Self-pay | Admitting: Occupational Therapy

## 2018-02-16 ENCOUNTER — Encounter: Payer: Self-pay | Admitting: Physical Therapy

## 2018-02-16 DIAGNOSIS — R278 Other lack of coordination: Secondary | ICD-10-CM | POA: Diagnosis not present

## 2018-02-16 DIAGNOSIS — M6281 Muscle weakness (generalized): Secondary | ICD-10-CM

## 2018-02-16 DIAGNOSIS — R262 Difficulty in walking, not elsewhere classified: Secondary | ICD-10-CM | POA: Diagnosis not present

## 2018-02-16 NOTE — Therapy (Signed)
[facility] MAIN [facility] SERVICES [address] [place], [place], [address] Phone: [phone]   Fax:  [phone]  Occupational Therapy Treatment  Patient Details  Name: [name] MRN: [id number] Date of Birth: [birth year] Referring Provider: Morene Crocker   Encounter Date: 02/16/2018  OT End of Session - 02/16/18 1309    Visit Number  25    Number of Visits  48    Date for OT Re-Evaluation  04/13/18    Authorization Type  Visit 6 of 10 for progress report period starting 01/21/2018    OT Start Time  1300    OT Stop Time  1345    OT Time Calculation (min)  45 min    Activity Tolerance  Patient tolerated treatment well    Behavior During Therapy  [facility] for tasks assessed/performed       Past Medical History:  Diagnosis Date  . Headache   . Hyperlipidemia   . MS (multiple sclerosis) (HCC) 1990  . Neurogenic bladder     Past Surgical History:  Procedure Laterality Date  . ABDOMINAL HYSTERECTOMY    . OOPHORECTOMY Bilateral     There were no vitals filed for this visit.  Subjective Assessment - 02/16/18 1308    Subjective   Pt. requires verbal cues to navigate through the gym.    Pertinent History  Patient reports she was diagnosed with MS years ago and has recently started to feel weak in her left dominant arm and hand.  She denies any previous therapy and reports memory issues and can provide a limited history.     Patient Stated Goals  Patient reports she wants to be able to walk better, use her left hand and be able to write again.     Currently in Pain?  Yes    Pain Score  4     Pain Location  Leg    Pain Orientation  Left    Pain Descriptors / Indicators  Aching    Pain Type  Chronic pain      OT TREATMENT    Neuro muscular re-education:  Pt. worked on [facility] skills grasping 1/2" magnetic pegs with her left hand, and placing them onto a vertical whiteboard placed at tabletop height. Pt. required verbal cues and assist to  sort the pegs by color. Pt. required consistent verbal cues. Pt. worked on removing the pegs alternating thumb opposition to the tip of her 2nd through 5th digits. Pt. worked on [facility] skills grasping small 1/4" pegs, and placing them on a pegboard placed at a tabletop. Pt. requires cues for sorting the pegs by color. Increased time was required for the task.                       OT Education - 02/16/18 1309    Education provided  Yes    Education Details  fine motor coordination    Person(s) Educated  Patient    Methods  Explanation;Demonstration;Verbal cues    Comprehension  Verbal cues required;Returned demonstration;Verbalized understanding          OT [place] Term Goals - 01/19/18 1401      OT [place] TERM GOAL #1   Title  Patient will demonstrate holding utensils in her left hand and feeding herself with modified independence with minimal spillage.      Baseline  01/19/2018: Pt. requires minA    Time  8    Period  Weeks  Status  On-going    Target Date  04/13/18      OT [place] TERM GOAL #2   Title  Patient wil complete tub/shower transfer with supervision only.    Baseline  min assist at eval     Time  8    Period  Weeks    Status  On-going    Target Date  04/13/18      OT [place] TERM GOAL #4   Title  Patient will increase left UE strength by 1 mm grade to be able to reach into closet to retrieve clothing items.     Baseline  Pt. conitnues to require assist from aide, 3+/5 strength at eval    Time  12    Period  Weeks    Status  On-going    Target Date  04/13/18      OT [place] TERM GOAL #5   Title  Patient will improve coordination on the left to be able to hold pen securely and write her name with 75% legibility.     Baseline  Pt. continues to present with decreased legibility with printing her name.     Time  12    Period  Weeks    Status  On-going    Target Date  04/13/18      OT [place] TERM GOAL #6   Title  Patient will complete lower body dressing with  modified independence using adaptive equipment if needed.     Baseline  Pt. continues to require minA able to get pants on, difficulty with tying shoes 6/19    Time  12    Period  Weeks    Status  On-going    Target Date  04/13/18            Plan - 02/16/18 1310    Clinical Impression Statement  Pt. reports that she walked to the rehab clinic form the main hospital door using her rolator while being escorted by a volunteer. Pt. reports that she felt good doing that. Pt. requires assist and verbal cues for sorting by [facility] objects by various colors. Pt. continues to work on improving UE functioning, motor control, and coordination skills for improved engagement in ADL, and IADL tasks.    Occupational Profile and client history currently impacting functional performance  patient is legally blind, progressive disease process with MS for over 20+ years, lives alone, limited availability for transportation.      Occupational performance deficits (Please refer to evaluation for details):  ADL's;Leisure    Rehab Potential  Good    OT Frequency  2x / week    OT Duration  12 weeks    OT Treatment/Interventions  Therapeutic exercise;Cognitive remediation/compensation;Functional Mobility Training    Plan  Positive:  motivation, family support.  Negative:  lives alone, legally blind, requires caregiver 7 days a week for a few hours a day for self care    Clinical Decision Making  Several treatment options, min-mod task modification necessary    Consulted and Agree with Plan of Care  Patient       Patient will benefit from skilled therapeutic intervention in order to improve the following deficits and impairments:  Decreased cognition, Decreased knowledge of use of DME, Impaired vision/preception, Pain, Decreased coordination, Decreased activity tolerance, Decreased endurance, Decreased strength, Decreased balance, Impaired UE functional use  Visit Diagnosis: Other lack of coordination  Muscle  weakness (generalized)    Problem List Patient Active Problem List   Diagnosis Date  Noted  . Lower extremity edema 04/15/2017  . Macrocytic anemia 02/11/2017  . Vitamin D deficiency 02/02/2016  . Mixed hyperlipidemia 01/03/2015  . MS (multiple sclerosis) (HCC) 01/03/2015  . Chronic pain 01/03/2015  . GERD without esophagitis 01/03/2015  . COPD (chronic obstructive pulmonary disease) (HCC) 01/03/2015  . Osteopenia 01/03/2015  . Neurogenic bladder 01/03/2015  . Incontinence in female 01/03/2015    Olegario Messier, MS, OTR/L 02/16/2018, 1:21 PM  [facility] MAIN [facility] SERVICES [address] [place], [place], [address] Phone: [phone]   Fax:  [phone]  Name: NYEEMAH JENNETTE MRN: [id number] Date of Birth: 12/14/1955

## 2018-02-16 NOTE — Therapy (Signed)
[name] [name] SERVICES [address] [place], [place], [address] Phone: [phone]   Fax:  [phone]  Physical Therapy Treatment  Patient Details  Name: [name] MRN: [id number] Date of Birth: 09/07/1955 Referring Provider: Anabel Benson   Encounter Date: 02/16/2018  PT End of Session - 02/16/18 1353    Visit Number  10    Number of Visits  25    Date for PT Re-Evaluation  04/01/18    PT Start Time  0145    PT Stop Time  0230    PT Time Calculation (min)  45 min    Equipment Utilized During Treatment  Gait belt    Activity Tolerance  Patient tolerated treatment well    Behavior During Therapy  [facility] for tasks assessed/performed       Past Medical History:  Diagnosis Date  . Headache   . Hyperlipidemia   . MS (multiple sclerosis) ([place]) 1990  . Neurogenic bladder     Past Surgical History:  Procedure Laterality Date  . ABDOMINAL HYSTERECTOMY    . OOPHORECTOMY Bilateral     There were no vitals filed for this visit.  Subjective Assessment - 02/16/18 1521    Subjective  Patient reports that she is doing ok today. No specific questions or concerns. She denies pain upon arrival today. Pt reports compliance with her HEP    Pertinent History  Patient reports about 3 months ago she started to feel weak in the left arm, reports she is legally blind.       How Scarola can you stand comfortably?  not too Li[place]     Patient Stated Goals  Patient wants to be able to walk better.     Pain Onset  More than a month ago    Multiple Pain Sites  No        Therapeutic Exercise:  Leg press 75 lbs x 20 x 2 , heel raises 45 lbs 20 x 2   SAQ with 3 lbs and 3 sec hold x 20 x 2  Bridges x 10 x 2  Hooklying abd/ER with GTB x 20   sidelying hip abd x 15 with 3 lbs BLE  Heel slides with 3 lbs x 10 x 2 BLE:  Floor to 6 inch stool step ups x 20   TM walking 1.0 x 6 mins  Backwards ambulation/ side stepping left and right  x 200 feet CGA                      PT Education - 02/16/18 1522    Education Details  HEP    Person(s) Educated  Patient    Methods  Explanation    Comprehension  Verbalized understanding;Returned demonstration       PT Short Term Goals - 02/11/18 1602      PT SHORT TERM GOAL #1   Title  Patient will be independent in home exercise program to improve strength/mobility for better functional independence with ADLs.    Time  6    Period  Weeks    Status  On-going    Target Date  02/18/18      PT SHORT TERM GOAL #2   Title  nt (< 28 years old) will complete five times sit to stand test in < 10 seconds indicating an increased LE strength and improved balance.    Baseline  02/11/18= 19.38    Time  6  Period  Weeks    Status  On-going    Target Date  02/18/18        PT Gartley Term Goals - 02/11/18 1546      PT Damaso TERM GOAL #1   Title  Patient will increase six minute walk test distance to >1000 for progression to community ambulator and improve gait ability    Baseline  02/11/18= 500 feet     Time  12    Period  Weeks    Status  New    Target Date  04/01/18      PT Pless TERM GOAL #2   Title  Patient will increase 10 meter walk test to >1.82ms as to improve gait speed for better community ambulation and to reduce fall risk.    Baseline  02/11/18 . 68 m/sec    Time  12    Period  Weeks    Status  New    Target Date  04/01/18      PT Donado TERM GOAL #3   Title  Patient will reduce timed up and go to <11 seconds to reduce fall risk and demonstrate improved transfer/gait ability.    Baseline  17.49 sec 02/11/18    Time  12    Period  Weeks    Status  Partially Met    Target Date  04/01/18      PT Micke TERM GOAL #4   Title  Patient (> 63years old) will complete five times sit to stand test in < 15 seconds indicating an increased LE strength and improved balance.    Baseline  19.38sec 02/11/18    Time  12    Period  Weeks    Status  New    Target Date   04/01/18            Plan - 02/16/18 1525    Clinical Impression Statement  Pt demonstrates excellent motivation with therapy. She is able to progress to no UE support with step-ups to 6" step with therapist. Minimal rest breaks required during session. Pt requires intermittent cues for proper exercise technique. Patient will benefit from skilled PT in order to increase gait speed, increase BLE strength, and improve dynamic standing balance to decrease risk for falls and enable patient to participate in desired activities.   Rehab Potential  Good    PT Frequency  2x / week    PT Duration  8 weeks    PT Treatment/Interventions  Gait training;Therapeutic exercise;Therapeutic activities;Functional mobility training;Neuromuscular re-education;Patient/family education;Balance training    PT Home Exercise Plan  HEP    Consulted and Agree with Plan of Care  Patient       Patient will benefit from skilled therapeutic intervention in order to improve the following deficits and impairments:  Abnormal gait, Decreased balance, Decreased endurance, Postural dysfunction, Impaired flexibility, Decreased strength, Decreased safety awareness, Decreased activity tolerance, Decreased range of motion, Decreased knowledge of precautions, Impaired sensation, Difficulty walking  Visit Diagnosis: Other lack of coordination  Muscle weakness (generalized)  Difficulty in walking, not elsewhere classified     Problem List Patient Active Problem List   Diagnosis Date Noted  . Lower extremity edema 04/15/2017  . Macrocytic anemia 02/11/2017  . Vitamin D deficiency 02/02/2016  . Mixed hyperlipidemia 01/03/2015  . MS (multiple sclerosis) ([place] 01/03/2015  . Chronic pain 01/03/2015  . GERD without esophagitis 01/03/2015  . COPD ([place] disease) [place] 01/03/2015  . Osteopenia 01/03/2015  . Neurogenic bladder  01/03/2015  . Incontinence in female 01/03/2015    Alanson Puls, PT DPT 02/16/2018, 4:42 PM  [place] MAIN [facility] SERVICES [address] [place], [place], [address] Phone: [phone]   Fax:  [phone]  Name: [name] MRN: [id number] Date of Birth: August 27, 1955

## 2018-02-18 ENCOUNTER — Encounter: Payer: Self-pay | Admitting: Occupational Therapy

## 2018-02-18 ENCOUNTER — Ambulatory Visit: Payer: Medicare Other | Admitting: Occupational Therapy

## 2018-02-18 ENCOUNTER — Ambulatory Visit: Payer: Medicare Other | Admitting: Physical Therapy

## 2018-02-18 ENCOUNTER — Encounter: Payer: Self-pay | Admitting: Physical Therapy

## 2018-02-18 DIAGNOSIS — R278 Other lack of coordination: Secondary | ICD-10-CM | POA: Diagnosis not present

## 2018-02-18 DIAGNOSIS — M6281 Muscle weakness (generalized): Secondary | ICD-10-CM | POA: Diagnosis not present

## 2018-02-18 DIAGNOSIS — R262 Difficulty in walking, not elsewhere classified: Secondary | ICD-10-CM

## 2018-02-18 NOTE — Therapy (Signed)
[place] MAIN [facility] SERVICES [address] [place], [place], [address] Phone: [phone]   Fax:  [phone]  Physical Therapy Treatment  Patient Details  Name: [name] MRN: [id number] Date of Birth: 1955/12/06 Referring Provider: Anabel Bene   Encounter Date: 02/18/2018  PT End of Session - 02/18/18 1558    Visit Number  11    Number of Visits  25    Date for PT Re-Evaluation  04/01/18    PT Start Time  0315    PT Stop Time  0400    PT Time Calculation (min)  45 min    Equipment Utilized During Treatment  Gait belt    Activity Tolerance  Patient tolerated treatment well    Behavior During Therapy  [facility] for tasks assessed/performed       Past Medical History:  Diagnosis Date  . Headache   . Hyperlipidemia   . MS (multiple sclerosis) ([place]) 1990  . Neurogenic bladder     Past Surgical History:  Procedure Laterality Date  . ABDOMINAL HYSTERECTOMY    . OOPHORECTOMY Bilateral     There were no vitals filed for this visit.  Subjective Assessment - 02/18/18 1557    Subjective  Patient reports that she is doing ok today. No specific questions or concerns. She denies pain upon arrival today. Pt reports compliance with her HEP    Pertinent History  Patient reports about 3 months ago she started to feel weak in the left arm, reports she is legally blind.       How Hellberg can you stand comfortably?  not too Jiggetts     Patient Stated Goals  Patient wants to be able to walk better.     Currently in Pain?  No/denies    Pain Score  0-No pain    Pain Onset  More than a month ago       Treatment:  TM x . 5 miles/ hour x 5 mins   Leg press x 20 90 lbs   Lunge into BOSU ball x 15 BLE with cues to bend her knee fwd more and keeping her back leg further back  Step ups to 6 inch stool x 20 BLE, HHA  Step backwards to 6 inch stool x 20 BLE, HHA  Side stepping over 6 inch step stool x 20 BLE, HHA  Toe taps to 6 inch  stool x 20 , HHA  Standing hip abd, hip ext with RTB x 10 x 2 ,HHA  Heel raises x 20,HHA  Squats x 10 with 3 second hold, HHA  Sit to stand with 2 lb rod x 10 reps  CGA and Min  verbal cues used throughout. Continues to have balance deficits typical with diagnosis. Patient performs intermediate level exercises without pain behaviors and needs verbal cuing for postural alignment                         PT Education - 02/18/18 1558    Education Details  HEP    Person(s) Educated  Patient    Methods  Explanation;Tactile cues;Verbal cues    Comprehension  Returned demonstration;Verbalized understanding       PT Short Term Goals - 02/11/18 1602      PT SHORT TERM GOAL #1   Title  Patient will be independent in home exercise program to improve strength/mobility for better functional independence with ADLs.    Time  6  Period  Weeks    Status  On-going    Target Date  02/18/18      PT SHORT TERM GOAL #2   Title  nt (< 19 years old) will complete five times sit to stand test in < 10 seconds indicating an increased LE strength and improved balance.    Baseline  02/11/18= 19.38    Time  6    Period  Weeks    Status  On-going    Target Date  02/18/18        PT Deshazer Term Goals - 02/11/18 1546      PT Palos TERM GOAL #1   Title  Patient will increase six minute walk test distance to >1000 for progression to community ambulator and improve gait ability    Baseline  02/11/18= 500 feet     Time  12    Period  Weeks    Status  New    Target Date  04/01/18      PT Friel TERM GOAL #2   Title  Patient will increase 10 meter walk test to >1.57ms as to improve gait speed for better community ambulation and to reduce fall risk.    Baseline  02/11/18 . 68 m/sec    Time  12    Period  Weeks    Status  New    Target Date  04/01/18      PT Hoelting TERM GOAL #3   Title  Patient will reduce timed up and go to <11 seconds to reduce fall risk and demonstrate improved  transfer/gait ability.    Baseline  17.49 sec 02/11/18    Time  12    Period  Weeks    Status  Partially Met    Target Date  04/01/18      PT Wimer TERM GOAL #4   Title  Patient (> 622years old) will complete five times sit to stand test in < 15 seconds indicating an increased LE strength and improved balance.    Baseline  19.38sec 02/11/18    Time  12    Period  Weeks    Status  New    Target Date  04/01/18            Plan - 02/18/18 1559    Clinical Impression Statement  Pt demonstrates excellent motivation with therapy. She is able to progress to no UE support with step-ups to 6" step with therapist. Minimal rest breaks required during session. Pt requires intermittent cues for proper exercise technique. Patient will benefit from skilled PT in order to increase gait speed, increase BLE strength, and improve dynamic standing balance to decrease risk for falls and enable patient to participate in desired activities    Rehab Potential  Good    PT Frequency  2x / week    PT Duration  8 weeks    PT Treatment/Interventions  Gait training;Therapeutic exercise;Therapeutic activities;Functional mobility training;Neuromuscular re-education;Patient/family education;Balance training    PT Home Exercise Plan  HEP    Consulted and Agree with Plan of Care  Patient       Patient will benefit from skilled therapeutic intervention in order to improve the following deficits and impairments:  Abnormal gait, Decreased balance, Decreased endurance, Postural dysfunction, Impaired flexibility, Decreased strength, Decreased safety awareness, Decreased activity tolerance, Decreased range of motion, Decreased knowledge of precautions, Impaired sensation, Difficulty walking  Visit Diagnosis: Muscle weakness (generalized)  Other lack of coordination  Difficulty in walking, not elsewhere classified  Problem List Patient Active Problem List   Diagnosis Date Noted  . Lower extremity edema  04/15/2017  . Macrocytic anemia 02/11/2017  . Vitamin D deficiency 02/02/2016  . Mixed hyperlipidemia 01/03/2015  . MS (multiple sclerosis) ([place]) 01/03/2015  . Chronic pain 01/03/2015  . GERD without esophagitis 01/03/2015  . COPD (chronic obstructive pulmonary disease) ([place]) 01/03/2015  . Osteopenia 01/03/2015  . Neurogenic bladder 01/03/2015  . Incontinence in female 01/03/2015    Alanson Puls , PT DPT 02/18/2018, 4:02 PM  [place] MAIN [facility] SERVICES [address] [place], [place], [address] Phone: [phone]   Fax:  [phone]  Name: LYFE REIHL MRN: [id number] Date of Birth: 15-Mar-1956

## 2018-02-18 NOTE — Therapy (Signed)
[place] [facility] MAIN [facility] SERVICES [address] [place], [place], [address] Phone: [phone]   Fax:  [phone]  Occupational Therapy Treatment  Patient Details  Name: [name] MRN: [id number] Date of Birth: 1956/06/13 Referring Provider: Morene Crocker   Encounter Date: 02/18/2018  OT End of Session - 02/18/18 1442    Visit Number  26    Number of Visits  48    Authorization Type  Visit 7 of 10 for progress report period starting 01/21/2018    OT Start Time  1430    OT Stop Time  1515    OT Time Calculation (min)  45 min    Equipment Utilized During Treatment  adaptive pens    Activity Tolerance  Patient tolerated treatment well    Behavior During Therapy  [facility] for tasks assessed/performed       Past Medical History:  Diagnosis Date  . Headache   . Hyperlipidemia   . MS (multiple sclerosis) ([facility]) 1990  . Neurogenic bladder     Past Surgical History:  Procedure Laterality Date  . ABDOMINAL HYSTERECTOMY    . OOPHORECTOMY Bilateral     There were no vitals filed for this visit.  Subjective Assessment - 02/18/18 1441    Subjective   Pt. requires verbal cues to navigate through the gym.    Pertinent History  Patient reports she was diagnosed with MS years ago and has recently started to feel weak in her left dominant arm and hand.  She denies any previous therapy and reports memory issues and can provide a limited history.     Patient Stated Goals  Patient reports she wants to be able to walk better, use her left hand and be able to write again.     Currently in Pain?  Yes    Pain Score  4     Pain Location  Leg    Pain Orientation  Left    Pain Descriptors / Indicators  Aching    Pain Type  Chronic pain    Pain Onset  More than a month ago      OT TREATMENT    Neuro muscular re-education:  Pt. worked on improving left hand [facility] skills grasping 1/2" magnetic pegs, and placing them on a whiteboard in vertical column.  Pt. required increased time to complete the task. Pt. worked on removing them from the pegboard while alternating thumb opposition to the tip of the 2nd through 5th digits. Pt. worked on grasping small 1/4" pegs, and placing them in a small pegboard. Pt. Requires frequent verbal cues to use her left hand.                            OT Education - 02/18/18 1442    Education provided  Yes    Education Details  fine motor coordination    Person(s) Educated  Patient    Methods  Explanation;Demonstration;Verbal cues    Comprehension  Verbal cues required;Returned demonstration;Verbalized understanding          OT Gemmer Term Goals - 01/19/18 1401      OT Kackley TERM GOAL #1   Title  Patient will demonstrate holding utensils in her left hand and feeding herself with modified independence with minimal spillage.      Baseline  01/19/2018: Pt. requires minA    Time  8    Period  Weeks    Status  On-going    Target Date  04/13/18      OT Clavin TERM GOAL #2   Title  Patient wil complete tub/shower transfer with supervision only.    Baseline  min assist at eval     Time  8    Period  Weeks    Status  On-going    Target Date  04/13/18      OT Epstein TERM GOAL #4   Title  Patient will increase left UE strength by 1 mm grade to be able to reach into closet to retrieve clothing items.     Baseline  Pt. conitnues to require assist from aide, 3+/5 strength at eval    Time  12    Period  Weeks    Status  On-going    Target Date  04/13/18      OT Desilets TERM GOAL #5   Title  Patient will improve coordination on the left to be able to hold pen securely and write her name with 75% legibility.     Baseline  Pt. continues to present with decreased legibility with printing her name.     Time  12    Period  Weeks    Status  On-going    Target Date  04/13/18      OT Garrow TERM GOAL #6   Title  Patient will complete lower body dressing with modified independence using adaptive  equipment if needed.     Baseline  Pt. continues to require minA able to get pants on, difficulty with tying shoes 6/19    Time  12    Period  Weeks    Status  On-going    Target Date  04/13/18            Plan - 02/18/18 1443    Clinical Impression Statement Pt. continues to present with limited LUE strength, motor control, and [facility] skills. Pt. requires frequent verbal, and tactile cues to to use her left hand to manipulate the objects. Pt. requires verbal cues for sorting the the peg color into the correct vertical column. Pt. continues to work on improving left hand functioning for improved use during ADLs, and IADLs.   Occupational Profile and client history currently impacting functional performance  patient is legally blind, progressive disease process with MS for over 20+ years, lives alone, limited availability for transportation.      Occupational performance deficits (Please refer to evaluation for details):  ADL's;Leisure    Rehab Potential  Good    OT Frequency  2x / week    OT Duration  12 weeks    OT Treatment/Interventions  Therapeutic exercise;Cognitive remediation/compensation;Functional Mobility Training    Plan  Positive:  motivation, family support.  Negative:  lives alone, legally blind, requires caregiver 7 days a week for a few hours a day for self care    Clinical Decision Making  Several treatment options, min-mod task modification necessary    Consulted and Agree with Plan of Care  Patient       Patient will benefit from skilled therapeutic intervention in order to improve the following deficits and impairments:  Decreased cognition, Decreased knowledge of use of DME, Impaired vision/preception, Pain, Decreased coordination, Decreased activity tolerance, Decreased endurance, Decreased strength, Decreased balance, Impaired UE functional use  Visit Diagnosis: Muscle weakness (generalized)  Other lack of coordination    Problem List Patient Active Problem  List   Diagnosis Date Noted  . Lower extremity edema 04/15/2017  .  Macrocytic anemia 02/11/2017  . Vitamin D deficiency 02/02/2016  . Mixed hyperlipidemia 01/03/2015  . MS (multiple sclerosis) ([facility]) 01/03/2015  . Chronic pain 01/03/2015  . GERD without esophagitis 01/03/2015  . COPD (chronic obstructive pulmonary disease) ([facility]) 01/03/2015  . Osteopenia 01/03/2015  . Neurogenic bladder 01/03/2015  . Incontinence in female 01/03/2015    Olegario Messier, MS, OTR/L 02/18/2018, 2:54 PM  [place] [facility] MAIN [facility] SERVICES [address] [place], [place], [address] Phone: [phone]   Fax:  [phone]  Name: [name] MRN: [id number] Date of Birth: [birth year]

## 2018-02-26 ENCOUNTER — Ambulatory Visit: Payer: Medicare Other | Admitting: Occupational Therapy

## 2018-02-26 ENCOUNTER — Ambulatory Visit: Payer: Medicare Other | Attending: Neurology | Admitting: Physical Therapy

## 2018-02-26 ENCOUNTER — Encounter: Payer: Self-pay | Admitting: Occupational Therapy

## 2018-02-26 ENCOUNTER — Encounter: Payer: Self-pay | Admitting: Physical Therapy

## 2018-02-26 DIAGNOSIS — M6281 Muscle weakness (generalized): Secondary | ICD-10-CM

## 2018-02-26 DIAGNOSIS — R278 Other lack of coordination: Secondary | ICD-10-CM

## 2018-02-26 DIAGNOSIS — R262 Difficulty in walking, not elsewhere classified: Secondary | ICD-10-CM | POA: Diagnosis not present

## 2018-02-26 NOTE — Therapy (Signed)
[facility] MAIN [facility] SERVICES [address] [place], [place], [address] Phone: [phone]   Fax:  [phone]  Occupational Therapy Treatment  Patient Details  Name: [name] MRN: [id number] Date of Birth: [birth year] Referring Provider: Morene Crocker   Encounter Date: 02/26/2018  OT End of Session - 02/26/18 1159    Visit Number  27    Number of Visits  48    Date for OT Re-Evaluation  04/13/18    Authorization Type  Visit 8 of 10 for progress report period starting 01/21/2018    OT Start Time  1145    OT Stop Time  1230    OT Time Calculation (min)  45 min    Activity Tolerance  Patient tolerated treatment well    Behavior During Therapy  [facility] for tasks assessed/performed       Past Medical History:  Diagnosis Date  . Headache   . Hyperlipidemia   . MS (multiple sclerosis) (HCC) 1990  . Neurogenic bladder     Past Surgical History:  Procedure Laterality Date  . ABDOMINAL HYSTERECTOMY    . OOPHORECTOMY Bilateral     There were no vitals filed for this visit.  Subjective Assessment - 02/26/18 1156    Subjective   Pt. reports that she walked to the gym from the front entrance of the hospital.    Pertinent History  Patient reports she was diagnosed with MS years ago and has recently started to feel weak in her left dominant arm and hand.  She denies any previous therapy and reports memory issues and can provide a limited history.     Patient Stated Goals  Patient reports she wants to be able to walk better, use her left hand and be able to write again.     Currently in Pain?  No/denies      OT TREATMENT    Neuro muscular re-education:  Pt. worked on grasping heavy duty resistive magnets of varying sizes. Pt. worked on placing them on a whiteboard positioned at a vertical angle on the tabletop. Pt. worked on Human resources officer with resistance. Pt. worked on [facility] grasping 1/2" small magnetic pegs, disconnecting them,  and placing them on a positioned at a vertical angle at a tabletop. Pt. worked on removing the pegs while alternating thumb opposition to the tip of the 2nd through 5th digits. Pt. requires verbal cues for accuracy when sorting the pegs by color. Pt. worked on grasping, and positioning magnetic hooks on a whiteboard positioned at a vertical angle on the tabletop. Pt. Worked on removing the pegs using thumb opposition to the tip of her 2nd digit, and thumb. Pt. Worked on [facility] skills grasping 1/2" flat washers, and placing them on the hooks. Pt. worked on grasping 1/2" flat marbles, storing them in her left hand, and translatory movements working them from her palm to the tip of her 2nd digit, and thumb. Pt. dropped several marbles.                             OT Education - 02/26/18 1159    Education provided  Yes    Education Details  fine motor coordination    Person(s) Educated  Patient    Methods  Explanation;Demonstration;Verbal cues    Comprehension  Verbal cues required;Returned demonstration;Verbalized understanding          OT [place] Term Goals - 01/19/18 1401  OT Postema TERM GOAL #1   Title  Patient will demonstrate holding utensils in her left hand and feeding herself with modified independence with minimal spillage.      Baseline  01/19/2018: Pt. requires minA    Time  8    Period  Weeks    Status  On-going    Target Date  04/13/18      OT Runquist TERM GOAL #2   Title  Patient wil complete tub/shower transfer with supervision only.    Baseline  min assist at eval     Time  8    Period  Weeks    Status  On-going    Target Date  04/13/18      OT [place] TERM GOAL #4   Title  Patient will increase left UE strength by 1 mm grade to be able to reach into closet to retrieve clothing items.     Baseline  Pt. conitnues to require assist from aide, 3+/5 strength at eval    Time  12    Period  Weeks    Status  On-going    Target Date  04/13/18      OT [place]  TERM GOAL #5   Title  Patient will improve coordination on the left to be able to hold pen securely and write her name with 75% legibility.     Baseline  Pt. continues to present with decreased legibility with printing her name.     Time  12    Period  Weeks    Status  On-going    Target Date  04/13/18      OT Parmer TERM GOAL #6   Title  Patient will complete lower body dressing with modified independence using adaptive equipment if needed.     Baseline  Pt. continues to require minA able to get pants on, difficulty with tying shoes 6/19    Time  12    Period  Weeks    Status  On-going    Target Date  04/13/18            Plan - 02/26/18 1200    Clinical Impression Statement Pt. reports that she has noticed she has been shakier over the past few days than usual. Pt. is initiating more with her LUE today. Pt. continues to work on improving LUE strength, motor control, and [facility] skills in order to improve engagement in ADL, and IADL tasks.      Occupational Profile and client history currently impacting functional performance  patient is legally blind, progressive disease process with MS for over 20+ years, lives alone, limited availability for transportation.      Occupational performance deficits (Please refer to evaluation for details):  ADL's;Leisure    Rehab Potential  Good    OT Frequency  2x / week    OT Duration  12 weeks    OT Treatment/Interventions  Therapeutic exercise;Cognitive remediation/compensation;Functional Mobility Training    Plan  Positive:  motivation, family support.  Negative:  lives alone, legally blind, requires caregiver 7 days a week for a few hours a day for self care    Clinical Decision Making  Several treatment options, min-mod task modification necessary    Consulted and Agree with Plan of Care  Patient       Patient will benefit from skilled therapeutic intervention in order to improve the following deficits and impairments:  Decreased cognition,  Decreased knowledge of use of DME, Impaired vision/preception, Pain, Decreased coordination, Decreased  activity tolerance, Decreased endurance, Decreased strength, Decreased balance, Impaired UE functional use  Visit Diagnosis: Muscle weakness (generalized)  Other lack of coordination    Problem List Patient Active Problem List   Diagnosis Date Noted  . Lower extremity edema 04/15/2017  . Macrocytic anemia 02/11/2017  . Vitamin D deficiency 02/02/2016  . Mixed hyperlipidemia 01/03/2015  . MS (multiple sclerosis) (HCC) 01/03/2015  . Chronic pain 01/03/2015  . GERD without esophagitis 01/03/2015  . COPD (chronic obstructive pulmonary disease) (HCC) 01/03/2015  . Osteopenia 01/03/2015  . Neurogenic bladder 01/03/2015  . Incontinence in female 01/03/2015    Olegario Messier, MS, OTR/L 02/26/2018, 12:09 PM  [facility] MAIN [facility] SERVICES [address] [place], [place], [address] Phone: [phone]   Fax:  [phone]  Name: MARIALENA WOLLEN MRN: [id number] Date of Birth: 03-10-1956

## 2018-02-26 NOTE — Therapy (Signed)
[place] MAIN [facility] SERVICES [address] [place], [place], [address] Phone: [phone]   Fax:  [phone]  Physical Therapy Treatment  Patient Details  Name: [name] MRN: [id number] Date of Birth: 1956-02-06 Referring Provider: Anabel Bene   Encounter Date: 02/26/2018  PT End of Session - 02/26/18 1322    Visit Number  12    Number of Visits  25    Date for PT Re-Evaluation  04/01/18    PT Start Time  0103    PT Stop Time  0145    PT Time Calculation (min)  42 min    Equipment Utilized During Treatment  Gait belt    Activity Tolerance  Patient tolerated treatment well    Behavior During Therapy  [facility] for tasks assessed/performed       Past Medical History:  Diagnosis Date  . Headache   . Hyperlipidemia   . MS (multiple sclerosis) ([place]) 1990  . Neurogenic bladder     Past Surgical History:  Procedure Laterality Date  . ABDOMINAL HYSTERECTOMY    . OOPHORECTOMY Bilateral     There were no vitals filed for this visit.  Subjective Assessment - 02/26/18 1321    Subjective  Patient reports that she is doing ok today, her pain is 4/10 left leg.  No specific questions or concerns. She denies pain upon arrival today. Pt reports compliance with her HEP    Pertinent History  Patient reports about 3 months ago she started to feel weak in the left arm, reports she is legally blind.       How Yingst can you stand comfortably?  not too [place]     Patient Stated Goals  Patient wants to be able to walk better.     Currently in Pain?  Yes    Pain Score  4     Pain Location  Leg    Pain Orientation  Left    Pain Onset  More than a month ago        Treatment:   Walking fwd/bwd, side to side x 200 feet x 4 , min assist and vc for BLE foot position correction  Standing on 1/2 bolster (Flat side up): slow head turns x 10, 50% UE support needed for balance loss  Heel/toe rock x15 with finger tip hold for balance with cues to  keep knee straight for better ankle strategies, min assist  Feet in neutral, BUE wand flexion x15 reps with min A for safety and cues to improve weight shift for better stance control; patient was able to utilize ankle strategies well with less loss of balance  Stepping over 1/2 foam,  unsupported x 2 reps each foot in front with min A for safety and cues to improve upper weight shift and trunk control for better balance in narrow base of support    Calf stretch 20 sec hold x2 reps with min VCs for correct positioning  Forward/backward, side/side teeter x 2 min each with rail assist for safety and cues to improve weight shift for better dynamic balance     CGA and  mod verbal cues used throughout with increased in postural sway and LOB most seen with narrow base of support and while on uneven surfaces. Continues to have balance deficits typical with diagnosis. Patient performs beginning level exercises without pain behaviors and needs verbal cuing for postural alignment and head position  PT Education - 02/26/18 1322    Education Details  balance, safety , HEP    Person(s) Educated  Patient    Methods  Explanation;Demonstration;Verbal cues    Comprehension  Verbalized understanding;Need further instruction       PT Short Term Goals - 02/11/18 1602      PT SHORT TERM GOAL #1   Title  Patient will be independent in home exercise program to improve strength/mobility for better functional independence with ADLs.    Time  6    Period  Weeks    Status  On-going    Target Date  02/18/18      PT SHORT TERM GOAL #2   Title  nt (< 35 years old) will complete five times sit to stand test in < 10 seconds indicating an increased LE strength and improved balance.    Baseline  02/11/18= 19.38    Time  6    Period  Weeks    Status  On-going    Target Date  02/18/18        PT Friday Term Goals - 02/11/18 1546      PT Pelissier TERM GOAL #1   Title  Patient will  increase six minute walk test distance to >1000 for progression to community ambulator and improve gait ability    Baseline  02/11/18= 500 feet     Time  12    Period  Weeks    Status  New    Target Date  04/01/18      PT Mon[place]ais TERM GOAL #2   Title  Patient will increase 10 meter walk test to >1.47ms as to improve gait speed for better community ambulation and to reduce fall risk.    Baseline  02/11/18 . 68 m/sec    Time  12    Period  Weeks    Status  New    Target Date  04/01/18      PT Jasek TERM GOAL #3   Title  Patient will reduce timed up and go to <11 seconds to reduce fall risk and demonstrate improved transfer/gait ability.    Baseline  17.49 sec 02/11/18    Time  12    Period  Weeks    Status  Partially Met    Target Date  04/01/18      PT Lebo TERM GOAL #4   Title  Patient (> 615years old) will complete five times sit to stand test in < 15 seconds indicating an increased LE strength and improved balance.    Baseline  19.38sec 02/11/18    Time  12    Period  Weeks    Status  New    Target Date  04/01/18            Plan - 02/26/18 1323    Clinical Impression Statement  Patient demonstrates improved stability and strength allowing patient to perform short duration standing interventions with rest periods.  Patient performs beginning standing dynamic standing balance exercises to shift weight and perform single leg standing activities with mod assist. Patient fatigues quickly with exercises requiring rest breaks at this time. Patient will continue to benefit from skilled physical therapy to improve pain and mobility.    Rehab Potential  Good    PT Frequency  2x / week    PT Duration  8 weeks    PT Treatment/Interventions  Gait training;Therapeutic exercise;Therapeutic activities;Functional mobility training;Neuromuscular re-education;Patient/family education;Balance training    PT Home Exercise Plan  HEP    Consulted and Agree with Plan of Care  Patient       Patient  will benefit from skilled therapeutic intervention in order to improve the following deficits and impairments:  Abnormal gait, Decreased balance, Decreased endurance, Postural dysfunction, Impaired flexibility, Decreased strength, Decreased safety awareness, Decreased activity tolerance, Decreased range of motion, Decreased knowledge of precautions, Impaired sensation, Difficulty walking  Visit Diagnosis: Muscle weakness (generalized)  Other lack of coordination  Difficulty in walking, not elsewhere classified     Problem List Patient Active Problem List   Diagnosis Date Noted  . Lower extremity edema 04/15/2017  . Macrocytic anemia 02/11/2017  . Vitamin D deficiency 02/02/2016  . Mixed hyperlipidemia 01/03/2015  . MS (multiple sclerosis) ([place]) 01/03/2015  . Chronic pain 01/03/2015  . GERD without esophagitis 01/03/2015  . COPD (chronic obstructive pulmonary disease) ([place]) 01/03/2015  . Osteopenia 01/03/2015  . Neurogenic bladder 01/03/2015  . Incontinence in female 01/03/2015    Alanson Puls, PT DPT 02/26/2018, 1:25 PM  [place] MAIN [facility] SERVICES [address] [place], [place], [address] Phone: [phone]   Fax:  [phone]  Name: [name] MRN: [id number] Date of Birth: September 23, 1955

## 2018-03-02 ENCOUNTER — Ambulatory Visit: Payer: Medicare Other | Admitting: Occupational Therapy

## 2018-03-02 ENCOUNTER — Encounter: Payer: Self-pay | Admitting: Occupational Therapy

## 2018-03-02 DIAGNOSIS — R262 Difficulty in walking, not elsewhere classified: Secondary | ICD-10-CM | POA: Diagnosis not present

## 2018-03-02 DIAGNOSIS — M6281 Muscle weakness (generalized): Secondary | ICD-10-CM

## 2018-03-02 DIAGNOSIS — R278 Other lack of coordination: Secondary | ICD-10-CM | POA: Diagnosis not present

## 2018-03-02 NOTE — Therapy (Signed)
[place] [facility] MAIN [facility] SERVICES [address] [place], [place], [address] Phone: [phone]   Fax:  [phone]  Occupational Therapy Treatment  Patient Details  Name: [name] MRN: [id number] Date of Birth: Nov 19, 1955 Referring Provider: Morene Crocker   Encounter Date: 03/02/2018  OT End of Session - 03/02/18 1203    Visit Number  28    Date for OT Re-Evaluation  04/13/18    Authorization Type  Visit 9 of 10 for progress report period starting 01/21/2018    OT Start Time  1150    OT Stop Time  1230    OT Time Calculation (min)  40 min    Equipment Utilized During Treatment  adaptive pens    Activity Tolerance  Patient tolerated treatment well    Behavior During Therapy  [facility] for tasks assessed/performed       Past Medical History:  Diagnosis Date  . Headache   . Hyperlipidemia   . MS (multiple sclerosis) (HCC) 1990  . Neurogenic bladder     Past Surgical History:  Procedure Laterality Date  . ABDOMINAL HYSTERECTOMY    . OOPHORECTOMY Bilateral     There were no vitals filed for this visit.  Subjective Assessment - 03/02/18 1151    Subjective   Pt. reports that things are ack to normal with her caregiver.     Pertinent History  Patient reports she was diagnosed with MS years ago and has recently started to feel weak in her left dominant arm and hand.  She denies any previous therapy and reports memory issues and can provide a limited history.     Patient Stated Goals  Patient reports she wants to be able to walk better, use her left hand and be able to write again.     Currently in Pain?  Yes    Pain Score  4     Pain Location  Leg    Pain Orientation  Left    Pain Descriptors / Indicators  Aching    Pain Type  Chronic pain    Pain Onset  More than a month ago    Pain Frequency  Constant      OT TREATMENT    Neuro muscular re-education:  Pt. worked on grasping heavy duty resistive magnets of varying sizes. Pt.  worked on placing them on a whiteboard positioned at a vertical angle on the tabletop, on an elevated surface to encourage [facility] while UEs are elevated. Pt. worked on Human resources officer with resistance. Pt. worked on [facility] grasping 1/2" small magnetic pegs, disconnecting them, and placing them on a positioned at a vertical angle at a tabletop, on an elevated angle to encourage [facility] skills with arms sustained in elevation. Pt. worked on removing the pegs while alternating thumb opposition to the tip of the 2nd through 5th digits. Pt. worked on sorting the objects by color requiring occasional cues with less cuing required today. Pt. continues to require cues to engage her LUE. Pt. worked on writing tasks while assessing pen grip needs. Pt. wrote her name with 75% legibility.                         OT Education - 03/02/18 1201    Education provided  Yes    Education Details  fine motor coordination, sorting  primary colors    Person(s) Educated  Patient    Methods  Explanation;Demonstration;Verbal cues  Comprehension  Verbal cues required;Returned demonstration;Verbalized understanding          OT [place] Term Goals - 01/19/18 1401      OT [place] TERM GOAL #1   Title  Patient will demonstrate holding utensils in her left hand and feeding herself with modified independence with minimal spillage.      Baseline  01/19/2018: Pt. requires minA    Time  8    Period  Weeks    Status  On-going    Target Date  04/13/18      OT Hazel TERM GOAL #2   Title  Patient wil complete tub/shower transfer with supervision only.    Baseline  min assist at eval     Time  8    Period  Weeks    Status  On-going    Target Date  04/13/18      OT Blyth TERM GOAL #4   Title  Patient will increase left UE strength by 1 mm grade to be able to reach into closet to retrieve clothing items.     Baseline  Pt. conitnues to require assist from aide, 3+/5 strength at eval    Time  12    Period   Weeks    Status  On-going    Target Date  04/13/18      OT [place] TERM GOAL #5   Title  Patient will improve coordination on the left to be able to hold pen securely and write her name with 75% legibility.     Baseline  Pt. continues to present with decreased legibility with printing her name.     Time  12    Period  Weeks    Status  On-going    Target Date  04/13/18      OT [place] TERM GOAL #6   Title  Patient will complete lower body dressing with modified independence using adaptive equipment if needed.     Baseline  Pt. continues to require minA able to get pants on, difficulty with tying shoes 6/19    Time  12    Period  Weeks    Status  On-going    Target Date  04/13/18            Plan - 03/02/18 1210    Clinical Impression Statement  Pt. continues to require verbal cues to engage her LUE, and hand during activities, and tasks. Pt. required less cuing today. Pt. continues to require verbal cues for sorting the the magnetic objects by color. Pt. continues to work on Careers information officer when formulating her name. Pen grip needs were assessed, as pt. reports she lost the pen.provided to her.    Occupational Profile and client history currently impacting functional performance  patient is legally blind, progressive disease process with MS for over 20+ years, lives alone, limited availability for transportation.      Occupational performance deficits (Please refer to evaluation for details):  ADL's;Leisure    Rehab Potential  Good    OT Frequency  2x / week    OT Duration  12 weeks    OT Treatment/Interventions  Therapeutic exercise;Cognitive remediation/compensation;Functional Mobility Training    Plan  Positive:  motivation, family support.  Negative:  lives alone, legally blind, requires caregiver 7 days a week for a few hours a day for self care    Clinical Decision Making  Several treatment options, min-mod task modification necessary    Consulted and Agree with Plan of Care  Patient       Patient will benefit from skilled therapeutic intervention in order to improve the following deficits and impairments:  Decreased cognition, Decreased knowledge of use of DME, Impaired vision/preception, Pain, Decreased coordination, Decreased activity tolerance, Decreased endurance, Decreased strength, Decreased balance, Impaired UE functional use  Visit Diagnosis: Muscle weakness (generalized)  Other lack of coordination    Problem List Patient Active Problem List   Diagnosis Date Noted  . Lower extremity edema 04/15/2017  . Macrocytic anemia 02/11/2017  . Vitamin D deficiency 02/02/2016  . Mixed hyperlipidemia 01/03/2015  . MS (multiple sclerosis) (HCC) 01/03/2015  . Chronic pain 01/03/2015  . GERD without esophagitis 01/03/2015  . COPD (chronic obstructive pulmonary disease) (HCC) 01/03/2015  . Osteopenia 01/03/2015  . Neurogenic bladder 01/03/2015  . Incontinence in female 01/03/2015    Olegario Messier, MS, OTR/L 03/02/2018, 10:23 PM  [place] [facility] MAIN [facility] SERVICES [address] [place], [place], [address] Phone: [phone]   Fax:  [phone]  Name: [name] MRN: [id number] Date of Birth: March 06, 1956

## 2018-03-04 ENCOUNTER — Encounter: Payer: Self-pay | Admitting: Occupational Therapy

## 2018-03-04 ENCOUNTER — Ambulatory Visit: Payer: Medicare Other

## 2018-03-04 ENCOUNTER — Ambulatory Visit: Payer: Medicare Other | Admitting: Occupational Therapy

## 2018-03-04 DIAGNOSIS — R278 Other lack of coordination: Secondary | ICD-10-CM | POA: Diagnosis not present

## 2018-03-04 DIAGNOSIS — M6281 Muscle weakness (generalized): Secondary | ICD-10-CM

## 2018-03-04 DIAGNOSIS — R262 Difficulty in walking, not elsewhere classified: Secondary | ICD-10-CM | POA: Diagnosis not present

## 2018-03-04 NOTE — Therapy (Signed)
[facility] MAIN [facility] SERVICES [address] [place], [place], [address] Phone: [phone]   Fax:  [phone]  Occupational Therapy Progress Note  Dates of reporting period  01/21/2018  to   03/04/2018  Patient Details  Name: [name] MRN: [id number] Date of Birth: 06-Nov-1955 Referring Provider: Morene Crocker   Encounter Date: 03/04/2018  OT End of Session - 03/04/18 1120    Visit Number  29    Number of Visits  48    Date for OT Re-Evaluation  04/13/18    Authorization Type  Visit 10 of 10 for progress report period starting 01/21/2018    OT Start Time  1100    OT Stop Time  1145    OT Time Calculation (min)  45 min    Equipment Utilized During Treatment  adaptive pens    Activity Tolerance  Patient tolerated treatment well    Behavior During Therapy  WFL for tasks assessed/performed       Past Medical History:  Diagnosis Date  . Headache   . Hyperlipidemia   . MS (multiple sclerosis) (HCC) 1990  . Neurogenic bladder     Past Surgical History:  Procedure Laterality Date  . ABDOMINAL HYSTERECTOMY    . OOPHORECTOMY Bilateral     There were no vitals filed for this visit.  Subjective Assessment - 03/04/18 1118    Subjective   Pt. reports that she is doing well today.    Pertinent History  Patient reports she was diagnosed with MS years ago and has recently started to feel weak in her left dominant arm and hand.  She denies any previous therapy and reports memory issues and can provide a limited history.     Patient Stated Goals  Patient reports she wants to be able to walk better, use her left hand and be able to write again.     Currently in Pain?  Yes    Pain Score  4     Pain Location  Leg    Pain Orientation  Left    Pain Descriptors / Indicators  Aching    Pain Onset  More than a month ago       OT TREATMENT  Measurements were obtained, and goals were reviewed with the patient.   Neuro muscular  re-education:  Pt. worked on grasping 2" pegs, and placing them on a jumbo [employer] following design patterns. Pt. worked on simple to Development worker, community patterns. Pt. worked on Scientist, research (medical), and Health visitor. Pt. worked on simple to Production manager. Pt. Required increased precision for to lay the shapes on the tabletop flat onto the designs. Pt. was unable to pick out the shapes.    Therapeutic Exercise:  Selfcare:  Manual Therapy:      [facility] OT Assessment - 03/04/18 1131      Coordination   Left 9 Hole Peg Test  42 sec.      Strength   Overall Strength Comments LUE strength 4-/5 overall      Hand Function   Left Hand Grip (lbs)  25    Left Hand Lateral Pinch  13 lbs    Left 3 point pinch  9 lbs                       OT Education - 03/04/18 1119    Education provided  Yes    Education Details  fine motor  coordination, sorting  primary colors    Person(s) Educated  Patient    Methods  Explanation;Demonstration;Verbal cues    Comprehension  Verbal cues required;Returned demonstration;Verbalized understanding          OT [place] Term Goals - 03/04/18 1144      OT Chronis TERM GOAL #1   Title  Patient will demonstrate holding utensils in her left hand and feeding herself with modified independence with minimal spillage.      Baseline  01/19/2018: Independent with using a spoon, and fork, Pt. requires assit from he caregiver for cutting, and slicing food.    Time  8    Period  Weeks    Status  On-going    Target Date  04/12/18      OT G[place] TERM GOAL #2   Title  Patient wil complete tub/shower transfer with supervision only.    Baseline  min assist     Time  8    Period  Weeks    Status  On-going    Target Date  04/13/18      OT Mailloux TERM GOAL #4   Title  Patient will increase left UE strength by 1 mm grade to be able to reach into closet to retrieve clothing items.     Baseline  Pt. LUE strength increased to 4-/5.  Pt. is now able to reach items in her closet independently    Time  12    Period  Weeks    Status  On-going    Target Date  04/13/18      OT Jobe TERM GOAL #5   Title  Patient will improve coordination on the left to be able to hold pen securely and write her name with 75% legibility.     Baseline  Pt. continues to present with decreased legibility with printing her name.     Time  12    Period  Weeks    Status  On-going    Target Date  04/13/18      OT Fiero TERM GOAL #6   Title  Patient will complete lower body dressing with modified independence using adaptive equipment if needed.     Baseline  Pt. continues to require minA able to get pants on, pt. to continue working on her tying shoes     Time  12    Period  Weeks    Status  On-going    Target Date  04/13/18            Plan - 03/04/18 1714    Clinical Impression Statement Pt. is making progress with LUE strength, grip strength, FMC coordination skills, ADL , and self care tasks. Pt. has improved with donning pants, and using utensils. Pt. conitnues to need assist with tying her shoes. Pt. continues to work on improving UE strength, and [facility] skills for improved engagement in ADL, and IADLs.    Occupational Profile and client history currently impacting functional performance  patient is legally blind, progressive disease process with MS for over 20+ years, lives alone, limited availability for transportation.      Occupational performance deficits (Please refer to evaluation for details):  ADL's;Leisure    Rehab Potential  Good    OT Frequency  2x / week    OT Duration  12 weeks    OT Treatment/Interventions  Therapeutic exercise;Cognitive remediation/compensation;Functional Mobility Training    Plan  Positive:  motivation, family support.  Negative:  lives alone, legally blind, requires caregiver  7 days a week for a few hours a day for self care    Clinical Decision Making  Several treatment options, min-mod task  modification necessary    Consulted and Agree with Plan of Care  Patient       Patient will benefit from skilled therapeutic intervention in order to improve the following deficits and impairments:  Decreased cognition, Decreased knowledge of use of DME, Impaired vision/preception, Pain, Decreased coordination, Decreased activity tolerance, Decreased endurance, Decreased strength, Decreased balance, Impaired UE functional use  Visit Diagnosis: Muscle weakness (generalized)  Other lack of coordination    Problem List Patient Active Problem List   Diagnosis Date Noted  . Lower extremity edema 04/15/2017  . Macrocytic anemia 02/11/2017  . Vitamin D deficiency 02/02/2016  . Mixed hyperlipidemia 01/03/2015  . MS (multiple sclerosis) (HCC) 01/03/2015  . Chronic pain 01/03/2015  . GERD without esophagitis 01/03/2015  . COPD (chronic obstructive pulmonary disease) (HCC) 01/03/2015  . Osteopenia 01/03/2015  . Neurogenic bladder 01/03/2015  . Incontinence in female 01/03/2015    Olegario Messier, MS, OTR/L 03/04/2018, 5:18 PM  [facility] MAIN [facility] SERVICES [address] [place], [place], [address] Phone: [phone]   Fax:  [phone]  Name: [name] MRN: [id number] Date of Birth: May 18, 1956

## 2018-03-04 NOTE — Therapy (Signed)
[place] MAIN [facility] SERVICES [address] [place], [place], [address] Phone: [phone]   Fax:  [phone]  Physical Therapy Treatment  Patient Details  Name: [name] MRN: [id number] Date of Birth: [birth year] Referring Provider: Anabel Bene   Encounter Date: 03/04/2018  PT End of Session - 03/04/18 1022    Visit Number  13    Number of Visits  25    Date for PT Re-Evaluation  04/01/18    PT Start Time  1975    PT Stop Time  1100    PT Time Calculation (min)  45 min    Equipment Utilized During Treatment  Gait belt    Activity Tolerance  Patient tolerated treatment well    Behavior During Therapy  [facility] for tasks assessed/performed       Past Medical History:  Diagnosis Date  . Headache   . Hyperlipidemia   . MS (multiple sclerosis) ([place]) 1990  . Neurogenic bladder     Past Surgical History:  Procedure Laterality Date  . ABDOMINAL HYSTERECTOMY    . OOPHORECTOMY Bilateral     There were no vitals filed for this visit.  Subjective Assessment - 03/04/18 1019    Subjective  Patient reports her left leg continues to be painful all the time. State she was very tired after last session but in a good way. Is eager to participate in therapy today.     Pertinent History  Patient reports about 3 months ago she started to feel weak in the left arm, reports she is legally blind.       How [place] can you stand comfortably?  not too Mara     Patient Stated Goals  Patient wants to be able to walk better.     Currently in Pain?  Yes    Pain Score  4     Pain Location  Leg    Pain Orientation  Left    Pain Descriptors / Indicators  Aching    Pain Type  Chronic pain    Pain Onset  More than a month ago    Pain Frequency  Constant       Nustep 3 minutes with RPM>60 for cardiovascular challenge.     Standing in // bars:  Half foam roller:  Rock back and forth on half foam roller with flat side up: BUE support decreased to  SUE, cues for reducing speed of movement and increasing amplitude of movement. Unable to perform without UE support.     Horizontal head turns with SUE support and CGA.    Step over and back orange hurdle with limited UE support ; cues for decreasing speed to improve stability. 10x each leg, frequent knocking over hurdle  Sit to stands: 10x; CGA ; 3 episodes of LOB upon eccentric portion of sit to stand. Verbal cues for forward momentum to improve quality of movement. ; initially require SUE support, reduced support required with repetition. Occasional use of chair to maintain stability. 2 sets;   Airex pad: CGA: slight forward trunk motion ; occasional LOB 30 second holds; 3 trials   Ambulate in hallway with rollator and CGA: 200 ft with cues for hip flexion and step length.   Ambulate backwards 60 ft with CGA with rollator  and min cueing for task orientation/continuation.   Ambulate with horizontal head turns with PT directing what direction to turn head: 200 ft. CGA with rollator  Ambulate 100 ft with cueing  for speeding up and slowing down with CGA and rollater . Difficulty slowing gait speed down  Navigate/weave between 4 cones with rollator 8x: demonstration with verbal and tactile cueing. Knock over one cone   Patient requires occasional cues for task orientation and reducing velocity of movement to improve quality of movement.   Patient use of rollator reinforced for reduced fall risk between sessions.   Seated rest breaks required between standing interventions due to poor capacity for prolonged standing.   Pt. response to medical necessity:  Patient will continue to benefit from skilled physical therapy to improve pain and mobility.                    PT Education - 03/04/18 1022    Education Details  balance, exercise technique, stability,     Person(s) Educated  Patient    Methods  Explanation;Demonstration;Verbal cues    Comprehension  Verbalized  understanding;Returned demonstration       PT Short Term Goals - 02/11/18 1602      PT SHORT TERM GOAL #1   Title  Patient will be independent in home exercise program to improve strength/mobility for better functional independence with ADLs.    Time  6    Period  Weeks    Status  On-going    Target Date  02/18/18      PT SHORT TERM GOAL #2   Title  nt (< 25 years old) will complete five times sit to stand test in < 10 seconds indicating an increased LE strength and improved balance.    Baseline  02/11/18= 19.38    Time  6    Period  Weeks    Status  On-going    Target Date  02/18/18        PT Carnero Term Goals - 02/11/18 1546      PT Daffin TERM GOAL #1   Title  Patient will increase six minute walk test distance to >1000 for progression to community ambulator and improve gait ability    Baseline  02/11/18= 500 feet     Time  12    Period  Weeks    Status  New    Target Date  04/01/18      PT [place] TERM GOAL #2   Title  Patient will increase 10 meter walk test to >1.71ms as to improve gait speed for better community ambulation and to reduce fall risk.    Baseline  02/11/18 . 68 m/sec    Time  12    Period  Weeks    Status  New    Target Date  04/01/18      PT Poke TERM GOAL #3   Title  Patient will reduce timed up and go to <11 seconds to reduce fall risk and demonstrate improved transfer/gait ability.    Baseline  17.49 sec 02/11/18    Time  12    Period  Weeks    Status  Partially Met    Target Date  04/01/18      PT Shammas TERM GOAL #4   Title  Patient (> 61years old) will complete five times sit to stand test in < 15 seconds indicating an increased LE strength and improved balance.    Baseline  19.38sec 02/11/18    Time  12    Period  Weeks    Status  New    Target Date  04/01/18  Plan - 03/04/18 1037    Clinical Impression Statement  Patient demonstrates improved coordination and muscle recruitment when she slows down her movements. Patient  demonstrates poor eccentric control of sit to stand, losing balance and requiring UE support/ CGA . Patient's left leg has noted decrease in strength with limited weight acceptance. Patient will continue to benefit from skilled physical therapy to improve pain and mobility.    Rehab Potential  Good    PT Frequency  2x / week    PT Duration  8 weeks    PT Treatment/Interventions  Gait training;Therapeutic exercise;Therapeutic activities;Functional mobility training;Neuromuscular re-education;Patient/family education;Balance training    PT Home Exercise Plan  HEP    Consulted and Agree with Plan of Care  Patient       Patient will benefit from skilled therapeutic intervention in order to improve the following deficits and impairments:  Abnormal gait, Decreased balance, Decreased endurance, Postural dysfunction, Impaired flexibility, Decreased strength, Decreased safety awareness, Decreased activity tolerance, Decreased range of motion, Decreased knowledge of precautions, Impaired sensation, Difficulty walking  Visit Diagnosis: Muscle weakness (generalized)  Other lack of coordination  Difficulty in walking, not elsewhere classified     Problem List Patient Active Problem List   Diagnosis Date Noted  . Lower extremity edema 04/15/2017  . Macrocytic anemia 02/11/2017  . Vitamin D deficiency 02/02/2016  . Mixed hyperlipidemia 01/03/2015  . MS (multiple sclerosis) ([place]) 01/03/2015  . Chronic pain 01/03/2015  . GERD without esophagitis 01/03/2015  . COPD (chronic obstructive pulmonary disease) ([place]) 01/03/2015  . Osteopenia 01/03/2015  . Neurogenic bladder 01/03/2015  . Incontinence in female 01/03/2015   Erick Blinks, SPT  This entire session was performed under direct supervision and direction of a licensed therapist/therapist assistant . I have personally read, edited and approve of the note as written.  Janna Arch, PT, DPT   03/04/2018, 11:14 AM  [place] MAIN [facility] SERVICES [address] [place], [place], [address] Phone: [phone]   Fax:  [phone]  Name: BREEANA SAWTELLE MRN: [id number] Date of Birth: 26-Jul-1955

## 2018-03-09 ENCOUNTER — Ambulatory Visit: Payer: Medicare Other | Admitting: Occupational Therapy

## 2018-03-09 ENCOUNTER — Encounter: Payer: Self-pay | Admitting: Physical Therapy

## 2018-03-09 ENCOUNTER — Ambulatory Visit: Payer: Medicare Other | Admitting: Physical Therapy

## 2018-03-09 ENCOUNTER — Encounter: Payer: Self-pay | Admitting: Occupational Therapy

## 2018-03-09 DIAGNOSIS — R262 Difficulty in walking, not elsewhere classified: Secondary | ICD-10-CM

## 2018-03-09 DIAGNOSIS — R278 Other lack of coordination: Secondary | ICD-10-CM

## 2018-03-09 DIAGNOSIS — M6281 Muscle weakness (generalized): Secondary | ICD-10-CM

## 2018-03-09 NOTE — Therapy (Addendum)
[place] MAIN [facility] SERVICES [address] [place], [place], [address] Phone: [phone]   Fax:  [phone]  Physical Therapy Treatment  Patient Details  Name: [name] MRN: [id number] Date of Birth: Dec 29, 1955 Referring Provider: Anabel Bene   Encounter Date: 03/09/2018  PT End of Session - 03/09/18 1026    Visit Number  14    Number of Visits  25    Date for PT Re-Evaluation  04/01/18    PT Start Time  0938    PT Stop Time  1100    PT Time Calculation (min)  45 min    Equipment Utilized During Treatment  Gait belt    Activity Tolerance  Patient tolerated treatment well    Behavior During Therapy  [facility] for tasks assessed/performed       Past Medical History:  Diagnosis Date  . Headache   . Hyperlipidemia   . MS (multiple sclerosis) ([place]) 1990  . Neurogenic bladder     Past Surgical History:  Procedure Laterality Date  . ABDOMINAL HYSTERECTOMY    . OOPHORECTOMY Bilateral     There were no vitals filed for this visit.  Subjective Assessment - 03/09/18 1024    Subjective  Patient reports her left leg continues to be painful; had a good weekend with friends. Pt denies any new falls.     Pertinent History  Patient reports about 3 months ago she started to feel weak in the left arm, reports she is legally blind.       How [place] can you stand comfortably?  not too [place]     Patient Stated Goals  Patient wants to be able to walk better.     Currently in Pain?  Yes    Pain Score  4     Pain Location  Leg    Pain Orientation  Left    Pain Descriptors / Indicators  Aching    Pain Type  Chronic pain    Pain Onset  More than a month ago    Pain Frequency  Constant    Multiple Pain Sites  No        Treatment Warm up on Nustep level 2 x4 min with VCs to maintain SPM over 60 for better cardiorespiratory and endurance training  1/2 foam roll, flat side up: -AP tilts x1 min, CGA for safety, VCs for avoiding just leaning  the trunk and producing movement at the ankles to work on ankle strategy as well as decreasing UE support -Static stance without UE support x1 min, CGA for safety, VCs for shifting weight forward to avoid posterior trunk lean -Tandem stance x30 sec holds with each foot in rear, intermittent UE support, CGA-min A for stability, VCs for utilizing ankles and weight shift to maintain balance  Forwards/backwards ambulation in // bars x3 laps without UE support, CGA for safety, VCs for upright posture particularly when walking backwards and maintaining increased step length  Side stepping in // bars x3 laps without UE support, VCs to keep toes pointing forwards, CGA for safety  Lunges onto BOSU ball x10 reps each leg, CGA-min A for safety, VCs for shifting weight and maintaining upright posture, required RUE support to maintain balance as well   Static stance on BOSU ball x30 sec hold without UE support, CGA-min A for safety, VCs for shifting weight forwards when posteriorly leaning as well as maintaining upright posture  Heel raises on BOSU ball x15 reps bilaterally with  RUE support, CGA for safety, VCs for keeping hips in line with shoulders and not sticking bottom out when returning to neutral stance  Toe taps onto 4 inch step x10 reps each leg, faded UE support, CGA-min A for safety with VCs for shifting weight when tapping with each foot and maintaining upright posture  Step ups onto 4 inch step with UE support x10 reps, CGA for safety, VCs for lifting foot up high enough to clear the step and not leaning too far forwards when stepping up  Step ups onto 4 inch step without UE support x10 reps, CGA-min A for stability, VCs for stepping up with the RLE first and following with the LLE  Airex pad in // bars:  -Static stance, wide BOS, no UE support x30 sec hold, CGA for safety, VCs to avoid leaning to the L and shifting weight back to center  -Static stance, narrow BOS, no UE support x1 min hold,  CGA-min A for stability and assist with weight shift back to the right, used mirror for visual cuing to maintain upright posture and avoid lean to L   Balloon passes standing on hard surface in // bars x1 min bouts x2 bouts, supervision for safety, VCs for utilizing the L and R hands and shifting weight to reach outside the BOS to hit the balloon     PT Education - 03/09/18 1025    Education Details  balance, gait safety, exercise technique    Person(s) Educated  Patient    Methods  Explanation;Demonstration;Verbal cues    Comprehension  Verbalized understanding;Returned demonstration;Verbal cues required;Need further instruction       PT Short Term Goals - 02/11/18 1602      PT SHORT TERM GOAL #1   Title  Patient will be independent in home exercise program to improve strength/mobility for better functional independence with ADLs.    Time  6    Period  Weeks    Status  On-going    Target Date  02/18/18      PT SHORT TERM GOAL #2   Title  nt (< 94 years old) will complete five times sit to stand test in < 10 seconds indicating an increased LE strength and improved balance.    Baseline  02/11/18= 19.38    Time  6    Period  Weeks    Status  On-going    Target Date  02/18/18        PT [place] Term Goals - 02/11/18 1546      PT Wiechmann TERM GOAL #1   Title  Patient will increase six minute walk test distance to >1000 for progression to community ambulator and improve gait ability    Baseline  02/11/18= 500 feet     Time  12    Period  Weeks    Status  New    Target Date  04/01/18      PT [place] TERM GOAL #2   Title  Patient will increase 10 meter walk test to >1.32ms as to improve gait speed for better community ambulation and to reduce fall risk.    Baseline  02/11/18 . 68 m/sec    Time  12    Period  Weeks    Status  New    Target Date  04/01/18      PT Wigal TERM GOAL #3   Title  Patient will reduce timed up and go to <11 seconds to reduce fall risk and demonstrate improved  transfer/gait ability.    Baseline  17.49 sec 02/11/18    Time  12    Period  Weeks    Status  Partially Met    Target Date  04/01/18      PT [place] TERM GOAL #4   Title  Patient (> 37 years old) will complete five times sit to stand test in < 15 seconds indicating an increased LE strength and improved balance.    Baseline  19.38sec 02/11/18    Time  12    Period  Weeks    Status  New    Target Date  04/01/18            Plan - 03/09/18 1118    Clinical Impression Statement  Patient demonstrates improved coordination and activity tolerance with no standing rest breaks throughout session. Pt demonstrated improvement in ability to weight shift when standing on one leg as evidenced in step ups and toe taps; required CGA-min A for stability in all standing activities with VCs for proper form and technique. Pt demonstrated posterior lean when standing on compliant surfaces but was able to correct with VCs and tactile cuing to continue to shift weight fowrads. Pt demonstrated ability to reach outside BOS with BUE in balloon passes; VCs to encourage BUE use. Pt will continue to benefit from skilled physical therapy to improve balance, strength, gait safety, and pain.     Rehab Potential  Good    PT Frequency  2x / week    PT Duration  8 weeks    PT Treatment/Interventions  Gait training;Therapeutic exercise;Therapeutic activities;Functional mobility training;Neuromuscular re-education;Patient/family education;Balance training    PT Home Exercise Plan  HEP    Consulted and Agree with Plan of Care  Patient       Patient will benefit from skilled therapeutic intervention in order to improve the following deficits and impairments:  Abnormal gait, Decreased balance, Decreased endurance, Postural dysfunction, Impaired flexibility, Decreased strength, Decreased safety awareness, Decreased activity tolerance, Decreased range of motion, Decreased knowledge of precautions, Impaired sensation, Difficulty  walking  Visit Diagnosis: Muscle weakness (generalized)  Difficulty in walking, not elsewhere classified     Problem List Patient Active Problem List   Diagnosis Date Noted  . Lower extremity edema 04/15/2017  . Macrocytic anemia 02/11/2017  . Vitamin D deficiency 02/02/2016  . Mixed hyperlipidemia 01/03/2015  . MS (multiple sclerosis) ([place]) 01/03/2015  . Chronic pain 01/03/2015  . GERD without esophagitis 01/03/2015  . COPD (chronic obstructive pulmonary disease) ([place]) 01/03/2015  . Osteopenia 01/03/2015  . Neurogenic bladder 01/03/2015  . Incontinence in female 01/03/2015   Harriet Masson, SPT This entire session was performed under direct supervision and direction of a licensed therapist/therapist assistant . I have personally read, edited and approve of the note as written.  Trotter,Margaret  PT, DPT 03/09/2018, 1:48 PM  [place] MAIN [facility] SERVICES [address] [place], [place], [address] Phone: [phone]   Fax:  [phone]  Name: [name] MRN: [id number] Date of Birth: Dec 09, 1955

## 2018-03-09 NOTE — Therapy (Addendum)
[place] [facility] MAIN [facility] SERVICES [address] [place], [place], [address] Phone: [phone]   Fax:  [phone]  Occupational Therapy Treatment  Patient Details  Name: [name] MRN: [id number] Date of Birth: [birth year] Referring Provider: Morene Crocker   Encounter Date: 03/09/2018  OT End of Session - 03/09/18 1331    Visit Number  30    Number of Visits  48    Date for OT Re-Evaluation  04/13/18    Authorization Type  Visit 1 of 10 for progress report staring 03/09/2018    OT Start Time  1100    OT Stop Time  1145    OT Time Calculation (min)  45 min    Activity Tolerance  Patient tolerated treatment well    Behavior During Therapy  [facility] for tasks assessed/performed       Past Medical History:  Diagnosis Date  . Headache   . Hyperlipidemia   . MS (multiple sclerosis) (HCC) 1990  . Neurogenic bladder     Past Surgical History:  Procedure Laterality Date  . ABDOMINAL HYSTERECTOMY    . OOPHORECTOMY Bilateral     There were no vitals filed for this visit.  Subjective Assessment - 03/09/18 1324    Subjective   Pt reports she is doing well today and that she had a good weekend    Pertinent History  Patient reports she was diagnosed with MS years ago and has recently started to feel weak in her left dominant arm and hand.  She denies any previous therapy and reports memory issues and can provide a limited history.     Patient Stated Goals  Patient reports she wants to be able to walk better, use her left hand and be able to write again.     Currently in Pain?  Yes    Pain Score  4     Pain Location  Leg    Pain Orientation  Left    Pain Descriptors / Indicators  Aching    Pain Type  Chronic pain    Pain Onset  More than a month ago      OT Treatment  Neuromuscular re-education:   Pt completed [facility] task for the L hand. Pt demonstrated decreased L hand coordination when reaching to place 1/2" flat washers at eye-level on  hooks on a vertical surface. Pt demonstrated decreased coordination while opposing 4th and 5th digit to thumb to grasp 1/2" flat washers. Pt used magnetic dish to stabilize washers during tasks. Pt completed [facility] activity that required her to sort small objects by color. Pt required increased verbal cuing to sort objects correctly. As the activity progressed, Pt required increased cuing to use L hand and not allow R hand to help too much. Towards the end of the activity, pt demonstrated signs of fatigue including dropping >4 small objects during the tasks.  Self-care:   Pt completed writing task that required her to write a grocery list. Pt was unable to write list without a sample. Pt required increased time to complete writing sample. Pt noted not being able to see. Pt required the use of a large magnifying glass and has increased difficulty staying oriented to place on page when looking from sample to paper. Pt used pen with a larger grip and demonstrated increased stability of writing tool. Pt demonstrated ~50% legibility of writing sample.   Therapeutic exercise:  Pt completed task that required her to utilize increased pinch strength to  remove and replace 1" cubes against velcro resistance on a vertical surface to promote wrist extension. Pt used 2nd and 3rd digits opposed to thumb to remove 1" cubes against resistance. Pt alternated using 2nd and 3rd digits isolated to secure cubes after replacing them.                  OT Education - 03/09/18 1330    Education provided  Yes    Education Details  fine motor coordination, hand strengthening, sorting primary colors    Person(s) Educated  Patient    Methods  Explanation;Demonstration;Verbal cues    Comprehension  Verbalized understanding;Verbal cues required          OT Takeda Term Goals - 03/09/18 1525      OT Devargas TERM GOAL #1   Title  Patient will demonstrate holding utensils in her left hand and feeding herself with  modified independence with minimal spillage.      Baseline  01/19/2018: Independent with using a spoon, and fork, Pt. requires assit from he caregiver for cutting, and slicing food.    Time  8    Period  Weeks    Status  On-going    Target Date  04/12/18      OT Yebra TERM GOAL #2   Title  Patient wil complete tub/shower transfer with supervision only.    Baseline  min assist     Time  8    Period  Weeks    Status  On-going    Target Date  04/13/18      OT Benjamin TERM GOAL #3   Title  Patient will complete bathing UB and LB with supervision only.     Baseline  min assist at eval; modified independent on 12-10-17    Time  12    Period  Weeks    Status  Achieved    Target Date  01/23/18      OT Eshbach TERM GOAL #4   Title  Patient will increase left UE strength by 1 mm grade to be able to reach into closet to retrieve clothing items.     Baseline  Pt. LUE strength increased to 4-/5. Pt. is now able to reach items in her closet independently    Time  12    Period  Weeks    Status  On-going    Target Date  04/13/18      OT Harden TERM GOAL #5   Title  Patient will improve coordination on the left to be able to hold pen securely and write her name with 75% legibility.     Baseline  Pt. continues to present with decreased legibility with printing her name.     Time  12    Period  Weeks    Status  On-going    Target Date  04/13/18      OT Samons TERM GOAL #6   Title  Patient will complete lower body dressing with modified independence using adaptive equipment if needed.     Baseline  Pt. continues to require minA able to get pants on, pt. to continue working on her tying shoes     Time  12    Period  Weeks    Status  On-going    Target Date  04/13/18           Plan - 03/09/18 1334    Clinical Impression Statement  Pt continues to make progress with LUE coordination, strength, and grip  strength. Pt requires increased verbal cuing due to visual deficits during tasks. Pt. Required  increased visual cues for navigating through the gym today. Pt to continue to improving LUE strength and [facility] skills for increased independence and engagement during ADLs and IADLs.    Occupational Profile and client history currently impacting functional performance  patient is legally blind, progressive disease process with MS for over 20+ years, lives alone, limited availability for transportation.      Occupational performance deficits (Please refer to evaluation for details):  ADL's;Leisure    Rehab Potential  Good    OT Frequency  2x / week    OT Duration  12 weeks    OT Treatment/Interventions  Therapeutic exercise;Cognitive remediation/compensation;Functional Mobility Training    Plan  Positive:  motivation, family support.  Negative:  lives alone, legally blind, requires caregiver 7 days a week for a few hours a day for self care    Clinical Decision Making  Several treatment options, min-mod task modification necessary    Consulted and Agree with Plan of Care  Patient        Patient will benefit from skilled therapeutic intervention in order to improve the following deficits and impairments:  Decreased cognition, Decreased knowledge of use of DME, Impaired vision/preception, Pain, Decreased coordination, Decreased activity tolerance, Decreased endurance, Decreased strength, Decreased balance, Impaired UE functional use  Visit Diagnosis: Muscle weakness (generalized)  Other lack of coordination    Problem List Patient Active Problem List   Diagnosis Date Noted  . Lower extremity edema 04/15/2017  . Macrocytic anemia 02/11/2017  . Vitamin D deficiency 02/02/2016  . Mixed hyperlipidemia 01/03/2015  . MS (multiple sclerosis) (HCC) 01/03/2015  . Chronic pain 01/03/2015  . GERD without esophagitis 01/03/2015  . COPD (chronic obstructive pulmonary disease) (HCC) 01/03/2015  . Osteopenia 01/03/2015  . Neurogenic bladder 01/03/2015  . Incontinence in female 01/03/2015    Ernesto Rutherford OTS 03/09/2018, 3:29 PM  This entire session was performed under direct supervision and direction of a licensed therapist/therapist assistant . I have personally read, edited and approve of the note as written. Olegario Messier, MS, OTR/L  [facility] MAIN [facility] SERVICES [address] [place], [place], [address] Phone: [phone]   Fax:  [phone]  Name: [name] MRN: [id number] Date of Birth: Aug 12, 1955

## 2018-03-11 ENCOUNTER — Encounter: Payer: Self-pay | Admitting: Physical Therapy

## 2018-03-11 ENCOUNTER — Encounter: Payer: Self-pay | Admitting: Occupational Therapy

## 2018-03-11 ENCOUNTER — Ambulatory Visit: Payer: Medicare Other | Admitting: Physical Therapy

## 2018-03-11 ENCOUNTER — Ambulatory Visit: Payer: Medicare Other | Admitting: Occupational Therapy

## 2018-03-11 DIAGNOSIS — M6281 Muscle weakness (generalized): Secondary | ICD-10-CM

## 2018-03-11 DIAGNOSIS — R278 Other lack of coordination: Secondary | ICD-10-CM

## 2018-03-11 DIAGNOSIS — R262 Difficulty in walking, not elsewhere classified: Secondary | ICD-10-CM | POA: Diagnosis not present

## 2018-03-11 NOTE — Therapy (Signed)
[place] MAIN [facility] SERVICES [address] [place], [place], [address] Phone: [phone]   Fax:  [phone]  Physical Therapy Treatment  Patient Details  Name: [name] MRN: [id number] Date of Birth: 1955-09-13 Referring Provider: Anabel Bene   Encounter Date: 03/11/2018  PT End of Session - 03/11/18 1440    Visit Number  15    Number of Visits  25    Date for PT Re-Evaluation  04/01/18    PT Start Time  0230    PT Stop Time  0312    PT Time Calculation (min)  42 min    Equipment Utilized During Treatment  Gait belt    Activity Tolerance  Patient tolerated treatment well    Behavior During Therapy  [facility] for tasks assessed/performed       Past Medical History:  Diagnosis Date  . Headache   . Hyperlipidemia   . MS (multiple sclerosis) ([place]) 1990  . Neurogenic bladder     Past Surgical History:  Procedure Laterality Date  . ABDOMINAL HYSTERECTOMY    . OOPHORECTOMY Bilateral     There were no vitals filed for this visit.  Subjective Assessment - 03/11/18 1438    Subjective  Patient reports her left leg continues to be painful;     Pertinent History  Patient reports about 3 months ago she started to feel weak in the left arm, reports she is legally blind.       How Ogan can you stand comfortably?  not too [place]     Patient Stated Goals  Patient wants to be able to walk better.     Currently in Pain?  Yes    Pain Score  3     Pain Location  Leg    Pain Orientation  Left    Pain Descriptors / Indicators  Aching    Pain Type  Chronic pain    Pain Onset  More than a month ago    Aggravating Factors   movement    Pain Relieving Factors  nothing    Effect of Pain on Daily Activities  none    Multiple Pain Sites  No       Neuromuscular Re-education  Airex balance with toe taps to 6" step alternating LE x 10 each, faded UE support , continues to need UE support 75%  Static balance on 1/2 bolster (flat side up)  with  UE support 30 s x 2  Airex pad  Head turns  x2 min, CGA for safety, demonstrated difficulty with catching the ball on its return and required VCs to control the speed of toss to control the return speed  Airex pad, and head turns  x2 min, supervision for safety , VCs for utilizing both hands and minimizing UE support  BOSU ball lunges with single UE support x 15 BLE , cues and modeling / demonstration for instructions    TM walking bwd with min assist x 5 mins . 8 miles / hour   side stepping and bwd walking x 5 mins   CGA and Min to mod verbal cues used throughout with increased in postural sway and LOB most seen with narrow base of support and while on uneven surfaces. Continues to have balance deficits typical with diagnosis. Patient performs intermediate level exercises without pain behaviors and needs verbal cuing for postural alignment and head positioning  PT Education - 03/11/18 1439    Education Details  HEP, safety with AD    Person(s) Educated  Patient    Methods  Explanation    Comprehension  Verbalized understanding;Returned demonstration;Need further instruction       PT Short Term Goals - 02/11/18 1602      PT SHORT TERM GOAL #1   Title  Patient will be independent in home exercise program to improve strength/mobility for better functional independence with ADLs.    Time  6    Period  Weeks    Status  On-going    Target Date  02/18/18      PT SHORT TERM GOAL #2   Title  nt (< 68 years old) will complete five times sit to stand test in < 10 seconds indicating an increased LE strength and improved balance.    Baseline  02/11/18= 19.38    Time  6    Period  Weeks    Status  On-going    Target Date  02/18/18        PT Castelli Term Goals - 02/11/18 1546      PT Vorce TERM GOAL #1   Title  Patient will increase six minute walk test distance to >1000 for progression to community ambulator and improve gait  ability    Baseline  02/11/18= 500 feet     Time  12    Period  Weeks    Status  New    Target Date  04/01/18      PT Pusey TERM GOAL #2   Title  Patient will increase 10 meter walk test to >1.3ms as to improve gait speed for better community ambulation and to reduce fall risk.    Baseline  02/11/18 . 68 m/sec    Time  12    Period  Weeks    Status  New    Target Date  04/01/18      PT Reish TERM GOAL #3   Title  Patient will reduce timed up and go to <11 seconds to reduce fall risk and demonstrate improved transfer/gait ability.    Baseline  17.49 sec 02/11/18    Time  12    Period  Weeks    Status  Partially Met    Target Date  04/01/18      PT Calmes TERM GOAL #4   Title  Patient (> 648years old) will complete five times sit to stand test in < 15 seconds indicating an increased LE strength and improved balance.    Baseline  19.38sec 02/11/18    Time  12    Period  Weeks    Status  New    Target Date  04/01/18            Plan - 03/11/18 1440    Clinical Impression Statement  Pt responded well to all interventions today.  Focus on core and hip strengthening in order to improve balance and ambulation.  Pt attempted hip ABD with weight and single leg bridges against gravity, but was unable to perform against resistance of gravity secondary to weakness.  Pt was able to complete core exercises, requiring cueing for proper form and hip alignment during seated and supine core activities. Pt would continue to benefit from skilled therapy services in order to improve LE and core strength, gait and functional mobility in order to decrease risk of falls and improve independence with mobility.    Rehab Potential  Good  PT Frequency  2x / week    PT Duration  8 weeks    PT Treatment/Interventions  Gait training;Therapeutic exercise;Therapeutic activities;Functional mobility training;Neuromuscular re-education;Patient/family education;Balance training    PT Home Exercise Plan  HEP     Consulted and Agree with Plan of Care  Patient       Patient will benefit from skilled therapeutic intervention in order to improve the following deficits and impairments:  Abnormal gait, Decreased balance, Decreased endurance, Postural dysfunction, Impaired flexibility, Decreased strength, Decreased safety awareness, Decreased activity tolerance, Decreased range of motion, Decreased knowledge of precautions, Impaired sensation, Difficulty walking  Visit Diagnosis: Muscle weakness (generalized)  Other lack of coordination  Difficulty in walking, not elsewhere classified     Problem List Patient Active Problem List   Diagnosis Date Noted  . Lower extremity edema 04/15/2017  . Macrocytic anemia 02/11/2017  . Vitamin D deficiency 02/02/2016  . Mixed hyperlipidemia 01/03/2015  . MS (multiple sclerosis) ([place]) 01/03/2015  . Chronic pain 01/03/2015  . GERD without esophagitis 01/03/2015  . COPD (chronic obstructive pulmonary disease) ([place]) 01/03/2015  . Osteopenia 01/03/2015  . Neurogenic bladder 01/03/2015  . Incontinence in female 01/03/2015    Alanson Puls, PT DPT 03/11/2018, 2:41 PM  [place] MAIN [facility] SERVICES [address] [place], [place], [address] Phone: [phone]   Fax:  [phone]  Name: [name] MRN: [id number] Date of Birth: June 09, 1956

## 2018-03-11 NOTE — Therapy (Addendum)
[place] [facility] MAIN [facility] SERVICES [address] [place], [place], [address] Phone: [phone]   Fax:  [phone]  Occupational Therapy Treatment  Patient Details  Name: [name] MRN: [id number] Date of Birth: 25-Aug-1955 Referring Provider: Morene Crocker   Encounter Date: 03/11/2018  OT End of Session - 03/11/18 1448    Visit Number  31    Number of Visits  48    Date for OT Re-Evaluation  04/13/18    Authorization Type  Visit 2 of 10 for progress report staring 03/09/2018    OT Start Time  1345    OT Stop Time  1430    OT Time Calculation (min)  45 min    Activity Tolerance  Patient tolerated treatment well    Behavior During Therapy  [facility] for tasks assessed/performed       Past Medical History:  Diagnosis Date  . Headache   . Hyperlipidemia   . MS (multiple sclerosis) (HCC) 1990  . Neurogenic bladder     Past Surgical History:  Procedure Laterality Date  . ABDOMINAL HYSTERECTOMY    . OOPHORECTOMY Bilateral     There were no vitals filed for this visit.  Subjective Assessment - 03/11/18 1445    Subjective   Pt reports she is doing well today    Pertinent History  Patient reports she was diagnosed with MS years ago and has recently started to feel weak in her left dominant arm and hand.  She denies any previous therapy and reports memory issues and can provide a limited history.     Patient Stated Goals  Patient reports she wants to be able to walk better, use her left hand and be able to write again.     Currently in Pain?  Yes    Pain Score  3     Pain Location  Leg    Pain Orientation  Left    Pain Descriptors / Indicators  Aching    Pain Type  Chronic pain    Pain Onset  More than a month ago    Pain Frequency  Constant      OT TREATMENT  Neuromuscular re-education:  Pt completed [facility] activity that required her to place small paquetry blocks on a designs. Complexity of the designs increased as they activity  progressed. Pt was unable to pick the correct shapes from the box containing all of the pieces. Pt was able to select the correct shapes with minimal verbal cueing from a sample of 10-12 shapes. Pt required increased time to form designs, especially with increased complexity. Pt had difficulty precisely placing the shapes on the design. Pt demonstrated increased precision with slower movements, however when pt. Became frustrated she moved faster, and required verbal cues to slow down. Pt. Reported that she liked working with the [employer] Pt completed handwriting activity that required her to copy words from a writing sample and formulate a written response. Pt required increased time and had difficulty staying oriented on page when looking from writing sample to paper. Pt required assistance with spelling when formulating responses. Pt was able to write on unlined paper with minimal deviations from the line. Pt used a standard pen was a larger Tripod grip. Pt's legibility decreased with increased writing speed.                     OT Education - 03/11/18 1446    Education provided  Yes  Education Details  fine motor coordination, hand writing    Person(s) Educated  Patient    Methods  Explanation;Demonstration;Verbal cues    Comprehension  Verbalized understanding;Verbal cues required          OT Dillard Term Goals - 03/09/18 1525      OT Jaggi TERM GOAL #1   Title  Patient will demonstrate holding utensils in her left hand and feeding herself with modified independence with minimal spillage.      Baseline  01/19/2018: Independent with using a spoon, and fork, Pt. requires assit from he caregiver for cutting, and slicing food.    Time  8    Period  Weeks    Status  On-going    Target Date  04/12/18      OT Vandeberg TERM GOAL #2   Title  Patient wil complete tub/shower transfer with supervision only.    Baseline  min assist     Time  8    Period  Weeks    Status   On-going    Target Date  04/13/18      OT Saxton TERM GOAL #3   Title  Patient will complete bathing UB and LB with supervision only.     Baseline  min assist at eval; modified independent on 12-10-17    Time  12    Period  Weeks    Status  Achieved    Target Date  01/23/18      OT Russum TERM GOAL #4   Title  Patient will increase left UE strength by 1 mm grade to be able to reach into closet to retrieve clothing items.     Baseline  Pt. LUE strength increased to 4-/5. Pt. is now able to reach items in her closet independently    Time  12    Period  Weeks    Status  On-going    Target Date  04/13/18      OT Gu TERM GOAL #5   Title  Patient will improve coordination on the left to be able to hold pen securely and write her name with 75% legibility.     Baseline  Pt. continues to present with decreased legibility with printing her name.     Time  12    Period  Weeks    Status  On-going    Target Date  04/13/18      OT Medine TERM GOAL #6   Title  Patient will complete lower body dressing with modified independence using adaptive equipment if needed.     Baseline  Pt. continues to require minA able to get pants on, pt. to continue working on her tying shoes     Time  12    Period  Weeks    Status  On-going    Target Date  04/13/18            Plan - 03/11/18 1448    Clinical Impression Statement  Pt continues to make progress with LUE coordination, strength, and grip strength. Pt requires increased verbal cuing due to visual deficits during tasks. Pt. was able to inconsistently read words. Pt. to continue to improve LUE strength and [facility] skills for increased independence and engagement during ADLs and IADLs.    Occupational Profile and client history currently impacting functional performance  patient is legally blind, progressive disease process with MS for over 20+ years, lives alone, limited availability for transportation.      Occupational performance deficits (Please  refer  to evaluation for details):  ADL's;Leisure    Rehab Potential  Good    OT Frequency  2x / week    OT Duration  12 weeks    OT Treatment/Interventions  Therapeutic exercise;Cognitive remediation/compensation;Functional Mobility Training    Plan  Positive:  motivation, family support.  Negative:  lives alone, legally blind, requires caregiver 7 days a week for a few hours a day for self care    Clinical Decision Making  Several treatment options, min-mod task modification necessary    Consulted and Agree with Plan of Care  Patient       Patient will benefit from skilled therapeutic intervention in order to improve the following deficits and impairments:  Decreased cognition, Decreased knowledge of use of DME, Impaired vision/preception, Pain, Decreased coordination, Decreased activity tolerance, Decreased endurance, Decreased strength, Decreased balance, Impaired UE functional use  Visit Diagnosis: Other lack of coordination  Muscle weakness (generalized)    Problem List Patient Active Problem List   Diagnosis Date Noted  . Lower extremity edema 04/15/2017  . Macrocytic anemia 02/11/2017  . Vitamin D deficiency 02/02/2016  . Mixed hyperlipidemia 01/03/2015  . MS (multiple sclerosis) (HCC) 01/03/2015  . Chronic pain 01/03/2015  . GERD without esophagitis 01/03/2015  . COPD (chronic obstructive pulmonary disease) (HCC) 01/03/2015  . Osteopenia 01/03/2015  . Neurogenic bladder 01/03/2015  . Incontinence in female 01/03/2015    Ernesto Rutherford, OTS 03/11/2018, 2:52 PM   This entire session was performed under direct supervision and direction of a licensed therapist/therapist assistant . I have personally read, edited and approve of the note as written.   Olegario Messier, MS, OTR/L    [place] [facility] MAIN [facility] SERVICES [address] [place], [place], [address] Phone: [phone]   Fax:  [phone]  Name: KEHLANI VANCAMP MRN: [id number] Date  of Birth: July 12, 1955

## 2018-03-16 ENCOUNTER — Ambulatory Visit: Payer: Medicare Other | Admitting: Occupational Therapy

## 2018-03-16 ENCOUNTER — Ambulatory Visit: Payer: Medicare Other

## 2018-03-16 ENCOUNTER — Encounter: Payer: Self-pay | Admitting: Occupational Therapy

## 2018-03-16 ENCOUNTER — Other Ambulatory Visit: Payer: Self-pay | Admitting: Family Medicine

## 2018-03-16 VITALS — BP 120/72 | HR 65

## 2018-03-16 DIAGNOSIS — R262 Difficulty in walking, not elsewhere classified: Secondary | ICD-10-CM | POA: Diagnosis not present

## 2018-03-16 DIAGNOSIS — N319 Neuromuscular dysfunction of bladder, unspecified: Secondary | ICD-10-CM

## 2018-03-16 DIAGNOSIS — M6281 Muscle weakness (generalized): Secondary | ICD-10-CM

## 2018-03-16 DIAGNOSIS — R278 Other lack of coordination: Secondary | ICD-10-CM

## 2018-03-16 DIAGNOSIS — R32 Unspecified urinary incontinence: Secondary | ICD-10-CM

## 2018-03-16 NOTE — Therapy (Signed)
[facility] MA[facility] REHAB SERVICES [address] [place], [place], [address] Phone: [phone]   Fax:  [phone]  Physical Therapy Treatment  Patient Details  Name: [name] MRN: [id number] Date of Birth: 03/30/1956 Referring Provider: POTTER, ZACHARY E   Encounter Date: 03/16/2018  PT End of Session - 03/16/18 1520    Visit Number  16    Number of Visits  25    Date for PT Re-Evaluation  04/01/18    PT Start Time  1516    PT Stop Time  1600    PT Time Calculation (min)  44 min    Equipment Utilized During Treatment  Gait belt    Activity Tolerance  Patient tolerated treatment well    Behavior During Therapy  WFL for tasks assessed/performed       Past Medical History:  Diagnosis Date  . Headache   . Hyperlipidemia   . MS (multiple sclerosis) (HCC) 1990  . Neurogenic bladder     Past Surgical History:  Procedure Laterality Date  . ABDOMINAL HYSTERECTOMY    . OOPHORECTOMY Bilateral     Vitals:   03/16/18 1518  BP: 120/72  Pulse: 65  SpO2: 100%    Subjective Assessment - 03/16/18 1518    Subjective  Patient reports her left leg continues to be painful. She rates her pain as a 2-3/10 today. HEP is going well at home. No specific questions or concerns at this time.     Pertinent History  Patient reports about 3 months ago she started to feel weak in the left arm, reports she is legally blind.       How Iannuzzi can you stand comfortably?  not too Rolin     Patient Stated Goals  Patient wants to be able to walk better.     Currently in Pain?  Yes    Pain Score  3     Pain Location  Leg    Pain Orientation  Left    Pain Descriptors / Indicators  Aching    Pain Type  Chronic pain    Pain Onset  More than a month ago          [facility] PT Assessment - 03/16/18 1533      6 Minute Walk- Baseline   6 Minute Walk- Baseline  yes    BP (mmHg)  120/72    HR (bpm)  65    02 Sat (%RA)  100 %    Modified Borg Scale for Dyspnea  0-  Nothing at all    Perceived Rate of Exertion (Borg)  6-      6 Minute walk- Post Test   6 Minute Walk Post Test  yes    BP (mmHg)  129/68    HR (bpm)  65    02 Sat (%RA)  --   Unable to get reading   Modified Borg Scale for Dyspnea  0- Nothing at all    Perceived Rate of Exertion (Borg)  8-         TREATMENT  Neuromuscular Re-education Performed outcome measures with patient: 5TSTS: 18.0s; TUG: 16.8s 10m gait speed: self-selected: 12.6s= 0.79 m/s fastest: 10.4s = 0.96 m/s 6MWT: 830' Reviewed outcome measures and updated goals with patient; Airex balance with toe taps to 6" step alternating LE x 10 each, without UE support but occasional minA+1 from therapist; Step-ups from Airex to 6" step alternating LE x 10 each side Airex balance with horizontal   ball passes around body x 10 each direction, CGA for safety, demonstrated difficulty with body turns; Sidestepping and retro ambulation in // bars without UE support, intermittent minA+1 due to LOB;  Pt educated throughout session about proper posture and technique with exercises. Improved exercise technique, movement at target joints, use of target muscles after min to mod verbal, visual, tactile cues.   Pt demonstrates excellent motivation during session today. Her 14mgait speed, 6MWT, TUG, and 5TSTS all improved today. She is able to complete all exercises as instructed with difficulty with balance on Airex pad. Pt encouraged to continue HEP and follow-up as scheduled. Pt would continue to benefit from skilled therapy services in order to improve LE and core strength, gait and functional mobility in order to decrease risk of falls and improve independence with mobility.                        PT Short Term Goals - 03/16/18 1521      PT SHORT TERM GOAL #1   Title  Patient will be independent in home exercise program to improve strength/mobility for better functional independence with ADLs.    Time  6    Period   Weeks    Status  On-going      PT SHORT TERM GOAL #2   Title  nt (< 659years old) will complete five times sit to stand test in < 10 seconds indicating an increased LE strength and improved balance.    Baseline  02/11/18= 19.38; 03/16/18: 18.0s    Time  6    Period  Weeks    Status  On-going        PT Norenberg Term Goals - 03/16/18 1523      PT Dufresne TERM GOAL #1   Title  Patient will increase six minute walk test distance to >1000 for progression to community ambulator and improve gait ability    Baseline  02/11/18= 500 feet; 03/16/18: 830 feet    Time  12    Period  Weeks    Status  Partially Met    Target Date  04/01/18      PT Kappes TERM GOAL #2   Title  Patient will increase 10 meter walk test to >1.083m as to improve gait speed for better community ambulation and to reduce fall risk.    Baseline  02/11/18 . 68 m/sec; 03/16/18: self-selected: 12.6s= 0.79 m/s fastest: 10.4s = 0.96 m/s    Time  12    Period  Weeks    Status  Partially Met    Target Date  04/01/18      PT Tewksbury TERM GOAL #3   Title  Patient will reduce timed up and go to <11 seconds to reduce fall risk and demonstrate improved transfer/gait ability.    Baseline  17.49 sec 02/11/18; 03/16/18: 16.8s    Time  12    Period  Weeks    Status  Partially Met      PT Zobel TERM GOAL #4   Title  Patient (> 6037ears old) will complete five times sit to stand test in < 15 seconds indicating an increased LE strength and improved balance.    Baseline  19.38sec 02/11/18; 03/16/18: 18.0s    Time  12    Period  Weeks    Status  Partially Met    Target Date  04/01/18            Plan -  03/16/18 1521    Clinical Impression Statement  Pt demonstrates excellent motivation during session today. Her 10m gait speed, 6MWT, TUG, and 5TSTS all improved today. She is able to complete all exercises as instructed with difficulty with balance on Airex pad. Pt encouraged to continue HEP and follow-up as scheduled. Pt would continue to benefit  from skilled therapy services in order to improve LE and core strength, gait and functional mobility in order to decrease risk of falls and improve independence with mobility.     Rehab Potential  Good    PT Frequency  2x / week    PT Duration  8 weeks    PT Treatment/Interventions  Gait training;Therapeutic exercise;Therapeutic activities;Functional mobility training;Neuromuscular re-education;Patient/family education;Balance training    PT Next Visit Plan  strengthening, balance training    PT Home Exercise Plan  HEP    Consulted and Agree with Plan of Care  Patient       Patient will benefit from skilled therapeutic intervention in order to improve the following deficits and impairments:  Abnormal gait, Decreased balance, Decreased endurance, Postural dysfunction, Impaired flexibility, Decreased strength, Decreased safety awareness, Decreased activity tolerance, Decreased range of motion, Decreased knowledge of precautions, Impaired sensation, Difficulty walking  Visit Diagnosis: Muscle weakness (generalized)  Difficulty in walking, not elsewhere classified     Problem List Patient Active Problem List   Diagnosis Date Noted  . Lower extremity edema 04/15/2017  . Macrocytic anemia 02/11/2017  . Vitamin D deficiency 02/02/2016  . Mixed hyperlipidemia 01/03/2015  . MS (multiple sclerosis) (HCC) 01/03/2015  . Chronic pain 01/03/2015  . GERD without esophagitis 01/03/2015  . COPD (chronic obstructive pulmonary disease) (HCC) 01/03/2015  . Osteopenia 01/03/2015  . Neurogenic bladder 01/03/2015  . Incontinence in female 01/03/2015   Jason D Huprich PT, DPT, GCS  Huprich,Jason 03/16/2018, 4:24 PM  [facility] [address] [place], [place], [address] Phone: [phone]   Fax:  [phone]  Name: Annalise B Dula MRN: [id number] Date of Birth: 07/25/1955   

## 2018-03-16 NOTE — Therapy (Addendum)
[facility] [facility] MAIN [facility] SERVICES [address] [place], [place], [address] Phone: [phone]   Fax:  [phone]  Occupational Therapy Treatment  Patient Details  Name: [name] MRN: [id number] Date of Birth: 1956/02/12 Referring Provider: Morene Crocker   Encounter Date: 03/16/2018  OT End of Session - 03/16/18 1441    Visit Number  32    Number of Visits  48    Date for OT Re-Evaluation  04/13/18    Authorization Type  Visit 3 of 10 for progress report staring 03/09/2018    OT Start Time  1430    OT Stop Time  1515    OT Time Calculation (min)  45 min    Activity Tolerance  Patient tolerated treatment well    Behavior During Therapy  [facility] for tasks assessed/performed       Past Medical History:  Diagnosis Date  . Headache   . Hyperlipidemia   . MS (multiple sclerosis) (HCC) 1990  . Neurogenic bladder     Past Surgical History:  Procedure Laterality Date  . ABDOMINAL HYSTERECTOMY    . OOPHORECTOMY Bilateral     There were no vitals filed for this visit.  Subjective Assessment - 03/16/18 1437    Subjective   Pt reports that she is doing well today and that she had and alright weekend    Pertinent History  Patient reports she was diagnosed with MS years ago and has recently started to feel weak in her left dominant arm and hand.  She denies any previous therapy and reports memory issues and can provide a limited history.     Patient Stated Goals  Patient reports she wants to be able to walk better, use her left hand and be able to write again.     Currently in Pain?  Yes    Pain Score  3     Pain Location  Leg    Pain Orientation  Left    Pain Descriptors / Indicators  Aching    Pain Type  Chronic pain    Pain Onset  More than a month ago    Pain Frequency  Constant       OT TREATMENT  Neuromuscular re-education:  Pt completed [facility] task that required her to sort coins of various sizes into stacks of 5 before  placing them into bank. Pt used L hand to slide coins off of table top using a tripod grasp. Pt was encouraged to slow down as she demonstrated increased accuracy with slower movement. Pt was able to incorporate this strategy without cuing for the remainder of the task.Pt completed [facility] task that required her to remove and replace 1" cubes against velcro resistance. Pt alternated between a tripod grasp and a pincer grasp to remove blocks from board. Pt alternated between 2nd and 3rd digit to secure blocks once replaced on board. Velcro board was placed at a vertical angle during task. Pt completed a handwriting task that required her to copy one word at a time from a writing sample and then formulate a one-word written response. Pt used a standard pen with a larger grip and unlined paper during the task. Pt required cuing for spelling and to formulate accurate responses. Pt noted having trouble seeing writing sample.  Therapeutic exercise:  Pt completed grip strengthening task that required her to place clips of various resistances on a designated place on a number line. Pt had limited difficulty with yellow, red, and  green clips, moderate difficulty with blue clips, and increased difficulty with black clips. Pt was able to open/close clips x3 using a 3 point pinch and lateral pinch before removing and replacing from the container.                  OT Education - 03/16/18 1440    Education provided  Yes    Education Details  fine motor coordination, grip strength, and sorting coins    Person(s) Educated  Patient    Methods  Explanation;Demonstration;Verbal cues    Comprehension  Verbalized understanding;Verbal cues required          OT Cenci Term Goals - 03/09/18 1525      OT Backhaus TERM GOAL #1   Title  Patient will demonstrate holding utensils in her left hand and feeding herself with modified independence with minimal spillage.      Baseline  01/19/2018: Independent with using a  spoon, and fork, Pt. requires assit from he caregiver for cutting, and slicing food.    Time  8    Period  Weeks    Status  On-going    Target Date  04/12/18      OT Teehan TERM GOAL #2   Title  Patient wil complete tub/shower transfer with supervision only.    Baseline  min assist     Time  8    Period  Weeks    Status  On-going    Target Date  04/13/18      OT Leibold TERM GOAL #3   Title  Patient will complete bathing UB and LB with supervision only.     Baseline  min assist at eval; modified independent on 12-10-17    Time  12    Period  Weeks    Status  Achieved    Target Date  01/23/18      OT Interrante TERM GOAL #4   Title  Patient will increase left UE strength by 1 mm grade to be able to reach into closet to retrieve clothing items.     Baseline  Pt. LUE strength increased to 4-/5. Pt. is now able to reach items in her closet independently    Time  12    Period  Weeks    Status  On-going    Target Date  04/13/18      OT Oestreicher TERM GOAL #5   Title  Patient will improve coordination on the left to be able to hold pen securely and write her name with 75% legibility.     Baseline  Pt. continues to present with decreased legibility with printing her name.     Time  12    Period  Weeks    Status  On-going    Target Date  04/13/18      OT Prime TERM GOAL #6   Title  Patient will complete lower body dressing with modified independence using adaptive equipment if needed.     Baseline  Pt. continues to require minA able to get pants on, pt. to continue working on her tying shoes     Time  12    Period  Weeks    Status  On-going    Target Date  04/13/18            Plan - 03/16/18 1446    Clinical Impression Statement  Pt continues to make progress in L hand [facility] skills, grip strength, and handwriting. Pt has improved [facility] skills with  L hand and continues to develop strategies such as slowing down during tasks to improve coordination and precision. Pt continues to initiate more  with L hand. Pt's handwriting legibility continues to improve. Pt required increased cuing for spelling and staying oriented on page.    Occupational Profile and client history currently impacting functional performance  patient is legally blind, progressive disease process with MS for over 20+ years, lives alone, limited availability for transportation.      Occupational performance deficits (Please refer to evaluation for details):  ADL's;Leisure    Rehab Potential  Good    OT Frequency  2x / week    OT Duration  12 weeks    OT Treatment/Interventions  Therapeutic exercise;Cognitive remediation/compensation;Functional Mobility Training    Plan  Positive:  motivation, family support.  Negative:  lives alone, legally blind, requires caregiver 7 days a week for a few hours a day for self care    Clinical Decision Making  Several treatment options, min-mod task modification necessary    Consulted and Agree with Plan of Care  Patient       Patient will benefit from skilled therapeutic intervention in order to improve the following deficits and impairments:  Decreased cognition, Decreased knowledge of use of DME, Impaired vision/preception, Pain, Decreased coordination, Decreased activity tolerance, Decreased endurance, Decreased strength, Decreased balance, Impaired UE functional use  Visit Diagnosis: Other lack of coordination  Muscle weakness (generalized)    Problem List Patient Active Problem List   Diagnosis Date Noted  . Lower extremity edema 04/15/2017  . Macrocytic anemia 02/11/2017  . Vitamin D deficiency 02/02/2016  . Mixed hyperlipidemia 01/03/2015  . MS (multiple sclerosis) (HCC) 01/03/2015  . Chronic pain 01/03/2015  . GERD without esophagitis 01/03/2015  . COPD (chronic obstructive pulmonary disease) (HCC) 01/03/2015  . Osteopenia 01/03/2015  . Neurogenic bladder 01/03/2015  . Incontinence in female 01/03/2015    Ernesto Rutherford, OTS 03/16/2018, 4:13 PM  This entire  session was performed under direct supervision and direction of a licensed therapist/therapist assistant . I have personally read, edited and approve of the note as written.  Olegario Messier, MS, OTR/L  [place] [facility] MAIN [facility] SERVICES [address] [place], [place], [address] Phone: [phone]   Fax:  [phone]  Name: [name] MRN: [id number] Date of Birth: [birth year]

## 2018-03-18 ENCOUNTER — Ambulatory Visit: Payer: Medicare Other | Admitting: Physical Therapy

## 2018-03-18 ENCOUNTER — Encounter: Payer: Self-pay | Admitting: Physical Therapy

## 2018-03-18 ENCOUNTER — Ambulatory Visit: Payer: Medicare Other | Admitting: Occupational Therapy

## 2018-03-18 ENCOUNTER — Encounter: Payer: Self-pay | Admitting: Occupational Therapy

## 2018-03-18 ENCOUNTER — Telehealth: Payer: Self-pay | Admitting: Family Medicine

## 2018-03-18 DIAGNOSIS — R262 Difficulty in walking, not elsewhere classified: Secondary | ICD-10-CM

## 2018-03-18 DIAGNOSIS — R278 Other lack of coordination: Secondary | ICD-10-CM

## 2018-03-18 DIAGNOSIS — M6281 Muscle weakness (generalized): Secondary | ICD-10-CM | POA: Diagnosis not present

## 2018-03-18 NOTE — Therapy (Addendum)
[facility] MAIN [facility] SERVICES [address] [place], [place], [address] Phone: [phone]   Fax:  [phone]  Occupational Therapy Treatment  Patient Details  Name: [name] MRN: [id number] Date of Birth: 07-07-1955 Referring Provider: Morene Crocker   Encounter Date: 03/18/2018  OT End of Session - 03/18/18 1437    Visit Number  33    Number of Visits  48    Date for OT Re-Evaluation  04/13/18    Authorization Type  Visit 4 of 10 for progress report staring 03/09/2018    OT Start Time  1345    OT Stop Time  1430    OT Time Calculation (min)  45 min    Activity Tolerance  Patient tolerated treatment well    Behavior During Therapy  [facility] for tasks assessed/performed       Past Medical History:  Diagnosis Date  . Headache   . Hyperlipidemia   . MS (multiple sclerosis) (HCC) 1990  . Neurogenic bladder     Past Surgical History:  Procedure Laterality Date  . ABDOMINAL HYSTERECTOMY    . OOPHORECTOMY Bilateral     There were no vitals filed for this visit.  Subjective Assessment - 03/18/18 1436    Subjective   Pt reports that she is doing well today    Pertinent History  Patient reports she was diagnosed with MS years ago and has recently started to feel weak in her left dominant arm and hand.  She denies any previous therapy and reports memory issues and can provide a limited history.     Patient Stated Goals  Patient reports she wants to be able to walk better, use her left hand and be able to write again.     Currently in Pain?  Yes    Pain Score  3     Pain Location  Leg    Pain Orientation  Left    Pain Descriptors / Indicators  Aching    Pain Type  Chronic pain      OT TREATMENT  Self-care:  Pt completed self-care activities including simulated cutting foods and tying shoes. Pt was able to cut green theraputty using a standard steel kitchen fork and butter knife. Pt held fork in R hand and butter knife in L hand  during the activity. When theraputty was rolled into larger, thicker shapes, pt required increased time and exerted increased effort while cutting. Pt demonstrated decreased coordination as she increased focus and effort to cut through larger pieces. Pt completed shoe tying activity that required her to tie strings of various sizes. Pt required verbal cuing and demonstration to recall steps to tie shoes. Once pt was able to recall step, she was able to tie laces without cuing. Pt x1 trial to tie R shoe while seated in chair. Pt required x3 trials to tie L shoe while seated in chair.   Neuro re-education:  Pt completed [facility] activity that required her to place and remove small pegs onto peg board using her L hand. Pt required increased time and demonstrated decreased coordination as the activity progressed. Pt was cued to decrease the speed of her movement and demonstrated increase coordination using this strategy. Pt dropped x3 pegs during the course of the activity. Pt required increased cuing to oppose thumb to tip of digits 2-5 when picking up the pegs. Prior to cuing, pt approached each peg opposing thumb to pad closest to DIP joint of each digit.  OT Education - 03/18/18 1437    Education provided  Yes    Education Details  fine motor coordination, self-care    Person(s) Educated  Patient    Methods  Explanation;Demonstration;Verbal cues    Comprehension  Verbalized understanding;Verbal cues required;Returned demonstration          OT Mont[place]urro Term Goals - 03/09/18 1525      OT Hebner TERM GOAL #1   Title  Patient will demonstrate holding utensils in her left hand and feeding herself with modified independence with minimal spillage.      Baseline  01/19/2018: Independent with using a spoon, and fork, Pt. requires assit from he caregiver for cutting, and slicing food.    Time  8    Period  Weeks    Status  On-going    Target Date  04/12/18      OT Rico TERM  GOAL #2   Title  Patient wil complete tub/shower transfer with supervision only.    Baseline  min assist     Time  8    Period  Weeks    Status  On-going    Target Date  04/13/18      OT Manna TERM GOAL #3   Title  Patient will complete bathing UB and LB with supervision only.     Baseline  min assist at eval; modified independent on 12-10-17    Time  12    Period  Weeks    Status  Achieved    Target Date  01/23/18      OT Durney TERM GOAL #4   Title  Patient will increase left UE strength by 1 mm grade to be able to reach into closet to retrieve clothing items.     Baseline  Pt. LUE strength increased to 4-/5. Pt. is now able to reach items in her closet independently    Time  12    Period  Weeks    Status  On-going    Target Date  04/13/18      OT Maffei TERM GOAL #5   Title  Patient will improve coordination on the left to be able to hold pen securely and write her name with 75% legibility.     Baseline  Pt. continues to present with decreased legibility with printing her name.     Time  12    Period  Weeks    Status  On-going    Target Date  04/13/18      OT Deems TERM GOAL #6   Title  Patient will complete lower body dressing with modified independence using adaptive equipment if needed.     Baseline  Pt. continues to require minA able to get pants on, pt. to continue working on her tying shoes     Time  12    Period  Weeks    Status  On-going    Target Date  04/13/18            Plan - 03/18/18 1438    Clinical Impression Statement  Pt continues to work on L hand [facility] skills, tying shoes, and cutting food. Pt has improved L grip and pinch strength and is able to cut small, thin pieces of food. Pt continues to initiate more with L hand and requires increased cuing to oppose thumb to tip of other digits when picking up small items.    Occupational Profile and client history currently impacting functional performance  patient is legally blind, progressive  disease process  with MS for over 20+ years, lives alone, limited availability for transportation.      Occupational performance deficits (Please refer to evaluation for details):  ADL's;Leisure    Rehab Potential  Good    OT Frequency  2x / week    OT Duration  12 weeks    OT Treatment/Interventions  Therapeutic exercise;Cognitive remediation/compensation;Functional Mobility Training    Plan  Positive:  motivation, family support.  Negative:  lives alone, legally blind, requires caregiver 7 days a week for a few hours a day for self care    Clinical Decision Making  Several treatment options, min-mod task modification necessary    Consulted and Agree with Plan of Care  Patient       Patient will benefit from skilled therapeutic intervention in order to improve the following deficits and impairments:  Decreased cognition, Decreased knowledge of use of DME, Impaired vision/preception, Pain, Decreased coordination, Decreased activity tolerance, Decreased endurance, Decreased strength, Decreased balance, Impaired UE functional use  Visit Diagnosis: Other lack of coordination  Muscle weakness (generalized)    Problem List Patient Active Problem List   Diagnosis Date Noted  . Lower extremity edema 04/15/2017  . Macrocytic anemia 02/11/2017  . Vitamin D deficiency 02/02/2016  . Mixed hyperlipidemia 01/03/2015  . MS (multiple sclerosis) (HCC) 01/03/2015  . Chronic pain 01/03/2015  . GERD without esophagitis 01/03/2015  . COPD (chronic obstructive pulmonary disease) (HCC) 01/03/2015  . Osteopenia 01/03/2015  . Neurogenic bladder 01/03/2015  . Incontinence in female 01/03/2015    Ernesto Rutherford, OTS 03/18/2018, 2:43 PM   This entire session was performed under direct supervision and direction of a licensed therapist/therapist assistant . I have personally read, edited and approve of the note as written.  Olegario Messier, MS, OTR/L   [place] [facility] MAIN [facility]  SERVICES [address] [place], [place], [address] Phone: [phone]   Fax:  [phone]  Name: GLENNIS BORGER MRN: [id number] Date of Birth: [birth year]

## 2018-03-18 NOTE — Therapy (Signed)
[place] MAIN [facility] SERVICES [address] [place], [place], [address] Phone: [phone]   Fax:  [phone]  Physical Therapy Treatment  Patient Details  Name: [name] MRN: [id number] Date of Birth: 1955/09/21 Referring Provider: Anabel Bene   Encounter Date: 03/18/2018  PT End of Session - 03/18/18 1443    Visit Number  17    Number of Visits  25    Date for PT Re-Evaluation  04/01/18    Authorization Type  7/10    PT Start Time  0230    PT Stop Time  0310    PT Time Calculation (min)  40 min    Equipment Utilized During Treatment  Gait belt    Activity Tolerance  Patient tolerated treatment well    Behavior During Therapy  [facility] for tasks assessed/performed       Past Medical History:  Diagnosis Date  . Headache   . Hyperlipidemia   . MS (multiple sclerosis) ([place]) 1990  . Neurogenic bladder     Past Surgical History:  Procedure Laterality Date  . ABDOMINAL HYSTERECTOMY    . OOPHORECTOMY Bilateral     There were no vitals filed for this visit.  Subjective Assessment - 03/18/18 1441    Subjective  Patient reports her left leg continues to be painful. She rates her pain as a 2-3/10 today. HEP is going well at home. No specific questions or concerns at this time.     Pertinent History  Patient reports about 3 months ago she started to feel weak in the left arm, reports she is legally blind.       How [place] can you stand comfortably?  not too Risk     Patient Stated Goals  Patient wants to be able to walk better.     Currently in Pain?  Yes    Pain Score  3     Pain Location  Leg    Pain Orientation  Left    Pain Descriptors / Indicators  Aching    Pain Type  Chronic pain    Pain Onset  More than a month ago    Pain Frequency  Constant    Aggravating Factors   walking    Pain Relieving Factors  nothing    Effect of Pain on Daily Activities  no effect    Multiple Pain Sites  No       Therapeutic  Exercise:  Leg press 75 lbs x 20 x 2 , heel raises 45 lbs 20 x 2  SAQ with 3 lbs and 3 sec hold x 20 x 2  Bridges x 10 x 2  Hooklying abd/ER with GTB x 20   sidelying hip abd x 15 with 3 lbs BLE  Heel slides with 3 lbs x 10 x 2 BLE:  Floor to 6 inch stool step ups x 20  TM walking 1.0 x 6 mins  Backwards ambulation/ side stepping left and right x 200 feet CGA                        PT Education - 03/18/18 1443    Education Details  HEP, safety with AD    Person(s) Educated  Patient    Methods  Explanation;Demonstration;Tactile cues    Comprehension  Verbalized understanding;Returned demonstration;Need further instruction       PT Short Term Goals - 03/16/18 1521      PT SHORT TERM  GOAL #1   Title  Patient will be independent in home exercise program to improve strength/mobility for better functional independence with ADLs.    Time  6    Period  Weeks    Status  On-going      PT SHORT TERM GOAL #2   Title  nt (< 60 years old) will complete five times sit to stand test in < 10 seconds indicating an increased LE strength and improved balance.    Baseline  02/11/18= 19.38; 03/16/18: 18.0s    Time  6    Period  Weeks    Status  On-going        PT Evola Term Goals - 03/16/18 1523      PT Blocher TERM GOAL #1   Title  Patient will increase six minute walk test distance to >1000 for progression to community ambulator and improve gait ability    Baseline  02/11/18= 500 feet; 03/16/18: 830 feet    Time  12    Period  Weeks    Status  Partially Met    Target Date  04/01/18      PT Ailey TERM GOAL #2   Title  Patient will increase 10 meter walk test to >1.7ms as to improve gait speed for better community ambulation and to reduce fall risk.    Baseline  02/11/18 . 68 m/sec; 03/16/18: self-selected: 12.6s= 0.79 m/s fastest: 10.4s = 0.96 m/s    Time  12    Period  Weeks    Status  Partially Met    Target Date  04/01/18      PT Degeorge TERM GOAL #3    Title  Patient will reduce timed up and go to <11 seconds to reduce fall risk and demonstrate improved transfer/gait ability.    Baseline  17.49 sec 02/11/18; 03/16/18: 16.8s    Time  12    Period  Weeks    Status  Partially Met      PT Dubey TERM GOAL #4   Title  Patient (> 627years old) will complete five times sit to stand test in < 15 seconds indicating an increased LE strength and improved balance.    Baseline  19.38sec 02/11/18; 03/16/18: 18.0s    Time  12    Period  Weeks    Status  Partially Met    Target Date  04/01/18            Plan - 03/18/18 1443    Clinical Impression Statement  Patient demonstrates improved stability and strength allowing patient to perform short duration standing interventions with rest periods. Patient performs beginning standing dynamic standing balance exercises to shift weight and perform single leg standing activities with mod assist. Patient fatigues quickly with exercises requiring rest breaks at this time. Patient will continue to benefit from skilled physical therapy to improve pain and mobility.    Rehab Potential  Good    PT Frequency  2x / week    PT Duration  8 weeks    PT Treatment/Interventions  Gait training;Therapeutic exercise;Therapeutic activities;Functional mobility training;Neuromuscular re-education;Patient/family education;Balance training    PT Next Visit Plan  strengthening, balance training    PT Home Exercise Plan  HEP    Consulted and Agree with Plan of Care  Patient       Patient will benefit from skilled therapeutic intervention in order to improve the following deficits and impairments:  Abnormal gait, Decreased balance, Decreased endurance, Postural dysfunction, Impaired flexibility, Decreased strength,  Decreased safety awareness, Decreased activity tolerance, Decreased range of motion, Decreased knowledge of precautions, Impaired sensation, Difficulty walking  Visit Diagnosis: Other lack of coordination  Muscle  weakness (generalized)  Difficulty in walking, not elsewhere classified     Problem List Patient Active Problem List   Diagnosis Date Noted  . Lower extremity edema 04/15/2017  . Macrocytic anemia 02/11/2017  . Vitamin D deficiency 02/02/2016  . Mixed hyperlipidemia 01/03/2015  . MS (multiple sclerosis) ([place]) 01/03/2015  . Chronic pain 01/03/2015  . GERD without esophagitis 01/03/2015  . COPD (chronic obstructive pulmonary disease) ([place]) 01/03/2015  . Osteopenia 01/03/2015  . Neurogenic bladder 01/03/2015  . Incontinence in female 01/03/2015    Alanson Puls, PT DPT 03/18/2018, 2:45 PM  [place] MAIN [facility] SERVICES [address] [place], [place], [address] Phone: [phone]   Fax:  [phone]  Name: [name] MRN: [id number] Date of Birth: 09/30/1955

## 2018-03-18 NOTE — Telephone Encounter (Signed)
Called to schedule Medicare Annual Wellness Visit with the Nurse Health Advisor.  °Thank you! °For any questions please contact: Kathryn Brown [phone] or Skype at: [email or web address]  ° ° °

## 2018-03-23 ENCOUNTER — Encounter: Payer: Self-pay | Admitting: Occupational Therapy

## 2018-03-23 ENCOUNTER — Ambulatory Visit: Payer: Medicare Other | Admitting: Occupational Therapy

## 2018-03-23 ENCOUNTER — Ambulatory Visit: Payer: Medicare Other | Admitting: Physical Therapy

## 2018-03-23 ENCOUNTER — Encounter: Payer: Self-pay | Admitting: Physical Therapy

## 2018-03-23 DIAGNOSIS — M6281 Muscle weakness (generalized): Secondary | ICD-10-CM | POA: Diagnosis not present

## 2018-03-23 DIAGNOSIS — R262 Difficulty in walking, not elsewhere classified: Secondary | ICD-10-CM | POA: Diagnosis not present

## 2018-03-23 DIAGNOSIS — R278 Other lack of coordination: Secondary | ICD-10-CM | POA: Diagnosis not present

## 2018-03-23 NOTE — Therapy (Addendum)
[facility] MAIN [facility] SERVICES [address] [place], [place], [address] Phone: [phone]   Fax:  [phone]  Occupational Therapy Treatment  Patient Details  Name: [name] MRN: [id number] Date of Birth: 09-06-1955 No data recorded  Encounter Date: 03/23/2018  OT End of Session - 03/23/18 1202    Visit Number  34    Number of Visits  48    Date for OT Re-Evaluation  04/13/18    Authorization Type  Visit 5 of 10 for progress report staring 03/09/2018    OT Start Time  1145    OT Stop Time  1230    OT Time Calculation (min)  45 min    Activity Tolerance  Patient tolerated treatment well    Behavior During Therapy  [facility] for tasks assessed/performed       Past Medical History:  Diagnosis Date  . Headache   . Hyperlipidemia   . MS (multiple sclerosis) ([facility]) 1990  . Neurogenic bladder     Past Surgical History:  Procedure Laterality Date  . ABDOMINAL HYSTERECTOMY    . OOPHORECTOMY Bilateral     There were no vitals filed for this visit.  Subjective Assessment - 03/23/18 1155    Subjective   Pt reports that she is doing well and that she had a great weekend    Pertinent History  Patient reports she was diagnosed with MS years ago and has recently started to feel weak in her left dominant arm and hand.  She denies any previous therapy and reports memory issues and can provide a limited history.     Patient Stated Goals  Patient reports she wants to be able to walk better, use her left hand and be able to write again.     Currently in Pain?  Yes    Pain Score  4     Pain Location  Leg    Pain Orientation  Left    Pain Descriptors / Indicators  Aching    Pain Type  Chronic pain    Pain Onset  More than a month ago    Pain Frequency  Constant       OT TREATMENT  Neuromuscular re-education:  Pt completed [facility] task that required her to sort small magnet onto a vertically placed surface using her L hand. Pt required increased  verbal cuing to sort colors and demonstrated increased difficulty differentiating between similar colors. Pt was encouraged to grasp magnets using the thumb and tip of her 2nd digit instead of the lateral aspect of her 2nd DIP. Pt demonstrated decreased coordination when using the thumb opposed to the tip of her 2nd digit. Pt completed [facility] task that required her to place and remove small pegs into pegboard. Pt was encouraged to use tip of her thumb opposed to the tip of the 2nd digit. Pt required increased time and demonstrated decreased coordination when placing pegs into small holes on pegboard.  Self-care:  Pt completed hand writing task that required her to write her name in print and cursive forms and the date using letters and numbers. Pt was able to print name with ~75% legibility. Pt demonstrate decreased legibility as she increased her writing speed. Pt was unable to sign full name. Pt was able to form less than half of her name using cursive. Pt required increased verbal cuing to write the date using letters and numbers. Pt demonstrated decreased legibility when writing numbers.  OT Education - 03/23/18 1158    Education provided  Yes    Education Details  L hand [facility]    Person(s) Educated  Patient    Methods  Explanation;Demonstration;Verbal cues    Comprehension  Verbalized understanding;Verbal cues required;Returned demonstration          OT Ewing Term Goals - 03/09/18 1525      OT Boomhower TERM GOAL #1   Title  Patient will demonstrate holding utensils in her left hand and feeding herself with modified independence with minimal spillage.      Baseline  01/19/2018: Independent with using a spoon, and fork, Pt. requires assit from he caregiver for cutting, and slicing food.    Time  8    Period  Weeks    Status  On-going    Target Date  04/12/18      OT Strausbaugh TERM GOAL #2   Title  Patient wil complete tub/shower transfer with supervision only.    Baseline   min assist     Time  8    Period  Weeks    Status  On-going    Target Date  04/13/18      OT Holcomb TERM GOAL #3   Title  Patient will complete bathing UB and LB with supervision only.     Baseline  min assist at eval; modified independent on 12-10-17    Time  12    Period  Weeks    Status  Achieved    Target Date  01/23/18      OT Nase TERM GOAL #4   Title  Patient will increase left UE strength by 1 mm grade to be able to reach into closet to retrieve clothing items.     Baseline  Pt. LUE strength increased to 4-/5. Pt. is now able to reach items in her closet independently    Time  12    Period  Weeks    Status  On-going    Target Date  04/13/18      OT Boyson TERM GOAL #5   Title  Patient will improve coordination on the left to be able to hold pen securely and write her name with 75% legibility.     Baseline  Pt. continues to present with decreased legibility with printing her name.     Time  12    Period  Weeks    Status  On-going    Target Date  04/13/18      OT Ditullio TERM GOAL #6   Title  Patient will complete lower body dressing with modified independence using adaptive equipment if needed.     Baseline  Pt. continues to require minA able to get pants on, pt. to continue working on her tying shoes     Time  12    Period  Weeks    Status  On-going    Target Date  04/13/18            Plan - 03/23/18 1213    Clinical Impression Statement  Pt continues to work on L hand [facility] skills and handwriting. Pt has improved [facility] skills and is now able to manipulate smaller items during treatment. Pt is able to incorporate compensatory strategies such as stabilizing with the R hand, decreasing the speed of her movement to increase accuracy, and alloting more time to complete tasks. Pt continues to demonstrate visual deficits such as diffculties differentiating between similar colors.    Occupational Profile and client  history currently impacting functional performance  patient is  legally blind, progressive disease process with MS for over 20+ years, lives alone, limited availability for transportation.      Occupational performance deficits (Please refer to evaluation for details):  ADL's;Leisure    Rehab Potential  Good    OT Frequency  2x / week    OT Duration  12 weeks    OT Treatment/Interventions  Therapeutic exercise;Cognitive remediation/compensation;Functional Mobility Training    Plan  Positive:  motivation, family support.  Negative:  lives alone, legally blind, requires caregiver 7 days a week for a few hours a day for self care    Clinical Decision Making  Several treatment options, min-mod task modification necessary    Consulted and Agree with Plan of Care  Patient       Patient will benefit from skilled therapeutic intervention in order to improve the following deficits and impairments:  Decreased cognition, Decreased knowledge of use of DME, Impaired vision/preception, Pain, Decreased coordination, Decreased activity tolerance, Decreased endurance, Decreased strength, Decreased balance, Impaired UE functional use  Visit Diagnosis: Other lack of coordination    Problem List Patient Active Problem List   Diagnosis Date Noted  . Lower extremity edema 04/15/2017  . Macrocytic anemia 02/11/2017  . Vitamin D deficiency 02/02/2016  . Mixed hyperlipidemia 01/03/2015  . MS (multiple sclerosis) ([facility]) 01/03/2015  . Chronic pain 01/03/2015  . GERD without esophagitis 01/03/2015  . COPD (chronic obstructive pulmonary disease) (H[facility]) 01/03/2015  . Osteopenia 01/03/2015  . Neurogenic bladder 01/03/2015  . Incontinence in female 01/03/2015    Ernesto Rutherford, OTS 03/23/2018, 12:59 PM   This entire session was performed under direct supervision and direction of a licensed therapist/therapist assistant . I have personally read, edited and approve of the note as written.  Olegario Messier, MS, OTR/L   [facility] MAIN [facility]  SERVICES [address] [place], [place], [address] Phone: [phone]   Fax:  [phone]  Name: MARGUETTA WINDISH MRN: [id number] Date of Birth: 1955/12/21

## 2018-03-23 NOTE — Therapy (Addendum)
[place] MAIN [facility] SERVICES [address] [place], [place], [address] Phone: [phone]   Fax:  [phone]  Physical Therapy Treatment  Patient Details  Name: [name] MRN: [id number] Date of Birth: 1956-05-19 Referring Provider (PT): Anabel Bene   Encounter Date: 03/23/2018  PT End of Session - 03/23/18 1056    Visit Number  18    Number of Visits  25    Date for PT Re-Evaluation  04/01/18    Authorization Type  8/10    PT Start Time  1100    PT Stop Time  1145    PT Time Calculation (min)  45 min    Equipment Utilized During Treatment  Gait belt    Activity Tolerance  Patient tolerated treatment well    Behavior During Therapy  WFL for tasks assessed/performed       Past Medical History:  Diagnosis Date  . Headache   . Hyperlipidemia   . MS (multiple sclerosis) ([place]) 1990  . Neurogenic bladder     Past Surgical History:  Procedure Laterality Date  . ABDOMINAL HYSTERECTOMY    . OOPHORECTOMY Bilateral     There were no vitals filed for this visit.  Subjective Assessment - 03/23/18 1101    Subjective  Patient reports pain in her left leg is there all the time; "it never stops; no matter what I do." Pt reports pain as 3-4/10 in left leg. HEP is going "pretty good" at home; reports she tries to do exercises every day.     Pertinent History  Patient reports about 3 months ago she started to feel weak in the left arm, reports she is legally blind.       How [place] can you stand comfortably?  not too [place]y     Patient Stated Goals  Patient wants to be able to walk better.     Pain Score  4     Pain Location  Leg    Pain Orientation  Left    Pain Descriptors / Indicators  Aching    Pain Type  Chronic pain    Pain Onset  More than a month ago    Pain Frequency  Constant    Aggravating Factors   standing still     Pain Relieving Factors  exercise like bicycling at easy speed    Effect of Pain on Daily Activities  no  effect     Multiple Pain Sites  No           Treatment Warm up on Octane level 2 x4 min with VCs to maintain speed above 40 for better cardiovascular challenge  Standing in parallel bars: Standing on airex foam: -alternate toe taps to 4 inch step with 2-1 rail assist x15 reps bilaterally; -Standing one foot on airex, one foot on 4 inch step, BUE ball pass side/side x10 reps each foot on step -Heel/toe raises x15 reps with B rail assist for balance -mini squat unsupported x15 reps with cues for reaching forward for better stance control; -Alternate march with 2-1 rail assist with min Vcs to increase hip flexion for better strength and balance challenge x15 reps bilaterally -modified tandem stance: x30 sec holds with each foot in front, faded UE support  Patient required min VCs for balance stability, including to increase trunk control for less loss of balance with smaller base of support; CGA-min A for all exercises   Gait in hallway:  -Forward walking without AD  x60 ft x3 laps -Backwards walking without AD x60 ft -Side stepping x60 ft without AD leading with each leg  Required CGA for safety with VCs for picking up feet and taking longer steps, VCs to avoid dragging feet for fall prevention and safety   BLE leg press 75# 2x20 reps, 45# heel raises x15 reps; VCs for controlling eccentric return as well as min A for foot placement and positioning on footplate                     PT Education - 03/23/18 1056    Education Details  gait safety, balance    Person(s) Educated  Patient    Methods  Explanation;Demonstration;Verbal cues    Comprehension  Verbalized understanding;Returned demonstration;Verbal cues required;Need further instruction       PT Short Term Goals - 03/16/18 1521      PT SHORT TERM GOAL #1   Title  Patient will be independent in home exercise program to improve strength/mobility for better functional independence with ADLs.    Time  6     Period  Weeks    Status  On-going      PT SHORT TERM GOAL #2   Title  nt (< 23 years old) will complete five times sit to stand test in < 10 seconds indicating an increased LE strength and improved balance.    Baseline  02/11/18= 19.38; 03/16/18: 18.0s    Time  6    Period  Weeks    Status  On-going        PT Brownrigg Term Goals - 03/16/18 1523      PT Solt TERM GOAL #1   Title  Patient will increase six minute walk test distance to >1000 for progression to community ambulator and improve gait ability    Baseline  02/11/18= 500 feet; 03/16/18: 830 feet    Time  12    Period  Weeks    Status  Partially Met    Target Date  04/01/18      PT [place] TERM GOAL #2   Title  Patient will increase 10 meter walk test to >1.79ms as to improve gait speed for better community ambulation and to reduce fall risk.    Baseline  02/11/18 . 68 m/sec; 03/16/18: self-selected: 12.6s= 0.79 m/s fastest: 10.4s = 0.96 m/s    Time  12    Period  Weeks    Status  Partially Met    Target Date  04/01/18      PT Bodley TERM GOAL #3   Title  Patient will reduce timed up and go to <11 seconds to reduce fall risk and demonstrate improved transfer/gait ability.    Baseline  17.49 sec 02/11/18; 03/16/18: 16.8s    Time  12    Period  Weeks    Status  Partially Met      PT Wedin TERM GOAL #4   Title  Patient (> 673years old) will complete five times sit to stand test in < 15 seconds indicating an increased LE strength and improved balance.    Baseline  19.38sec 02/11/18; 03/16/18: 18.0s    Time  12    Period  Weeks    Status  Partially Met    Target Date  04/01/18            Plan - 03/23/18 1205    Clinical Impression Statement  Patient demonstrates improved postural control with dynamic balance tasks and on compliant  surfaces with decreased UE support. Pt does require seated rest breaks between exercises either standing or seated; more often seated following prolonged standing. Pt ambulated without AD in hallway with  CGA; required VCs to remember not to shuffle feet but demonstrated improved foot clearance with prolonged ambulation. Pt will continue to benefit from skilled PT intervention for improvements in pain and mobility.     Rehab Potential  Good    PT Frequency  2x / week    PT Duration  8 weeks    PT Treatment/Interventions  Gait training;Therapeutic exercise;Therapeutic activities;Functional mobility training;Neuromuscular re-education;Patient/family education;Balance training    PT Next Visit Plan  strengthening, balance training    PT Home Exercise Plan  HEP    Consulted and Agree with Plan of Care  Patient       Patient will benefit from skilled therapeutic intervention in order to improve the following deficits and impairments:  Abnormal gait, Decreased balance, Decreased endurance, Postural dysfunction, Impaired flexibility, Decreased strength, Decreased safety awareness, Decreased activity tolerance, Decreased range of motion, Decreased knowledge of precautions, Impaired sensation, Difficulty walking  Visit Diagnosis: Muscle weakness (generalized)  Difficulty in walking, not elsewhere classified     Problem List Patient Active Problem List   Diagnosis Date Noted  . Lower extremity edema 04/15/2017  . Macrocytic anemia 02/11/2017  . Vitamin D deficiency 02/02/2016  . Mixed hyperlipidemia 01/03/2015  . MS (multiple sclerosis) ([place]) 01/03/2015  . Chronic pain 01/03/2015  . GERD without esophagitis 01/03/2015  . COPD (chronic obstructive pulmonary disease) ([place]) 01/03/2015  . Osteopenia 01/03/2015  . Neurogenic bladder 01/03/2015  . Incontinence in female 01/03/2015   Harriet Masson, SPT This entire session was performed under direct supervision and direction of a licensed therapist/therapist assistant . I have personally read, edited and approve of the note as written.  Trotter,Margaret PT, DPT 03/23/2018, 1:28 PM  [place] MAIN [facility]  SERVICES [address] [place], [place], [address] Phone: [phone]   Fax:  [phone]  Name: [name] MRN: [id number] Date of Birth: 10-05-1955

## 2018-03-25 ENCOUNTER — Ambulatory Visit: Payer: Medicare Other | Admitting: Physical Therapy

## 2018-03-25 ENCOUNTER — Ambulatory Visit: Payer: Medicare Other | Admitting: Occupational Therapy

## 2018-03-30 ENCOUNTER — Ambulatory Visit: Payer: Medicare Other | Attending: Neurology | Admitting: Occupational Therapy

## 2018-03-30 ENCOUNTER — Ambulatory Visit: Payer: Medicare Other

## 2018-03-30 ENCOUNTER — Encounter: Payer: Self-pay | Admitting: Occupational Therapy

## 2018-03-30 DIAGNOSIS — R278 Other lack of coordination: Secondary | ICD-10-CM | POA: Diagnosis not present

## 2018-03-30 DIAGNOSIS — R262 Difficulty in walking, not elsewhere classified: Secondary | ICD-10-CM | POA: Diagnosis not present

## 2018-03-30 DIAGNOSIS — M6281 Muscle weakness (generalized): Secondary | ICD-10-CM

## 2018-03-30 NOTE — Therapy (Addendum)
[place] [facility] MAIN [facility] SERVICES [address] [place], [place], [address] Phone: [phone]   Fax:  [phone]  Occupational Therapy Treatment  Patient Details  Name: [name] MRN: [id number] Date of Birth: [birth year] No data recorded  Encounter Date: 03/30/2018  OT End of Session - 03/30/18 1351    Visit Number  35    Number of Visits  48    Date for OT Re-Evaluation  04/13/18    Authorization Type  Visit 6 of 10 for progress report staring 03/09/2018    OT Start Time  1300    OT Stop Time  1345    OT Time Calculation (min)  45 min    Activity Tolerance  Patient tolerated treatment well    Behavior During Therapy  [facility] for tasks assessed/performed       Past Medical History:  Diagnosis Date  . Headache   . Hyperlipidemia   . MS (multiple sclerosis) (HCC) 1990  . Neurogenic bladder     Past Surgical History:  Procedure Laterality Date  . ABDOMINAL HYSTERECTOMY    . OOPHORECTOMY Bilateral     There were no vitals filed for this visit.  Subjective Assessment - 03/30/18 1348    Subjective   Pt reports that she is doing well and that she had a relaxing weekend.    Pertinent History  Patient reports she was diagnosed with MS years ago and has recently started to feel weak in her left dominant arm and hand.  She denies any previous therapy and reports memory issues and can provide a limited history.     Patient Stated Goals  Patient reports she wants to be able to walk better, use her left hand and be able to write again.     Currently in Pain?  Yes    Pain Score  4     Pain Location  Leg    Pain Orientation  Left    Pain Descriptors / Indicators  Aching    Pain Type  Chronic pain    Pain Onset  More than a month ago    Pain Frequency  Constant      OT TREATMENT  Self-care/Handwriting:   Pt completed handwriting task that required her to categorize and write 10 words into 2 categories. Pt required mod verbal cuing for  accuracy of responses. Pt demonstrated ~50% legibility when writing responses in print. Pt demonstrated min deviations from the line and margin. Pt then wrote her name in print and cursive. Pt demonstrated decreased legibility when writing in cursive. Pt used a standard pen with a large grip and unlined paper when writing her name.  The MVPT was administered with the pt as a visual screen. Pt answered 18/36 test items correctly. Pt required increased time for various test items, especially in the visual discrimination, visual memory, and visual closure subtests, taking >60 secs for 5 test items (1:08, 1:13, 1:27, 1:44, 2:08). Throughout the screen administration, pt appeared frustrated and required encouraging to try her best. Pt removed her glasses after completing 24 test items and completed the remaining 12 test items without wearing them.                   OT Education - 03/30/18 1350    Education provided  Yes    Education Details  Visual compensatory strategies, handwriting    Person(s) Educated  Patient    Methods  Explanation;Demonstration;Verbal cues    Comprehension  Verbalized understanding;Verbal cues required;Returned demonstration          OT Randon Term Goals - 03/09/18 1525      OT Bovenzi TERM GOAL #1   Title  Patient will demonstrate holding utensils in her left hand and feeding herself with modified independence with minimal spillage.      Baseline  01/19/2018: Independent with using a spoon, and fork, Pt. requires assit from he caregiver for cutting, and slicing food.    Time  8    Period  Weeks    Status  On-going    Target Date  04/12/18      OT Rinn TERM GOAL #2   Title  Patient wil complete tub/shower transfer with supervision only.    Baseline  min assist     Time  8    Period  Weeks    Status  On-going    Target Date  04/13/18      OT Estey TERM GOAL #3   Title  Patient will complete bathing UB and LB with supervision only.     Baseline  min  assist at eval; modified independent on 12-10-17    Time  12    Period  Weeks    Status  Achieved    Target Date  01/23/18      OT Corpening TERM GOAL #4   Title  Patient will increase left UE strength by 1 mm grade to be able to reach into closet to retrieve clothing items.     Baseline  Pt. LUE strength increased to 4-/5. Pt. is now able to reach items in her closet independently    Time  12    Period  Weeks    Status  On-going    Target Date  04/13/18      OT Soza TERM GOAL #5   Title  Patient will improve coordination on the left to be able to hold pen securely and write her name with 75% legibility.     Baseline  Pt. continues to present with decreased legibility with printing her name.     Time  12    Period  Weeks    Status  On-going    Target Date  04/13/18      OT Rasnic TERM GOAL #6   Title  Patient will complete lower body dressing with modified independence using adaptive equipment if needed.     Baseline  Pt. continues to require minA able to get pants on, pt. to continue working on her tying shoes     Time  12    Period  Weeks    Status  On-going    Target Date  04/13/18            Plan - 03/30/18 1352    Clinical Impression Statement Pt continues to mention deficits in her vision during treatment. Pt continues to work on Surveyor, minerals when writing in print and cursive forms. Pt demonstrates increased legibility when writing her name however pt expresses wanting to improve her legibility when writing other words and names. Pt reports being able to sign her name throughput her day, such as signing in when arriving for therapy.    Occupational Profile and client history currently impacting functional performance  patient is legally blind, progressive disease process with MS for over 20+ years, lives alone, limited availability for transportation.      Occupational performance deficits (Please refer to evaluation for details):  ADL's;Leisure    Rehab  Potential   Good    OT Frequency  2x / week    OT Duration  12 weeks    OT Treatment/Interventions  Therapeutic exercise;Cognitive remediation/compensation;Functional Mobility Training    Plan  Positive:  motivation, family support.  Negative:  lives alone, legally blind, requires caregiver 7 days a week for a few hours a day for self care    Clinical Decision Making  Several treatment options, min-mod task modification necessary    Consulted and Agree with Plan of Care  Patient       Patient will benefit from skilled therapeutic intervention in order to improve the following deficits and impairments:  Decreased cognition, Decreased knowledge of use of DME, Impaired vision/preception, Pain, Decreased coordination, Decreased activity tolerance, Decreased endurance, Decreased strength, Decreased balance, Impaired UE functional use  Visit Diagnosis: Other lack of coordination    Problem List Patient Active Problem List   Diagnosis Date Noted  . Lower extremity edema 04/15/2017  . Macrocytic anemia 02/11/2017  . Vitamin D deficiency 02/02/2016  . Mixed hyperlipidemia 01/03/2015  . MS (multiple sclerosis) (HCC) 01/03/2015  . Chronic pain 01/03/2015  . GERD without esophagitis 01/03/2015  . COPD (chronic obstructive pulmonary disease) (HCC) 01/03/2015  . Osteopenia 01/03/2015  . Neurogenic bladder 01/03/2015  . Incontinence in female 01/03/2015    Ernesto Rutherford, OTS 03/30/2018, 1:58 PM  This entire session was performed under direct supervision and direction of a licensed therapist/therapist assistant . I have personally read, edited and approve of the note as written.  Olegario Messier, MS, OTR/L  [place] [facility] MAIN [facility] SERVICES [address] [place], [place], [address] Phone: [phone]   Fax:  [phone]  Name: BETTYE SITTON MRN: [id number] Date of Birth: 12/14/1955

## 2018-03-30 NOTE — Therapy (Signed)
[place] MAIN [facility] SERVICES [address] [place], [place], [address] Phone: [phone]   Fax:  [phone]  Physical Therapy Treatment  Patient Details  Name: [name] MRN: [id number] Date of Birth: 07/14/1955 Referring Provider (PT): Anabel Bene   Encounter Date: 03/30/2018  PT End of Session - 03/30/18 1358    Visit Number  19    Number of Visits  25    Date for PT Re-Evaluation  04/01/18    Authorization Type  9/10    PT Start Time  1350    PT Stop Time  1438    PT Time Calculation (min)  48 min    Equipment Utilized During Treatment  Gait belt    Activity Tolerance  Patient tolerated treatment well    Behavior During Therapy  WFL for tasks assessed/performed       Past Medical History:  Diagnosis Date  . Headache   . Hyperlipidemia   . MS (multiple sclerosis) ([place]) 1990  . Neurogenic bladder     Past Surgical History:  Procedure Laterality Date  . ABDOMINAL HYSTERECTOMY    . OOPHORECTOMY Bilateral     There were no vitals filed for this visit.  Subjective Assessment - 03/30/18 1355    Subjective  Patient reports pain today in her LLE and LUE which is constant and chronic. Rates pain as 4/10 today. HEP is going well. No specific questions or concers at this time.     Pertinent History  Patient reports about 3 months ago she started to feel weak in the left arm, reports she is legally blind.       How Lapp can you stand comfortably?  not too [place]     Patient Stated Goals  Patient wants to be able to walk better.     Currently in Pain?  Yes    Pain Score  4     Pain Location  Leg    Pain Orientation  Left    Pain Descriptors / Indicators  Aching    Pain Type  Chronic pain    Pain Onset  More than a month ago        TREATMENT  Ther-ex  BLE leg press 75# 2 x 20 reps, VCs for controlling eccentric return as well as min A for foot placement and positioning on footplate   BLE heel raises 45# 2 x  15; Hooklying bridges 2 x 15; LLE SLR 2 x 15 with light manual resistance; Hooklying adductor ball squeeze 3s hold x 10; Hooklying isometric clam with belt 3s hold x 10; Hooklying isometric trunk rotation with manual resistance at the knees by therapist 3s hold x 10 each direction;  Neuromuscular Re-education  Alternating toe taps to 6 inch step with 1 rail assist x 10 reps bilaterally; Step-ups to 6" step alternating LE x 10 each; Heel/toe rocking x 10 reps without UE assist; Mini squat without UE support x 10 reps; Semi tandem stance alternating forward LE 2 x 30s each without UE support; Hurdle steps alternating LE x 10 each with single UE support, attempted without UE support however pt is unable to perform;   Pt educated throughout session about proper posture and technique with exercises. Improved exercise technique, movement at target joints, use of target muscles after min to mod verbal, visual, tactile cues.    Focused more on strengthening today with patient and worked on balance at the end of the session. Pt demonstrates need  for extensive verbal and tactile cues during supine exercises. Pt struggles with semi-tandem and single leg stance. Will update goals and outcome measures at next visit and decide on future therapy. Pt will benefit from PT services to address deficits in strength, balance, and mobility in order to return to full function at home.                       PT Short Term Goals - 03/16/18 1521      PT SHORT TERM GOAL #1   Title  Patient will be independent in home exercise program to improve strength/mobility for better functional independence with ADLs.    Time  6    Period  Weeks    Status  On-going      PT SHORT TERM GOAL #2   Title  nt (< 67 years old) will complete five times sit to stand test in < 10 seconds indicating an increased LE strength and improved balance.    Baseline  02/11/18= 19.38; 03/16/18: 18.0s    Time  6    Period   Weeks    Status  On-going        PT Merkey Term Goals - 03/16/18 1523      PT Zenon TERM GOAL #1   Title  Patient will increase six minute walk test distance to >1000 for progression to community ambulator and improve gait ability    Baseline  02/11/18= 500 feet; 03/16/18: 830 feet    Time  12    Period  Weeks    Status  Partially Met    Target Date  04/01/18      PT Padget TERM GOAL #2   Title  Patient will increase 10 meter walk test to >1.67ms as to improve gait speed for better community ambulation and to reduce fall risk.    Baseline  02/11/18 . 68 m/sec; 03/16/18: self-selected: 12.6s= 0.79 m/s fastest: 10.4s = 0.96 m/s    Time  12    Period  Weeks    Status  Partially Met    Target Date  04/01/18      PT Braid TERM GOAL #3   Title  Patient will reduce timed up and go to <11 seconds to reduce fall risk and demonstrate improved transfer/gait ability.    Baseline  17.49 sec 02/11/18; 03/16/18: 16.8s    Time  12    Period  Weeks    Status  Partially Met      PT Seyer TERM GOAL #4   Title  Patient (> 666years old) will complete five times sit to stand test in < 15 seconds indicating an increased LE strength and improved balance.    Baseline  19.38sec 02/11/18; 03/16/18: 18.0s    Time  12    Period  Weeks    Status  Partially Met    Target Date  04/01/18            Plan - 03/30/18 1541    Clinical Impression Statement  Focused more on strengthening today with patient and worked on balance at the end of the session. Pt demonstrates need for extensive verbal and tactile cues during supine exercises. Pt struggles with semi-tandem and single leg stance. Will update goals and outcome measures at next visit and decide on future therapy. Pt will benefit from PT services to address deficits in strength, balance, and mobility in order to return to full function at home.  Rehab Potential  Good    PT Frequency  2x / week    PT Duration  8 weeks    PT Treatment/Interventions  Gait  training;Therapeutic exercise;Therapeutic activities;Functional mobility training;Neuromuscular re-education;Patient/family education;Balance training    PT Next Visit Plan  strengthening, balance training    PT Home Exercise Plan  HEP    Consulted and Agree with Plan of Care  Patient       Patient will benefit from skilled therapeutic intervention in order to improve the following deficits and impairments:  Abnormal gait, Decreased balance, Decreased endurance, Postural dysfunction, Impaired flexibility, Decreased strength, Decreased safety awareness, Decreased activity tolerance, Decreased range of motion, Decreased knowledge of precautions, Impaired sensation, Difficulty walking  Visit Diagnosis: Other lack of coordination  Muscle weakness (generalized)  Difficulty in walking, not elsewhere classified     Problem List Patient Active Problem List   Diagnosis Date Noted  . Lower extremity edema 04/15/2017  . Macrocytic anemia 02/11/2017  . Vitamin D deficiency 02/02/2016  . Mixed hyperlipidemia 01/03/2015  . MS (multiple sclerosis) ([place]) 01/03/2015  . Chronic pain 01/03/2015  . GERD without esophagitis 01/03/2015  . COPD (chronic obstructive pulmonary disease) ([place]) 01/03/2015  . Osteopenia 01/03/2015  . Neurogenic bladder 01/03/2015  . Incontinence in female 01/03/2015   Phillips Grout PT, DPT, GCS  Moraima Burd 03/30/2018, 3:44 PM  [place] MAIN [facility] SERVICES [address] [place], [place], [address] Phone: [phone]   Fax:  [phone]  Name: ERNESTO LASHWAY MRN: [id number] Date of Birth: Aug 02, 1955

## 2018-04-01 ENCOUNTER — Encounter: Payer: Self-pay | Admitting: Occupational Therapy

## 2018-04-01 ENCOUNTER — Ambulatory Visit: Payer: Medicare Other | Admitting: Occupational Therapy

## 2018-04-01 ENCOUNTER — Encounter: Payer: Self-pay | Admitting: Physical Therapy

## 2018-04-01 ENCOUNTER — Ambulatory Visit: Payer: Medicare Other | Admitting: Physical Therapy

## 2018-04-01 DIAGNOSIS — R278 Other lack of coordination: Secondary | ICD-10-CM | POA: Diagnosis not present

## 2018-04-01 DIAGNOSIS — M6281 Muscle weakness (generalized): Secondary | ICD-10-CM

## 2018-04-01 DIAGNOSIS — R262 Difficulty in walking, not elsewhere classified: Secondary | ICD-10-CM

## 2018-04-01 NOTE — Therapy (Addendum)
[place] MAIN [facility] SERVICES [address] [place], [place], [address] Phone: [phone]   Fax:  [phone]  Physical Therapy Treatment/ Physical Therapy Progress Note   Dates of reporting period  02/11/18 to   04/01/09  Patient Details  Name: [name] MRN: [id number] Date of Birth: 06-16-1956 Referring Provider (PT): Anabel Bene   Encounter Date: 04/01/2018  PT End of Session - 04/01/18 1341    Visit Number  20    Number of Visits  25    Date for PT Re-Evaluation  05/27/18    Authorization Type  10/10    PT Start Time  0145    PT Stop Time  0230    PT Time Calculation (min)  45 min    Equipment Utilized During Treatment  Gait belt    Activity Tolerance  Patient tolerated treatment well    Behavior During Therapy  WFL for tasks assessed/performed       Past Medical History:  Diagnosis Date  . Headache   . Hyperlipidemia   . MS (multiple sclerosis) ([place]) 1990  . Neurogenic bladder     Past Surgical History:  Procedure Laterality Date  . ABDOMINAL HYSTERECTOMY    . OOPHORECTOMY Bilateral     There were no vitals filed for this visit.  Patient performs outcome measures today: 5 x sit to stand, 10 MW, TUG, 6 MW , with progress made towards goals # 1,2,3,4  Neuromuscular Re-education  Alternating toe taps to 6 inch step with 1 rail assist x 10 reps bilaterally; Step-ups to 6" step alternating LE x 10 each; Heel/toe rocking x 10 reps without UE assist; Mini squat without UE support x 10reps;     Pt educated throughout session about proper posture and technique with exercises. Improved exercise technique, movement at target joints, use of target muscles after min to mod verbal, visual, tactile cues.                         PT Education - 04/01/18 1338    Education Details  HEP,safety    Person(s) Educated  Patient    Methods  Explanation    Comprehension  Verbalized  understanding;Returned demonstration;Verbal cues required       PT Short Term Goals - 03/16/18 1521      PT SHORT TERM GOAL #1   Title  Patient will be independent in home exercise program to improve strength/mobility for better functional independence with ADLs.    Time  6    Period  Weeks    Status  On-going      PT SHORT TERM GOAL #2   Title  nt (< 82 years old) will complete five times sit to stand test in < 10 seconds indicating an increased LE strength and improved balance.    Baseline  02/11/18= 19.38; 03/16/18: 18.0s    Time  6    Period  Weeks    Status  On-going        PT Blank Term Goals - 04/01/18 1356      PT Brumley TERM GOAL #1   Title  Patient will increase six minute walk test distance to >1000 for progression to community ambulator and improve gait ability    Baseline  02/11/18= 500 feet; 03/16/18: 830 feet,  04/01/18= 515    Time  12    Period  Weeks    Status  Partially Met  Target Date  05/27/18      PT Zee TERM GOAL #2   Title  Patient will increase 10 meter walk test to >1.9ms as to improve gait speed for better community ambulation and to reduce fall risk.    Baseline  02/11/18 . 68 m/sec; 03/16/18: self-selected: 12.6s= 0.79 m/s fastest: 10.4s = 0.96 m/s, 04/01/18= .1.0 m/sec    Time  12    Period  Weeks    Status  Partially Met    Target Date  05/27/18      PT Cullins TERM GOAL #3   Title  Patient will reduce timed up and go to <11 seconds to reduce fall risk and demonstrate improved transfer/gait ability.    Baseline  17.49 sec 02/11/18; 03/16/18: 16.8s, 04/01/18= 15.9 sec    Time  12    Period  Weeks    Status  Partially Met    Target Date  05/27/18      PT Dhami TERM GOAL #4   Title  Patient (> 680years old) will complete five times sit to stand test in < 15 seconds indicating an increased LE strength and improved balance.    Baseline  19.38sec 02/11/18; 03/16/18: 18.0s, 04/01/18= 17.04 sec    Time  12    Period  Weeks    Status  Partially Met     Target Date  05/27/18            Plan - 04/01/18 1344    Clinical Impression Statement  Patient's condition has the potential to improve in response to therapy. Maximum improvement is yet to be obtained. The anticipated improvement is attainable and reasonable in a generally predictable time.  Patient reports that she is walking somewhat better and feels more confident.  Patient demonstrates improved stability and strength allowing patient to perform short duration standing interventions with rest periods. Patient performs beginning standing dynamic standing balance exercises to shift weight and perform single leg standing activities with mod assist. Patient fatigues quickly with exercises requiring rest breaks at this time. Patient will continue to benefit from skilled physical therapy as she is improving in balance and safety and making progress towards her goals.    Rehab Potential  Good    PT Frequency  2x / week    PT Duration  8 weeks    PT Treatment/Interventions  Gait training;Therapeutic exercise;Therapeutic activities;Functional mobility training;Neuromuscular re-education;Patient/family education;Balance training    PT Next Visit Plan  strengthening, balance training    PT Home Exercise Plan  HEP    Consulted and Agree with Plan of Care  Patient       Patient will benefit from skilled therapeutic intervention in order to improve the following deficits and impairments:  Abnormal gait, Decreased balance, Decreased endurance, Postural dysfunction, Impaired flexibility, Decreased strength, Decreased safety awareness, Decreased activity tolerance, Decreased range of motion, Decreased knowledge of precautions, Impaired sensation, Difficulty walking  Visit Diagnosis: Other lack of coordination - Plan: PT plan of care cert/re-cert  Muscle weakness (generalized) - Plan: PT plan of care cert/re-cert  Difficulty in walking, not elsewhere classified - Plan: PT plan of care  cert/re-cert     Problem List Patient Active Problem List   Diagnosis Date Noted  . Lower extremity edema 04/15/2017  . Macrocytic anemia 02/11/2017  . Vitamin D deficiency 02/02/2016  . Mixed hyperlipidemia 01/03/2015  . MS (multiple sclerosis) ([place] 01/03/2015  . Chronic pain 01/03/2015  . GERD without esophagitis 01/03/2015  . COPD (chronic obstructive  pulmonary disease) ([place]) 01/03/2015  . Osteopenia 01/03/2015  . Neurogenic bladder 01/03/2015  . Incontinence in female 01/03/2015    Alanson Puls, PT DPT 04/01/2018, 2:27 PM  [place] MAIN [facility] SERVICES [address] [place], [place], [address] Phone: [phone]   Fax:  [phone]  Name: [name] MRN: [id number] Date of Birth: 11-10-1955

## 2018-04-01 NOTE — Addendum Note (Signed)
Addended by: Ezekiel Ina on: 04/01/2018 02:27 PM   Modules accepted: Orders

## 2018-04-01 NOTE — Therapy (Addendum)
[place] [facility] MAIN [facility] SERVICES [address] [place], [place], [address] Phone: [phone]   Fax:  [phone]  Occupational Therapy Treatment  Patient Details  Name: [name] MRN: [id number] Date of Birth: [birth year] No data recorded  Encounter Date: 04/01/2018  OT End of Session - 04/01/18 1609    Visit Number  36    Number of Visits  48    Date for OT Re-Evaluation  04/13/18    Authorization Type  Visit 7 of 10 for progress report staring 03/09/2018    OT Start Time  1430    OT Stop Time  1515    OT Time Calculation (min)  45 min    Activity Tolerance  Patient tolerated treatment well    Behavior During Therapy  [facility] for tasks assessed/performed       Past Medical History:  Diagnosis Date  . Headache   . Hyperlipidemia   . MS (multiple sclerosis) (HCC) 1990  . Neurogenic bladder     Past Surgical History:  Procedure Laterality Date  . ABDOMINAL HYSTERECTOMY    . OOPHORECTOMY Bilateral     There were no vitals filed for this visit.  Subjective Assessment - 04/01/18 1607    Subjective   Pt reports that she is doing well and that she is looking forward to her son's birthday coming up soon.    Pertinent History  Patient reports she was diagnosed with MS years ago and has recently started to feel weak in her left dominant arm and hand.  She denies any previous therapy and reports memory issues and can provide a limited history.     Patient Stated Goals  Patient reports she wants to be able to walk better, use her left hand and be able to write again.     Currently in Pain?  Yes    Pain Score  4     Pain Location  Leg    Pain Orientation  Left    Pain Descriptors / Indicators  Aching    Pain Type  Chronic pain    Pain Onset  More than a month ago    Pain Frequency  Constant       OT TREATMENT  Neuromuscular re-education:  Pt completed [facility] task that required her to use L hand to sort small magnets on a whiteboard placed  at a vertical angle. Pt required increased cuing to accurately sort magnets by color. Pt notes having difficulties differentiating between similar colors. Pt demonstrated increased L hand tremors when placing magnets on whiteboard. Pt arrived to treatment not wearing her glasses. Pt removed x5 magnets from whiteboard using red resistive clip. Pt demonstrated increased tremors when using resistive clip to remove pegs and became notably frustrated with the task. Pt completed [facility] task that required her to place and remove small pegs into a pegboard. Pt required increased cuing to use tip to tip pinch, alternating 2nd and 3rd digit to thumb, to remove pegs. Pt opted to don glasses during this task.  Self-care:  Pt completed self-care task that required her to print her name using a weight pen and unlined paper. Pt required increased cuing to decrease writing speed in order to increase legibility. Pt demonstrated increased legibility with weight pen and decreased writing speed when printing (~60% legible). Pt is still unable to legibly write in cursive. Pt experiences increased tremors when attempting to write in cursive  OT Education - 04/01/18 1609    Education provided  Yes    Education Details  FMC skills, handwriting    Person(s) Educated  Patient    Methods  Explanation;Demonstration;Verbal cues    Comprehension  Verbalized understanding;Verbal cues required;Returned demonstration          OT Burget Term Goals - 03/09/18 1525      OT Masaki TERM GOAL #1   Title  Patient will demonstrate holding utensils in her left hand and feeding herself with modified independence with minimal spillage.      Baseline  01/19/2018: Independent with using a spoon, and fork, Pt. requires assit from he caregiver for cutting, and slicing food.    Time  8    Period  Weeks    Status  On-going    Target Date  04/12/18      OT Riveron TERM GOAL #2   Title  Patient wil complete  tub/shower transfer with supervision only.    Baseline  min assist     Time  8    Period  Weeks    Status  On-going    Target Date  04/13/18      OT Daversa TERM GOAL #3   Title  Patient will complete bathing UB and LB with supervision only.     Baseline  min assist at eval; modified independent on 12-10-17    Time  12    Period  Weeks    Status  Achieved    Target Date  01/23/18      OT Bero TERM GOAL #4   Title  Patient will increase left UE strength by 1 mm grade to be able to reach into closet to retrieve clothing items.     Baseline  Pt. LUE strength increased to 4-/5. Pt. is now able to reach items in her closet independently    Time  12    Period  Weeks    Status  On-going    Target Date  04/13/18      OT Fickle TERM GOAL #5   Title  Patient will improve coordination on the left to be able to hold pen securely and write her name with 75% legibility.     Baseline  Pt. continues to present with decreased legibility with printing her name.     Time  12    Period  Weeks    Status  On-going    Target Date  04/13/18      OT Bisig TERM GOAL #6   Title  Patient will complete lower body dressing with modified independence using adaptive equipment if needed.     Baseline  Pt. continues to require minA able to get pants on, pt. to continue working on her tying shoes     Time  12    Period  Weeks    Status  On-going    Target Date  04/13/18            Plan - 04/01/18 1610    Clinical Impression Statement  Pt continues to demonstrate deficits in L hand [facility] skills and handwriting illegibility. Pt demonstrates increased L hand tremors during treatment today. Pt becomes notably frustrated as tremors increase. Pt demonstrated increased legibility when writing with weighted pen and cued to decrease writing speed. Pt is unable to sign name at this time.    Occupational Profile and client history currently impacting functional performance  patient is legally blind, progressive disease  process with MS  for over 20+ years, lives alone, limited availability for transportation.      Occupational performance deficits (Please refer to evaluation for details):  ADL's;Leisure    Rehab Potential  Good    OT Frequency  2x / week    OT Duration  12 weeks    OT Treatment/Interventions  Therapeutic exercise;Cognitive remediation/compensation;Functional Mobility Training    Plan  Positive:  motivation, family support.  Negative:  lives alone, legally blind, requires caregiver 7 days a week for a few hours a day for self care    Clinical Decision Making  Several treatment options, min-mod task modification necessary    Consulted and Agree with Plan of Care  Patient       Patient will benefit from skilled therapeutic intervention in order to improve the following deficits and impairments:  Decreased cognition, Decreased knowledge of use of DME, Impaired vision/preception, Pain, Decreased coordination, Decreased activity tolerance, Decreased endurance, Decreased strength, Decreased balance, Impaired UE functional use  Visit Diagnosis: Other lack of coordination    Problem List Patient Active Problem List   Diagnosis Date Noted  . Lower extremity edema 04/15/2017  . Macrocytic anemia 02/11/2017  . Vitamin D deficiency 02/02/2016  . Mixed hyperlipidemia 01/03/2015  . MS (multiple sclerosis) (HCC) 01/03/2015  . Chronic pain 01/03/2015  . GERD without esophagitis 01/03/2015  . COPD (chronic obstructive pulmonary disease) (HCC) 01/03/2015  . Osteopenia 01/03/2015  . Neurogenic bladder 01/03/2015  . Incontinence in female 01/03/2015    Ernesto Rutherford, OTS 04/01/2018, 4:15 PM   This entire session was performed under direct supervision and direction of a licensed therapist/therapist assistant . I have personally read, edited and approve of the note as written.  Olegario Messier, MS, OTR/L   [place] [facility] MAIN [facility] SERVICES [address]  [place], [place], [address] Phone: [phone]   Fax:  [phone]  Name: [name] MRN: [id number] Date of Birth: 08/19/1955

## 2018-04-07 ENCOUNTER — Ambulatory Visit: Payer: Medicare Other | Admitting: Occupational Therapy

## 2018-04-07 ENCOUNTER — Encounter: Payer: Self-pay | Admitting: Physical Therapy

## 2018-04-07 ENCOUNTER — Encounter: Payer: Self-pay | Admitting: Occupational Therapy

## 2018-04-07 ENCOUNTER — Ambulatory Visit: Payer: Medicare Other | Admitting: Physical Therapy

## 2018-04-07 DIAGNOSIS — M6281 Muscle weakness (generalized): Secondary | ICD-10-CM

## 2018-04-07 DIAGNOSIS — R262 Difficulty in walking, not elsewhere classified: Secondary | ICD-10-CM | POA: Diagnosis not present

## 2018-04-07 DIAGNOSIS — R278 Other lack of coordination: Secondary | ICD-10-CM | POA: Diagnosis not present

## 2018-04-07 NOTE — Therapy (Signed)
[place] MAIN [facility] SERVICES [address] [place], [place], [address] Phone: [phone]   Fax:  [phone]  Physical Therapy Treatment  Patient Details  Name: [name] MRN: [id number] Date of Birth: 07-12-1955 Referring Provider (PT): Anabel Bene   Encounter Date: 04/07/2018  PT End of Session - 04/07/18 1430    Visit Number  21    Number of Visits  25    Date for PT Re-Evaluation  05/27/18    Authorization Type  1/10    PT Start Time  0230    PT Stop Time  0310    PT Time Calculation (min)  40 min    Equipment Utilized During Treatment  Gait belt    Activity Tolerance  Patient tolerated treatment well    Behavior During Therapy  WFL for tasks assessed/performed       Past Medical History:  Diagnosis Date  . Headache   . Hyperlipidemia   . MS (multiple sclerosis) ([place]) 1990  . Neurogenic bladder     Past Surgical History:  Procedure Laterality Date  . ABDOMINAL HYSTERECTOMY    . OOPHORECTOMY Bilateral     There were no vitals filed for this visit.  Subjective Assessment - 04/07/18 1436    Subjective  Patient reports pain today in her LLE and LUE which is constant and chronic. Rates pain as 4/10 today. HEP is going well. No specific questions or concers at this time.     Pertinent History  Patient reports about 3 months ago she started to feel weak in the left arm, reports she is legally blind.       How [place] can you stand comfortably?  not too [place]     Patient Stated Goals  Patient wants to be able to walk better.     Currently in Pain?  Yes    Pain Score  4     Pain Location  Leg    Pain Orientation  Left    Pain Descriptors / Indicators  Aching    Pain Onset  More than a month ago    Pain Frequency  Constant    Aggravating Factors   any activity    Pain Relieving Factors  rest    Effect of Pain on Daily Activities  NA    Multiple Pain Sites  No         Treatment:  TM x . 5 miles/ hour x 5 mins    Leg press x 20 90 lbs  Lunge into BOSU ball x 15 BLE with cues to bend her knee fwd more and keeping her back leg further back  Step ups to 6 inch stool x 20 BLE, HHA  Step backwards to 6 inch stool x 20 BLE, HHA  Side stepping over 6 inch step stool x 20 BLE, HHA  Toe taps to 6 inch stool x 20, HHA  Standing hip abd, hip ext with RTB x 10 x 2,HHA  Heel raises x 20,HHA  Squats x 10 with 3 second hold, HHA  Sit to stand with 2 lb rod x 10 reps  CGA and Min verbal cues used throughout. Continues to have balance deficits typical with diagnosis. Patient performs intermediate level exercises without pain behaviors and needs verbal cuing for postural alignment                        PT Education - 04/07/18 1429  Education Details  HEP    Person(s) Educated  Patient    Methods  Explanation;Demonstration;Tactile cues    Comprehension  Verbalized understanding;Returned demonstration;Need further instruction       PT Short Term Goals - 03/16/18 1521      PT SHORT TERM GOAL #1   Title  Patient will be independent in home exercise program to improve strength/mobility for better functional independence with ADLs.    Time  6    Period  Weeks    Status  On-going      PT SHORT TERM GOAL #2   Title  nt (< 61 years old) will complete five times sit to stand test in < 10 seconds indicating an increased LE strength and improved balance.    Baseline  02/11/18= 19.38; 03/16/18: 18.0s    Time  6    Period  Weeks    Status  On-going        PT Kishi Term Goals - 04/01/18 1356      PT Steyer TERM GOAL #1   Title  Patient will increase six minute walk test distance to >1000 for progression to community ambulator and improve gait ability    Baseline  02/11/18= 500 feet; 03/16/18: 830 feet,  04/01/18= 515    Time  12    Period  Weeks    Status  Partially Met    Target Date  05/27/18      PT Gundry TERM GOAL #2   Title  Patient will increase 10 meter walk  test to >1.77ms as to improve gait speed for better community ambulation and to reduce fall risk.    Baseline  02/11/18 . 68 m/sec; 03/16/18: self-selected: 12.6s= 0.79 m/s fastest: 10.4s = 0.96 m/s, 04/01/18= .1.0 m/sec    Time  12    Period  Weeks    Status  Partially Met    Target Date  05/27/18      PT Fergusson TERM GOAL #3   Title  Patient will reduce timed up and go to <11 seconds to reduce fall risk and demonstrate improved transfer/gait ability.    Baseline  17.49 sec 02/11/18; 03/16/18: 16.8s, 04/01/18= 15.9 sec    Time  12    Period  Weeks    Status  Partially Met    Target Date  05/27/18      PT Crance TERM GOAL #4   Title  Patient (> 61years old) will complete five times sit to stand test in < 15 seconds indicating an increased LE strength and improved balance.    Baseline  19.38sec 02/11/18; 03/16/18: 18.0s, 04/01/18= 17.04 sec    Time  12    Period  Weeks    Status  Partially Met    Target Date  05/27/18            Plan - 04/07/18 1435    Clinical Impression Statement  Patient instructed in advanced dynamic and static balance exercise. Utilized stable and uneven surfaces to further challenge dynamic balance. Patient required min Vcs for correct positioning and exercise technique. Patient had a difficult time during  narrow base of support challenges.  He exhibits better SLS ability being able to progress to foam; Patient would benefit from additional skilled PT Intervention to improve strength, balance and gait safety.    Rehab Potential  Good    PT Frequency  2x / week    PT Duration  8 weeks    PT Treatment/Interventions  Gait training;Therapeutic  exercise;Therapeutic activities;Functional mobility training;Neuromuscular re-education;Patient/family education;Balance training    PT Next Visit Plan  strengthening, balance training    PT Home Exercise Plan  HEP    Consulted and Agree with Plan of Care  Patient       Patient will benefit from skilled therapeutic intervention  in order to improve the following deficits and impairments:  Abnormal gait, Decreased balance, Decreased endurance, Postural dysfunction, Impaired flexibility, Decreased strength, Decreased safety awareness, Decreased activity tolerance, Decreased range of motion, Decreased knowledge of precautions, Impaired sensation, Difficulty walking  Visit Diagnosis: Other lack of coordination  Muscle weakness (generalized)  Difficulty in walking, not elsewhere classified     Problem List Patient Active Problem List   Diagnosis Date Noted  . Lower extremity edema 04/15/2017  . Macrocytic anemia 02/11/2017  . Vitamin D deficiency 02/02/2016  . Mixed hyperlipidemia 01/03/2015  . MS (multiple sclerosis) ([place]) 01/03/2015  . Chronic pain 01/03/2015  . GERD without esophagitis 01/03/2015  . COPD (chronic obstructive pulmonary disease) ([place]) 01/03/2015  . Osteopenia 01/03/2015  . Neurogenic bladder 01/03/2015  . Incontinence in female 01/03/2015    Alanson Puls, PT DPT 04/07/2018, 2:41 PM  [place] MAIN [facility] SERVICES [address] [place], [place], [address] Phone: [phone]   Fax:  [phone]  Name: KIANI WURTZEL MRN: [id number] Date of Birth: 05/13/1956

## 2018-04-07 NOTE — Therapy (Addendum)
[place] [facility] MAIN [facility] SERVICES [address] [place], [place], [address] Phone: [phone]   Fax:  [phone]  Occupational Therapy Treatment  Patient Details  Name: [name] MRN: [id number] Date of Birth: [birth year] No data recorded  Encounter Date: 04/07/2018  OT End of Session - 04/07/18 1458    Visit Number  37    Number of Visits  48    Date for OT Re-Evaluation  04/13/18    Authorization Type  Visit 8 of 10 for progress report staring 03/09/2018    OT Start Time  1400    OT Stop Time  1435    OT Time Calculation (min)  35 min    Activity Tolerance  Patient tolerated treatment well    Behavior During Therapy  [facility] for tasks assessed/performed       Past Medical History:  Diagnosis Date  . Headache   . Hyperlipidemia   . MS (multiple sclerosis) (HCC) 1990  . Neurogenic bladder     Past Surgical History:  Procedure Laterality Date  . ABDOMINAL HYSTERECTOMY    . OOPHORECTOMY Bilateral     There were no vitals filed for this visit.  Subjective Assessment - 04/07/18 1456    Subjective   Pt reports that she is doing well today.    Pertinent History  Patient reports she was diagnosed with MS years ago and has recently started to feel weak in her left dominant arm and hand.  She denies any previous therapy and reports memory issues and can provide a limited history.     Patient Stated Goals  Patient reports she wants to be able to walk better, use her left hand and be able to write again.     Currently in Pain?  Yes    Pain Score  4     Pain Location  Leg    Pain Orientation  Left    Pain Descriptors / Indicators  Aching    Pain Type  Chronic pain    Pain Onset  More than a month ago    Pain Frequency  Constant    Aggravating Factors   Increased activity    Pain Relieving Factors  Rest    Effect of Pain on Daily Activities  N/A    Multiple Pain Sites  No      OT TREATMENT  Neuromuscular re-education: Pt completed  FMC task that required her to stack coins of various sizes into stacks of 5 before placing them into a bank using her L hand. Pt required increased time to stack coins. Pt demonstrated increased signs of frustration when manipulating smaller coins. Pt was unable to store x5 coins in her palm and translate them to her fingertips without frequently dropping them.   Therapeutic exercise: Pt completed L pinch strengthening task that required her to place and remove red and green resistive clips on a small wooden dowel using a 3 point pinch pattern. Pt demonstrated signs of increase exertion when using the green clips.                OT Education - 04/07/18 1458    Education provided  Yes    Education Details  [facility] skills, hand strengthening    Person(s) Educated  Patient    Methods  Explanation;Demonstration;Verbal cues    Comprehension  Verbalized understanding;Verbal cues required;Returned demonstration          OT Wille Term Goals - 03/09/18 1525  OT Tahir TERM GOAL #1   Title  Patient will demonstrate holding utensils in her left hand and feeding herself with modified independence with minimal spillage.      Baseline  01/19/2018: Independent with using a spoon, and fork, Pt. requires assit from he caregiver for cutting, and slicing food.    Time  8    Period  Weeks    Status  On-going    Target Date  04/12/18      OT Arrellano TERM GOAL #2   Title  Patient wil complete tub/shower transfer with supervision only.    Baseline  min assist     Time  8    Period  Weeks    Status  On-going    Target Date  04/13/18      OT Haws TERM GOAL #3   Title  Patient will complete bathing UB and LB with supervision only.     Baseline  min assist at eval; modified independent on 12-10-17    Time  12    Period  Weeks    Status  Achieved    Target Date  01/23/18      OT Yanes TERM GOAL #4   Title  Patient will increase left UE strength by 1 mm grade to be able to reach into closet to  retrieve clothing items.     Baseline  Pt. LUE strength increased to 4-/5. Pt. is now able to reach items in her closet independently    Time  12    Period  Weeks    Status  On-going    Target Date  04/13/18      OT Gatchel TERM GOAL #5   Title  Patient will improve coordination on the left to be able to hold pen securely and write her name with 75% legibility.     Baseline  Pt. continues to present with decreased legibility with printing her name.     Time  12    Period  Weeks    Status  On-going    Target Date  04/13/18      OT Kinnett TERM GOAL #6   Title  Patient will complete lower body dressing with modified independence using adaptive equipment if needed.     Baseline  Pt. continues to require minA able to get pants on, pt. to continue working on her tying shoes     Time  12    Period  Weeks    Status  On-going    Target Date  04/13/18            Plan - 04/07/18 1459    Clinical Impression Statement  Pt continues to demonstrate deficits in L hand [facility] skills and L hand muscle weakness. Pt demonstrated increased L hand FMC as the session progressed. Pt worked on L Geneticist, molecular tasks. Increased L hand FMC and strength is required to support independence during ADLs and IADLs.    Occupational Profile and client history currently impacting functional performance  patient is legally blind, progressive disease process with MS for over 20+ years, lives alone, limited availability for transportation.      Occupational performance deficits (Please refer to evaluation for details):  ADL's;Leisure    Rehab Potential  Good    OT Frequency  2x / week    OT Duration  12 weeks    OT Treatment/Interventions  Therapeutic exercise;Cognitive remediation/compensation;Functional Mobility Training    Plan  Positive:  motivation, family support.  Negative:  lives alone, legally blind, requires caregiver 7 days a week for a few hours a day for self care    Clinical Decision Making  Several  treatment options, min-mod task modification necessary    Consulted and Agree with Plan of Care  Patient       Patient will benefit from skilled therapeutic intervention in order to improve the following deficits and impairments:  Decreased cognition, Decreased knowledge of use of DME, Impaired vision/preception, Pain, Decreased coordination, Decreased activity tolerance, Decreased endurance, Decreased strength, Decreased balance, Impaired UE functional use  Visit Diagnosis: Other lack of coordination  Muscle weakness (generalized)    Problem List Patient Active Problem List   Diagnosis Date Noted  . Lower extremity edema 04/15/2017  . Macrocytic anemia 02/11/2017  . Vitamin D deficiency 02/02/2016  . Mixed hyperlipidemia 01/03/2015  . MS (multiple sclerosis) (HCC) 01/03/2015  . Chronic pain 01/03/2015  . GERD without esophagitis 01/03/2015  . COPD (chronic obstructive pulmonary disease) (HCC) 01/03/2015  . Osteopenia 01/03/2015  . Neurogenic bladder 01/03/2015  . Incontinence in female 01/03/2015    Ernesto Rutherford, OTS 04/07/2018, 3:06 PM   This entire session was performed under direct supervision and direction of a licensed therapist/therapist assistant.  I have personally read, edited and approve of the note as written.  Susanne Borders, OTR/L ascom [phone] 04/07/18, 3:36 PM   [place] [facility] MAIN [facility] SERVICES [address] [place], [place], [address] Phone: [phone]   Fax:  [phone]  Name: MARGARETTE VANNATTER MRN: [id number] Date of Birth: 1956/01/29

## 2018-04-09 ENCOUNTER — Encounter: Payer: Self-pay | Admitting: Physical Therapy

## 2018-04-09 ENCOUNTER — Encounter: Payer: Self-pay | Admitting: Occupational Therapy

## 2018-04-09 ENCOUNTER — Ambulatory Visit: Payer: Medicare Other | Admitting: Occupational Therapy

## 2018-04-09 ENCOUNTER — Ambulatory Visit: Payer: Medicare Other

## 2018-04-09 DIAGNOSIS — R278 Other lack of coordination: Secondary | ICD-10-CM | POA: Diagnosis not present

## 2018-04-09 DIAGNOSIS — R262 Difficulty in walking, not elsewhere classified: Secondary | ICD-10-CM

## 2018-04-09 DIAGNOSIS — M6281 Muscle weakness (generalized): Secondary | ICD-10-CM | POA: Diagnosis not present

## 2018-04-09 NOTE — Addendum Note (Signed)
Addended by: Norval Morton on: 04/09/2018 04:12 PM   Modules accepted: Orders

## 2018-04-09 NOTE — Therapy (Signed)
[place] MAIN [facility] SERVICES [address] [place], [place], [address] Phone: [phone]   Fax:  [phone]  Physical Therapy Treatment  Patient Details  Name: [name] MRN: [id number] Date of Birth: 22-Nov-1955 Referring Provider (PT): Anabel Bene   Encounter Date: 04/09/2018  PT End of Session - 04/09/18 1537    Visit Number  21    Number of Visits  25    Date for PT Re-Evaluation  05/27/18    Authorization Type  2/10    PT Start Time  1446    PT Stop Time  1530    PT Time Calculation (min)  44 min    Equipment Utilized During Treatment  Gait belt    Activity Tolerance  Patient tolerated treatment well    Behavior During Therapy  WFL for tasks assessed/performed       Past Medical History:  Diagnosis Date  . Headache   . Hyperlipidemia   . MS (multiple sclerosis) ([place]) 1990  . Neurogenic bladder     Past Surgical History:  Procedure Laterality Date  . ABDOMINAL HYSTERECTOMY    . OOPHORECTOMY Bilateral     There were no vitals filed for this visit.  Subjective Assessment - 04/09/18 1448    Subjective  Patient reports pain in LUE and LLE that is worse than usual. States she has been up walking a lot today and has been folding laundry.    Pertinent History  Patient reports about 3 months ago she started to feel weak in the left arm, reports she is legally blind.       How [place] can you stand comfortably?  not too [place]     Patient Stated Goals  Patient wants to be able to walk better.     Currently in Pain?  Yes    Pain Score  4     Pain Location  Leg    Pain Orientation  Left    Pain Descriptors / Indicators  Aching    Pain Type  Chronic pain    Pain Onset  More than a month ago        Treatment:  PT and patient ambulated to bathroom, close/distant supervision for bathroom activities, no LOB noted, patient utilizes sink and grab bar for safety during mobility and transfers.  Leg press x 20 90  lbs  Lunge into BOSU ball 2x10  BLE with cues to bend her knee fwd more and keeping her back leg further back in //  Step ups to 6 inch stool x 20 BLE, in // with cues to decrease weight bearing in UE  Side stepping up onto 6 inch step stool x 20 BLE with UE support, cues to decrease weight bearing in UE  Toe taps to 6 inch stool x 20, HHA  Standing hip abd, hip ext with RTB x 10 x 2,HHA  Heel raises x 20,HHA  Squats x 12 with 3 second hold, HHA  Sit to stand with 2 lb rod x 10 reps, x5 reps  CGA and close supervision throughout session to ensure safety. Verbal and demo needed from PT to optimize exercise technique and form.Patient with moderate complaints of fatigue at end of session, no complaints of increased pain. Stated she felt better than when she came in at end of session.    PT Education - 04/09/18 1535    Education Details  exercise technique/form    Person(s) Educated  Patient  Methods  Explanation;Demonstration    Comprehension  Verbalized understanding;Returned demonstration;Tactile cues required       PT Short Term Goals - 03/16/18 1521      PT SHORT TERM GOAL #1   Title  Patient will be independent in home exercise program to improve strength/mobility for better functional independence with ADLs.    Time  6    Period  Weeks    Status  On-going      PT SHORT TERM GOAL #2   Title  nt (< 43 years old) will complete five times sit to stand test in < 10 seconds indicating an increased LE strength and improved balance.    Baseline  02/11/18= 19.38; 03/16/18: 18.0s    Time  6    Period  Weeks    Status  On-going        PT [place] Term Goals - 04/01/18 1356      PT Derwin TERM GOAL #1   Title  Patient will increase six minute walk test distance to >1000 for progression to community ambulator and improve gait ability    Baseline  02/11/18= 500 feet; 03/16/18: 830 feet,  04/01/18= 515    Time  12    Period  Weeks    Status  Partially Met    Target Date   05/27/18      PT Van[place]st TERM GOAL #2   Title  Patient will increase 10 meter walk test to >1.65ms as to improve gait speed for better community ambulation and to reduce fall risk.    Baseline  02/11/18 . 68 m/sec; 03/16/18: self-selected: 12.6s= 0.79 m/s fastest: 10.4s = 0.96 m/s, 04/01/18= .1.0 m/sec    Time  12    Period  Weeks    Status  Partially Met    Target Date  05/27/18      PT Folk TERM GOAL #3   Title  Patient will reduce timed up and go to <11 seconds to reduce fall risk and demonstrate improved transfer/gait ability.    Baseline  17.49 sec 02/11/18; 03/16/18: 16.8s, 04/01/18= 15.9 sec    Time  12    Period  Weeks    Status  Partially Met    Target Date  05/27/18      PT Hofstra TERM GOAL #4   Title  Patient (> 640years old) will complete five times sit to stand test in < 15 seconds indicating an increased LE strength and improved balance.    Baseline  19.38sec 02/11/18; 03/16/18: 18.0s, 04/01/18= 17.04 sec    Time  12    Period  Weeks    Status  Partially Met    Target Date  05/27/18            Plan - 04/09/18 1535    Clinical Impression Statement  Patient had most difficulty with advanced dynamic balance exercises today. PT provided close supervision/CGA throughout session. Patient needed occasional verbal/demo for appropriate exercise technique. Most difficulty with sit to stands from low surface today, slightly elevated table to improve quality of movement and control of movement.     PT Frequency  2x / week    PT Duration  8 weeks    PT Treatment/Interventions  Gait training;Therapeutic exercise;Therapeutic activities;Functional mobility training;Neuromuscular re-education;Patient/family education;Balance training    Consulted and Agree with Plan of Care  Patient       Patient will benefit from skilled therapeutic intervention in order to improve the following deficits and impairments:  Abnormal  gait, Decreased balance, Decreased endurance, Postural dysfunction, Impaired  flexibility, Decreased strength, Decreased safety awareness, Decreased activity tolerance, Decreased range of motion, Decreased knowledge of precautions, Impaired sensation, Difficulty walking  Visit Diagnosis: Other lack of coordination  Muscle weakness (generalized)  Difficulty in walking, not elsewhere classified     Problem List Patient Active Problem List   Diagnosis Date Noted  . Lower extremity edema 04/15/2017  . Macrocytic anemia 02/11/2017  . Vitamin D deficiency 02/02/2016  . Mixed hyperlipidemia 01/03/2015  . MS (multiple sclerosis) ([place]) 01/03/2015  . Chronic pain 01/03/2015  . GERD without esophagitis 01/03/2015  . COPD (chronic obstructive pulmonary disease) ([place]) 01/03/2015  . Osteopenia 01/03/2015  . Neurogenic bladder 01/03/2015  . Incontinence in female 01/03/2015    Lieutenant Diego PT, DPT 3:41 PM,04/09/18 [place] MAIN [facility] SERVICES [address] [place], [place], [address] Phone: [phone]   Fax:  [phone]  Name: [name] MRN: [id number] Date of Birth: 1956-02-16

## 2018-04-09 NOTE — Therapy (Addendum)
[facility] MAIN [facility] SERVICES [address] [place], [place], [address] Phone: [phone]   Fax:  [phone]  Occupational Therapy Treatment/Re-Certification  Patient Details  Name: [name] MRN: [id number] Date of Birth: 08/12/1955 No data recorded  Encounter Date: 04/09/2018  OT End of Session - 04/09/18 1437    Visit Number  38    Number of Visits  48    Date for OT Re-Evaluation  07/02/18    Authorization Type  Visit 9 of 10 for progress report staring 03/09/2018    OT Start Time  1406    OT Stop Time  1445    OT Time Calculation (min)  39 min    Activity Tolerance  Patient tolerated treatment well    Behavior During Therapy  [facility] for tasks assessed/performed       Past Medical History:  Diagnosis Date  . Headache   . Hyperlipidemia   . MS (multiple sclerosis) ([facility]) 1990  . Neurogenic bladder     Past Surgical History:  Procedure Laterality Date  . ABDOMINAL HYSTERECTOMY    . OOPHORECTOMY Bilateral     There were no vitals filed for this visit.  Subjective Assessment - 04/09/18 1430    Subjective   Pt reports that she is doing well today despite experiencing increased pain in her L leg.    Pertinent History  Patient reports she was diagnosed with MS years ago and has recently started to feel weak in her left dominant arm and hand.  She denies any previous therapy and reports memory issues and can provide a limited history.     Patient Stated Goals  Patient reports she wants to be able to walk better, use her left hand and be able to write again.     Currently in Pain?  Yes    Pain Score  4     Pain Location  Leg    Pain Orientation  Left    Pain Descriptors / Indicators  Aching    Pain Type  Chronic pain    Pain Onset  More than a month ago    Pain Frequency  Constant         [facility] OT Assessment - 04/09/18 1414      Coordination   Right 9 Hole Peg Test  38 secs    Left 9 Hole Peg Test  55 secs      Hand  Function   Right Hand Grip (lbs)  45    Right Hand Lateral Pinch  16 lbs    Right Hand 3 Point Pinch  12 lbs    Left Hand Grip (lbs)  29    Left Hand Lateral Pinch  15 lbs    Left 3 point pinch  10 lbs       OT TREATMENT  Measurements for grip, pinch, UE strength and [facility] skills were obtained. Goals were reviewed and updated to reflect progress thus far.  Neuromuscular re-education:   Pt completed [facility] task that required her to use her L hand to sort coins of various sizes before placing them into a resistive container. Pt demonstrated decreased coordination when manipulating dimes and pennies. Pt dropped x6 coins when placing them into resistive container with coins stored in her palm.            OT Education - 04/09/18 1432    Education provided  Yes    Education Details  [facility] skills, hand strengthening, POC, goals  Person(s) Educated  Patient    Methods  Explanation;Demonstration;Verbal cues    Comprehension  Verbalized understanding;Verbal cues required;Returned demonstration          OT [place] Term Goals - 04/09/18 1458      OT [place] TERM GOAL #1   Title  Patient will demonstrate holding utensils in her left hand and feeding herself with modified independence with minimal spillage.      Baseline  04/09/2018: Pt spills >50% of food when using L hand. Pt is able to cut soft meats while using her fork.    Time  8    Period  Weeks    Status  On-going    Target Date  06/04/18      OT [place] TERM GOAL #2   Title  Patient will complete tub/shower transfer with supervision only.    Baseline  04/09/2018 Pt requires min A to lift legs over tub for transfers    Time  8    Period  Weeks    Status  On-going    Target Date  06/04/18      OT [place] TERM GOAL #4   Title  Patient will increase left UE strength by 1 mm grade to be able to reach into closet to retrieve clothing items.     Baseline  04/09/2018 - Pt LUE strength 4-/5. Pt can reach items in her closet independently     Time  12    Period  Weeks    Status  On-going    Target Date  07/02/18      OT [place] TERM GOAL #5   Title  Patient will improve coordination on the left to be able to hold pen securely and write her name with 75% legibility.     Baseline  04/09/2018 - Pt prints name with ~60% legibility using standard pen with a grip    Time  12    Period  Weeks    Status  On-going    Target Date  07/02/18      OT [place] TERM GOAL #6   Title  Patient will complete lower body dressing with modified independence using adaptive equipment if needed.     Baseline  04/09/2018 - Pt requires min A to don pants. Pt requires increased time to tie shoes independently.    Time  12    Period  Weeks    Status  On-going    Target Date  07/02/18            Plan - 04/09/18 1439    Clinical Impression Statement  Measurements were obtained and goals were reviewed. Pt has made progress in her L grip, pinch, and UE strengthening. Pt continues to demonstrate deficits in her L hand [facility] skills. Pt still requires assistance for shower transfers and LB dressing. Pt is in the process of finding a swivel seat to help her enter the shower. Pt can print name with ~60% legibility using a pen with a larger grip. Pt to continue to benefit from skilled OT to address above deficits and increase independence during ADLs.    Occupational Profile and client history currently impacting functional performance  patient is legally blind, progressive disease process with MS for over 20+ years, lives alone, limited availability for transportation.      Occupational performance deficits (Please refer to evaluation for details):  ADL's;Leisure    Rehab Potential  Good    OT Frequency  2x / week    OT  Duration  12 weeks    OT Treatment/Interventions  Therapeutic exercise;Cognitive remediation/compensation;Functional Mobility Training    Plan  Positive:  motivation, family support.  Negative:  lives alone, legally blind, requires caregiver 7  days a week for a few hours a day for self care    Clinical Decision Making  Several treatment options, min-mod task modification necessary    Consulted and Agree with Plan of Care  Patient       Patient will benefit from skilled therapeutic intervention in order to improve the following deficits and impairments:  Decreased cognition, Decreased knowledge of use of DME, Impaired vision/preception, Pain, Decreased coordination, Decreased activity tolerance, Decreased endurance, Decreased strength, Decreased balance, Impaired UE functional use  Visit Diagnosis: Other lack of coordination    Problem List Patient Active Problem List   Diagnosis Date Noted  . Lower extremity edema 04/15/2017  . Macrocytic anemia 02/11/2017  . Vitamin D deficiency 02/02/2016  . Mixed hyperlipidemia 01/03/2015  . MS (multiple sclerosis) ([facility]) 01/03/2015  . Chronic pain 01/03/2015  . GERD without esophagitis 01/03/2015  . COPD (chronic obstructive pulmonary disease) ([facility]) 01/03/2015  . Osteopenia 01/03/2015  . Neurogenic bladder 01/03/2015  . Incontinence in female 01/03/2015    Ernesto Rutherford, OTS 04/09/2018, 3:56 PM   This entire session was performed under direct supervision and direction of a licensed therapist/therapist assistant.  I have personally read, edited and approve of the note as written.  Susanne Borders, OTR/L ascom [id number] 04/09/18, 4:06 PM   [facility] MAIN [facility] SERVICES [address] [place], [place], [address] Phone: [phone]   Fax:  [phone]  Name: BRISEIDY AMALFITANO MRN: [id number] Date of Birth: Aug 16, 1955

## 2018-04-13 ENCOUNTER — Emergency Department
Admission: EM | Admit: 2018-04-13 | Discharge: 2018-04-13 | Disposition: A | Discharge status: Home / Self Care | Payer: Medicare Other | Attending: Emergency Medicine | Admitting: Emergency Medicine

## 2018-04-13 ENCOUNTER — Encounter: Payer: Self-pay | Admitting: Emergency Medicine

## 2018-04-13 ENCOUNTER — Emergency Department: Payer: Medicare Other

## 2018-04-13 ENCOUNTER — Other Ambulatory Visit: Payer: Self-pay

## 2018-04-13 DIAGNOSIS — S7002XA Contusion of left hip, initial encounter: Secondary | ICD-10-CM | POA: Insufficient documentation

## 2018-04-13 DIAGNOSIS — J449 Chronic obstructive pulmonary disease, unspecified: Secondary | ICD-10-CM | POA: Diagnosis not present

## 2018-04-13 DIAGNOSIS — Y929 Unspecified place or not applicable: Secondary | ICD-10-CM | POA: Insufficient documentation

## 2018-04-13 DIAGNOSIS — G35 Multiple sclerosis: Secondary | ICD-10-CM | POA: Insufficient documentation

## 2018-04-13 DIAGNOSIS — Z87891 Personal history of nicotine dependence: Secondary | ICD-10-CM | POA: Insufficient documentation

## 2018-04-13 DIAGNOSIS — M25552 Pain in left hip: Secondary | ICD-10-CM | POA: Diagnosis not present

## 2018-04-13 DIAGNOSIS — S79912A Unspecified injury of left hip, initial encounter: Secondary | ICD-10-CM | POA: Diagnosis not present

## 2018-04-13 DIAGNOSIS — Z79899 Other long term (current) drug therapy: Secondary | ICD-10-CM | POA: Diagnosis not present

## 2018-04-13 DIAGNOSIS — W19XXXA Unspecified fall, initial encounter: Secondary | ICD-10-CM

## 2018-04-13 DIAGNOSIS — Y998 Other external cause status: Secondary | ICD-10-CM | POA: Diagnosis not present

## 2018-04-13 DIAGNOSIS — S0990XA Unspecified injury of head, initial encounter: Secondary | ICD-10-CM | POA: Insufficient documentation

## 2018-04-13 DIAGNOSIS — Y9389 Activity, other specified: Secondary | ICD-10-CM | POA: Diagnosis not present

## 2018-04-13 DIAGNOSIS — W06XXXA Fall from bed, initial encounter: Secondary | ICD-10-CM | POA: Diagnosis not present

## 2018-04-13 DIAGNOSIS — Y92009 Unspecified place in unspecified non-institutional (private) residence as the place of occurrence of the external cause: Secondary | ICD-10-CM

## 2018-04-13 MED ORDER — TRAMADOL HCL 50 MG PO TABS
50.0000 mg | ORAL_TABLET | Freq: Once | ORAL | Status: AC
  Administered 2018-04-13: 50 mg via ORAL
  Filled 2018-04-13: qty 1

## 2018-04-13 NOTE — ED Notes (Addendum)
Fell today whild tying shoe and landed on floor and now has pain in left hip.  Able to move, but area is tender to touch.  Patient is alert and in nad except when we removed her pants over that hip at which time she cried out.

## 2018-04-13 NOTE — Discharge Instructions (Addendum)
Follow-up with your primary care provider if any continued problems.  Today's x-rays show there is no hip fracture.  CT scan of your head and neck shows no acute injury as a result of your fall today.  Continue taking your medications at home as directed by your doctor.  Should any continued pain medication be needed you will need to contact your primary care provider.

## 2018-04-13 NOTE — ED Notes (Signed)
To x-ray via stretcher.

## 2018-04-13 NOTE — ED Triage Notes (Signed)
States was trying to get onto the bed and missed bed and fell to floor. States uses walker, has MS.

## 2018-04-13 NOTE — ED Provider Notes (Signed)
[facility] Emergency Department Provider Note   ____________________________________________   First MD Initiated Contact with Patient 04/13/18 1522     (approximate)  I have reviewed the triage vital signs and the nursing notes.   HISTORY  Chief Complaint Hip Pain   HPI [name] is a 62 y.o. female presents to the ED with complaint of left hip pain.  Patient states that she fell today while tying her shoe and landed on the floor with most of the injury to her left hip.  She states that she also believes that she hit her head when she fell off the bed.  She denies any loss of consciousness.  She states that she has frequent falls due to her MS.  Patient was brought to the ED by family and dropped off.  She rates her pain as a 9/10.  Past Medical History:  Diagnosis Date  . Headache   . Hyperlipidemia   . MS (multiple sclerosis) (HCC) 1990  . Neurogenic bladder     Patient Active Problem List   Diagnosis Date Noted  . Lower extremity edema 04/15/2017  . Macrocytic anemia 02/11/2017  . Vitamin D deficiency 02/02/2016  . Mixed hyperlipidemia 01/03/2015  . MS (multiple sclerosis) (HCC) 01/03/2015  . Chronic pain 01/03/2015  . GERD without esophagitis 01/03/2015  . COPD (chronic obstructive pulmonary disease) (HCC) 01/03/2015  . Osteopenia 01/03/2015  . Neurogenic bladder 01/03/2015  . Incontinence in female 01/03/2015    Past Surgical History:  Procedure Laterality Date  . ABDOMINAL HYSTERECTOMY    . OOPHORECTOMY Bilateral     Prior to Admission medications   Medication Sig Start Date End Date Taking? Authorizing Provider  albuterol (PROAIR HFA) 108 (90 Base) MCG/ACT inhaler USE 2 PUFFS EVERY SIX HOURS AS NEEDED FOR WHEEZING OR SHORTNESS OF BREATH 12/15/17   Karamalegos, Netta Neat, DO  albuterol (PROVENTIL) (2.5 MG/3ML) 0.083% nebulizer solution Take 3 mLs (2.5 mg total) by nebulization every 6 (six) hours as needed for wheezing or  shortness of breath. 02/25/17   Karamalegos, Netta Neat, DO  alendronate (FOSAMAX) 70 MG tablet Take 1 tablet (70 mg total) by mouth every 7 (seven) days. Take with a full glass of water on an empty stomach. 02/12/18   Karamalegos, Netta Neat, DO  D-5000 5000 units TABS Take 1 tablet (5,000 Units total) by mouth daily. For 12 weeks, then start Vitamin D3 2,000 units daily (OTC) 06/19/17   Karamalegos, Netta Neat, DO  dalfampridine (AMPYRA) 10 MG TB12 Take 10 mg by mouth 2 (two) times daily.    [provider]  Dimethyl Fumarate (TECFIDERA) 240 MG CPDR Take 240 mg by mouth 2 (two) times daily.    [provider]  gabapentin (NEURONTIN) 300 MG capsule Take 2 capsules (600 mg total) by mouth 3 (three) times daily. 02/12/18   Karamalegos, Netta Neat, DO  mirabegron ER (MYRBETRIQ) 50 MG TB24 tablet Take 1 tablet (50 mg total) by mouth daily. 02/12/18   Smitty Cords, DO  Misc. Devices (CANE) MISC 1 each by Does not apply route as needed. 05/29/15   Loura Pardon, NP  pantoprazole (PROTONIX) 40 MG tablet Take 1 tablet (40 mg total) by mouth daily before breakfast. 02/12/18   Althea Charon, Netta Neat, DO  pravastatin (PRAVACHOL) 40 MG tablet TAKE ONE (1) TABLET EACH DAY 07/24/17   Karamalegos, Alexander J, DO  SPIRIVA HANDIHALER 18 MCG inhalation capsule Place 1 capsule (18 mcg total) into inhaler and inhale daily.  09/13/17   Karamalegos, Netta Neat, DO  traMADol (ULTRAM) 50 MG tablet Take 1 tablet (50 mg total) by mouth daily as needed for moderate pain. Prescribed by Dr Malvin Johns Neurology 04/01/17   Smitty Cords, DO    Allergies Penicillins  Family History  Problem Relation Age of Onset  . Diabetes Mother   . Heart disease Mother   . Diabetes Father   . Heart disease Father   . Diabetes Sister   . Heart disease Sister     Social History Social History   Tobacco Use  . Smoking status: Former Smoker    Packs/day: 1.00    Years: 20.00    Pack years:  20.00    Last attempt to quit: 01/03/1995    Years since quitting: 23.2  . Smokeless tobacco: Never Used  Substance Use Topics  . Alcohol use: No    Alcohol/week: 0.0 standard drinks  . Drug use: Yes    Comment: smokes pods in past    Review of Systems Constitutional: No fever/chills Eyes: No visual changes. ENT: No trauma. Cardiovascular: Denies chest pain. Respiratory: Denies shortness of breath. Gastrointestinal: No abdominal pain.  No nausea, no vomiting.   Musculoskeletal: Negative for cervical, thoracic or lumbar pain.  Positive left hip pain. Skin: Negative for rash. Neurological: Negative for headaches, focal weakness or numbness. ____________________________________________   PHYSICAL EXAM:  VITAL SIGNS: ED Triage Vitals  Enc Vitals Group     BP 04/13/18 1504 130/77     Pulse Rate 04/13/18 1504 84     Resp 04/13/18 1504 18     Temp 04/13/18 1504 98 F (36.7 C)     Temp Source 04/13/18 1504 Oral     SpO2 04/13/18 1504 95 %     Weight 04/13/18 1505 140 lb (63.5 kg)     Height 04/13/18 1505 5\' 4"  (1.626 m)     Head Circumference --      Peak Flow --      Pain Score 04/13/18 1505 9     Pain Loc --      Pain Edu? --      Excl. in GC? --    Constitutional: Alert and oriented. Well appearing and in no acute distress. Eyes: Conjunctivae are normal. PERRL. EOMI. Head: Atraumatic. Nose: No trauma. Neck: No stridor.  No cervical tenderness on palpation posteriorly. Cardiovascular: Normal rate, regular rhythm. Grossly normal heart sounds.  Good peripheral circulation. Respiratory: Normal respiratory effort.  No retractions. Lungs CTAB. Gastrointestinal: Soft and nontender. No distention.  Musculoskeletal: Nontender thoracic or lumbar spine to palpation posteriorly.  No injuries noted to the upper extremities.  Range of motion is that restriction.  On examination of the left hip there is moderate tenderness on palpation laterally and posteriorly.  Range of motion is  restricted secondary to pain.  There is no rotation or shortening of the left extremity.  No gross deformity is noted.  Nontender left knee and ankle.  Patient moves right lower extremity without any difficulty and nontender to palpation. Neurologic:  Normal speech and language. No gross focal neurologic deficits are appreciated. Skin:  Skin is warm, dry and intact.  No ecchymosis or abrasions were noted. Psychiatric: Mood and affect are normal. Speech and behavior are normal.  ____________________________________________   LABS (all labs ordered are listed, but only abnormal results are displayed)  Labs Reviewed - No data to display  RADIOLOGY   Official radiology report(s): Ct Head Wo Contrast  Result Date: 04/13/2018 CLINICAL  DATA:  Fall from bed today, struck head. History of multiple sclerosis. EXAM: CT HEAD WITHOUT CONTRAST CT CERVICAL SPINE WITHOUT CONTRAST TECHNIQUE: Multidetector CT imaging of the head and cervical spine was performed following the standard protocol without intravenous contrast. Multiplanar CT image reconstructions of the cervical spine were also generated. COMPARISON:  MRI head September 29, 2017 FINDINGS: CT HEAD FINDINGS BRAIN: No intraparenchymal hemorrhage, mass effect nor midline shift. No acute large vascular territory infarct. Small area chronic RIGHT mesial frontal lobe encephalomalacia. Confluent supratentorial white matter hypodensities. Mild parenchymal brain volume loss. No hydrocephalus. No abnormal extra-axial fluid collections. VASCULAR: Unremarkable. SKULL/SOFT TISSUES: No skull fracture. No significant soft tissue swelling. ORBITS/SINUSES: The included ocular globes and orbital contents are normal.Trace paranasal sinus mucosal thickening. Mastoid air cells are well aerated. OTHER: None. CT CERVICAL SPINE FINDINGS ALIGNMENT: Straightened lordosis.  Vertebral bodies in alignment. SKULL BASE AND VERTEBRAE: Cervical vertebral bodies and posterior elements are  intact. Intervertebral disc heights preserved. No destructive bony lesions. C1-2 articulation maintained. SOFT TISSUES AND SPINAL CANAL: Nonacute. Mild calcific atherosclerosis of included arch vessel origins. DISC LEVELS: Multilevel small disc protrusions resulting in mild suspected canal stenosis. No osseous neural foraminal narrowing. UPPER CHEST: Lung apices are clear. OTHER: None. IMPRESSION: CT HEAD: 1. No acute intracranial process. 2. Severe white matter changes consistent with known demyelination. 3. Old small RIGHT ACA territory infarct versus chronic focal demyelination. CT CERVICAL SPINE: 1. No fracture or malalignment. 2. Multilevel small disc protrusions. Electronically Signed   By: Awilda Metro M.D.   On: 04/13/2018 15:56   Ct Cervical Spine Wo Contrast  Result Date: 04/13/2018 CLINICAL DATA:  Fall from bed today, struck head. History of multiple sclerosis. EXAM: CT HEAD WITHOUT CONTRAST CT CERVICAL SPINE WITHOUT CONTRAST TECHNIQUE: Multidetector CT imaging of the head and cervical spine was performed following the standard protocol without intravenous contrast. Multiplanar CT image reconstructions of the cervical spine were also generated. COMPARISON:  MRI head September 29, 2017 FINDINGS: CT HEAD FINDINGS BRAIN: No intraparenchymal hemorrhage, mass effect nor midline shift. No acute large vascular territory infarct. Small area chronic RIGHT mesial frontal lobe encephalomalacia. Confluent supratentorial white matter hypodensities. Mild parenchymal brain volume loss. No hydrocephalus. No abnormal extra-axial fluid collections. VASCULAR: Unremarkable. SKULL/SOFT TISSUES: No skull fracture. No significant soft tissue swelling. ORBITS/SINUSES: The included ocular globes and orbital contents are normal.Trace paranasal sinus mucosal thickening. Mastoid air cells are well aerated. OTHER: None. CT CERVICAL SPINE FINDINGS ALIGNMENT: Straightened lordosis.  Vertebral bodies in alignment. SKULL BASE AND  VERTEBRAE: Cervical vertebral bodies and posterior elements are intact. Intervertebral disc heights preserved. No destructive bony lesions. C1-2 articulation maintained. SOFT TISSUES AND SPINAL CANAL: Nonacute. Mild calcific atherosclerosis of included arch vessel origins. DISC LEVELS: Multilevel small disc protrusions resulting in mild suspected canal stenosis. No osseous neural foraminal narrowing. UPPER CHEST: Lung apices are clear. OTHER: None. IMPRESSION: CT HEAD: 1. No acute intracranial process. 2. Severe white matter changes consistent with known demyelination. 3. Old small RIGHT ACA territory infarct versus chronic focal demyelination. CT CERVICAL SPINE: 1. No fracture or malalignment. 2. Multilevel small disc protrusions. Electronically Signed   By: Awilda Metro M.D.   On: 04/13/2018 15:56   Dg Hip Unilat W Or Wo Pelvis 2-3 Views Left  Result Date: 04/13/2018 CLINICAL DATA:  62 year old female with left hip pain after falling earlier today. EXAM: DG HIP (WITH OR WITHOUT PELVIS) 2-3V LEFT COMPARISON:  None. FINDINGS: There is no evidence of hip fracture or dislocation. Mild degenerative osteoarthritis  with narrowing of the superolateral joint space. No lytic or blastic osseous lesion identified. Soft tissues are unremarkable. IMPRESSION: 1. No acute abnormality. 2. Mild hip joint osteoarthritis. Electronically Signed   By: Malachy Moan M.D.   On: 04/13/2018 16:08   ____________________________________________   PROCEDURES  Procedure(s) performed: None  Procedures  Critical Care performed: No  ____________________________________________   INITIAL IMPRESSION / ASSESSMENT AND PLAN / ED COURSE  As part of my medical decision making, I reviewed the following data within the electronic MEDICAL RECORD NUMBER Notes from prior ED visits and [place] Controlled Substance Database  62 year old female with history of MS presents to the ED with complaint of sliding out of the bed while she was  sitting tying her shoes.  She denies any loss of consciousness but states that she did hit her head on the floor during this event.  This was unwitnessed.  She denies any nausea, vomiting or visual changes.  She complains of left hip pain only.  Patient exam and history was taken in consideration.  CT head and neck were ordered to rule out acute injury since this was unwitnessed and no family members are present with the patient.  Left hip with pelvis is negative for acute injury.  Patient was reassured.  She has tramadol at home but was given one while in the ED for her pain.  She is to continue taking her medications at home as directed by her PCP.  She is encouraged to use ice to her hip as needed for discomfort.  She will follow-up with her PCP if any continued problems.  ____________________________________________   FINAL CLINICAL IMPRESSION(S) / ED DIAGNOSES  Final diagnoses:  Contusion of left hip, initial encounter  Fall at home, initial encounter     ED Discharge Orders    None       Note:  This document was prepared using Dragon voice recognition software and may include unintentional dictation errors.    Tommi Rumps, PA-C 04/13/18 1842    Minna Antis, MD 04/14/18 [phone]

## 2018-04-14 ENCOUNTER — Ambulatory Visit: Payer: Medicare Other | Admitting: Occupational Therapy

## 2018-04-14 ENCOUNTER — Encounter: Payer: Self-pay | Admitting: Physical Therapy

## 2018-04-14 ENCOUNTER — Ambulatory Visit: Payer: Medicare Other | Admitting: Physical Therapy

## 2018-04-14 ENCOUNTER — Encounter: Payer: Self-pay | Admitting: Occupational Therapy

## 2018-04-14 DIAGNOSIS — R278 Other lack of coordination: Secondary | ICD-10-CM | POA: Diagnosis not present

## 2018-04-14 DIAGNOSIS — R262 Difficulty in walking, not elsewhere classified: Secondary | ICD-10-CM

## 2018-04-14 DIAGNOSIS — M6281 Muscle weakness (generalized): Secondary | ICD-10-CM

## 2018-04-14 NOTE — Therapy (Addendum)
[place] [facility] MAIN [facility] SERVICES [address] [place], [place], [address] Phone: [phone]   Fax:  [phone]  Occupational Therapy Treatment  Patient Details  Name: [name] MRN: [id number] Date of Birth: [birth year] No data recorded  Encounter Date: 04/14/2018  OT End of Session - 04/14/18 1310    Visit Number  39    Number of Visits  48    Date for OT Re-Evaluation  07/02/18    Authorization Type  Visit 1 of 10 for progress report staring 04/14/2018    OT Start Time  1300    OT Stop Time  1345    OT Time Calculation (min)  45 min    Activity Tolerance  Patient tolerated treatment well    Behavior During Therapy  [facility] for tasks assessed/performed       Past Medical History:  Diagnosis Date  . Headache   . Hyperlipidemia   . MS (multiple sclerosis) (HCC) 1990  . Neurogenic bladder     Past Surgical History:  Procedure Laterality Date  . ABDOMINAL HYSTERECTOMY    . OOPHORECTOMY Bilateral     There were no vitals filed for this visit.  Subjective Assessment - 04/14/18 1300    Subjective  Pt reports not having a good day due to being in pain secondary to falling yesterday.    Pertinent History Patient reports she was diagnosed with MS years ago and has recently started to feel weak in her left dominant arm and hand.  She denies any previous therapy and reports memory issues and can provide a limited history.     Patient Stated Goals Patient reports she wants to be able to walk better, use her left hand and be able to write again.     Currently in Pain?  Yes    Pain Score  4     Pain Location  Leg    Pain Orientation  Left    Pain Descriptors / Indicators  Aching    Pain Type  Acute pain    Pain Onset  Yesterday    Pain Frequency  Other (Comment)   Typically presents with pain; experiencing pain due to fall   Multiple Pain Sites  No      OT TREATMENT  Neuromuscular re-education:  Pt worked on stacking x5 coins of  various sizes, before using her left hand to place them into a resistive container and push them through the slot while isolating her 2nd digit. Pt presents with increased tremors today and required increased time to align coins with the slot before pushing them through. Pt. frequently dropped coins when translating them from her palm to the tips of her fingers while using her left hand. Pt worked on [facility] grasping 1/2" small magnetic pegs, disconnecting them, and placing them on a whiteboard positioned at a vertical angle at a tabletop to encourage left hand [facility] skills with arms sustained in elevation. Pt worked on removing the pegs using a red resistive clip to improve pinch strength to prepare hands for functional use. Pt required increased time and tactile stabilization due to increased tremors today. Pt. Is working to improve [facility] skills to promote independence during ADLs such as donning and doffing earrings and manipulating buttons.                    OT Education - 04/14/18 1309    Education provided  Yes    Education Details  [facility] skills, hand  strengthening, writing    Person(s) Educated  Patient    Methods  Explanation;Demonstration;Verbal cues    Comprehension  Verbalized understanding;Verbal cues required;Returned demonstration          OT [place] Term Goals - 04/09/18 1458      OT [place] TERM GOAL #1   Title  Patient will demonstrate holding utensils in her left hand and feeding herself with modified independence with minimal spillage.      Baseline  04/09/2018: Pt spills >50% of food when using L hand. Pt is able to cut soft meats while using her fork.    Time  8    Period  Weeks    Status  On-going    Target Date  06/04/18      OT [place] TERM GOAL #2   Title  Patient will complete tub/shower transfer with supervision only.    Baseline  04/09/2018 Pt requires min A to lift legs over tub for transfers    Time  8    Period  Weeks    Status  On-going    Target Date   06/04/18      OT [place] TERM GOAL #4   Title  Patient will increase left UE strength by 1 mm grade to be able to reach into closet to retrieve clothing items.     Baseline  04/09/2018 - Pt LUE strength 4-/5. Pt can reach items in her closet independently    Time  12    Period  Weeks    Status  On-going    Target Date  07/02/18      OT Persons TERM GOAL #5   Title  Patient will improve coordination on the left to be able to hold pen securely and write her name with 75% legibility.     Baseline  04/09/2018 - Pt prints name with ~60% legibility using standard pen with a grip    Time  12    Period  Weeks    Status  On-going    Target Date  07/02/18      OT [place] TERM GOAL #6   Title  Patient will complete lower body dressing with modified independence using adaptive equipment if needed.     Baseline  04/09/2018 - Pt requires min A to don pants. Pt requires increased time to tie shoes independently.    Time  12    Period  Weeks    Status  On-going    Target Date  07/02/18            Plan - 04/14/18 1311    Clinical Impression Statement Pt presents today with increased left sided pain secondary to experiencing a fall at home yesterday. Pt reports falling while attempting to tie her shoes while sitting edge of bed. Pt presents with left hand [facility] deficits and decreased pinch strength. Pt worked on increasing [employer], strength, and writing legibility using her left hand. Pt presented while wearing one earring today and reported not being able to don left earring today due to increased tremors and decreased left hand motor control day. Increased left hand [facility] skills, strength, and legibility is required to increase independence during ADLs and IADLs.    Occupational Profile and client history currently impacting functional performance  patient is legally blind, progressive disease process with MS for over 20+ years, lives alone, limited availability for transportation.      Occupational  performance deficits (Please refer to evaluation for details):  ADL's;Leisure  Rehab Potential  Good    OT Frequency  2x / week    OT Duration  12 weeks    OT Treatment/Interventions  Therapeutic exercise;Cognitive remediation/compensation;Functional Mobility Training    Plan  Positive:  motivation, family support.  Negative:  lives alone, legally blind, requires caregiver 7 days a week for a few hours a day for self care    Clinical Decision Making  Several treatment options, min-mod task modification necessary    Consulted and Agree with Plan of Care  Patient       Patient will benefit from skilled therapeutic intervention in order to improve the following deficits and impairments:  Decreased cognition, Decreased knowledge of use of DME, Impaired vision/preception, Pain, Decreased coordination, Decreased activity tolerance, Decreased endurance, Decreased strength, Decreased balance, Impaired UE functional use  Visit Diagnosis: Other lack of coordination  Muscle weakness (generalized)    Problem List Patient Active Problem List   Diagnosis Date Noted  . Lower extremity edema 04/15/2017  . Macrocytic anemia 02/11/2017  . Vitamin D deficiency 02/02/2016  . Mixed hyperlipidemia 01/03/2015  . MS (multiple sclerosis) (HCC) 01/03/2015  . Chronic pain 01/03/2015  . GERD without esophagitis 01/03/2015  . COPD (chronic obstructive pulmonary disease) (HCC) 01/03/2015  . Osteopenia 01/03/2015  . Neurogenic bladder 01/03/2015  . Incontinence in female 01/03/2015    Ernesto Rutherford, OTS 04/14/2018, 1:16 PM   This entire session was performed under direct supervision and direction of a licensed therapist/therapist assistant . I have personally read, edited and approve of the note as written.  Olegario Messier, MS, OTR/L   [place] [facility] MAIN [facility] SERVICES [address] [place], [place], [address] Phone: [phone]   Fax:  [phone]  Name:  LARETTA PYATT MRN: [id number] Date of Birth: 1955/10/02

## 2018-04-14 NOTE — Therapy (Addendum)
[place] MAIN [facility] SERVICES [address] [place], [place], [address] Phone: [phone]   Fax:  [phone]  Physical Therapy Treatment  Patient Details  Name: [name] MRN: [id number] Date of Birth: [birth year] Referring Provider (PT): Anabel Bene   Encounter Date: 04/14/2018  PT End of Session - 04/14/18 1357    Visit Number  22    Number of Visits  25    Date for PT Re-Evaluation  05/27/18    Authorization Type  3/10    PT Start Time  1350    PT Stop Time  1430    PT Time Calculation (min)  40 min    Equipment Utilized During Treatment  Gait belt    Activity Tolerance  Patient tolerated treatment well    Behavior During Therapy  WFL for tasks assessed/performed       Past Medical History:  Diagnosis Date  . Headache   . Hyperlipidemia   . MS (multiple sclerosis) ([place]) 1990  . Neurogenic bladder     Past Surgical History:  Procedure Laterality Date  . ABDOMINAL HYSTERECTOMY    . OOPHORECTOMY Bilateral     There were no vitals filed for this visit.  Subjective Assessment - 04/14/18 1355    Subjective  Patient reports she had a fall yesterday; was sitting on side of bed tying shoe and leaned too far forward. She fell on her L side and went to the ED to get everything checked out. Pt did not have any injuries but is sore on the L side today; continues to have the same L leg pain as well.     Pertinent History  Patient reports about 3 months ago she started to feel weak in the left arm, reports she is legally blind.       How [place] can you stand comfortably?  not too [place]     Patient Stated Goals  Patient wants to be able to walk better.     Currently in Pain?  Yes    Pain Score  4     Pain Location  Leg    Pain Orientation  Left    Pain Descriptors / Indicators  Aching    Pain Type  Chronic pain    Pain Onset  More than a month ago    Pain Frequency  Constant    Aggravating Factors   prolonged standing    Pain Relieving Factors  sit down and rest    Effect of Pain on Daily Activities  "I try not to let it keep me from doing anything."     Multiple Pain Sites  No       Treatment Warm up on Nustep level 1 x5 min while taking history and discussing recent fall;   Leg press with BLE 90# x20 reps   Lunge onto BOSU ball x10 reps with BLE, VCs to bend forward knee and keeping back leg planted onto ground  Step ups to 6 inch stool x15 BLE with VCs to decrease UE support from BUE to single UE support, CGA for safety  Toe taps to 6 inch stool x15 reps, single UE support in // bars decreased to no UE support; demonstrated increased unsteadiness   Standing strengthening: -Hip abduction with red tband 2x15 reps -Squats x12 reps -Heel raises x20 reps  Sit <> stand with 2# dowel overhead lifts x10 reps, x5 reps   CGA-supervision throughout session for safety; VCs and demonstration required  for correct exercise technique and form       PT Education - 04/14/18 1357    Education Details  exercise technique/form    Person(s) Educated  Patient    Methods  Explanation;Demonstration;Verbal cues    Comprehension  Verbalized understanding;Returned demonstration;Verbal cues required;Need further instruction       PT Short Term Goals - 03/16/18 1521      PT SHORT TERM GOAL #1   Title  Patient will be independent in home exercise program to improve strength/mobility for better functional independence with ADLs.    Time  6    Period  Weeks    Status  On-going      PT SHORT TERM GOAL #2   Title  nt (< 68 years old) will complete five times sit to stand test in < 10 seconds indicating an increased LE strength and improved balance.    Baseline  02/11/18= 19.38; 03/16/18: 18.0s    Time  6    Period  Weeks    Status  On-going        PT [place] Term Goals - 04/01/18 1356      PT Markiewicz TERM GOAL #1   Title  Patient will increase six minute walk test distance to >1000 for progression to community  ambulator and improve gait ability    Baseline  02/11/18= 500 feet; 03/16/18: 830 feet,  04/01/18= 515    Time  12    Period  Weeks    Status  Partially Met    Target Date  05/27/18      PT Mihalic TERM GOAL #2   Title  Patient will increase 10 meter walk test to >1.87ms as to improve gait speed for better community ambulation and to reduce fall risk.    Baseline  02/11/18 . 68 m/sec; 03/16/18: self-selected: 12.6s= 0.79 m/s fastest: 10.4s = 0.96 m/s, 04/01/18= .1.0 m/sec    Time  12    Period  Weeks    Status  Partially Met    Target Date  05/27/18      PT Hayashi TERM GOAL #3   Title  Patient will reduce timed up and go to <11 seconds to reduce fall risk and demonstrate improved transfer/gait ability.    Baseline  17.49 sec 02/11/18; 03/16/18: 16.8s, 04/01/18= 15.9 sec    Time  12    Period  Weeks    Status  Partially Met    Target Date  05/27/18      PT Elahi TERM GOAL #4   Title  Patient (> 647years old) will complete five times sit to stand test in < 15 seconds indicating an increased LE strength and improved balance.    Baseline  19.38sec 02/11/18; 03/16/18: 18.0s, 04/01/18= 17.04 sec    Time  12    Period  Weeks    Status  Partially Met    Target Date  05/27/18            Plan - 04/14/18 1425    Clinical Impression Statement  Patient tolerated therapy session well. Pt continues to demonstrate increased difficulty with dynamic balance tasks; requires VCs to encourage decreased UE support. Pt performed toe taps onto step without UE support but did demonstrate increased unsteadiness and sway; CGA required. Pt required VCs for proper exercise technique and sequencing. Pt will continue to benefit from skilled PT intervention for improvements in strength, balance, and gait safety.    PT Frequency  2x / week  PT Duration  8 weeks    PT Treatment/Interventions  Gait training;Therapeutic exercise;Therapeutic activities;Functional mobility training;Neuromuscular re-education;Patient/family  education;Balance training    Consulted and Agree with Plan of Care  Patient       Patient will benefit from skilled therapeutic intervention in order to improve the following deficits and impairments:  Abnormal gait, Decreased balance, Decreased endurance, Postural dysfunction, Impaired flexibility, Decreased strength, Decreased safety awareness, Decreased activity tolerance, Decreased range of motion, Decreased knowledge of precautions, Impaired sensation, Difficulty walking  Visit Diagnosis: Muscle weakness (generalized)  Difficulty in walking, not elsewhere classified     Problem List Patient Active Problem List   Diagnosis Date Noted  . Lower extremity edema 04/15/2017  . Macrocytic anemia 02/11/2017  . Vitamin D deficiency 02/02/2016  . Mixed hyperlipidemia 01/03/2015  . MS (multiple sclerosis) ([place]) 01/03/2015  . Chronic pain 01/03/2015  . GERD without esophagitis 01/03/2015  . COPD (chronic obstructive pulmonary disease) ([place]) 01/03/2015  . Osteopenia 01/03/2015  . Neurogenic bladder 01/03/2015  . Incontinence in female 01/03/2015   Harriet Masson, SPT This entire session was performed under direct supervision and direction of a licensed therapist/therapist assistant . I have personally read, edited and approve of the note as written.  Trotter,Margaret  PT, DPT 04/15/2018, 11:10 AM  [place] MAIN [facility] SERVICES [address] [place], [place], [address] Phone: [phone]   Fax:  [phone]  Name: [name] MRN: [id number] Date of Birth: [birth year]

## 2018-04-16 ENCOUNTER — Ambulatory Visit: Payer: Medicare Other | Admitting: Occupational Therapy

## 2018-04-16 ENCOUNTER — Ambulatory Visit: Payer: Medicare Other | Admitting: Physical Therapy

## 2018-04-16 ENCOUNTER — Encounter: Payer: Self-pay | Admitting: Physical Therapy

## 2018-04-16 ENCOUNTER — Encounter: Payer: Self-pay | Admitting: Occupational Therapy

## 2018-04-16 DIAGNOSIS — R262 Difficulty in walking, not elsewhere classified: Secondary | ICD-10-CM | POA: Diagnosis not present

## 2018-04-16 DIAGNOSIS — M6281 Muscle weakness (generalized): Secondary | ICD-10-CM | POA: Diagnosis not present

## 2018-04-16 DIAGNOSIS — R278 Other lack of coordination: Secondary | ICD-10-CM

## 2018-04-16 NOTE — Therapy (Addendum)
[place] [facility] MAIN [facility] SERVICES [address] [place], [place], [address] Phone: [phone]   Fax:  [phone]  Occupational Therapy Treatment  Patient Details  Name: [name] MRN: [id number] Date of Birth: 1955/09/03 No data recorded  Encounter Date: 04/16/2018  OT End of Session - 04/16/18 1156    Visit Number  40    Number of Visits  48    Date for OT Re-Evaluation  07/02/18    Authorization Type  Visit 2 of 10 for progress report staring 04/14/2018    OT Start Time  1145    OT Stop Time  1230    OT Time Calculation (min)  45 min    Activity Tolerance  Patient tolerated treatment well    Behavior During Therapy  [facility] for tasks assessed/performed       Past Medical History:  Diagnosis Date  . Headache   . Hyperlipidemia   . MS (multiple sclerosis) ([facility]) 1990  . Neurogenic bladder     Past Surgical History:  Procedure Laterality Date  . ABDOMINAL HYSTERECTOMY    . OOPHORECTOMY Bilateral     There were no vitals filed for this visit.  Subjective Assessment - 04/16/18 1154    Subjective   Pt reports that she is doing well today and is starting to feel a little better after her fall.    Pertinent History  Patient reports she was diagnosed with MS years ago and has recently started to feel weak in her left dominant arm and hand.  She denies any previous therapy and reports memory issues and can provide a limited history.     Patient Stated Goals  Patient reports she wants to be able to walk better, use her left hand and be able to write again.     Currently in Pain?  Yes    Pain Score  4     Pain Location  Leg    Pain Orientation  Left    Pain Descriptors / Indicators  Aching    Pain Type  Chronic pain    Pain Onset  More than a month ago    Pain Frequency  Constant    Multiple Pain Sites  No      OT TREATMENT  Neuromuscular re-education:  Pt. worked on LUE motor control, and coordination skills needed to grasp 1/2"  washers and place them on a vertical dowel tower, with dowels positioned at multiple angles. Pt demonstrated decreased motor control when reaching laterally to place washers. Pt required increased time to accurately reach for targets. Pt. Worked on pinch strengthening in the left hand for lateral, and 3pt. pinch using red and green resistive clips. Pt. worked on placing the clips at a horizontal angle. Tactile and verbal cues were required for eliciting the desired movement. Pt demonstrated increased hand fatigue as the task progressed and required x2 short rest breaks.  Self-care/IADLs:   Pt. worked on Careers information officer and speed. Pt. was able to write name x10 in cursive and x8 in print. Pt. used a mature hand grasp with a standard pen with a larger grip. Pt used unlined paper and demonstrated no deviations from the line and slight deviations from the left margin. Pt. was able to hold the pen for the entire exercise. Pt's legibility decreased as the task progressed.                     OT Education - 04/16/18 1156  Education provided  Yes    Education Details  handwriting, hand strengthening, [facility] skills    Person(s) Educated  Patient    Methods  Explanation;Demonstration;Verbal cues    Comprehension  Verbalized understanding;Verbal cues required;Returned demonstration          OT [place] Term Goals - 04/09/18 1458      OT Selner TERM GOAL #1   Title  Patient will demonstrate holding utensils in her left hand and feeding herself with modified independence with minimal spillage.      Baseline  04/09/2018: Pt spills >50% of food when using L hand. Pt is able to cut soft meats while using her fork.    Time  8    Period  Weeks    Status  On-going    Target Date  06/04/18      OT Hoston TERM GOAL #2   Title  Patient will complete tub/shower transfer with supervision only.    Baseline  04/09/2018 Pt requires min A to lift legs over tub for transfers    Time  8    Period   Weeks    Status  On-going    Target Date  06/04/18      OT Shaffer TERM GOAL #4   Title  Patient will increase left UE strength by 1 mm grade to be able to reach into closet to retrieve clothing items.     Baseline  04/09/2018 - Pt LUE strength 4-/5. Pt can reach items in her closet independently    Time  12    Period  Weeks    Status  On-going    Target Date  07/02/18      OT Butterbaugh TERM GOAL #5   Title  Patient will improve coordination on the left to be able to hold pen securely and write her name with 75% legibility.     Baseline  04/09/2018 - Pt prints name with ~60% legibility using standard pen with a grip    Time  12    Period  Weeks    Status  On-going    Target Date  07/02/18      OT Dibenedetto TERM GOAL #6   Title  Patient will complete lower body dressing with modified independence using adaptive equipment if needed.     Baseline  04/09/2018 - Pt requires min A to don pants. Pt requires increased time to tie shoes independently.    Time  12    Period  Weeks    Status  On-going    Target Date  07/02/18            Plan - 04/16/18 1157    Clinical Impression Statement  Pt presents today with decreased [facility] skills that is affecting her handwriting and decreased hand strength. Pt is working on improving her handwriting while using print and cursive forms. Pt has greater difficulty writing legibly when using cursive. Pt is working to increase handwriting legibility while using standard writing tools and unlined paper. Increased handwriting legibility supports pt's independence and confidence during ADLs and IADLs.    Occupational Profile and client history currently impacting functional performance  patient is legally blind, progressive disease process with MS for over 20+ years, lives alone, limited availability for transportation.      Occupational performance deficits (Please refer to evaluation for details):  ADL's;Leisure    Rehab Potential  Good    OT Frequency  2x / week     OT Duration  12  weeks    OT Treatment/Interventions  Therapeutic exercise;Cognitive remediation/compensation;Functional Mobility Training    Plan  Positive:  motivation, family support.  Negative:  lives alone, legally blind, requires caregiver 7 days a week for a few hours a day for self care    Clinical Decision Making  Several treatment options, min-mod task modification necessary    Consulted and Agree with Plan of Care  Patient       Patient will benefit from skilled therapeutic intervention in order to improve the following deficits and impairments:  Decreased cognition, Decreased knowledge of use of DME, Impaired vision/preception, Pain, Decreased coordination, Decreased activity tolerance, Decreased endurance, Decreased strength, Decreased balance, Impaired UE functional use  Visit Diagnosis: Muscle weakness (generalized)  Other lack of coordination    Problem List Patient Active Problem List   Diagnosis Date Noted  . Lower extremity edema 04/15/2017  . Macrocytic anemia 02/11/2017  . Vitamin D deficiency 02/02/2016  . Mixed hyperlipidemia 01/03/2015  . MS (multiple sclerosis) ([facility]) 01/03/2015  . Chronic pain 01/03/2015  . GERD without esophagitis 01/03/2015  . COPD (chronic obstructive pulmonary disease) ([facility]) 01/03/2015  . Osteopenia 01/03/2015  . Neurogenic bladder 01/03/2015  . Incontinence in female 01/03/2015    Ernesto Rutherford, OTS 04/16/2018, 12:20 PM   This entire session was performed under direct supervision and direction of a licensed therapist/therapist assistant . I have personally read, edited and approve of the note as written.  Olegario Messier, MS, OTR/L   [place] [facility] MAIN [facility] SERVICES [address] [place], [place], [address] Phone: [phone]   Fax:  [phone]  Name: [name] MRN: [id number] Date of Birth: 09/16/1955

## 2018-04-16 NOTE — Therapy (Signed)
[place] MAIN [facility] SERVICES [address] [place], [place], [address] Phone: [phone]   Fax:  [phone]  Physical Therapy Treatment  Patient Details  Name: [name] MRN: [id number] Date of Birth: 1955/09/08 Referring Provider (PT): Anabel Bene   Encounter Date: 04/16/2018  PT End of Session - 04/16/18 1319    Visit Number  23    Number of Visits  25    Date for PT Re-Evaluation  05/27/18    Authorization Type  4/10    PT Start Time  0105    PT Stop Time  0145    PT Time Calculation (min)  40 min    Equipment Utilized During Treatment  Gait belt    Activity Tolerance  Patient tolerated treatment well    Behavior During Therapy  WFL for tasks assessed/performed       Past Medical History:  Diagnosis Date  . Headache   . Hyperlipidemia   . MS (multiple sclerosis) ([place]) 1990  . Neurogenic bladder     Past Surgical History:  Procedure Laterality Date  . ABDOMINAL HYSTERECTOMY    . OOPHORECTOMY Bilateral     There were no vitals filed for this visit.  Subjective Assessment - 04/16/18 1313    Subjective  Patient is still sore from falling off her bed.     Pertinent History  Patient reports about 3 months ago she started to feel weak in the left arm, reports she is legally blind.       How [place] can you stand comfortably?  not too [place]     Patient Stated Goals  Patient wants to be able to walk better.     Currently in Pain?  Yes    Pain Score  4     Pain Location  Hip    Pain Orientation  Left    Pain Descriptors / Indicators  Aching;Sore    Pain Onset  More than a month ago       Treatment:  Leg press x 100 lbs x 20 x 2 Seated with green tband around BLE: hip flexion march x15 bilaterally; hip abduction x15 bilaterally; LAQ x15 bilaterally;ankle DF x15 bilaterally; Patient required min verbal/tactile cues for correct exercise technique with cues for tband placement and to increase ROM for better  strengthening;    Standing in parallel bars: Standing on airex: toe taps to 4 inch step with 2-1 rail assist x15 reps bilaterally; standing one foot on step, Alternate BUE ball pass side/side x5 reps each foot on step; mini squat unsupported x10 reps; heel/toe raises x15 reps with B rail assist for balance; Alternate march x15 bilaterally with cues to increase hip flexion for better ROM/strength; staggered stance, head turns side/side, up/down x5 reps each, each foot in front;    Patient tolerated session well; denies any pain or fatigue at end of session                               PT Education - 04/16/18 1319    Education Details  HEP    Person(s) Educated  Patient    Methods  Explanation;Demonstration    Comprehension  Verbalized understanding;Returned demonstration;Verbal cues required;Need further instruction       PT Short Term Goals - 03/16/18 1521      PT SHORT TERM GOAL #1   Title  Patient will be independent in home exercise program  to improve strength/mobility for better functional independence with ADLs.    Time  6    Period  Weeks    Status  On-going      PT SHORT TERM GOAL #2   Title  nt (62 years old) will complete five times sit to stand test in < 10 seconds indicating an increased LE strength and improved balance.    Baseline  02/11/18= 19.38; 03/16/18: 18.0s    Time  6    Period  Weeks    Status  On-going        PT [place] Term Goals - 04/01/18 1356      PT G[place]a TERM GOAL #1   Title  Patient will increase six minute walk test distance to >1000 for progression to community ambulator and improve gait ability    Baseline  02/11/18= 500 feet; 03/16/18: 830 feet,  04/01/18= 515    Time  12    Period  Weeks    Status  Partially Met    Target Date  05/27/18      PT Stum TERM GOAL #2   Title  Patient will increase 10 meter walk test to >1.76ms as to improve gait speed for better community ambulation and to reduce fall risk.     Baseline  02/11/18 . 68 m/sec; 03/16/18: self-selected: 12.6s= 0.79 m/s fastest: 10.4s = 0.96 m/s, 04/01/18= .1.0 m/sec    Time  12    Period  Weeks    Status  Partially Met    Target Date  05/27/18      PT Sedlar TERM GOAL #3   Title  Patient will reduce timed up and go to <11 seconds to reduce fall risk and demonstrate improved transfer/gait ability.    Baseline  17.49 sec 02/11/18; 03/16/18: 16.8s, 04/01/18= 15.9 sec    Time  12    Period  Weeks    Status  Partially Met    Target Date  05/27/18      PT Soto TERM GOAL #4   Title  Patient (> 61years old) will complete five times sit to stand test in < 15 seconds indicating an increased LE strength and improved balance.    Baseline  19.38sec 02/11/18; 03/16/18: 18.0s, 04/01/18= 17.04 sec    Time  12    Period  Weeks    Status  Partially Met    Target Date  05/27/18            Plan - 04/16/18 1321    Clinical Impression Statement  Pt requires direction and verbal cues for correct performance of exercise and activities. Patient demonstrates difficulties strengthening against gravity and closed chain activities and has no reports of pain increases . Pt was able to perform all exercises with min assist and VC for technique.   Patient struggles with fatigue and repetitions.  Pt encouraged continuing HEP. Follow-up as scheduled    PT Frequency  2x / week    PT Duration  8 weeks    PT Treatment/Interventions  Gait training;Therapeutic exercise;Therapeutic activities;Functional mobility training;Neuromuscular re-education;Patient/family education;Balance training    Consulted and Agree with Plan of Care  Patient       Patient will benefit from skilled therapeutic intervention in order to improve the following deficits and impairments:  Abnormal gait, Decreased balance, Decreased endurance, Postural dysfunction, Impaired flexibility, Decreased strength, Decreased safety awareness, Decreased activity tolerance, Decreased range of motion, Decreased  knowledge of precautions, Impaired sensation, Difficulty walking  Visit Diagnosis: Muscle  weakness (generalized)  Other lack of coordination  Difficulty in walking, not elsewhere classified     Problem List Patient Active Problem List   Diagnosis Date Noted  . Lower extremity edema 04/15/2017  . Macrocytic anemia 02/11/2017  . Vitamin D deficiency 02/02/2016  . Mixed hyperlipidemia 01/03/2015  . MS (multiple sclerosis) ([place]) 01/03/2015  . Chronic pain 01/03/2015  . GERD without esophagitis 01/03/2015  . COPD (chronic obstructive pulmonary disease) ([place]) 01/03/2015  . Osteopenia 01/03/2015  . Neurogenic bladder 01/03/2015  . Incontinence in female 01/03/2015    Alanson Puls, PT DPT 04/16/2018, 1:23 PM  [place] MAIN [facility] SERVICES [address] [place], [place], [address] Phone: [phone]   Fax:  [phone]  Name: HENLI HEY MRN: [id number] Date of Birth: 01/15/1956

## 2018-04-20 ENCOUNTER — Other Ambulatory Visit: Payer: Self-pay | Admitting: Family Medicine

## 2018-04-20 DIAGNOSIS — J432 Centrilobular emphysema: Secondary | ICD-10-CM

## 2018-04-21 ENCOUNTER — Ambulatory Visit: Payer: Medicare Other | Admitting: Physical Therapy

## 2018-04-21 ENCOUNTER — Encounter: Payer: Self-pay | Admitting: Occupational Therapy

## 2018-04-21 ENCOUNTER — Encounter: Payer: Self-pay | Admitting: Physical Therapy

## 2018-04-21 ENCOUNTER — Ambulatory Visit: Payer: Medicare Other | Admitting: Occupational Therapy

## 2018-04-21 DIAGNOSIS — R278 Other lack of coordination: Secondary | ICD-10-CM

## 2018-04-21 DIAGNOSIS — M6281 Muscle weakness (generalized): Secondary | ICD-10-CM

## 2018-04-21 DIAGNOSIS — R262 Difficulty in walking, not elsewhere classified: Secondary | ICD-10-CM | POA: Diagnosis not present

## 2018-04-21 NOTE — Therapy (Addendum)
[facility] MAIN [facility] SERVICES [address] [place], [place], [address] Phone: [phone]   Fax:  [phone]  Occupational Therapy Treatment  Patient Details  Name: [name] MRN: [id number] Date of Birth: Dec 02, 1955 No data recorded  Encounter Date: 04/21/2018  OT End of Session - 04/21/18 1138    Visit Number  41    Number of Visits  48    Date for OT Re-Evaluation  07/02/17    Authorization Type  Visit 3 of 10 for progress report starting 04/14/2018    OT Start Time  1100    OT Stop Time  1145    OT Time Calculation (min)  45 min    Activity Tolerance  Patient tolerated treatment well    Behavior During Therapy  [facility] for tasks assessed/performed       Past Medical History:  Diagnosis Date  . Headache   . Hyperlipidemia   . MS (multiple sclerosis) (HCC) 1990  . Neurogenic bladder     Past Surgical History:  Procedure Laterality Date  . ABDOMINAL HYSTERECTOMY    . OOPHORECTOMY Bilateral     There were no vitals filed for this visit.  Subjective Assessment - 04/21/18 1125    Subjective   Pt reports she is having a good day and feeling accomplished after signing her name legibily when signing in for treatment today.    Pertinent History  Patient reports she was diagnosed with MS years ago and has recently started to feel weak in her left dominant arm and hand.  She denies any previous therapy and reports memory issues and can provide a limited history.     Patient Stated Goals  Patient reports she wants to be able to walk better, use her left hand and be able to write again.     Currently in Pain?  Yes    Pain Score  4     Pain Location  Hip    Pain Orientation  Left    Pain Descriptors / Indicators  Aching;Sore    Pain Type  Chronic pain    Pain Onset  More than a month ago    Pain Frequency  Constant    Multiple Pain Sites  No       OT TREATMENT  Neuromuscular re-education:   Pt. worked on LUE motor control,  and coordination skills needed to grasp 1/2" washers and place them on a vertical dowel tower, with dowels positioned at multiple angles. Pt demonstrated decreased motor control when reaching laterally to place washers. Pt required increased time to accurately reach for targets. Pt presents with increased tremors today. Pt. worked on pinch strengthening in the left hand for lateral, and 3pt. pinch using red and green resistive clips. Pt. worked on placing the clips at a vertical angle. Pt demonstrated less coordination and increased tremors as she reached higher. Tactile and verbal cues were required for eliciting the desired movement. Pt demonstrated increased hand fatigue as the task progressed and required x2 short rest breaks.   Self-care/IADLs:   Pt. worked on Careers information officer and speed. Pt. was able to write x12 different words based on prompts provided by therapist. Pt. used a mature hand grasp with a standard pen with a larger grip. Pt used unlined paper and demonstrated no deviations from the line nor margin. Pt. was able to hold the pen for the entire exercise. Pt demonstrated decreased legibility and required increased verbal cuing when writing words that were  less familiar than her name today.                 OT Education - 04/21/18 1129    Education provided  Yes    Education Details  handwriting, hand strengthening, [facility] skills    Person(s) Educated  Patient    Methods  Explanation;Demonstration;Verbal cues    Comprehension  Verbalized understanding;Verbal cues required;Returned demonstration          OT Mihalko Term Goals - 04/09/18 1458      OT Berrie TERM GOAL #1   Title  Patient will demonstrate holding utensils in her left hand and feeding herself with modified independence with minimal spillage.      Baseline  04/09/2018: Pt spills >50% of food when using L hand. Pt is able to cut soft meats while using her fork.    Time  8    Period  Weeks    Status  On-going     Target Date  06/04/18      OT Nies TERM GOAL #2   Title  Patient will complete tub/shower transfer with supervision only.    Baseline  04/09/2018 Pt requires min A to lift legs over tub for transfers    Time  8    Period  Weeks    Status  On-going    Target Date  06/04/18      OT Pigeon TERM GOAL #4   Title  Patient will increase left UE strength by 1 mm grade to be able to reach into closet to retrieve clothing items.     Baseline  04/09/2018 - Pt LUE strength 4-/5. Pt can reach items in her closet independently    Time  12    Period  Weeks    Status  On-going    Target Date  07/02/18      OT Beaubien TERM GOAL #5   Title  Patient will improve coordination on the left to be able to hold pen securely and write her name with 75% legibility.     Baseline  04/09/2018 - Pt prints name with ~60% legibility using standard pen with a grip    Time  12    Period  Weeks    Status  On-going    Target Date  07/02/18      OT Minnich TERM GOAL #6   Title  Patient will complete lower body dressing with modified independence using adaptive equipment if needed.     Baseline  04/09/2018 - Pt requires min A to don pants. Pt requires increased time to tie shoes independently.    Time  12    Period  Weeks    Status  On-going    Target Date  07/02/18            Plan - 04/21/18 1139    Clinical Impression Statement Pt presents today with decreased [facility] skills in her left hand that affects her daily tasks including handwriting and self-care. Pt presents with decreased left hand strength that impacts her while opening containers and pulling up her pants. Pt is experiencing increased tremors today and is having difficulty steadying her hand to while writing in print. Pt continues to work to increase hand muscle strength and [facility] skills to improve handwriting legibility and independence.    Occupational Profile and client history currently impacting functional performance  patient is legally blind,  progressive disease process with MS for over 20+ years, lives alone, limited availability for transportation.  Occupational performance deficits (Please refer to evaluation for details):  ADL's;Leisure    Rehab Potential  Good    OT Frequency  2x / week    OT Duration  12 weeks    OT Treatment/Interventions  Therapeutic exercise;Cognitive remediation/compensation;Functional Mobility Training    Plan  Positive:  motivation, family support.  Negative:  lives alone, legally blind, requires caregiver 7 days a week for a few hours a day for self care    Clinical Decision Making  Several treatment options, min-mod task modification necessary    Consulted and Agree with Plan of Care  Patient       Patient will benefit from skilled therapeutic intervention in order to improve the following deficits and impairments:  Decreased cognition, Decreased knowledge of use of DME, Impaired vision/preception, Pain, Decreased coordination, Decreased activity tolerance, Decreased endurance, Decreased strength, Decreased balance, Impaired UE functional use  Visit Diagnosis: Other lack of coordination  Muscle weakness (generalized)    Problem List Patient Active Problem List   Diagnosis Date Noted  . Lower extremity edema 04/15/2017  . Macrocytic anemia 02/11/2017  . Vitamin D deficiency 02/02/2016  . Mixed hyperlipidemia 01/03/2015  . MS (multiple sclerosis) (HCC) 01/03/2015  . Chronic pain 01/03/2015  . GERD without esophagitis 01/03/2015  . COPD (chronic obstructive pulmonary disease) (HCC) 01/03/2015  . Osteopenia 01/03/2015  . Neurogenic bladder 01/03/2015  . Incontinence in female 01/03/2015    Ernesto Rutherford, OTS 04/21/2018, 11:45 AM   This entire session was performed under direct supervision and direction of a licensed therapist/therapist assistant . I have personally read, edited and approve of the note as written.  Olegario Messier, MS, OTR/L   [facility] MAIN [facility] SERVICES [address] [place], [place], [address] Phone: [phone]   Fax:  [phone]  Name: [name] MRN: [id number] Date of Birth: 07-29-1955

## 2018-04-21 NOTE — Therapy (Signed)
[employer] MAIN [facility] SERVICES [address] [place], [place], [address] Phone: [phone]   Fax:  [phone]  Physical Therapy Treatment  Patient Details  Name: [name] MRN: [id number] Date of Birth: [birth year] Referring Provider (PT): Anabel Bene   Encounter Date: 04/21/2018  PT End of Session - 04/21/18 1200    Visit Number  25    Number of Visits  41    Date for PT Re-Evaluation  05/27/18    Authorization Type  5/10    PT Start Time  1145    PT Stop Time  1225    PT Time Calculation (min)  40 min    Equipment Utilized During Treatment  Gait belt    Activity Tolerance  Patient tolerated treatment well    Behavior During Therapy  WFL for tasks assessed/performed       Past Medical History:  Diagnosis Date  . Headache   . Hyperlipidemia   . MS (multiple sclerosis) ([place]) 1990  . Neurogenic bladder     Past Surgical History:  Procedure Laterality Date  . ABDOMINAL HYSTERECTOMY    . OOPHORECTOMY Bilateral     There were no vitals filed for this visit.  Subjective Assessment - 04/21/18 1158    Subjective  Patient is still sore from falling off her bed, but she is better today.     Pertinent History  Patient reports about 3 months ago she started to feel weak in the left arm, reports she is legally blind.       How Lalone can you stand comfortably?  not too [place]     Patient Stated Goals  Patient wants to be able to walk better.     Currently in Pain?  No/denies    Pain Score  0-No pain    Pain Onset  More than a month ago       Neuromuscular Re-education  Octane fitness x 5 mins  TM walking x 6 mins 1. 2 miles / hour , elevation 2  Alternating toe taps to 6 inch step with 1 rail assist x 10 reps bilaterally; Step-ups to 6" step alternating LE x 10 each; Heel/toe rocking x 10 reps without UE assist; Mini squat without UE support x 10reps; Semi tandem stance alternating forward LE 2 x 30s each without UE  support; Hurdle steps alternating LE x 10 each with single UE support, attempted without UE support however pt is unable to perform;   Pt educated throughout session about proper posture and technique with exercises. Improved exercise technique, movement at target joints, use of target muscles after min to mod verbal, visual, tactile cues.                         PT Education - 04/21/18 1200    Education Details  HEP, saftey    Person(s) Educated  Patient    Methods  Explanation    Comprehension  Verbalized understanding       PT Short Term Goals - 03/16/18 1521      PT SHORT TERM GOAL #1   Title  Patient will be independent in home exercise program to improve strength/mobility for better functional independence with ADLs.    Time  6    Period  Weeks    Status  On-going      PT SHORT TERM GOAL #2   Title  nt (< 4 years old) will complete five  times sit to stand test in < 10 seconds indicating an increased LE strength and improved balance.    Baseline  02/11/18= 19.38; 03/16/18: 18.0s    Time  6    Period  Weeks    Status  On-going        PT Hogsett Term Goals - 04/01/18 1356      PT Auriemma TERM GOAL #1   Title  Patient will increase six minute walk test distance to >1000 for progression to community ambulator and improve gait ability    Baseline  02/11/18= 500 feet; 03/16/18: 830 feet,  04/01/18= 515    Time  12    Period  Weeks    Status  Partially Met    Target Date  05/27/18      PT Halbur TERM GOAL #2   Title  Patient will increase 10 meter walk test to >1.43ms as to improve gait speed for better community ambulation and to reduce fall risk.    Baseline  02/11/18 . 68 m/sec; 03/16/18: self-selected: 12.6s= 0.79 m/s fastest: 10.4s = 0.96 m/s, 04/01/18= .1.0 m/sec    Time  12    Period  Weeks    Status  Partially Met    Target Date  05/27/18      PT Villamor TERM GOAL #3   Title  Patient will reduce timed up and go to <11 seconds to reduce fall risk and  demonstrate improved transfer/gait ability.    Baseline  17.49 sec 02/11/18; 03/16/18: 16.8s, 04/01/18= 15.9 sec    Time  12    Period  Weeks    Status  Partially Met    Target Date  05/27/18      PT Bezold TERM GOAL #4   Title  Patient (> 676years old) will complete five times sit to stand test in < 15 seconds indicating an increased LE strength and improved balance.    Baseline  19.38sec 02/11/18; 03/16/18: 18.0s, 04/01/18= 17.04 sec    Time  12    Period  Weeks    Status  Partially Met    Target Date  05/27/18            Plan - 04/21/18 1201    Clinical Impression Statement  Pt presents with unsteadiness on uneven surfaces and fatigues with therapeutic exercises. Patient needs assist with instruction for balance with side stepping on uneven surfaces and needs CGA assist with balance activities. Patient demonstrates difficulty with side stepping, motor control for BLE foot placement, and navigating small spaces with decreased base of support and increased challenges for LE.  Patient tolerated all interventions well this date and will benefit from continued skilled PT interventions to improve strength and balance and decrease risk of falling    PT Frequency  2x / week    PT Duration  8 weeks    PT Treatment/Interventions  Gait training;Therapeutic exercise;Therapeutic activities;Functional mobility training;Neuromuscular re-education;Patient/family education;Balance training    Consulted and Agree with Plan of Care  Patient       Patient will benefit from skilled therapeutic intervention in order to improve the following deficits and impairments:  Abnormal gait, Decreased balance, Decreased endurance, Postural dysfunction, Impaired flexibility, Decreased strength, Decreased safety awareness, Decreased activity tolerance, Decreased range of motion, Decreased knowledge of precautions, Impaired sensation, Difficulty walking  Visit Diagnosis: Muscle weakness (generalized)  Other lack of  coordination  Difficulty in walking, not elsewhere classified     Problem List Patient Active Problem List  Diagnosis Date Noted  . Lower extremity edema 04/15/2017  . Macrocytic anemia 02/11/2017  . Vitamin D deficiency 02/02/2016  . Mixed hyperlipidemia 01/03/2015  . MS (multiple sclerosis) ([place]) 01/03/2015  . Chronic pain 01/03/2015  . GERD without esophagitis 01/03/2015  . COPD (chronic obstructive pulmonary disease) ([place]) 01/03/2015  . Osteopenia 01/03/2015  . Neurogenic bladder 01/03/2015  . Incontinence in female 01/03/2015    Alanson Puls, PT DPT 04/21/2018, 12:03 PM  [place] MAIN [facility] SERVICES [address] [place], [place], [address] Phone: [phone]   Fax:  [phone]  Name: [name] MRN: [id number] Date of Birth: 1955-11-10

## 2018-04-23 ENCOUNTER — Ambulatory Visit: Payer: Medicare Other | Admitting: Occupational Therapy

## 2018-04-23 ENCOUNTER — Ambulatory Visit: Payer: Medicare Other

## 2018-04-23 ENCOUNTER — Encounter: Payer: Self-pay | Admitting: Occupational Therapy

## 2018-04-23 DIAGNOSIS — M6281 Muscle weakness (generalized): Secondary | ICD-10-CM

## 2018-04-23 DIAGNOSIS — R262 Difficulty in walking, not elsewhere classified: Secondary | ICD-10-CM

## 2018-04-23 DIAGNOSIS — R278 Other lack of coordination: Secondary | ICD-10-CM

## 2018-04-23 NOTE — Therapy (Addendum)
[facility] MAIN [facility] SERVICES [address] [place], [place], [address] Phone: [phone]   Fax:  [phone]  Occupational Therapy Treatment  Patient Details  Name: [name] MRN: [id number] Date of Birth: 10-17-1955 No data recorded  Encounter Date: 04/23/2018  OT End of Session - 04/23/18 1200    Visit Number  42    Number of Visits  48    Date for OT Re-Evaluation  07/02/17    Authorization Type  Visit 4 of 10 for progress report staring 04/14/2018    OT Start Time  1100    OT Stop Time  1145    OT Time Calculation (min)  45 min    Activity Tolerance  Patient tolerated treatment well    Behavior During Therapy  [facility] for tasks assessed/performed       Past Medical History:  Diagnosis Date  . Headache   . Hyperlipidemia   . MS (multiple sclerosis) (HCC) 1990  . Neurogenic bladder     Past Surgical History:  Procedure Laterality Date  . ABDOMINAL HYSTERECTOMY    . OOPHORECTOMY Bilateral     There were no vitals filed for this visit.  Subjective Assessment - 04/23/18 1110    Subjective   Pt reports that she is having a good day and is looking forward to a quiet night at home.    Pertinent History  Patient reports she was diagnosed with MS years ago and has recently started to feel weak in her left dominant arm and hand.  She denies any previous therapy and reports memory issues and can provide a limited history.     Patient Stated Goals  Patient reports she wants to be able to walk better, use her left hand and be able to write again.     Currently in Pain?  Yes    Pain Score  4     Pain Location  Other (Comment)   Full left-sided pain/weakness   Pain Orientation  Left    Pain Descriptors / Indicators  Aching;Sore    Pain Type  Chronic pain      OT TREATMENT   Neuromuscular re-education:  Pt. worked on grasping and positioning magnetic hooks on a whiteboard positioned at a vertical angle on an elevated surface. Pt.  worked on [facility] skills grasping 1/2" flat washers and placing them on the hooks. Pt demonstrated decreased FMC when reaching higher on the board. Pt required frequent rest breaks as she had difficulties keeping arms sustained in elevation. Pt required increased verbal cuing to grasp one washer at a time and to only use her left hand.  In order to improve [facility] skills and work on isolating her 2nd digit and 2-point pinch strength pt. completed paper tasks including folding and separating thin pieces of paper. Pt required increased verbal cuing to secure creases in paper and had difficulties folding paper with precision.  Self-care/IADLs:   Pt. worked on Careers information officer and speed. Pt. was able to write x12 different words based on prompts provided by therapist. Pt. used a mature hand grasp with a permanent marker. Pt used unlined paper and demonstrated no deviations from the line nor margin. Pt. was able to hold the pen for the entire exercise. Pt demonstrated decreased legibility and required increased verbal cuing when writing words that were less familiar than her name today.  OT Education - 04/23/18 1159    Education provided  Yes    Education Details  handwriting, [facility] skills    Person(s) Educated  Patient    Methods  Explanation;Demonstration;Verbal cues    Comprehension  Verbalized understanding;Verbal cues required;Returned demonstration          OT Trunnell Term Goals - 04/09/18 1458      OT Laconte TERM GOAL #1   Title  Patient will demonstrate holding utensils in her left hand and feeding herself with modified independence with minimal spillage.      Baseline  04/09/2018: Pt spills >50% of food when using L hand. Pt is able to cut soft meats while using her fork.    Time  8    Period  Weeks    Status  On-going    Target Date  06/04/18      OT Drennen TERM GOAL #2   Title  Patient will complete tub/shower transfer with supervision only.    Baseline   04/09/2018 Pt requires min A to lift legs over tub for transfers    Time  8    Period  Weeks    Status  On-going    Target Date  06/04/18      OT Matson TERM GOAL #4   Title  Patient will increase left UE strength by 1 mm grade to be able to reach into closet to retrieve clothing items.     Baseline  04/09/2018 - Pt LUE strength 4-/5. Pt can reach items in her closet independently    Time  12    Period  Weeks    Status  On-going    Target Date  07/02/18      OT Deller TERM GOAL #5   Title  Patient will improve coordination on the left to be able to hold pen securely and write her name with 75% legibility.     Baseline  04/09/2018 - Pt prints name with ~60% legibility using standard pen with a grip    Time  12    Period  Weeks    Status  On-going    Target Date  07/02/18      OT Kampf TERM GOAL #6   Title  Patient will complete lower body dressing with modified independence using adaptive equipment if needed.     Baseline  04/09/2018 - Pt requires min A to don pants. Pt requires increased time to tie shoes independently.    Time  12    Period  Weeks    Status  On-going    Target Date  07/02/18            Plan - 04/23/18 1200    Clinical Impression Statement Pt continues to work on [facility] skills and handwriting legibility. Pt completed FMC tasks while keeping arms sustained in elevation above head. Pt demonstrated decreased muscle endurance during this task as she required frequent rest breaks. Increased strengthening and [facility] skills with arms elevated is required for self-care tasks such as reaching for items in the closet, grooming hair, and reaching items in the kitchen cabinetry.     Occupational Profile and client history currently impacting functional performance  patient is legally blind, progressive disease process with MS for over 20+ years, lives alone, limited availability for transportation.      Occupational performance deficits (Please refer to evaluation for details):   ADL's;Leisure    Rehab Potential  Good    OT Frequency  2x / week  OT Duration  12 weeks    OT Treatment/Interventions  Therapeutic exercise;Cognitive remediation/compensation;Functional Mobility Training    Plan  Positive:  motivation, family support.  Negative:  lives alone, legally blind, requires caregiver 7 days a week for a few hours a day for self care    Clinical Decision Making  Several treatment options, min-mod task modification necessary    Consulted and Agree with Plan of Care  Patient       Patient will benefit from skilled therapeutic intervention in order to improve the following deficits and impairments:  Decreased cognition, Decreased knowledge of use of DME, Impaired vision/preception, Pain, Decreased coordination, Decreased activity tolerance, Decreased endurance, Decreased strength, Decreased balance, Impaired UE functional use  Visit Diagnosis: Other lack of coordination  Muscle weakness (generalized)    Problem List Patient Active Problem List   Diagnosis Date Noted  . Lower extremity edema 04/15/2017  . Macrocytic anemia 02/11/2017  . Vitamin D deficiency 02/02/2016  . Mixed hyperlipidemia 01/03/2015  . MS (multiple sclerosis) (HCC) 01/03/2015  . Chronic pain 01/03/2015  . GERD without esophagitis 01/03/2015  . COPD (chronic obstructive pulmonary disease) (HCC) 01/03/2015  . Osteopenia 01/03/2015  . Neurogenic bladder 01/03/2015  . Incontinence in female 01/03/2015    Ernesto Rutherford, OTS 04/23/2018, 12:04 PM   This entire session was performed under direct supervision and direction of a licensed therapist/therapist assistant . I have personally read, edited and approve of the note as written.  Olegario Messier, MS, OTR/L   [facility] MAIN [facility] SERVICES [address] [place], [place], [address] Phone: [phone]   Fax:  [phone]  Name: KELITA WALLIS MRN: [id number] Date of Birth: 1956/03/21

## 2018-04-23 NOTE — Therapy (Signed)
[place] MAIN [facility] SERVICES [address] [place], [place], [address] Phone: [phone]   Fax:  [phone]  Physical Therapy Treatment  Patient Details  Name: [name] MRN: [id number] Date of Birth: 1956/06/08 Referring Provider (PT): Anabel Bene   Encounter Date: 04/23/2018  PT End of Session - 04/23/18 1024    Visit Number  26    Number of Visits  41    Date for PT Re-Evaluation  05/27/18    Authorization Type  6/10    PT Start Time  1020    PT Stop Time  1100    PT Time Calculation (min)  40 min    Equipment Utilized During Treatment  Gait belt    Activity Tolerance  Patient tolerated treatment well    Behavior During Therapy  WFL for tasks assessed/performed       Past Medical History:  Diagnosis Date  . Headache   . Hyperlipidemia   . MS (multiple sclerosis) ([place]) 1990  . Neurogenic bladder     Past Surgical History:  Procedure Laterality Date  . ABDOMINAL HYSTERECTOMY    . OOPHORECTOMY Bilateral     There were no vitals filed for this visit.  Subjective Assessment - 04/23/18 1017    Subjective  Pt reports that she is doing well today. Denies pain currently. No specific questions or concerns at this time.    Pertinent History  Patient reports about 3 months ago she started to feel weak in the left arm, reports she is legally blind.       How [place] can you stand comfortably?  not too [place]     Patient Stated Goals  Patient wants to be able to walk better.     Currently in Pain?  No/denies    Pain Onset  --        TREATMENT    Neuromuscular Re-education NuStep L3 x 5 minutes for warm-up during history 2 minutes unbilled; Alternatingtoe taps to 6inch step with no UE assist x 10reps bilaterally; Step-ups to 6" step alternating LE x 10 each without UE support; Heel/toe rockingx 10reps without UE assist; Lateral toe taps to 6" step x 10 bilateral; Mini squatwithout UE support x  10reps; Semitandem stance alternating forward LE 2 x 30s each without UE support; Hurdle steps alternating LE x 10 each with single UE support, intermittent min to modA+1 Airex feet together balance eyes open 30s x 2;   Pt educated throughout session about proper posture and technique with exercises. Improved exercise technique, movement at target joints, use of target muscles after min to mod verbal, visual, tactile cues.    Pt continue to present with unsteadiness on uneven surfaces and fatigues with therapeutic exercises. Patient tolerated all interventions well this date with good motivation throughout session. She requires intermittent seated rest breaks due to fatigue. Pt will benefit from continued skilled PT interventions to improve strength and balance and decrease risk of falling                    PT Short Term Goals - 03/16/18 1521      PT SHORT TERM GOAL #1   Title  Patient will be independent in home exercise program to improve strength/mobility for better functional independence with ADLs.    Time  6    Period  Weeks    Status  On-going      PT SHORT TERM GOAL #2   Title  nt (< 35 years old) will complete five times sit to stand test in < 10 seconds indicating an increased LE strength and improved balance.    Baseline  02/11/18= 19.38; 03/16/18: 18.0s    Time  6    Period  Weeks    Status  On-going        PT [place] Term Goals - 04/01/18 1356      PT [place] TERM GOAL #1   Title  Patient will increase six minute walk test distance to >1000 for progression to community ambulator and improve gait ability    Baseline  02/11/18= 500 feet; 03/16/18: 830 feet,  04/01/18= 515    Time  12    Period  Weeks    Status  Partially Met    Target Date  05/27/18      PT Colvin TERM GOAL #2   Title  Patient will increase 10 meter walk test to >1.39ms as to improve gait speed for better community ambulation and to reduce fall risk.    Baseline  02/11/18 . 68 m/sec;  03/16/18: self-selected: 12.6s= 0.79 m/s fastest: 10.4s = 0.96 m/s, 04/01/18= .1.0 m/sec    Time  12    Period  Weeks    Status  Partially Met    Target Date  05/27/18      PT Tesar TERM GOAL #3   Title  Patient will reduce timed up and go to <11 seconds to reduce fall risk and demonstrate improved transfer/gait ability.    Baseline  17.49 sec 02/11/18; 03/16/18: 16.8s, 04/01/18= 15.9 sec    Time  12    Period  Weeks    Status  Partially Met    Target Date  05/27/18      PT [place]ston TERM GOAL #4   Title  Patient (> 659years old) will complete five times sit to stand test in < 15 seconds indicating an increased LE strength and improved balance.    Baseline  19.38sec 02/11/18; 03/16/18: 18.0s, 04/01/18= 17.04 sec    Time  12    Period  Weeks    Status  Partially Met    Target Date  05/27/18            Plan - 04/23/18 1024    Clinical Impression Statement  Pt continue to present with unsteadiness on uneven surfaces and fatigues with therapeutic exercises. Patient tolerated all interventions well this date with good motivation throughout session. She requires intermittent seated rest breaks due to fatigue. Pt will benefit from continued skilled PT interventions to improve strength and balance and decrease risk of falling     PT Frequency  2x / week    PT Duration  8 weeks    PT Treatment/Interventions  Gait training;Therapeutic exercise;Therapeutic activities;Functional mobility training;Neuromuscular re-education;Patient/family education;Balance training    PT Next Visit Plan  strengthening, balance training    Consulted and Agree with Plan of Care  Patient       Patient will benefit from skilled therapeutic intervention in order to improve the following deficits and impairments:  Abnormal gait, Decreased balance, Decreased endurance, Postural dysfunction, Impaired flexibility, Decreased strength, Decreased safety awareness, Decreased activity tolerance, Decreased range of motion, Decreased  knowledge of precautions, Impaired sensation, Difficulty walking  Visit Diagnosis: Difficulty in walking, not elsewhere classified  Other lack of coordination  Muscle weakness (generalized)     Problem List Patient Active Problem List   Diagnosis Date Noted  . Lower extremity edema 04/15/2017  .  Macrocytic anemia 02/11/2017  . Vitamin D deficiency 02/02/2016  . Mixed hyperlipidemia 01/03/2015  . MS (multiple sclerosis) ([place]) 01/03/2015  . Chronic pain 01/03/2015  . GERD without esophagitis 01/03/2015  . COPD (chronic obstructive pulmonary disease) ([place]) 01/03/2015  . Osteopenia 01/03/2015  . Neurogenic bladder 01/03/2015  . Incontinence in female 01/03/2015   Phillips Grout PT, DPT, GCS  Vieva Brummitt 04/23/2018, 4:23 PM  [place] MAIN [facility] SERVICES [address] [place], [place], [address] Phone: [phone]   Fax:  [phone]  Name: ALESSANDRIA HENKEN MRN: [id number] Date of Birth: [birth year]

## 2018-04-28 ENCOUNTER — Ambulatory Visit: Payer: Medicare Other | Admitting: Physical Therapy

## 2018-04-28 ENCOUNTER — Ambulatory Visit: Payer: Medicare Other | Admitting: Occupational Therapy

## 2018-04-30 ENCOUNTER — Ambulatory Visit: Payer: Medicare Other | Attending: Neurology | Admitting: Physical Therapy

## 2018-04-30 ENCOUNTER — Encounter: Payer: Self-pay | Admitting: Physical Therapy

## 2018-04-30 DIAGNOSIS — M6281 Muscle weakness (generalized): Secondary | ICD-10-CM | POA: Diagnosis not present

## 2018-04-30 DIAGNOSIS — R262 Difficulty in walking, not elsewhere classified: Secondary | ICD-10-CM

## 2018-04-30 DIAGNOSIS — R278 Other lack of coordination: Secondary | ICD-10-CM | POA: Insufficient documentation

## 2018-05-04 ENCOUNTER — Ambulatory Visit: Payer: Medicare Other | Admitting: Physical Therapy

## 2018-05-04 NOTE — Therapy (Signed)
[place] MAIN [facility] SERVICES [address] [place], [place], [address] Phone: [phone]   Fax:  [phone]  Physical Therapy Treatment  Patient Details  Name: [name] MRN: [id number] Date of Birth: 1956/01/20 Referring Provider (PT): Anabel Bene   Encounter Date: 04/30/2018    Past Medical History:  Diagnosis Date  . Headache   . Hyperlipidemia   . MS (multiple sclerosis) ([place]) 1990  . Neurogenic bladder     Past Surgical History:  Procedure Laterality Date  . ABDOMINAL HYSTERECTOMY    . OOPHORECTOMY Bilateral     There were no vitals filed for this visit.     Subjective: Patient states she is feeling well today. She states that she is feeling a bit better with her balance during small distance walking (household). She denies any falls or significant changes since the last visit  Pain Assessment:  Currently in pain? Yes Pain score: 3/10 Pain location: whole left side of body Pain orientation: Left Pain descriptors: aching: sore Pain type: chronic pain  Patient Education: Patient educated on balance strategies, safety, and technique throughout the session via explanation, demonstration, verbal cues, and tactile cues. Patient verbalized understanding, returned demonstration, needs further instruction, and requires verbal cues.  TREATMENT  Neuromuscular Re-education: Alternatingtoe taps to 6inch step with faded UE assist x 15reps bilaterally; Step-ups to 6" step alternating LE x 10 each with faded UE support; Mini squatwith faded UE support 2 x 10reps; Semitandem stance alternating forward LE 2 x 30s each without UE support; Hurdle steps alternating LE x 15 each with single UE support, intermittent min to modA+1 Airex feet together balance eyes open 30s x 4; Walking without assistive device (~20 feet x 3 bouts), w/ UE functional reaching tasks, CGA, mod VCs to promote safety awareness and optimal body  position   Treatments unbilled: Nu-step, L3 x 5 minutes for warm-up during history 3 minutes unbilled    PT Short Term Goals - 03/16/18 1521      PT SHORT TERM GOAL #1   Title  Patient will be independent in home exercise program to improve strength/mobility for better functional independence with ADLs.    Time  6    Period  Weeks    Status  On-going      PT SHORT TERM GOAL #2   Title  nt (< 14 years old) will complete five times sit to stand test in < 10 seconds indicating an increased LE strength and improved balance.    Baseline  02/11/18= 19.38; 03/16/18: 18.0s    Time  6    Period  Weeks    Status  On-going        PT [place] Term Goals - 04/01/18 1356      PT Bragdon TERM GOAL #1   Title  Patient will increase six minute walk test distance to >1000 for progression to community ambulator and improve gait ability    Baseline  02/11/18= 500 feet; 03/16/18: 830 feet,  04/01/18= 515    Time  12    Period  Weeks    Status  Partially Met    Target Date  05/27/18      PT Royle TERM GOAL #2   Title  Patient will increase 10 meter walk test to >1.80ms as to improve gait speed for better community ambulation and to reduce fall risk.    Baseline  02/11/18 . 68 m/sec; 03/16/18: self-selected: 12.6s= 0.79 m/s fastest: 10.4s = 0.96 m/s,  04/01/18= .1.0 m/sec    Time  12    Period  Weeks    Status  Partially Met    Target Date  05/27/18      PT Zhan TERM GOAL #3   Title  Patient will reduce timed up and go to <11 seconds to reduce fall risk and demonstrate improved transfer/gait ability.    Baseline  17.49 sec 02/11/18; 03/16/18: 16.8s, 04/01/18= 15.9 sec    Time  12    Period  Weeks    Status  Partially Met    Target Date  05/27/18      PT Crawshaw TERM GOAL #4   Title  Patient (> 38 years old) will complete five times sit to stand test in < 15 seconds indicating an increased LE strength and improved balance.    Baseline  19.38sec 02/11/18; 03/16/18: 18.0s, 04/01/18= 17.04 sec    Time  12     Period  Weeks    Status  Partially Met    Target Date  05/27/18       PLAN Clinical Impression: Patient presents to clinic with decreased balance, decreased safety awareness, increased pain, and deficits in strength. Deficits are most apparent during dual task activities with patient self-selecting a hip strategy to maintain balance during functional tasks with limited awareness for limits of stability when reaching with UE. Patient was able to stand for >75% of the session, requiring only 2 seated rest breaks of < 2 min each indicating an increasing tolerance to activity. Patient responded well to verbal cues and visual demonstration during explanation of weight shift and controlling center of mass. Patient will benefit from continued skilled therapeutic intervention to address deficits in strength, balance, endurance, mobility, and safety awareness in order to decrease risk of falls and improve overall QOL.        Patient will benefit from skilled therapeutic intervention in order to improve the following deficits and impairments:  Abnormal gait, Decreased balance, Decreased endurance, Postural dysfunction, Impaired flexibility, Decreased strength, Decreased safety awareness, Decreased activity tolerance, Decreased range of motion, Decreased knowledge of precautions, Impaired sensation, Difficulty walking  Visit Diagnosis: Other lack of coordination  Muscle weakness (generalized)  Difficulty in walking, not elsewhere classified     Problem List Patient Active Problem List   Diagnosis Date Noted  . Lower extremity edema 04/15/2017  . Macrocytic anemia 02/11/2017  . Vitamin D deficiency 02/02/2016  . Mixed hyperlipidemia 01/03/2015  . MS (multiple sclerosis) ([place]) 01/03/2015  . Chronic pain 01/03/2015  . GERD without esophagitis 01/03/2015  . COPD (chronic obstructive pulmonary disease) ([place]) 01/03/2015  . Osteopenia 01/03/2015  . Neurogenic bladder 01/03/2015  . Incontinence  in female 01/03/2015    Myles Gip PT, DPT [phone] 05/04/2018, 7:40 AM  [place] MAIN [facility] SERVICES [address] [place], [place], [address] Phone: [phone]   Fax:  [phone]  Name: [name] MRN: [id number] Date of Birth: May 29, 1956

## 2018-05-05 ENCOUNTER — Ambulatory Visit: Payer: Medicare Other

## 2018-05-05 ENCOUNTER — Encounter: Payer: Medicare Other | Admitting: Occupational Therapy

## 2018-05-06 ENCOUNTER — Ambulatory Visit: Payer: Medicare Other | Admitting: Physical Therapy

## 2018-05-06 ENCOUNTER — Encounter: Payer: Self-pay | Admitting: Physical Therapy

## 2018-05-06 ENCOUNTER — Ambulatory Visit: Payer: Medicare Other | Admitting: Occupational Therapy

## 2018-05-06 ENCOUNTER — Encounter: Payer: Self-pay | Admitting: Occupational Therapy

## 2018-05-06 DIAGNOSIS — M6281 Muscle weakness (generalized): Secondary | ICD-10-CM

## 2018-05-06 DIAGNOSIS — R278 Other lack of coordination: Secondary | ICD-10-CM

## 2018-05-06 DIAGNOSIS — R262 Difficulty in walking, not elsewhere classified: Secondary | ICD-10-CM

## 2018-05-06 NOTE — Therapy (Addendum)
[place] [facility] MAIN [facility] SERVICES [address] [place], [place], [address] Phone: [phone]   Fax:  [phone]  Occupational Therapy Treatment  Patient Details  Name: [name] MRN: [id number] Date of Birth: [birth year] No data recorded  Encounter Date: 05/06/2018  OT End of Session - 05/06/18 1437    Visit Number  43    Number of Visits  48    Date for OT Re-Evaluation  07/02/17    Authorization Type  Visit 5 of 10 for progress report staring 04/14/2018    OT Start Time  1345    OT Stop Time  1430    OT Time Calculation (min)  45 min    Activity Tolerance  Patient tolerated treatment well    Behavior During Therapy  [facility] for tasks assessed/performed       Past Medical History:  Diagnosis Date  . Headache   . Hyperlipidemia   . MS (multiple sclerosis) ([facility]) 1990  . Neurogenic bladder     Past Surgical History:  Procedure Laterality Date  . ABDOMINAL HYSTERECTOMY    . OOPHORECTOMY Bilateral     There were no vitals filed for this visit.  Subjective Assessment - 05/06/18 1429    Subjective   Pt reports that she is having a good day and that she has been practicing signing her name.    Pertinent History  Patient reports she was diagnosed with MS years ago and has recently started to feel weak in her left dominant arm and hand.  She denies any previous therapy and reports memory issues and can provide a limited history.     Patient Stated Goals  Patient reports she wants to be able to walk better, use her left hand and be able to write again.     Currently in Pain?  Yes    Pain Score  4     Pain Location  Leg    Pain Orientation  Left    Pain Descriptors / Indicators  Aching;Sore    Pain Type  Chronic pain    Multiple Pain Sites  No      OT TREATMENT  Neuromuscular re-education:   Pt. worked on left hand [facility] skills including grasping 1/2" flat washers, and placing them on magnetic hooks placed on a Manly Nestle board at a  vertical angle on an elevated surface. Pt demonstrated increased tremors when reaching above head. Pt required frequent rest breaks and noted increased UE fatigue during task. Whiteboard was repositioned from elevated surface and placed on the table top. Pt demonstrated increased [facility] skill once repositioned. Pt dropped x5 washers during the task.                   OT Education - 05/06/18 1437    Education provided  Yes    Education Details  Left hand [facility] skills and reaching    Person(s) Educated  Patient    Methods  Explanation;Demonstration;Verbal cues    Comprehension  Verbalized understanding;Verbal cues required;Returned demonstration          OT [place] Term Goals - 04/09/18 1458      OT [place] TERM GOAL #1   Title  Patient will demonstrate holding utensils in her left hand and feeding herself with modified independence with minimal spillage.      Baseline  04/09/2018: Pt spills >50% of food when using L hand. Pt is able to cut soft meats while using her fork.  Time  8    Period  Weeks    Status  On-going    Target Date  06/04/18      OT [place] TERM GOAL #2   Title  Patient will complete tub/shower transfer with supervision only.    Baseline  04/09/2018 Pt requires min A to lift legs over tub for transfers    Time  8    Period  Weeks    Status  On-going    Target Date  06/04/18      OT [place] TERM GOAL #4   Title  Patient will increase left UE strength by 1 mm grade to be able to reach into closet to retrieve clothing items.     Baseline  04/09/2018 - Pt LUE strength 4-/5. Pt can reach items in her closet independently    Time  12    Period  Weeks    Status  On-going    Target Date  07/02/18      OT [place] TERM GOAL #5   Title  Patient will improve coordination on the left to be able to hold pen securely and write her name with 75% legibility.     Baseline  04/09/2018 - Pt prints name with ~60% legibility using standard pen with a grip    Time  12    Period   Weeks    Status  On-going    Target Date  07/02/18      OT [place] TERM GOAL #6   Title  Patient will complete lower body dressing with modified independence using adaptive equipment if needed.     Baseline  04/09/2018 - Pt requires min A to don pants. Pt requires increased time to tie shoes independently.    Time  12    Period  Weeks    Status  On-going    Target Date  07/02/18            Plan - 05/06/18 1438    Clinical Impression Statement  Pt continues to work to improve left hand [facility] skills. Pt presents today with increased tremors and increased difficulties with [facility] tasks as a result of not having treatment for the last two weeks. Pt was not able to complete task that required her to keep her arms sustained in elevation above head due to decreased strength. Pt to continue to increase strength and [facility] skills to promote independence during ADLs and IADLs.    Occupational Profile and client history currently impacting functional performance  patient is legally blind, progressive disease process with MS for over 20+ years, lives alone, limited availability for transportation.      Occupational performance deficits (Please refer to evaluation for details):  ADL's;Leisure    Rehab Potential  Good    OT Frequency  2x / week    OT Duration  12 weeks    OT Treatment/Interventions  Therapeutic exercise;Cognitive remediation/compensation;Functional Mobility Training    Plan  Positive:  motivation, family support.  Negative:  lives alone, legally blind, requires caregiver 7 days a week for a few hours a day for self care    Clinical Decision Making  Several treatment options, min-mod task modification necessary    Consulted and Agree with Plan of Care  Patient       Patient will benefit from skilled therapeutic intervention in order to improve the following deficits and impairments:  Decreased cognition, Decreased knowledge of use of DME, Impaired vision/preception, Pain, Decreased  coordination, Decreased activity tolerance, Decreased  endurance, Decreased strength, Decreased balance, Impaired UE functional use  Visit Diagnosis: Muscle weakness (generalized)    Problem List Patient Active Problem List   Diagnosis Date Noted  . Lower extremity edema 04/15/2017  . Macrocytic anemia 02/11/2017  . Vitamin D deficiency 02/02/2016  . Mixed hyperlipidemia 01/03/2015  . MS (multiple sclerosis) (HCC) 01/03/2015  . Chronic pain 01/03/2015  . GERD without esophagitis 01/03/2015  . COPD (chronic obstructive pulmonary disease) (HCC) 01/03/2015  . Osteopenia 01/03/2015  . Neurogenic bladder 01/03/2015  . Incontinence in female 01/03/2015    Ernesto Rutherford, OTS 05/06/2018, 4:34 PM   This entire session was performed under direct supervision and direction of a licensed therapist/therapist assistant . I have personally read, edited and approve of the note as written.  Olegario Messier, MS, OTR/L   [place] [facility] MAIN [facility] SERVICES [address] [place], [place], [address] Phone: [phone]   Fax:  [phone]  Name: [name] MRN: [id number] Date of Birth: [birth year]

## 2018-05-06 NOTE — Therapy (Signed)
[place] MAIN [facility] SERVICES [address] [place], [place], [address] Phone: [phone]   Fax:  [phone]  Physical Therapy Treatment  Patient Details  Name: [name] MRN: [id number] Date of Birth: 29-Dec-1955 Referring Provider (PT): Anabel Bene   Encounter Date: 05/06/2018  PT End of Session - 05/06/18 1311    Visit Number  28    Number of Visits  41    Date for PT Re-Evaluation  05/27/18    Authorization Type  8/10    PT Start Time  0100    PT Stop Time  0138    PT Time Calculation (min)  38 min    Equipment Utilized During Treatment  Gait belt    Activity Tolerance  Patient tolerated treatment well    Behavior During Therapy  WFL for tasks assessed/performed       Past Medical History:  Diagnosis Date  . Headache   . Hyperlipidemia   . MS (multiple sclerosis) ([place]) 1990  . Neurogenic bladder     Past Surgical History:  Procedure Laterality Date  . ABDOMINAL HYSTERECTOMY    . OOPHORECTOMY Bilateral     There were no vitals filed for this visit.  Subjective Assessment - 05/06/18 1310    Subjective  Patient states she is feeling well today. She denies any falls or significant changes since the last visit.    Pertinent History  Patient reports about 3 months ago she started to feel weak in the left arm, reports she is legally blind.       How Beringer can you stand comfortably?  not too [place]     Patient Stated Goals  Patient wants to be able to walk better.     Currently in Pain?  No/denies    Pain Score  0-No pain    Pain Onset  More than a month ago       Treatment:  side stepping in hall 300 feet left and right and bwd walking and min assist, no AD  Gait training on TM . 7 miles / hour x 3 mins fwd, side stepping . 4 miles / hour  Standing in parallel bars: Standing on airex: toe taps to 4 inch step with 2-1 rail assist x15 reps bilaterally; standing one foot on step, AlternateBUE ball pass  side/sidex5 reps each foot on step; mini squat unsupported x10 reps; heel/toe raises x15 reps with B rail assist for balance; Alternate march x15 bilaterally with cues to increase hip flexion for better ROM/strength; staggered stance, head turns side/side, up/down x5 reps each, each foot in front;  Patient tolerated session well; denies any pain or fatigue at end of session                        PT Education - 05/06/18 1311    Education Details  Patient educated on balance strategies, safety, and technique throughout the session    Person(s) Educated  Patient    Methods  Explanation;Demonstration;Tactile cues;Verbal cues    Comprehension  Verbalized understanding;Need further instruction       PT Short Term Goals - 03/16/18 1521      PT SHORT TERM GOAL #1   Title  Patient will be independent in home exercise program to improve strength/mobility for better functional independence with ADLs.    Time  6    Period  Weeks    Status  On-going  PT SHORT TERM GOAL #2   Title  nt (< 51 years old) will complete five times sit to stand test in < 10 seconds indicating an increased LE strength and improved balance.    Baseline  02/11/18= 19.38; 03/16/18: 18.0s    Time  6    Period  Weeks    Status  On-going        PT Tool Term Goals - 04/01/18 1356      PT Khim TERM GOAL #1   Title  Patient will increase six minute walk test distance to >1000 for progression to community ambulator and improve gait ability    Baseline  02/11/18= 500 feet; 03/16/18: 830 feet,  04/01/18= 515    Time  12    Period  Weeks    Status  Partially Met    Target Date  05/27/18      PT Vogl TERM GOAL #2   Title  Patient will increase 10 meter walk test to >1.64ms as to improve gait speed for better community ambulation and to reduce fall risk.    Baseline  02/11/18 . 68 m/sec; 03/16/18: self-selected: 12.6s= 0.79 m/s fastest: 10.4s = 0.96 m/s, 04/01/18= .1.0 m/sec    Time  12    Period   Weeks    Status  Partially Met    Target Date  05/27/18      PT Battaglini TERM GOAL #3   Title  Patient will reduce timed up and go to <11 seconds to reduce fall risk and demonstrate improved transfer/gait ability.    Baseline  17.49 sec 02/11/18; 03/16/18: 16.8s, 04/01/18= 15.9 sec    Time  12    Period  Weeks    Status  Partially Met    Target Date  05/27/18      PT Morimoto TERM GOAL #4   Title  Patient (> 669years old) will complete five times sit to stand test in < 15 seconds indicating an increased LE strength and improved balance.    Baseline  19.38sec 02/11/18; 03/16/18: 18.0s, 04/01/18= 17.04 sec    Time  12    Period  Weeks    Status  Partially Met    Target Date  05/27/18            Plan - 05/06/18 1313    Clinical Impression Statement  Patient   instructed in gait training on level surfaces on TM with rest periods standing. Patient is able to perform intermediate LE exercises in supine and standing open and closed chain.  Patient performs dynamic standing balance training with uneven surfaces with CGA. Patient will continue to benefit from skilled PT to improve strength, balance and gait. Patient continues to have LE weakness   and decreased static and dynamic standing balance.    PT Frequency  2x / week    PT Duration  8 weeks    PT Treatment/Interventions  Gait training;Therapeutic exercise;Therapeutic activities;Functional mobility training;Neuromuscular re-education;Patient/family education;Balance training    PT Next Visit Plan  strengthening, balance training    Consulted and Agree with Plan of Care  Patient       Patient will benefit from skilled therapeutic intervention in order to improve the following deficits and impairments:  Abnormal gait, Decreased balance, Decreased endurance, Postural dysfunction, Impaired flexibility, Decreased strength, Decreased safety awareness, Decreased activity tolerance, Decreased range of motion, Decreased knowledge of precautions, Impaired  sensation, Difficulty walking  Visit Diagnosis: Other lack of coordination  Muscle weakness (generalized)  Difficulty  in walking, not elsewhere classified     Problem List Patient Active Problem List   Diagnosis Date Noted  . Lower extremity edema 04/15/2017  . Macrocytic anemia 02/11/2017  . Vitamin D deficiency 02/02/2016  . Mixed hyperlipidemia 01/03/2015  . MS (multiple sclerosis) ([place]) 01/03/2015  . Chronic pain 01/03/2015  . GERD without esophagitis 01/03/2015  . COPD (chronic obstructive pulmonary disease) ([place]) 01/03/2015  . Osteopenia 01/03/2015  . Neurogenic bladder 01/03/2015  . Incontinence in female 01/03/2015    Alanson Puls, PT  DPT 05/06/2018, 1:15 PM  [place] MAIN [facility] SERVICES [address] [place], [place], [address] Phone: [phone]   Fax:  [phone]  Name: [name] MRN: [id number] Date of Birth: 21-Oct-1955

## 2018-05-11 ENCOUNTER — Ambulatory Visit: Payer: Medicare Other | Admitting: Occupational Therapy

## 2018-05-11 ENCOUNTER — Encounter: Payer: Self-pay | Admitting: Physical Therapy

## 2018-05-11 ENCOUNTER — Encounter: Payer: Self-pay | Admitting: Occupational Therapy

## 2018-05-11 ENCOUNTER — Ambulatory Visit: Payer: Medicare Other | Admitting: Physical Therapy

## 2018-05-11 DIAGNOSIS — R262 Difficulty in walking, not elsewhere classified: Secondary | ICD-10-CM | POA: Diagnosis not present

## 2018-05-11 DIAGNOSIS — M6281 Muscle weakness (generalized): Secondary | ICD-10-CM

## 2018-05-11 DIAGNOSIS — R278 Other lack of coordination: Secondary | ICD-10-CM

## 2018-05-11 NOTE — Therapy (Signed)
[place] MAIN [facility] SERVICES [address] [place], [place], [address] Phone: [phone]   Fax:  [phone]  Physical Therapy Treatment  Patient Details  Name: [name] MRN: [id number] Date of Birth: 04/18/1956 Referring Provider (PT): Anabel Bene   Encounter Date: 05/11/2018  PT End of Session - 05/11/18 1108    Visit Number  29    Number of Visits  41    Date for PT Re-Evaluation  05/27/18    Authorization Type  9/10    PT Start Time  1100    PT Stop Time  1145    PT Time Calculation (min)  45 min    Equipment Utilized During Treatment  Gait belt    Activity Tolerance  Patient tolerated treatment well    Behavior During Therapy  WFL for tasks assessed/performed       Past Medical History:  Diagnosis Date  . Headache   . Hyperlipidemia   . MS (multiple sclerosis) ([place]) 1990  . Neurogenic bladder     Past Surgical History:  Procedure Laterality Date  . ABDOMINAL HYSTERECTOMY    . OOPHORECTOMY Bilateral     There were no vitals filed for this visit.  Subjective Assessment - 05/11/18 1108    Subjective  Patient states she is feeling well today. She denies any falls or significant changes since the last visit.    Pertinent History  Patient reports about 3 months ago she started to feel weak in the left arm, reports she is legally blind.       How Carl[place] can you stand comfortably?  not too [place]     Patient Stated Goals  Patient wants to be able to walk better.     Currently in Pain?  No/denies    Pain Score  0-No pain    Pain Onset  More than a month ago    Multiple Pain Sites  No       Ther-ex  Standing hip abd with 2# ankle weight x 15 BLE and  BUE support  Standing hip extension with 2# ankle weight x 10 BLE with BUE support  Standing hip flexion marches with 2# ankle weight x 10 BLE with BUE support  Standing HS curls with 2# ankle weight x 10 BLE with BUE support  Standing heel raises x 15 with 2# ankle  weights  LAQ with 2# ankle weights with 3 second holds x10 each LE  STS from chair without UE support 2x10  Mini squats x10 with no UE support      Neuromuscular Re-education  Toe taps to 6" step without UE support alternating LE x15 each  Normal stance on Airex pad 2x60sec (requires minA for falls recovery 2 times)?  Forward and backward stepping over hurdle x15 each direction  SIde stepping over hurdle 15x each direction     CGA and Min verbal cues used throughout with increased in postural sway and LOB most seen with narrow base of support and while on uneven surfaces. Continues to have balance deficits typical with diagnosis. Patient performs intermediate level exercises without pain behaviors and needs verbal cuing for postural alignment and head positioning                      PT Education - 05/11/18 1108    Education Details  Patient educated on balance strategies, safety, and technique throughout the session    Person(s) Educated  Patient  Methods  Explanation;Demonstration;Tactile cues    Comprehension  Returned demonstration;Need further instruction       PT Short Term Goals - 03/16/18 1521      PT SHORT TERM GOAL #1   Title  Patient will be independent in home exercise program to improve strength/mobility for better functional independence with ADLs.    Time  6    Period  Weeks    Status  On-going      PT SHORT TERM GOAL #2   Title  nt (< 44 years old) will complete five times sit to stand test in < 10 seconds indicating an increased LE strength and improved balance.    Baseline  02/11/18= 19.38; 03/16/18: 18.0s    Time  6    Period  Weeks    Status  On-going        PT Kail Term Goals - 04/01/18 1356      PT Mcinerny TERM GOAL #1   Title  Patient will increase six minute walk test distance to >1000 for progression to community ambulator and improve gait ability    Baseline  02/11/18= 500 feet; 03/16/18: 830 feet,  04/01/18= 515    Time  12     Period  Weeks    Status  Partially Met    Target Date  05/27/18      PT Pilkington TERM GOAL #2   Title  Patient will increase 10 meter walk test to >1.71ms as to improve gait speed for better community ambulation and to reduce fall risk.    Baseline  02/11/18 . 68 m/sec; 03/16/18: self-selected: 12.6s= 0.79 m/s fastest: 10.4s = 0.96 m/s, 04/01/18= .1.0 m/sec    Time  12    Period  Weeks    Status  Partially Met    Target Date  05/27/18      PT Rossin TERM GOAL #3   Title  Patient will reduce timed up and go to <11 seconds to reduce fall risk and demonstrate improved transfer/gait ability.    Baseline  17.49 sec 02/11/18; 03/16/18: 16.8s, 04/01/18= 15.9 sec    Time  12    Period  Weeks    Status  Partially Met    Target Date  05/27/18      PT Fontenette TERM GOAL #4   Title  Patient (> 793years old) will complete five times sit to stand test in < 15 seconds indicating an increased LE strength and improved balance.    Baseline  19.38sec 02/11/18; 03/16/18: 18.0s, 04/01/18= 17.04 sec    Time  12    Period  Weeks    Status  Partially Met    Target Date  05/27/18            Plan - 05/11/18 1110    Clinical Impression Statement  Patient demonstrates improved stability and strength allowing patient to perform short duration standing interventions with rest periods.  Patient performs intermediate standing dynamic standing balance exercises to shift weight and perform single leg standing activities with mod assist. Patient fatigues quickly with exercises requiring rest breaks at this time. Patient will continue to benefit from skilled physical therapy to improve pain and mobility.    PT Frequency  2x / week    PT Duration  8 weeks    PT Treatment/Interventions  Gait training;Therapeutic exercise;Therapeutic activities;Functional mobility training;Neuromuscular re-education;Patient/family education;Balance training    PT Next Visit Plan  strengthening, balance training    Consulted and Agree with Plan of  Care  Patient       Patient will benefit from skilled therapeutic intervention in order to improve the following deficits and impairments:  Abnormal gait, Decreased balance, Decreased endurance, Postural dysfunction, Impaired flexibility, Decreased strength, Decreased safety awareness, Decreased activity tolerance, Decreased range of motion, Decreased knowledge of precautions, Impaired sensation, Difficulty walking  Visit Diagnosis: Muscle weakness (generalized)  Other lack of coordination  Difficulty in walking, not elsewhere classified     Problem List Patient Active Problem List   Diagnosis Date Noted  . Lower extremity edema 04/15/2017  . Macrocytic anemia 02/11/2017  . Vitamin D deficiency 02/02/2016  . Mixed hyperlipidemia 01/03/2015  . MS (multiple sclerosis) ([place]) 01/03/2015  . Chronic pain 01/03/2015  . GERD without esophagitis 01/03/2015  . COPD (chronic obstructive pulmonary disease) ([place]) 01/03/2015  . Osteopenia 01/03/2015  . Neurogenic bladder 01/03/2015  . Incontinence in female 01/03/2015    Alanson Puls, PT DPT 05/11/2018, 11:11 AM  [place] MAIN [facility] SERVICES [address] [place], [place], [address] Phone: [phone]   Fax:  [phone]  Name: [name] MRN: [id number] Date of Birth: [birth year]

## 2018-05-11 NOTE — Therapy (Addendum)
[place] [facility] MAIN [facility] SERVICES [address] [place], [place], [address] Phone: [phone]   Fax:  [phone]  Occupational Therapy Treatment  Patient Details  Name: [name] MRN: [id number] Date of Birth: 1956-03-10 No data recorded  Encounter Date: 05/11/2018  OT End of Session - 05/11/18 1153    Visit Number  44    Number of Visits  48    Date for OT Re-Evaluation  07/02/17    Authorization Type  Visit 6 of 10 for progress report staring 04/14/2018    OT Start Time  1145    OT Stop Time  1230    OT Time Calculation (min)  45 min    Activity Tolerance  Patient tolerated treatment well    Behavior During Therapy  [facility] for tasks assessed/performed       Past Medical History:  Diagnosis Date  . Headache   . Hyperlipidemia   . MS (multiple sclerosis) ([facility]) 1990  . Neurogenic bladder     Past Surgical History:  Procedure Laterality Date  . ABDOMINAL HYSTERECTOMY    . OOPHORECTOMY Bilateral     There were no vitals filed for this visit.  Subjective Assessment - 05/11/18 1145    Subjective   Pt reports that she had a good weekend and that she has been practicing writing her name.    Pertinent History  Patient reports she was diagnosed with MS years ago and has recently started to feel weak in her left dominant arm and hand.  She denies any previous therapy and reports memory issues and can provide a limited history.     Patient Stated Goals  Patient reports she wants to be able to walk better, use her left hand and be able to write again.     Currently in Pain?  Yes    Pain Score  4     Pain Location  Leg    Pain Orientation  Left    Pain Descriptors / Indicators  Aching;Sore    Pain Type  Chronic pain    Pain Onset  More than a month ago    Pain Frequency  Constant      OT TREATMENT  Neuromuscular re-education:   Pt. worked on left hand FMC grasping 1/2" small magnetic pegs, disconnecting them, and placing them on a  positioned at a vertical angle on an elevated surface to encourage [facility] skills with arms sustained in elevation. Pt. worked on removing the pegs while alternating thumb opposition to the tip of the 2nd through 5th digits. Pt continues to require increased verbal cuing to use the tip of each digit when removing pegs. Pt experiences increased tremors especially when reaching higher.  Self-care/IADLs:   Pt. worked on left hand writing legibility and speed. Pt. was able to write her name in print x3 in 2 mins and to write her name in cursive x3 in 2 mins. Pt. used a mature hand grasp on a standard pen with a larger grip and unlined paper. Pt. was able to hold the pen for the entire exercise. Pt did not significantly deviate from the line nor the margins during the task. Pt demonstrates decreased legibility today and noted feeling worried that she was being timed. Pt demonstrates decreased legibility when writing in cursive form.                 OT Education - 05/11/18 1153    Education provided  Yes  Education Details  Left hand [facility] skills and reaching    Person(s) Educated  Patient    Methods  Explanation;Demonstration;Verbal cues    Comprehension  Verbalized understanding;Verbal cues required;Returned demonstration          OT [place] Term Goals - 04/09/18 1458      OT Kaczmarczyk TERM GOAL #1   Title  Patient will demonstrate holding utensils in her left hand and feeding herself with modified independence with minimal spillage.      Baseline  04/09/2018: Pt spills >50% of food when using L hand. Pt is able to cut soft meats while using her fork.    Time  8    Period  Weeks    Status  On-going    Target Date  06/04/18      OT Nygren TERM GOAL #2   Title  Patient will complete tub/shower transfer with supervision only.    Baseline  04/09/2018 Pt requires min A to lift legs over tub for transfers    Time  8    Period  Weeks    Status  On-going    Target Date  06/04/18      OT Heard  TERM GOAL #4   Title  Patient will increase left UE strength by 1 mm grade to be able to reach into closet to retrieve clothing items.     Baseline  04/09/2018 - Pt LUE strength 4-/5. Pt can reach items in her closet independently    Time  12    Period  Weeks    Status  On-going    Target Date  07/02/18      OT Rosato TERM GOAL #5   Title  Patient will improve coordination on the left to be able to hold pen securely and write her name with 75% legibility.     Baseline  04/09/2018 - Pt prints name with ~60% legibility using standard pen with a grip    Time  12    Period  Weeks    Status  On-going    Target Date  07/02/18      OT Hazzard TERM GOAL #6   Title  Patient will complete lower body dressing with modified independence using adaptive equipment if needed.     Baseline  04/09/2018 - Pt requires min A to don pants. Pt requires increased time to tie shoes independently.    Time  12    Period  Weeks    Status  On-going    Target Date  07/02/18            Plan - 05/11/18 1153    Clinical Impression Statement  Pt continues to work to improve [facility] skills focusing on keeping arm sustained in elevation over head. Pt is working to improve these skills to increase her independence during ADLs and IADLs such as reaching into high cabinetry, reaching in her closet, or when raising her arms when bathing. Pt presents with increased tremors when reaching over head and fatigues quickly when reaching. Pt continues to work to improve her writing legibility and efficiency. Pt has stated a specific goal to improver her handwriting when signing in for treatment.     Occupational Profile and client history currently impacting functional performance  patient is legally blind, progressive disease process with MS for over 20+ years, lives alone, limited availability for transportation.      Occupational performance deficits (Please refer to evaluation for details):  ADL's;Leisure    Rehab Potential  Good     OT Frequency  2x / week    OT Duration  12 weeks    OT Treatment/Interventions  Therapeutic exercise;Cognitive remediation/compensation;Functional Mobility Training    Plan  Positive:  motivation, family support.  Negative:  lives alone, legally blind, requires caregiver 7 days a week for a few hours a day for self care    Clinical Decision Making  Several treatment options, min-mod task modification necessary    Consulted and Agree with Plan of Care  Patient       Patient will benefit from skilled therapeutic intervention in order to improve the following deficits and impairments:  Decreased cognition, Decreased knowledge of use of DME, Impaired vision/preception, Pain, Decreased coordination, Decreased activity tolerance, Decreased endurance, Decreased strength, Decreased balance, Impaired UE functional use  Visit Diagnosis: Other lack of coordination  Muscle weakness (generalized)    Problem List Patient Active Problem List   Diagnosis Date Noted  . Lower extremity edema 04/15/2017  . Macrocytic anemia 02/11/2017  . Vitamin D deficiency 02/02/2016  . Mixed hyperlipidemia 01/03/2015  . MS (multiple sclerosis) ([facility]) 01/03/2015  . Chronic pain 01/03/2015  . GERD without esophagitis 01/03/2015  . COPD (chronic obstructive pulmonary disease) (HCC) 01/03/2015  . Osteopenia 01/03/2015  . Neurogenic bladder 01/03/2015  . Incontinence in female 01/03/2015    Ernesto Rutherford, OTS 05/11/2018, 12:01 PM   This entire session was performed under direct supervision and direction of a licensed therapist/therapist assistant . I have personally read, edited and approve of the note as written.  Olegario Messier, MS, OTR/L   [place] [facility] MAIN [facility] SERVICES [address] [place], [place], [address] Phone: [phone]   Fax:  [phone]  Name: GURLEEN JEZIORSKI MRN: [id number] Date of Birth: [birth year]

## 2018-05-12 ENCOUNTER — Ambulatory Visit (INDEPENDENT_AMBULATORY_CARE_PROVIDER_SITE_OTHER): Payer: Medicare Other

## 2018-05-12 VITALS — BP 108/66 | HR 79 | Temp 98.9°F | Ht 64.0 in | Wt 149.4 lb

## 2018-05-12 DIAGNOSIS — Z0000 Encounter for general adult medical examination without abnormal findings: Secondary | ICD-10-CM

## 2018-05-12 DIAGNOSIS — Z1211 Encounter for screening for malignant neoplasm of colon: Secondary | ICD-10-CM | POA: Diagnosis not present

## 2018-05-12 NOTE — Patient Instructions (Signed)
[name] , Thank you for taking time to come for your Medicare Wellness Visit. I appreciate your ongoing commitment to your health goals. Please review the following plan we discussed and let me know if I can assist you in the future.   Screening recommendations/referrals: Colonoscopy: Cologuard order sent.  Mammogram: Order on file, pt to set up appointment.  Recommended yearly ophthalmology/optometry visit for glaucoma screening and checkup Recommended yearly dental visit for hygiene and checkup  Vaccinations: Influenza vaccine: Pt to check and see if she had this at Dr Weber Cooks office and then notify the clinic.  Pneumococcal vaccine: Not required until age 26 Tdap vaccine: Up to date, due 06/2026 Shingles vaccine: Not required until age 84    Advanced directives: Please bring a copy of your POA (Power of [place]) and/or Living Will to your next appointment.   Conditions/risks identified: Recommend to drink at least 6-8 8oz glasses of water per day.  Next appointment: 08/19/18 with Dr Parks Ranger.   Preventive Care 40-64 Years, Female Preventive care refers to lifestyle choices and visits with your health care provider that can promote health and wellness. What does preventive care include?  A yearly physical exam. This is also called an annual well check.  Dental exams once or twice a year.  Routine eye exams. Ask your health care provider how often you should have your eyes checked.  Personal lifestyle choices, including:  Daily care of your teeth and gums.  Regular physical activity.  Eating a healthy diet.  Avoiding tobacco and drug use.  Limiting alcohol use.  Practicing safe sex.  Taking low-dose aspirin daily starting at age 25.  Taking vitamin and mineral supplements as recommended by your health care provider. What happens during an annual well check? The services and screenings done by your health care provider during your annual well check will depend on  your age, overall health, lifestyle risk factors, and family history of disease. Counseling  Your health care provider may ask you questions about your:  Alcohol use.  Tobacco use.  Drug use.  Emotional well-being.  Home and relationship well-being.  Sexual activity.  Eating habits.  Work and work Statistician.  Method of birth control.  Menstrual cycle.  Pregnancy history. Screening  You may have the following tests or measurements:  Height, weight, and BMI.  Blood pressure.  Lipid and cholesterol levels. These may be checked every 5 years, or more frequently if you are over 66 years old.  Skin check.  Lung cancer screening. You may have this screening every year starting at age 22 if you have a 30-pack-year history of smoking and currently smoke or have quit within the past 15 years.  Fecal occult blood test (FOBT) of the stool. You may have this test every year starting at age 19.  Flexible sigmoidoscopy or colonoscopy. You may have a sigmoidoscopy every 5 years or a colonoscopy every 10 years starting at age 99.  Hepatitis C blood test.  Hepatitis B blood test.  Sexually transmitted disease (STD) testing.  Diabetes screening. This is done by checking your blood sugar (glucose) after you have not eaten for a while (fasting). You may have this done every 1-3 years.  Mammogram. This may be done every 1-2 years. Talk to your health care provider about when you should start having regular mammograms. This may depend on whether you have a family history of breast cancer.  BRCA-related cancer screening. This may be done if you have a family history of  breast, ovarian, tubal, or peritoneal cancers.  Pelvic exam and Pap test. This may be done every 3 years starting at age 39. Starting at age 15, this may be done every 5 years if you have a Pap test in combination with an HPV test.  Bone density scan. This is done to screen for osteoporosis. You may have this scan if  you are at high risk for osteoporosis. Discuss your test results, treatment options, and if necessary, the need for more tests with your health care provider. Vaccines  Your health care provider may recommend certain vaccines, such as:  Influenza vaccine. This is recommended every year.  Tetanus, diphtheria, and acellular pertussis (Tdap, Td) vaccine. You may need a Td booster every 10 years.  Zoster vaccine. You may need this after age 72.  Pneumococcal 13-valent conjugate (PCV13) vaccine. You may need this if you have certain conditions and were not previously vaccinated.  Pneumococcal polysaccharide (PPSV23) vaccine. You may need one or two doses if you smoke cigarettes or if you have certain conditions. Talk to your health care provider about which screenings and vaccines you need and how often you need them. This information is not intended to replace advice given to you by your health care provider. Make sure you discuss any questions you have with your health care provider. Document Released: 07/07/2015 Document Revised: 02/28/2016 Document Reviewed: 04/11/2015 Elsevier Interactive Patient Education  2017 [place] Prevention in the Home Falls can cause injuries. They can happen to people of all ages. There are many things you can do to make your home safe and to help prevent falls. What can I do on the outside of my home?  Regularly fix the edges of walkways and driveways and fix any cracks.  Remove anything that might make you trip as you walk through a door, such as a raised step or threshold.  Trim any bushes or trees on the path to your home.  Use bright outdoor lighting.  Clear any walking paths of anything that might make someone trip, such as rocks or tools.  Regularly check to see if handrails are loose or broken. Make sure that both sides of any steps have handrails.  Any raised decks and porches should have guardrails on the edges.  Have any  leaves, snow, or ice cleared regularly.  Use sand or salt on walking paths during winter.  Clean up any spills in your garage right away. This includes oil or grease spills. What can I do in the bathroom?  Use night lights.  Install grab bars by the toilet and in the tub and shower. Do not use towel bars as grab bars.  Use non-skid mats or decals in the tub or shower.  If you need to sit down in the shower, use a plastic, non-slip stool.  Keep the floor dry. Clean up any water that spills on the floor as soon as it happens.  Remove soap buildup in the tub or shower regularly.  Attach bath mats securely with double-sided non-slip rug tape.  Do not have throw rugs and other things on the floor that can make you trip. What can I do in the bedroom?  Use night lights.  Make sure that you have a light by your bed that is easy to reach.  Do not use any sheets or blankets that are too big for your bed. They should not hang down onto the floor.  Have a firm chair that has  side arms. You can use this for support while you get dressed.  Do not have throw rugs and other things on the floor that can make you trip. What can I do in the kitchen?  Clean up any spills right away.  Avoid walking on wet floors.  Keep items that you use a lot in easy-to-reach places.  If you need to reach something above you, use a strong step stool that has a grab bar.  Keep electrical cords out of the way.  Do not use floor polish or wax that makes floors slippery. If you must use wax, use non-skid floor wax.  Do not have throw rugs and other things on the floor that can make you trip. What can I do with my stairs?  Do not leave any items on the stairs.  Make sure that there are handrails on both sides of the stairs and use them. Fix handrails that are broken or loose. Make sure that handrails are as [place] as the stairways.  Check any carpeting to make sure that it is firmly attached to the stairs.  Fix any carpet that is loose or worn.  Avoid having throw rugs at the top or bottom of the stairs. If you do have throw rugs, attach them to the floor with carpet tape.  Make sure that you have a light switch at the top of the stairs and the bottom of the stairs. If you do not have them, ask someone to add them for you. What else can I do to help prevent falls?  Wear shoes that:  Do not have high heels.  Have rubber bottoms.  Are comfortable and fit you well.  Are closed at the toe. Do not wear sandals.  If you use a stepladder:  Make sure that it is fully opened. Do not climb a closed stepladder.  Make sure that both sides of the stepladder are locked into place.  Ask someone to hold it for you, if possible.  Clearly mark and make sure that you can see:  Any grab bars or handrails.  First and last steps.  Where the edge of each step is.  Use tools that help you move around (mobility aids) if they are needed. These include:  Canes.  Walkers.  Scooters.  Crutches.  Turn on the lights when you go into a dark area. Replace any light bulbs as soon as they burn out.  Set up your furniture so you have a clear path. Avoid moving your furniture around.  If any of your floors are uneven, fix them.  If there are any pets around you, be aware of where they are.  Review your medicines with your doctor. Some medicines can make you feel dizzy. This can increase your chance of falling. Ask your doctor what other things that you can do to help prevent falls. This information is not intended to replace advice given to you by your health care provider. Make sure you discuss any questions you have with your health care provider. Document Released: 04/06/2009 Document Revised: 11/16/2015 Document Reviewed: 07/15/2014 Elsevier Interactive Patient Education  2017 [employer].

## 2018-05-12 NOTE — Progress Notes (Signed)
Subjective:   [name] is a 62 y.o. female who presents for an Initial Medicare Annual Wellness Visit.  Review of Systems    N/A   Cardiac Risk Factors include: advanced age (>52men, >72 women);dyslipidemia     Objective:    Today's Vitals   05/12/18 1050 05/12/18 1055  BP: 108/66   Pulse: 79   Temp: 98.9 F (37.2 C)   TempSrc: Oral   Weight: 149 lb 6.4 oz (67.8 kg)   Height: 5\' 4"  (1.626 m)   PainSc: 3  3    Body mass index is 25.64 kg/m.  Advanced Directives 05/12/2018 04/13/2018 01/07/2018 10/28/2017  Does Patient Have a Medical Advance Directive? Yes No Yes Yes  Type of Estate agent of [place];Living will - Healthcare Power of [place];Living will Healthcare Power of [place];Living will  Does patient want to make changes to medical advance directive? - - - No - Patient declined  Copy of Healthcare Power of Attorney in Chart? - - - No - copy requested    Current Medications (verified) Outpatient Encounter Medications as of 05/12/2018  Medication Sig  . albuterol (PROAIR HFA) 108 (90 Base) MCG/ACT inhaler USE 2 PUFFS EVERY SIX HOURS AS NEEDED FOR WHEEZING OR SHORTNESS OF BREATH.  [name] Kitchen albuterol (PROVENTIL) (2.5 MG/3ML) 0.083% nebulizer solution Take 3 mLs (2.5 mg total) by nebulization every 6 (six) hours as needed for wheezing or shortness of breath.  [name] Kitchen alendronate (FOSAMAX) 70 MG tablet Take 1 tablet (70 mg total) by mouth every 7 (seven) days. Take with a full glass of water on an empty stomach.  . D-5000 5000 units TABS Take 1 tablet (5,000 Units total) by mouth daily. For 12 weeks, then start Vitamin D3 2,000 units daily (OTC)  . dalfampridine (AMPYRA) 10 MG TB12 Take 10 mg by mouth 2 (two) times daily.  . Dimethyl Fumarate (TECFIDERA) 240 MG CPDR Take 240 mg by mouth 2 (two) times daily.  [name] Kitchen gabapentin (NEURONTIN) 300 MG capsule Take 2 capsules (600 mg total) by mouth 3 (three) times daily.  . mirabegron ER (MYRBETRIQ) 50 MG TB24 tablet  Take 1 tablet (50 mg total) by mouth daily.  . Misc. Devices (CANE) MISC 1 each by Does not apply route as needed.  . pantoprazole (PROTONIX) 40 MG tablet Take 1 tablet (40 mg total) by mouth daily before breakfast.  . pravastatin (PRAVACHOL) 40 MG tablet TAKE ONE (1) TABLET EACH DAY  . SPIRIVA HANDIHALER 18 MCG inhalation capsule Place 1 capsule (18 mcg total) into inhaler and inhale daily.  . traMADol (ULTRAM) 50 MG tablet Take 1 tablet (50 mg total) by mouth daily as needed for moderate pain. Prescribed by Dr Malvin Johns [facility]   No facility-administered encounter medications on file as of 05/12/2018.     Allergies (verified) Penicillins   History: Past Medical History:  Diagnosis Date  . Headache   . Hyperlipidemia   . MS (multiple sclerosis) (HCC) 1990  . Neurogenic bladder    Past Surgical History:  Procedure Laterality Date  . ABDOMINAL HYSTERECTOMY    . OOPHORECTOMY Bilateral    Family History  Problem Relation Age of Onset  . Diabetes Mother   . Heart disease Mother   . Diabetes Father   . Heart disease Father   . Diabetes Sister   . Heart disease Sister    Social History   Socioeconomic History  . Marital status: Divorced    Spouse name: Not on file  . Number of  children: 1  . Years of education: Not on file  . Highest education level: Some college, no degree  Occupational History  . Occupation: retired  Engineer, production  . Financial resource strain: Somewhat hard  . Food insecurity:    Worry: Sometimes true    Inability: Sometimes true  . Transportation needs:    Medical: No    Non-medical: No  Tobacco Use  . Smoking status: Former Smoker    Packs/day: 1.00    Years: 20.00    Pack years: 20.00    Last attempt to quit: 01/03/1995    Years since quitting: 23.3  . Smokeless tobacco: Never Used  Substance and Sexual Activity  . Alcohol use: No    Alcohol/week: 0.0 standard drinks  . Drug use: Yes    Comment: smokes pods in past  . Sexual activity: Not  on file  Lifestyle  . Physical activity:    Days per week: 0 days    Minutes per session: 0 min  . Stress: Not at all  Relationships  . Social connections:    Talks on phone: Patient refused    Gets together: Patient refused    Attends religious service: Patient refused    Active member of club or organization: Patient refused    Attends meetings of clubs or organizations: Patient refused    Relationship status: Patient refused  Other Topics Concern  . Not on file  Social History Narrative  . Not on file    Tobacco Counseling Counseling given: Not Answered   Clinical Intake:  Pre-visit preparation completed: Yes  Pain : 0-10 Pain Score: 3  Pain Type: Chronic pain Pain Location: (left side of body) Pain Orientation: Left Pain Descriptors / Indicators: Aching Pain Frequency: Constant     Nutritional Status: BMI 25 -29 Overweight Nutritional Risks: None Diabetes: No  How often do you need to have someone help you when you read instructions, pamphlets, or other written materials from your doctor or pharmacy?: 5 - Always(Aid reads small print to pt. )  Interpreter Needed?: No  Information entered by :: [facility], LPN   Activities of Daily Living In your present state of health, do you have any difficulty performing the following activities: 05/12/2018  Hearing? N  Vision? Y  Comment Is legally blind.   Difficulty concentrating or making decisions? Y  Walking or climbing stairs? Y  Comment Avoids steps due to use of assistance devices.   Dressing or bathing? N  Doing errands, shopping? Y  Comment Does not drive.   Preparing Food and eating ? N  Using the Toilet? N  In the past six months, have you accidently leaked urine? Y  Comment Wears protection daily.   Do you have problems with loss of bowel control? N  Managing your Medications? Y  Comment Aid manages medications.   Managing your Finances? Y  Comment Aid manages finances.   Housekeeping or  managing your Housekeeping? N  Some recent data might be hidden     Immunizations and Health Maintenance Immunization History  Administered Date(s) Administered  . Influenza,inj,Quad PF,6+ Mos 03/04/2016, 02/25/2017  . Tdap 07/23/2016   Health Maintenance Due  Topic Date Due  . MAMMOGRAM  02/01/2007  . COLONOSCOPY  02/01/2007  . INFLUENZA VACCINE  01/22/2018    Patient Care Team: Smitty Cords, DO as PCP - General (Family Medicine) Morene Crocker, MD as Referring Physician (Neurology)  Indicate any recent Medical Services you may have received from other than  [facility] providers in the past year (date may be approximate).     Assessment:   This is a routine wellness examination for [name].  Hearing/Vision screen No exam data present  Dietary issues and exercise activities discussed: Current Exercise Habits: The patient does not participate in regular exercise at present, Exercise limited by: orthopedic condition(s);Other - see comments(Is legally blind)  Goals    . DIET - INCREASE WATER INTAKE     Recommend to drink at least 6-8 8oz glasses of water per day.       Depression Screen PHQ 2/9 Scores 05/12/2018 02/12/2018 05/29/2015  PHQ - 2 Score 1 0 0  PHQ- 9 Score - 2 -    Fall Risk Fall Risk  05/12/2018 05/29/2015  Falls in the past year? 0 No    FALL RISK PREVENTION PERTAINING TO THE HOME:  Any stairs in or around the home WITH handrails? No  Home free of loose throw rugs in walkways, pet beds, electrical cords, etc? Yes  Adequate lighting in your home to reduce risk of falls? Yes   ASSISTIVE DEVICES UTILIZED TO PREVENT FALLS:  Life alert? Yes  Use of a cane, walker or w/c? Yes  Grab bars in the bathroom? Yes  Shower chair or bench in shower? Yes  Elevated toilet seat or a handicapped toilet? Yes    TIMED UP AND GO:  Was the test performed? No .     Cognitive Function: Declined today.         Screening Tests Health Maintenance    Topic Date Due  . MAMMOGRAM  02/01/2007  . COLONOSCOPY  02/01/2007  . INFLUENZA VACCINE  01/22/2018  . TETANUS/TDAP  07/23/2026  . Hepatitis C Screening  Completed  . HIV Screening  Completed    Qualifies for Shingles Vaccine? No    Tdap: Up to date  Flu Vaccine: Does the patient want to receive this vaccine today?  No . Pt states she thinks she had this at Dr Geralyn Flash office. Asked nurse aid to look into this and notify us.   Cancer Screenings:  Colorectal Screening: Cologuard referral placed today.   Mammogram: Pt to set up apt in the future. Order on file.    Lung Cancer Screening: (Low Dose CT Chest recommended if Age 70-80 years, 30 pack-year currently smoking OR have quit w/in 15years.) does not qualify.    Additional Screening:  Hepatitis C Screening: Up to date  Vision Screening: Recommended annual ophthalmology exams for early detection of glaucoma and other disorders of the eye.  Dental Screening: Recommended annual dental exams for proper oral hygiene  Community Resource Referral:  CRR required this visit?  Yes, for food assistance. Referral placed, will contact nurse aid [name]).       Plan:  I have personally reviewed and addressed the Medicare Annual Wellness questionnaire and have noted the following in the patient's chart:  A. Medical and social history B. Use of alcohol, tobacco or illicit drugs  C. Current medications and supplements D. Functional ability and status E.  Nutritional status F.  Physical activity G. Advance directives H. List of other physicians I.  Hospitalizations, surgeries, and ER visits in previous 12 months J.  Vitals K. Screenings such as hearing and vision if needed, cognitive and depression L. Referrals and appointments - none  In addition, I have reviewed and discussed with patient certain preventive protocols, quality metrics, and best practice recommendations. A written personalized care plan for preventive services  as well as  general preventive health recommendations were provided to patient.  See attached scanned questionnaire for additional information.   Signed,  Hyacinth Meeker, LPN Nurse Health Advisor   Nurse Recommendations: Cologuard order sent. Pt plans to set up a mammogram (order on file). Pt declined the influenza vaccine and states she believes she's had this completed at Dr Geralyn Flash office. Requested aid to check into this and let the clinic know. Advised pt to come back to the clinic to receive if she has not had the vaccine.

## 2018-05-13 ENCOUNTER — Ambulatory Visit: Payer: Medicare Other | Admitting: Physical Therapy

## 2018-05-13 ENCOUNTER — Encounter: Payer: Self-pay | Admitting: Physical Therapy

## 2018-05-13 ENCOUNTER — Encounter: Payer: Self-pay | Admitting: Occupational Therapy

## 2018-05-13 ENCOUNTER — Ambulatory Visit: Payer: Medicare Other | Admitting: Occupational Therapy

## 2018-05-13 DIAGNOSIS — R262 Difficulty in walking, not elsewhere classified: Secondary | ICD-10-CM

## 2018-05-13 DIAGNOSIS — M6281 Muscle weakness (generalized): Secondary | ICD-10-CM | POA: Diagnosis not present

## 2018-05-13 DIAGNOSIS — R278 Other lack of coordination: Secondary | ICD-10-CM

## 2018-05-13 NOTE — Therapy (Signed)
[place] MAIN [facility] SERVICES [address] [place], [place], [address] Phone: [phone]   Fax:  [phone]  Physical Therapy Treatment/Physical Therapy Progress Note   Dates of reporting period 04/01/18   to  05/13/18  Patient Details  Name: [name] MRN: [id number] Date of Birth: Sep 22, 1955 Referring Provider (PT): Anabel Bene   Encounter Date: 05/13/2018  PT End of Session - 05/13/18 1134    Visit Number  30    Number of Visits  41    Date for PT Re-Evaluation  05/27/18    Authorization Type  10/10    PT Start Time  1105    PT Stop Time  1145    PT Time Calculation (min)  40 min    Equipment Utilized During Treatment  Gait belt    Activity Tolerance  Patient tolerated treatment well    Behavior During Therapy  WFL for tasks assessed/performed       Past Medical History:  Diagnosis Date  . Headache   . Hyperlipidemia   . MS (multiple sclerosis) ([place]) 1990  . Neurogenic bladder     Past Surgical History:  Procedure Laterality Date  . ABDOMINAL HYSTERECTOMY    . OOPHORECTOMY Bilateral     There were no vitals filed for this visit.  Subjective Assessment - 05/13/18 1133    Subjective  Patient states she is feeling well today. She denies any falls or significant changes since the last visit.    Pertinent History  Patient reports about 3 months ago she started to feel weak in the left arm, reports she is legally blind.       How [place] can you stand comfortably?  not too [place]     Patient Stated Goals  Patient wants to be able to walk better.     Currently in Pain?  No/denies    Pain Score  0-No pain    Pain Onset  More than a month ago       Treatment: Outcome measures including TUG, 10 MW, 5 x sit to stand, 6 MW   Neuromuscular Re-education  Backwards ambulation with HHA 100 feet Side stepping left and right with HHA 100 feet Toe taps to 6" step without UE support alternating LE x15 each  Step up to 6  inch stool x 20 with UE assist Normal stance on Airex pad 2x60sec ?  Forward and backward stepping over hurdle x15 each direction  SIde stepping over hurdle 15x each direction    CGA and Min verbal cues used throughout with increased in postural sway and LOB most seen with narrow base of support and while on uneven surfaces. Continues to have balance deficits typical with diagnosis. Patient performs intermediate level exercises without pain behaviors and needs verbal cuing for postural alignment and head positioning Tactile cues and assistance needed to keep lower leg and knee in neutral to avoid compensations with ankle motions.                     PT Education - 05/13/18 1134    Education Details  Patient educated on balance strategies, safety, and technique throughout the session    Person(s) Educated  Patient    Methods  Explanation    Comprehension  Verbalized understanding;Need further instruction       PT Short Term Goals - 05/13/18 1135      PT SHORT TERM GOAL #1   Title  Patient will be  independent in home exercise program to improve strength/mobility for better functional independence with ADLs.    Time  6    Period  Weeks    Status  On-going    Target Date  05/27/18      PT SHORT TERM GOAL #2   Title  nt (< 23 years old) will complete five times sit to stand test in < 10 seconds indicating an increased LE strength and improved balance.    Baseline  02/11/18= 19.38; 03/16/18: 18.0s,  05/13/18=18.06 sec    Time  6    Period  Weeks    Status  On-going    Target Date  05/27/18        PT [place] Term Goals - 05/13/18 1137      PT [place] TERM GOAL #1   Title  Patient will increase six minute walk test distance to >1000 for progression to community ambulator and improve gait ability  (Pended)     Baseline  02/11/18= 500 feet; 03/16/18: 830 feet,  04/01/18= 515,05/13/18 605 ft  (Pended)     Time  12  (Pended)     Period  Weeks  (Pended)     Status  Partially Met   (Pended)     Target Date  05/27/18  (Pended)       PT [place] TERM GOAL #2   Title  Patient will increase 10 meter walk test to >1.65ms as to improve gait speed for better community ambulation and to reduce fall risk.  (Pended)     Baseline  02/11/18 . 68 m/sec; 03/16/18: self-selected: 12.6s= 0.79 m/s fastest: 10.4s = 0.96 m/s, 04/01/18= .1.0 m/sec,05/13/18=  (Pended)     Time  12  (Pended)     Period  Weeks  (Pended)     Status  Partially Met  (Pended)     Target Date  05/27/18  (Pended)       PT M[place] TERM GOAL #3   Title  Patient will reduce timed up and go to <11 seconds to reduce fall risk and demonstrate improved transfer/gait ability.  (Pended)     Baseline  17.49 sec 02/11/18; 03/16/18: 16.8s, 04/01/18= 15.9 sec, 05/13/18 14.87 sec  (Pended)     Time  12  (Pended)     Period  Weeks  (Pended)     Status  Partially Met  (Pended)     Target Date  05/27/18  (Pended)       PT Thier TERM GOAL #4   Title  Patient (> 61years old) will complete five times sit to stand test in < 15 seconds indicating an increased LE strength and improved balance.  (Pended)     Baseline  19.38sec 02/11/18; 03/16/18: 18.0s, 04/01/18= 17.04 sec, 05/13/18= 18.06 sec  (Pended)     Time  12  (Pended)     Period  Weeks  (Pended)     Status  Partially Met  (Pended)     Target Date  05/27/18  (Pended)             Plan - 05/13/18 1302    Clinical Impression Statement Patient's condition has the potential to improve in response to therapy. Maximum improvement is yet to be obtained. The anticipated improvement is attainable and reasonable in a generally predictable time.  Patient reports that she feels like she is walking better.  Pt continue to present with unsteadiness on uneven surfaces and fatigues with therapeutic exercises. Patient tolerated all interventions well this  date with good motivation throughout session. She requires intermittent seated rest breaks due to fatigue. Pt will benefit from continued skilled PT  interventions to improve strength and balance and decrease risk of falling     PT Frequency  2x / week    PT Duration  8 weeks    PT Treatment/Interventions  Gait training;Therapeutic exercise;Therapeutic activities;Functional mobility training;Neuromuscular re-education;Patient/family education;Balance training    PT Next Visit Plan  strengthening, balance training    Consulted and Agree with Plan of Care  Patient       Patient will benefit from skilled therapeutic intervention in order to improve the following deficits and impairments:  Abnormal gait, Decreased balance, Decreased endurance, Postural dysfunction, Impaired flexibility, Decreased strength, Decreased safety awareness, Decreased activity tolerance, Decreased range of motion, Decreased knowledge of precautions, Impaired sensation, Difficulty walking  Visit Diagnosis: No diagnosis found.     Problem List Patient Active Problem List   Diagnosis Date Noted  . Lower extremity edema 04/15/2017  . Macrocytic anemia 02/11/2017  . Vitamin D deficiency 02/02/2016  . Mixed hyperlipidemia 01/03/2015  . MS (multiple sclerosis) ([place]) 01/03/2015  . Chronic pain 01/03/2015  . GERD without esophagitis 01/03/2015  . COPD (chronic obstructive pulmonary disease) ([place]) 01/03/2015  . Osteopenia 01/03/2015  . Neurogenic bladder 01/03/2015  . Incontinence in female 01/03/2015    Alanson Puls, PT DPT 05/13/2018, 1:03 PM  [place] MAIN [facility] SERVICES [address] [place], [place], [address] Phone: [phone]   Fax:  [phone]  Name: MELYNDA KRZYWICKI MRN: [id number] Date of Birth: [birth year]

## 2018-05-13 NOTE — Therapy (Addendum)
[facility] MAIN [facility] SERVICES [address] [place], [place], [address] Phone: [phone]   Fax:  [phone]  Occupational Therapy Treatment  Patient Details  Name: [name] MRN: [id number] Date of Birth: 08/03/1955 No data recorded  Encounter Date: 05/13/2018  OT End of Session - 05/13/18 1147    Visit Number  45    Number of Visits  48    Date for OT Re-Evaluation  07/02/17    Authorization Type  Visit 7 of 10 for progress report staring 04/14/2018    OT Start Time  1145    OT Stop Time  1230    OT Time Calculation (min)  45 min    Activity Tolerance  Patient tolerated treatment well    Behavior During Therapy  [facility] for tasks assessed/performed       Past Medical History:  Diagnosis Date  . Headache   . Hyperlipidemia   . MS (multiple sclerosis) ([facility]) 1990  . Neurogenic bladder     Past Surgical History:  Procedure Laterality Date  . ABDOMINAL HYSTERECTOMY    . OOPHORECTOMY Bilateral     There were no vitals filed for this visit.  Subjective Assessment - 05/13/18 1144    Subjective   Pt reports having a good day today and has been doing more in the kitchen including washing dishes.    Pertinent History  Patient reports she was diagnosed with MS years ago and has recently started to feel weak in her left dominant arm and hand.  She denies any previous therapy and reports memory issues and can provide a limited history.     Patient Stated Goals  Patient reports she wants to be able to walk better, use her left hand and be able to write again.     Currently in Pain?  Yes    Pain Score  4     Pain Location  Leg   Entire left side   Pain Orientation  Left    Pain Descriptors / Indicators  Aching    Pain Type  Chronic pain    Pain Onset  More than a month ago    Pain Frequency  Constant    Aggravating Factors   Prolonged standing    Pain Relieving Factors  Walking    Multiple Pain Sites  No      OT  TREATMENT  Neuromuscular re-education:   Pt. worked on reaching using the shape tower focusing on using her left hand and UE. Pt. grasped and moved the shapes through 4 vertical dowels of varying heights. Pt required verbal cuing and demonstration for hand placement when removing shapes of various sizes. Pt required increased time and x5 rest breaks during the task as her LUE fatigues quickly when reaching above head.  Self-care:   Pt. worked on Careers information officer while writing words from various categories prompted by the therapist. Pt. used a mature hand grasp on the pen and had to readjust her hand frequently during the exercise. Pt demonstrated poor line and letter spacing. Pt wrote very large today and did not follow a designated formation on the unlined paper. Pt demonstrated ~50% legibility while writing in print today.                OT Education - 05/13/18 1147    Education provided  Yes    Education Details  Left hand [facility] skills and reaching    Person(s) Educated  Patient  Methods  Explanation;Demonstration;Verbal cues    Comprehension  Verbalized understanding;Verbal cues required;Returned demonstration          OT [place] Term Goals - 04/09/18 1458      OT [place] TERM GOAL #1   Title  Patient will demonstrate holding utensils in her left hand and feeding herself with modified independence with minimal spillage.      Baseline  04/09/2018: Pt spills >50% of food when using L hand. Pt is able to cut soft meats while using her fork.    Time  8    Period  Weeks    Status  On-going    Target Date  06/04/18      OT [place] TERM GOAL #2   Title  Patient will complete tub/shower transfer with supervision only.    Baseline  04/09/2018 Pt requires min A to lift legs over tub for transfers    Time  8    Period  Weeks    Status  On-going    Target Date  06/04/18      OT [place] TERM GOAL #4   Title  Patient will increase left UE strength by 1 mm grade to be able to reach  into closet to retrieve clothing items.     Baseline  04/09/2018 - Pt LUE strength 4-/5. Pt can reach items in her closet independently    Time  12    Period  Weeks    Status  On-going    Target Date  07/02/18      OT [place] TERM GOAL #5   Title  Patient will improve coordination on the left to be able to hold pen securely and write her name with 75% legibility.     Baseline  04/09/2018 - Pt prints name with ~60% legibility using standard pen with a grip    Time  12    Period  Weeks    Status  On-going    Target Date  07/02/18      OT [place] TERM GOAL #6   Title  Patient will complete lower body dressing with modified independence using adaptive equipment if needed.     Baseline  04/09/2018 - Pt requires min A to don pants. Pt requires increased time to tie shoes independently.    Time  12    Period  Weeks    Status  On-going    Target Date  07/02/18            Plan - 05/13/18 1147    Clinical Impression Statement Pt continues to work on LUE strength and [facility] skills focusing on keeping arm sustained in elevation over head. Pt is working to improve these skills to increase her independence during ADLs and IADLs. Pt continues to present with increased tremors when reaching over head and fatigues quickly when reaching. Pt worked on improving her writing legibility and efficiency to promote independence during daily tasks.    Occupational Profile and client history currently impacting functional performance  patient is legally blind, progressive disease process with MS for over 20+ years, lives alone, limited availability for transportation.      Occupational performance deficits (Please refer to evaluation for details):  ADL's;Leisure    Rehab Potential  Good    OT Frequency  2x / week    OT Duration  12 weeks    OT Treatment/Interventions  Therapeutic exercise;Cognitive remediation/compensation;Functional Mobility Training    Plan  Positive:  motivation, family support.  Negative:   lives  alone, legally blind, requires caregiver 7 days a week for a few hours a day for self care    Clinical Decision Making  Several treatment options, min-mod task modification necessary    Consulted and Agree with Plan of Care  Patient       Patient will benefit from skilled therapeutic intervention in order to improve the following deficits and impairments:  Decreased cognition, Decreased knowledge of use of DME, Impaired vision/preception, Pain, Decreased coordination, Decreased activity tolerance, Decreased endurance, Decreased strength, Decreased balance, Impaired UE functional use  Visit Diagnosis: Other lack of coordination  Muscle weakness (generalized)    Problem List Patient Active Problem List   Diagnosis Date Noted  . Lower extremity edema 04/15/2017  . Macrocytic anemia 02/11/2017  . Vitamin D deficiency 02/02/2016  . Mixed hyperlipidemia 01/03/2015  . MS (multiple sclerosis) ([facility]) 01/03/2015  . Chronic pain 01/03/2015  . GERD without esophagitis 01/03/2015  . COPD (chronic obstructive pulmonary disease) ([facility]) 01/03/2015  . Osteopenia 01/03/2015  . Neurogenic bladder 01/03/2015  . Incontinence in female 01/03/2015    Ernesto Rutherford, OTS 05/13/2018, 12:01 PM   This entire session was performed under direct supervision and direction of a licensed therapist/therapist assistant . I have personally read, edited and approve of the note as written.  Olegario Messier, MS, OTR/L   [place] [facility] MAIN [facility] SERVICES [address] [place], [place], [address] Phone: [phone]   Fax:  [phone]  Name: [name] MRN: [id number] Date of Birth: [birth year]

## 2018-05-18 ENCOUNTER — Encounter: Payer: Self-pay | Admitting: Occupational Therapy

## 2018-05-18 ENCOUNTER — Ambulatory Visit: Payer: Medicare Other | Admitting: Occupational Therapy

## 2018-05-18 ENCOUNTER — Telehealth: Payer: Self-pay | Admitting: Family Medicine

## 2018-05-18 ENCOUNTER — Ambulatory Visit: Payer: Medicare Other | Admitting: Physical Therapy

## 2018-05-18 DIAGNOSIS — M6281 Muscle weakness (generalized): Secondary | ICD-10-CM | POA: Diagnosis not present

## 2018-05-18 DIAGNOSIS — R278 Other lack of coordination: Secondary | ICD-10-CM

## 2018-05-18 DIAGNOSIS — R262 Difficulty in walking, not elsewhere classified: Secondary | ICD-10-CM | POA: Diagnosis not present

## 2018-05-18 NOTE — Therapy (Addendum)
[place] [facility] MAIN [facility] SERVICES [address] [place], [place], [address] Phone: [phone]   Fax:  [phone]  Occupational Therapy Treatment  Patient Details  Name: [name] MRN: [id number] Date of Birth: 07-01-1955 No data recorded  Encounter Date: 05/18/2018  OT End of Session - 05/18/18 1204    Visit Number  46    Number of Visits  48    Date for OT Re-Evaluation  07/02/17    Authorization Type  Visit 8 of 10 for progress report staring 04/14/2018    OT Start Time  1150    OT Stop Time  1230    OT Time Calculation (min)  40 min    Activity Tolerance  Patient tolerated treatment well    Behavior During Therapy  [facility] for tasks assessed/performed       Past Medical History:  Diagnosis Date  . Headache   . Hyperlipidemia   . MS (multiple sclerosis) ([facility]) 1990  . Neurogenic bladder     Past Surgical History:  Procedure Laterality Date  . ABDOMINAL HYSTERECTOMY    . OOPHORECTOMY Bilateral     There were no vitals filed for this visit.  Subjective Assessment - 05/18/18 1202    Subjective   Pt reports feeling frustrated as she is having complications with her transportation today.    Pertinent History  Patient reports she was diagnosed with MS years ago and has recently started to feel weak in her left dominant arm and hand.  She denies any previous therapy and reports memory issues and can provide a limited history.     Patient Stated Goals  Patient reports she wants to be able to walk better, use her left hand and be able to write again.     Currently in Pain?  Yes    Pain Score  4     Pain Location  Leg    Pain Orientation  Left    Pain Type  Chronic pain    Pain Onset  More than a month ago    Pain Frequency  Constant    Multiple Pain Sites  No      OT TREATMENT  Neuromuscular re-education:  Pt. worked on left hand [facility] grasping 1/2" small magnetic pegs, disconnecting them, and placing them on a positioned at a  vertical angle on an elevated angle to encourage [facility] skills with arms sustained in elevation. Pt. worked on removing the pegs while alternating thumb opposition to the tip of the 2nd through 5th digits. Pt continues to require increased cuing to use her left hand for tasks. Pt required mod verbal cuing to grasp pegs with the tip of her digits when removing the pegs. Pt required x4 rest breaks and reported increased arm fatigue as the task progressed.                 OT Education - 05/18/18 1204    Education provided  Yes    Education Details  Left hand [facility] skills and reaching    Person(s) Educated  Patient    Methods  Explanation;Demonstration;Verbal cues    Comprehension  Verbalized understanding;Verbal cues required;Returned demonstration          OT [place] Term Goals - 04/09/18 1458      OT [place] TERM GOAL #1   Title  Patient will demonstrate holding utensils in her left hand and feeding herself with modified independence with minimal spillage.      Baseline  04/09/2018: Pt spills >50% of food when using L hand. Pt is able to cut soft meats while using her fork.    Time  8    Period  Weeks    Status  On-going    Target Date  06/04/18      OT [place] TERM GOAL #2   Title  Patient will complete tub/shower transfer with supervision only.    Baseline  04/09/2018 Pt requires min A to lift legs over tub for transfers    Time  8    Period  Weeks    Status  On-going    Target Date  06/04/18      OT [place] TERM GOAL #4   Title  Patient will increase left UE strength by 1 mm grade to be able to reach into closet to retrieve clothing items.     Baseline  04/09/2018 - Pt LUE strength 4-/5. Pt can reach items in her closet independently    Time  12    Period  Weeks    Status  On-going    Target Date  07/02/18      OT [place] TERM GOAL #5   Title  Patient will improve coordination on the left to be able to hold pen securely and write her name with 75% legibility.     Baseline   04/09/2018 - Pt prints name with ~60% legibility using standard pen with a grip    Time  12    Period  Weeks    Status  On-going    Target Date  07/02/18      OT [place] TERM GOAL #6   Title  Patient will complete lower body dressing with modified independence using adaptive equipment if needed.     Baseline  04/09/2018 - Pt requires min A to don pants. Pt requires increased time to tie shoes independently.    Time  12    Period  Weeks    Status  On-going    Target Date  07/02/18            Plan - 05/18/18 1205    Clinical Impression Statement  Pt continues to work on LUE strength and [facility] skills. Pt focused on keeping arms sustained in elevation. Pt continues to make progress as she fatigues less quickly compared to previous sessions. Pt worked on using utensils using her left hand while mimizing spillage. Pt to continue to work on these skills to promote independence during ADLs and IADLs.    Occupational Profile and client history currently impacting functional performance  patient is legally blind, progressive disease process with MS for over 20+ years, lives alone, limited availability for transportation.      Occupational performance deficits (Please refer to evaluation for details):  ADL's;Leisure    Rehab Potential  Good    OT Frequency  2x / week    OT Duration  12 weeks    OT Treatment/Interventions  Therapeutic exercise;Cognitive remediation/compensation;Functional Mobility Training    Plan  Positive:  motivation, family support.  Negative:  lives alone, legally blind, requires caregiver 7 days a week for a few hours a day for self care    Clinical Decision Making  Several treatment options, min-mod task modification necessary    Consulted and Agree with Plan of Care  Patient       Patient will benefit from skilled therapeutic intervention in order to improve the following deficits and impairments:  Decreased cognition, Decreased knowledge of use of DME,  Impaired  vision/preception, Pain, Decreased coordination, Decreased activity tolerance, Decreased endurance, Decreased strength, Decreased balance, Impaired UE functional use  Visit Diagnosis: Other lack of coordination    Problem List Patient Active Problem List   Diagnosis Date Noted  . Lower extremity edema 04/15/2017  . Macrocytic anemia 02/11/2017  . Vitamin D deficiency 02/02/2016  . Mixed hyperlipidemia 01/03/2015  . MS (multiple sclerosis) ([facility]) 01/03/2015  . Chronic pain 01/03/2015  . GERD without esophagitis 01/03/2015  . COPD (chronic obstructive pulmonary disease) ([facility]) 01/03/2015  . Osteopenia 01/03/2015  . Neurogenic bladder 01/03/2015  . Incontinence in female 01/03/2015    Ernesto Rutherford, OTS 05/18/2018, 12:11 PM   This entire session was performed under direct supervision and direction of a licensed therapist/therapist assistant . I have personally read, edited and approve of the note as written.  Olegario Messier, MS, OTR/L   [place] [facility] MAIN [facility] SERVICES [address] [place], [place], [address] Phone: [phone]   Fax:  [phone]  Name: GEARL KIMBROUGH MRN: [id number] Date of Birth: 1955/12/31

## 2018-05-18 NOTE — Telephone Encounter (Signed)
Called Pt regarding Community Resource Referral no vm set up  c/b # [phone]  Manuela Schwartz Care Guide

## 2018-05-20 ENCOUNTER — Encounter: Payer: Self-pay | Admitting: Occupational Therapy

## 2018-05-20 ENCOUNTER — Ambulatory Visit: Payer: Medicare Other | Admitting: Occupational Therapy

## 2018-05-20 ENCOUNTER — Ambulatory Visit: Payer: Medicare Other | Admitting: Physical Therapy

## 2018-05-20 ENCOUNTER — Encounter: Payer: Self-pay | Admitting: Physical Therapy

## 2018-05-20 DIAGNOSIS — R278 Other lack of coordination: Secondary | ICD-10-CM

## 2018-05-20 DIAGNOSIS — R262 Difficulty in walking, not elsewhere classified: Secondary | ICD-10-CM | POA: Diagnosis not present

## 2018-05-20 DIAGNOSIS — M6281 Muscle weakness (generalized): Secondary | ICD-10-CM

## 2018-05-20 NOTE — Therapy (Signed)
[place] MAIN [facility] SERVICES [address] [place], [place], [address] Phone: [phone]   Fax:  [phone]  Physical Therapy Treatment  Patient Details  Name: [name] MRN: [id number] Date of Birth: Apr 26, 1956 Referring Provider (PT): Anabel Bene   Encounter Date: 05/20/2018  PT End of Session - 05/20/18 1246    Visit Number  31    Number of Visits  41    Date for PT Re-Evaluation  05/27/18    Authorization Type  1/10    PT Start Time  1300    PT Stop Time  1340    PT Time Calculation (min)  40 min    Equipment Utilized During Treatment  Gait belt    Activity Tolerance  Patient tolerated treatment well    Behavior During Therapy  WFL for tasks assessed/performed       Past Medical History:  Diagnosis Date  . Headache   . Hyperlipidemia   . MS (multiple sclerosis) ([place]) 1990  . Neurogenic bladder     Past Surgical History:  Procedure Laterality Date  . ABDOMINAL HYSTERECTOMY    . OOPHORECTOMY Bilateral     There were no vitals filed for this visit.  Subjective Assessment - 05/20/18 1248    Subjective  Patient states that her L side still bothers her. Patient denies any falls or stumbles since her last visit. And reports that "God's been good to me."     Pertinent History  Patient reports about 3 months ago she started to feel weak in the left arm, reports she is legally blind.       How [place] can you stand comfortably?  not too [place]     Patient Stated Goals  Patient wants to be able to walk better.     Currently in Pain?  Yes    Pain Score  4     Pain Location  Leg    Pain Orientation  Left    Pain Descriptors / Indicators  Aching    Pain Type  Chronic pain    Pain Onset  More than a month ago    Pain Frequency  Constant    Multiple Pain Sites  No      TREATMENT  Ther-ex  Standing hip abd with 2.5# ankle weight x 25 BLE and  BUE support  Standing hip extension with 2.5# ankle weight x 20 BLE with BUE  support  Standing hip flexion marches with 2.5# ankle weight x 20 BLE with BUE support  Standing HS curls with 2.5# ankle weight x 20 BLE with BUE support  Standing heel raises x 20 with 2.5# ankle weights  LAQ with 2.5# ankle weights with 3 second holds x20 each LE   STS from chair without UE support 2x10      Neuromuscular Re-education  Toe taps to 6" step with SUE x10 each, without UE support x10 each  Normal stance on Airex pad 2x60sec (requires CGA  and SUE for falls recovery 1x)?       PT Education - 05/20/18 1246    Education Details  Exercise technique, body mechanics, safety, balance strategies    Person(s) Educated  Patient    Methods  Explanation;Demonstration;Verbal cues    Comprehension  Verbalized understanding;Need further instruction;Verbal cues required;Returned demonstration       PT Short Term Goals - 05/13/18 1135      PT SHORT TERM GOAL #1   Title  Patient will be  independent in home exercise program to improve strength/mobility for better functional independence with ADLs.    Time  6    Period  Weeks    Status  On-going    Target Date  05/27/18      PT SHORT TERM GOAL #2   Title  nt (< 61 years old) will complete five times sit to stand test in < 10 seconds indicating an increased LE strength and improved balance.    Baseline  02/11/18= 19.38; 03/16/18: 18.0s,  05/13/18=18.06 sec    Time  6    Period  Weeks    Status  On-going    Target Date  05/27/18        PT Cameron Term Goals - 05/13/18 1137      PT Mcmanus TERM GOAL #1   Title  Patient will increase six minute walk test distance to >1000 for progression to community ambulator and improve gait ability  (Pended)     Baseline  02/11/18= 500 feet; 03/16/18: 830 feet,  04/01/18= 515,05/13/18 605 ft  (Pended)     Time  12  (Pended)     Period  Weeks  (Pended)     Status  Partially Met  (Pended)     Target Date  05/27/18  (Pended)       PT [place] TERM GOAL #2   Title  Patient will increase 10 meter walk  test to >1.83ms as to improve gait speed for better community ambulation and to reduce fall risk.  (Pended)     Baseline  02/11/18 . 68 m/sec; 03/16/18: self-selected: 12.6s= 0.79 m/s fastest: 10.4s = 0.96 m/s, 04/01/18= .1.0 m/sec,05/13/18=  (Pended)     Time  12  (Pended)     Period  Weeks  (Pended)     Status  Partially Met  (Pended)     Target Date  05/27/18  (Pended)       PT Gao TERM GOAL #3   Title  Patient will reduce timed up and go to <11 seconds to reduce fall risk and demonstrate improved transfer/gait ability.  (Pended)     Baseline  17.49 sec 02/11/18; 03/16/18: 16.8s, 04/01/18= 15.9 sec, 05/13/18 14.87 sec  (Pended)     Time  12  (Pended)     Period  Weeks  (Pended)     Status  Partially Met  (Pended)     Target Date  05/27/18  (Pended)       PT Merkley TERM GOAL #4   Title  Patient (> 61years old) will complete five times sit to stand test in < 15 seconds indicating an increased LE strength and improved balance.  (Pended)     Baseline  19.38sec 02/11/18; 03/16/18: 18.0s, 04/01/18= 17.04 sec, 05/13/18= 18.06 sec  (Pended)     Time  12  (Pended)     Period  Weeks  (Pended)     Status  Partially Met  (Pended)     Target Date  05/27/18  (Pended)             Plan - 05/20/18 1249    Clinical Impression Statement  Patient presents to clinic    PT Frequency  2x / week    PT Duration  8 weeks    PT Treatment/Interventions  Gait training;Therapeutic exercise;Therapeutic activities;Functional mobility training;Neuromuscular re-education;Patient/family education;Balance training    PT Next Visit Plan  strengthening, balance training    Consulted and Agree with Plan of Care  Patient  Patient will benefit from skilled therapeutic intervention in order to improve the following deficits and impairments:  Abnormal gait, Decreased balance, Decreased endurance, Postural dysfunction, Impaired flexibility, Decreased strength, Decreased safety awareness, Decreased activity tolerance,  Decreased range of motion, Decreased knowledge of precautions, Impaired sensation, Difficulty walking  Visit Diagnosis: Other lack of coordination  Muscle weakness (generalized)  Difficulty in walking, not elsewhere classified     Problem List Patient Active Problem List   Diagnosis Date Noted  . Lower extremity edema 04/15/2017  . Macrocytic anemia 02/11/2017  . Vitamin D deficiency 02/02/2016  . Mixed hyperlipidemia 01/03/2015  . MS (multiple sclerosis) ([place]) 01/03/2015  . Chronic pain 01/03/2015  . GERD without esophagitis 01/03/2015  . COPD (chronic obstructive pulmonary disease) ([place]) 01/03/2015  . Osteopenia 01/03/2015  . Neurogenic bladder 01/03/2015  . Incontinence in female 01/03/2015    Myles Gip PT, DPT [phone] 05/20/2018, 1:04 PM  [place] MAIN [facility] SERVICES [address] [place], [place], [address] Phone: [phone]   Fax:  [phone]  Name: ZAILYNN BRANDEL MRN: [id number] Date of Birth: 11/03/1955

## 2018-05-20 NOTE — Therapy (Addendum)
[place] [facility] MAIN [facility] SERVICES [address] [place], [place], [address] Phone: [phone]   Fax:  [phone]  Occupational Therapy Treatment  Patient Details  Name: [name] MRN: [id number] Date of Birth: 01-Jun-1956 No data recorded  Encounter Date: 05/20/2018  OT End of Session - 05/20/18 1402    Visit Number  47    Number of Visits  48    Date for OT Re-Evaluation  07/02/17    Authorization Type  Visit 9 of 10 for progress report staring 04/14/2018    OT Start Time  1345    OT Stop Time  1430    OT Time Calculation (min)  45 min    Activity Tolerance  Patient tolerated treatment well    Behavior During Therapy  [facility] for tasks assessed/performed       Past Medical History:  Diagnosis Date  . Headache   . Hyperlipidemia   . MS (multiple sclerosis) ([facility]) 1990  . Neurogenic bladder     Past Surgical History:  Procedure Laterality Date  . ABDOMINAL HYSTERECTOMY    . OOPHORECTOMY Bilateral     There were no vitals filed for this visit.  Subjective Assessment - 05/20/18 1401    Subjective   Pt reports having a good day and looking forward to the holiday tomorrow.    Pertinent History  Patient reports she was diagnosed with MS years ago and has recently started to feel weak in her left dominant arm and hand.  She denies any previous therapy and reports memory issues and can provide a limited history.     Patient Stated Goals  Patient reports she wants to be able to walk better, use her left hand and be able to write again.     Currently in Pain?  Yes    Pain Score  4     Pain Location  Leg    Pain Orientation  Left    Pain Descriptors / Indicators  Aching    Pain Type  Chronic pain    Multiple Pain Sites  No      OT TREATMENT  Neuromuscular re-education:   Pt. worked on grasping, and positioning magnetic hooks on a whiteboard positioned at a vertical angle on an elevated surface using his left hand. Pt. worked on [facility]  skills grasping 1/2" flat washers, and placing them on the hooks. Pt demonstrated decreased [facility] skills when having to reach and keep arms sustained in elevation above head. Pt frequently dropped pegs and required x4 rest breaks secondary to UE fatigue.  Self-care/IADLs:   Pt. worked on Careers information officer when writing within a defined space. Pt used lined paper and a standard pen to write a list of items. Pt deviated slightly from the margin however was able to write between the lines. Pt demonstrated ~50% legibility writing in print form today. Pt. used a mature hand grasp on the pen. Pt. was able to hold the pen for the entire exercise today.     OT Education - 05/20/18 1401    Education provided  Yes    Education Details  Left hand [facility] skills and reaching    Person(s) Educated  Patient    Methods  Explanation;Demonstration;Verbal cues    Comprehension  Verbalized understanding;Verbal cues required;Returned demonstration          OT [place] Term Goals - 04/09/18 1458      OT [place] TERM GOAL #1   Title  Patient will demonstrate  holding utensils in her left hand and feeding herself with modified independence with minimal spillage.      Baseline  04/09/2018: Pt spills >50% of food when using L hand. Pt is able to cut soft meats while using her fork.    Time  8    Period  Weeks    Status  On-going    Target Date  06/04/18      OT [place] TERM GOAL #2   Title  Patient will complete tub/shower transfer with supervision only.    Baseline  04/09/2018 Pt requires min A to lift legs over tub for transfers    Time  8    Period  Weeks    Status  On-going    Target Date  06/04/18      OT H[place] TERM GOAL #4   Title  Patient will increase left UE strength by 1 mm grade to be able to reach into closet to retrieve clothing items.     Baseline  04/09/2018 - Pt LUE strength 4-/5. Pt can reach items in her closet independently    Time  12    Period  Weeks    Status  On-going    Target Date   07/02/18      OT [place] TERM GOAL #5   Title  Patient will improve coordination on the left to be able to hold pen securely and write her name with 75% legibility.     Baseline  04/09/2018 - Pt prints name with ~60% legibility using standard pen with a grip    Time  12    Period  Weeks    Status  On-going    Target Date  07/02/18      OT Nied TERM GOAL #6   Title  Patient will complete lower body dressing with modified independence using adaptive equipment if needed.     Baseline  04/09/2018 - Pt requires min A to don pants. Pt requires increased time to tie shoes independently.    Time  12    Period  Weeks    Status  On-going    Target Date  07/02/18            Plan - 05/20/18 1402    Clinical Impression Statement  Pt continues worked on LUE strength and [facility] skills. Pt focused on keeping arms sustained in elevation. Pt continues to make progress as she fatigues less quickly with arms above head. Pt worked on Diplomatic Services operational officer in the margins and lines using lined paper today. Pt to continue working to adjust writing size and line spacing to fit within a defined space. Pt to continue to work on these skills to promote independence during ADLs and IADLs.    Occupational Profile and client history currently impacting functional performance  patient is legally blind, progressive disease process with MS for over 20+ years, lives alone, limited availability for transportation.      Occupational performance deficits (Please refer to evaluation for details):  ADL's;Leisure    Rehab Potential  Good    OT Frequency  2x / week    OT Duration  12 weeks    OT Treatment/Interventions  Therapeutic exercise;Cognitive remediation/compensation;Functional Mobility Training    Plan  Positive:  motivation, family support.  Negative:  lives alone, legally blind, requires caregiver 7 days a week for a few hours a day for self care    Clinical Decision Making  Several treatment options, min-mod task modification  necessary  Consulted and Agree with Plan of Care  Patient       Patient will benefit from skilled therapeutic intervention in order to improve the following deficits and impairments:  Decreased cognition, Decreased knowledge of use of DME, Impaired vision/preception, Pain, Decreased coordination, Decreased activity tolerance, Decreased endurance, Decreased strength, Decreased balance, Impaired UE functional use  Visit Diagnosis: Other lack of coordination    Problem List Patient Active Problem List   Diagnosis Date Noted  . Lower extremity edema 04/15/2017  . Macrocytic anemia 02/11/2017  . Vitamin D deficiency 02/02/2016  . Mixed hyperlipidemia 01/03/2015  . MS (multiple sclerosis) ([facility]) 01/03/2015  . Chronic pain 01/03/2015  . GERD without esophagitis 01/03/2015  . COPD (chronic obstructive pulmonary disease) ([facility]) 01/03/2015  . Osteopenia 01/03/2015  . Neurogenic bladder 01/03/2015  . Incontinence in female 01/03/2015    Ernesto Rutherford, OTS 05/20/2018, 2:15 PM   Olegario Messier, MS, OTR/L   [place] [facility] MAIN [facility] SERVICES [address] [place], [place], [address] Phone: [phone]   Fax:  [phone]  Name: [name] MRN: [id number] Date of Birth: August 13, 1955

## 2018-05-26 ENCOUNTER — Ambulatory Visit: Payer: Medicare Other | Admitting: Occupational Therapy

## 2018-05-28 ENCOUNTER — Ambulatory Visit: Payer: Medicare Other | Admitting: Physical Therapy

## 2018-05-28 ENCOUNTER — Ambulatory Visit: Payer: Medicare Other | Attending: Neurology | Admitting: Occupational Therapy

## 2018-05-28 ENCOUNTER — Encounter: Payer: Self-pay | Admitting: Physical Therapy

## 2018-05-28 DIAGNOSIS — R262 Difficulty in walking, not elsewhere classified: Secondary | ICD-10-CM | POA: Insufficient documentation

## 2018-05-28 DIAGNOSIS — R278 Other lack of coordination: Secondary | ICD-10-CM

## 2018-05-28 DIAGNOSIS — M6281 Muscle weakness (generalized): Secondary | ICD-10-CM | POA: Diagnosis not present

## 2018-05-28 NOTE — Patient Instructions (Signed)
ABDUCTION: Sitting - Exercise Ball: Resistance Band (Active)   Sit with feet flat. With band tied around both legs, Lift right leg slightly and, against resistance band, draw it out to side. Complete __2_ sets of __10_ repetitions. Perform _2__ sessions per day.  Copyright  VHI. All rights reserved.  FLEXION: Sitting - Resistance Band (Active)   Sit, both feet flat. Have band tied around both legs above knees, lift right knee toward ceiling.Repeat with other knee Complete _2__ sets of _10__ repetitions. Perform _2__ sessions per day.  http://gtsc.exer.us/21   Knee Extension: Resisted (Sitting)   With band looped around right ankle and under other foot, straighten leg with ankle loop. Keep other leg bent to increase resistance. Repeat _10___ times per set. Do __2__ sets per session. Do _2___ sessions per day.  http://orth.exer.us/691   Copyright  VHI. All rights reserved.   Copyright  VHI. All rights reserved.  Copyright  VHI. All rights reserved.  FLEXION: Sitting - Resistance Band (Active)   Sit with right foot flat. Have band tied around both feet, bend ankle, bringing toes toward head. Complete __2_ sets of __10_ repetitions. Perform _2__ sessions per day.  Copyright  VHI. All rights reserved.  Toe / Heel Raise (Sitting)   Sitting, raise heels, then rock back on heels and raise toes. Repeat _10___ times.  Copyright  VHI. All rights reserved.  HIP / KNEE: Extension - Sit to Stand   Sitting, lean chest forward, raise hips up from surface. Straighten hips and knees. Weight bear equally on left and right sides. Backs of legs should not push off surface. __10_ reps per set, __2_ sets per day, _5__ days per week Use assistive device as needed.   

## 2018-05-28 NOTE — Therapy (Signed)
[place] MAIN [facility] SERVICES [address] [place], [place], [address] Phone: [phone]   Fax:  [phone]  Physical Therapy Treatment/Recert  Patient Details  Name: [name] MRN: [id number] Date of Birth: [birth year] Referring Provider (PT): Anabel Bene   Encounter Date: 05/28/2018  PT End of Session - 05/28/18 1249    Visit Number  32    Number of Visits  49    Date for PT Re-Evaluation  07/23/18    Authorization Type  2/10    PT Start Time  7793    PT Stop Time  1430    PT Time Calculation (min)  45 min    Equipment Utilized During Treatment  Gait belt    Activity Tolerance  Patient tolerated treatment well    Behavior During Therapy  WFL for tasks assessed/performed       Past Medical History:  Diagnosis Date  . Headache   . Hyperlipidemia   . MS (multiple sclerosis) ([place]) 1990  . Neurogenic bladder     Past Surgical History:  Procedure Laterality Date  . ABDOMINAL HYSTERECTOMY    . OOPHORECTOMY Bilateral     There were no vitals filed for this visit.  Subjective Assessment - 05/28/18 1248    Subjective  Patient reports doing well today; She reports that her left side still feels weak; She reports walking some at home as part of her HEP; She reports not doing any formal exercise because she doesn't have time and she doesn't know where her papers are.     Pertinent History  Patient reports about 3 months ago she started to feel weak in the left arm, reports she is legally blind.       How [place] can you stand comfortably?  not too [place]     Patient Stated Goals  Patient wants to be able to walk better.     Currently in Pain?  No/denies    Pain Onset  --    Multiple Pain Sites  No       TREATMENT: Warm up on Nustep BUE/BLE level 2 x3 min with cues to keep steps per minute >50 for better cardiovascular challenge and warm up;  Instructed patient in outcome measures to address goals:     Outcome Measure  04/01/18 05/13/18 05/28/18 Interpretation  5 Times Sit<>stand 17.04 18.06 18.59 sec with BUE HHA >15 sec indicates high fall risk; no change from last reassessments;   10 M Walk 1.0 m/s ---- 0.8 m/s with rollator Limited home ambulator; more impaired than last assessment on 04/01/18  6 min Walk 515 feet 605 feet 815 feet with rollator Limited community ambulator; improved from 605 feet on 05/13/18  Timed up and Go 15.9 sec 14.87 sec 18.8 sec with rollator >14 sec indicates high fall risk; more impaired than 05/13/18   Instructed patient in sitting HEP: Sitting with red tband around BLE: Hip flexion x15 bilaterally; -Hip abduction x15 bilaterallyt  -LAQ x15 bilaterally -ankle DF x15 bilaterally; Patient required min-moderate verbal/tactile cues for correct exercise technique including to increase ROM for better strengthening; Provided written HEP for better adherence;                       PT Education - 05/28/18 1248    Education Details  exercise technique, body mechanics, balance; HEP reinforced;     Person(s) Educated  Patient    Methods  Explanation;Demonstration;Verbal cues    Comprehension  Verbalized understanding;Returned demonstration;Verbal cues required;Need further instruction       PT Short Term Goals - 05/28/18 1416      PT SHORT TERM GOAL #1   Title  Patient will be independent in home exercise program to improve strength/mobility for better functional independence with ADLs.    Baseline  reports that she is not doing a home exercise program;     Time  4    Period  Weeks    Status  Not Met    Target Date  06/25/18      PT SHORT TERM GOAL #2   Title  Pt (< 36 years old) will complete five times sit to stand test in < 10 seconds indicating an increased LE strength and improved balance.    Baseline  02/11/18= 19.38; 03/16/18: 18.0s,  05/13/18=18.06 sec; 05/28/18: 18.59 sec    Time  4    Period  Weeks    Status  Not Met    Target Date  06/25/18         PT [place] Term Goals - 05/28/18 1417      PT Rhudy TERM GOAL #1   Title  Patient will increase six minute walk test distance to >1000 for progression to community ambulator and improve gait ability    Baseline  02/11/18= 500 feet; 03/16/18: 830 feet,  04/01/18= 515,05/13/18 605 ft; 05/28/18: 815 feet    Time  8    Period  Weeks    Status  Partially Met    Target Date  07/23/18      PT Gaubert TERM GOAL #2   Title  Patient will increase 10 meter walk test to >1.35ms as to improve gait speed for better community ambulation and to reduce fall risk.    Baseline  02/11/18 . 68 m/sec; 03/16/18: self-selected: 12.6s= 0.79 m/s fastest: 10.4s = 0.96 m/s, 04/01/18= .1.0 m/sec,05/13/18; 05/28/18: 0.89 m/s with rollator    Time  8    Period  Weeks    Status  Not Met    Target Date  07/23/18      PT Larr[place] TERM GOAL #3   Title  Patient will reduce timed up and go to <11 seconds to reduce fall risk and demonstrate improved transfer/gait ability.    Baseline  17.49 sec 02/11/18; 03/16/18: 16.8s, 04/01/18= 15.9 sec, 05/13/18 14.87 sec; 05/28/18: 18.8 sec    Time  8    Period  Weeks    Status  Not Met    Target Date  07/23/18      PT Handshoe TERM GOAL #4   Title  Patient (> 668years old) will complete five times sit to stand test in < 15 seconds indicating an increased LE strength and improved balance.    Baseline  19.38sec 02/11/18; 03/16/18: 18.0s, 04/01/18= 17.04 sec, 05/13/18= 18.06 sec    Time  8    Period  Weeks    Status  Partially Met    Target Date  07/23/18            Plan - 05/28/18 1421    Clinical Impression Statement  Patient instructed in outcome measures to address goals; She does not exhibit significant change in mobility with exception of 6 min walk test; Patient reports that she doesn't have a formal HEP and reports that she has not been working on exercises at home. PT concerned that lack of HEP adherence is why goals are not being met. Recommend patient reduce frequency to 1x  a week with  the expectation that treatment sessions will be to advance HEP for better functional gains. Patient agreeble. Advanced HEP with sitting tband exercise for LE strengthening;     PT Frequency  1x / week    PT Duration  8 weeks    PT Treatment/Interventions  Gait training;Therapeutic exercise;Therapeutic activities;Functional mobility training;Neuromuscular re-education;Patient/family education;Balance training    PT Next Visit Plan  strengthening, balance training    Consulted and Agree with Plan of Care  Patient       Patient will benefit from skilled therapeutic intervention in order to improve the following deficits and impairments:  Abnormal gait, Decreased balance, Decreased endurance, Postural dysfunction, Impaired flexibility, Decreased strength, Decreased safety awareness, Decreased activity tolerance, Decreased range of motion, Decreased knowledge of precautions, Impaired sensation, Difficulty walking  Visit Diagnosis: Other lack of coordination  Muscle weakness (generalized)  Difficulty in walking, not elsewhere classified     Problem List Patient Active Problem List   Diagnosis Date Noted  . Lower extremity edema 04/15/2017  . Macrocytic anemia 02/11/2017  . Vitamin D deficiency 02/02/2016  . Mixed hyperlipidemia 01/03/2015  . MS (multiple sclerosis) ([place]) 01/03/2015  . Chronic pain 01/03/2015  . GERD without esophagitis 01/03/2015  . COPD (chronic obstructive pulmonary disease) ([place]) 01/03/2015  . Osteopenia 01/03/2015  . Neurogenic bladder 01/03/2015  . Incontinence in female 01/03/2015    Christel Bai PT, DPT 05/28/2018, 2:27 PM  [place] MAIN [facility] SERVICES [address] [place], [place], [address] Phone: [phone]   Fax:  [phone]  Name: IARA MONDS MRN: [id number] Date of Birth: 24-Aug-1955

## 2018-05-28 NOTE — Therapy (Signed)
[facility] MAIN [facility] SERVICES [address] [place], [place], [address] Phone: [phone]   Fax:  [phone]  Occupational Therapy Treatment/10th visit Progress Update Reporting Period from 04/14/2018 to 05/28/2018  Patient Details  Name: [name] MRN: [id number] Date of Birth: 03-17-1956 No data recorded  Encounter Date: 05/28/2018  OT End of Session - 05/28/18 1337    Visit Number  48    Number of Visits  72    Date for OT Re-Evaluation  07/02/17    Authorization Type  Visit 10 of 10 for progress report starting 04/14/2018    OT Start Time  1300       Past Medical History:  Diagnosis Date  . Headache   . Hyperlipidemia   . MS (multiple sclerosis) (HCC) 1990  . Neurogenic bladder     Past Surgical History:  Procedure Laterality Date  . ABDOMINAL HYSTERECTOMY    . OOPHORECTOMY Bilateral     There were no vitals filed for this visit.  Subjective Assessment - 05/28/18 1336    Subjective   Patient reports she had a good Thanksgiving and spent time with her son.  States she has continued to work on Chiropractor at home but still feels it needs work.      Pertinent History  Patient reports she was diagnosed with MS years ago and has recently started to feel weak in her left dominant arm and hand.  She denies any previous therapy and reports memory issues and can provide a limited history.     Patient Stated Goals  Patient reports she wants to be able to walk better, use her left hand and be able to write again.     Currently in Pain?  Yes    Pain Score  4     Pain Location  Leg    Pain Orientation  Left    Pain Descriptors / Indicators  Aching          Patient seen for update of goals and progress this date.  Patient seen for handwriting for name in print and script, names of family members, legibility 70% using regular pen this date and lined paper.   Patient seen for left hand finger strengthening with push pins and  moderate resistive board to place and remove, mild difficulty to place pins Into the board, decreased difficulty pulling them out.  Completed 37 pins this date.   Patient seen for unknotting with medium nylon rope, multiple knots performed with use of bilateral UE with cues for left hand to lead task.                  OT Education - 05/28/18 1337    Education provided  Yes    Education Details  left hand strength and coordination, handwriting.    Person(s) Educated  Patient    Methods  Explanation;Demonstration;Verbal cues    Comprehension  Verbalized understanding;Verbal cues required;Returned demonstration          OT Thiam Term Goals - 05/28/18 1307      OT Steffek TERM GOAL #1   Title  Patient will demonstrate holding utensils in her left hand and feeding herself with modified independence with minimal spillage.      Baseline  04/09/2018: Pt spills >50% of food when using L hand. Pt is able to cut soft meats while using her fork.    Time  8    Period  Weeks    Status  Achieved      OT Zwiebel TERM GOAL #2   Title  Patient will complete tub/shower transfer with supervision only.    Baseline  04/09/2018 Pt requires min A to lift legs over tub for transfers    Time  8    Period  Weeks    Status  On-going      OT Seyller TERM GOAL #4   Title  Patient will increase left UE strength by 1 mm grade to be able to reach into closet to retrieve clothing items.     Baseline  04/09/2018 - Pt LUE strength 4-/5. Pt can reach items in her closet independently    Time  12    Period  Weeks    Status  On-going      OT Ortega TERM GOAL #5   Title  Patient will improve coordination on the left to be able to hold pen securely and write her name with 75% legibility.     Baseline  04/09/2018 - Pt prints name with ~60% legibility using standard pen with a grip    Time  12    Period  Weeks    Status  On-going      OT Funderburg TERM GOAL #6   Title  Patient will complete lower body dressing  with modified independence using adaptive equipment if needed.     Baseline  04/09/2018 - Pt requires min A to don pants. Pt requires increased time to tie shoes independently. 05/28/18 modified independent    Time  12    Period  Weeks    Status  Achieved            Plan - 05/28/18 1339    Clinical Impression Statement  Updated goals this date, patient has progressed with being able to perform lower body dressing to modified independence and reports she was able to tie her shoes today by herself.  She is now able to get her clothing from the closet at times but needs to work on being able to do this consistently.  She is able to feed herself with little to no spillage during meals.  She continues to demonstrate decreased legibility with handwriting, She continues to require assist with tub/shower transfers.  She reports being pleased with her progress so far.  She continues to benefit from skilled OT to maximize safety and independence in daily tasks.      Occupational Profile and client history currently impacting functional performance  patient is legally blind, progressive disease process with MS for over 20+ years, lives alone, limited availability for transportation.      Occupational performance deficits (Please refer to evaluation for details):  ADL's;Leisure    Rehab Potential  Good    OT Frequency  2x / week    OT Duration  12 weeks    Plan  Positive:  motivation, family support.  Negative:  lives alone, legally blind, requires caregiver 7 days a week for a few hours a day for self care    Consulted and Agree with Plan of Care  Patient       Patient will benefit from skilled therapeutic intervention in order to improve the following deficits and impairments:  Decreased cognition, Decreased knowledge of use of DME, Impaired vision/preception, Pain, Decreased coordination, Decreased activity tolerance, Decreased endurance, Decreased strength, Decreased balance, Impaired UE functional  use  Visit Diagnosis: Other lack of coordination  Muscle weakness (generalized)    Problem List Patient Active Problem List  Diagnosis Date Noted  . Lower extremity edema 04/15/2017  . Macrocytic anemia 02/11/2017  . Vitamin D deficiency 02/02/2016  . Mixed hyperlipidemia 01/03/2015  . MS (multiple sclerosis) (HCC) 01/03/2015  . Chronic pain 01/03/2015  . GERD without esophagitis 01/03/2015  . COPD (chronic obstructive pulmonary disease) (HCC) 01/03/2015  . Osteopenia 01/03/2015  . Neurogenic bladder 01/03/2015  . Incontinence in female 01/03/2015   Amy T Arne Cleveland, OTR/L, CLT  Lovett,Amy 05/28/2018, 2:10 PM  [place] [facility] MAIN [facility] SERVICES [address] [place], [place], [address] Phone: [phone]   Fax:  [phone]  Name: [name] MRN: [id number] Date of Birth: February 01, 1956

## 2018-06-02 ENCOUNTER — Ambulatory Visit: Payer: Medicare Other | Admitting: Occupational Therapy

## 2018-06-02 ENCOUNTER — Ambulatory Visit: Payer: Medicare Other

## 2018-06-02 DIAGNOSIS — R278 Other lack of coordination: Secondary | ICD-10-CM

## 2018-06-02 DIAGNOSIS — M6281 Muscle weakness (generalized): Secondary | ICD-10-CM

## 2018-06-02 DIAGNOSIS — R262 Difficulty in walking, not elsewhere classified: Secondary | ICD-10-CM | POA: Diagnosis not present

## 2018-06-02 NOTE — Therapy (Signed)
[place] MAIN [facility] SERVICES [address] [place], [place], [address] Phone: [phone]   Fax:  [phone]  Physical Therapy Treatment  Patient Details  Name: [name] MRN: [id number] Date of Birth: 06/17/1956 Referring Provider (PT): Anabel Bene   Encounter Date: 06/02/2018  PT End of Session - 06/02/18 1146    Visit Number  33    Number of Visits  49    Date for PT Re-Evaluation  07/23/18    Authorization Type  2/10    PT Start Time  1045    PT Stop Time  1125    PT Time Calculation (min)  40 min    Equipment Utilized During Treatment  Gait belt    Activity Tolerance  Patient tolerated treatment well    Behavior During Therapy  WFL for tasks assessed/performed       Past Medical History:  Diagnosis Date  . Headache   . Hyperlipidemia   . MS (multiple sclerosis) ([place]) 1990  . Neurogenic bladder     Past Surgical History:  Procedure Laterality Date  . ABDOMINAL HYSTERECTOMY    . OOPHORECTOMY Bilateral     There were no vitals filed for this visit.  Subjective Assessment - 06/02/18 1048    Subjective  Pt reports feeling ok today. She conitnues to do some of her HEP but needs help from hr aid to perform.     Pertinent History  Patient reports about 3 months ago she started to feel weak in the left arm, reports she is legally blind.         Therex/Therapeutic Activity -Seated ankle DF/PF 1x15 each  -STS from chair + airex 2x10, VC for hip hinge and trunk excursion -Seated deadlifts to floor 2x10, VC for wide knees/feet -Prone Straight leg raise 1x10 bilat with pillow under waist -Quadruped shoulder taps 1x12 bilat -Prone Extension (hip flexor stretch) 3x90seconds -Quadruped Fire hydrants: 1x6 alternating bilat  *adequate rest provided as needed, as patient fatigues quickly      PT Short Term Goals - 05/28/18 1416      PT SHORT TERM GOAL #1   Title  Patient will be independent in home exercise program  to improve strength/mobility for better functional independence with ADLs.    Baseline  reports that she is not doing a home exercise program;     Time  4    Period  Weeks    Status  Not Met    Target Date  06/25/18      PT SHORT TERM GOAL #2   Title  Pt (< 109 years old) will complete five times sit to stand test in < 10 seconds indicating an increased LE strength and improved balance.    Baseline  02/11/18= 19.38; 03/16/18: 18.0s,  05/13/18=18.06 sec; 05/28/18: 18.59 sec    Time  4    Period  Weeks    Status  Not Met    Target Date  06/25/18        PT Encarnacion Term Goals - 05/28/18 1417      PT Valente TERM GOAL #1   Title  Patient will increase six minute walk test distance to >1000 for progression to community ambulator and improve gait ability    Baseline  02/11/18= 500 feet; 03/16/18: 830 feet,  04/01/18= 515,05/13/18 605 ft; 05/28/18: 815 feet    Time  8    Period  Weeks    Status  Partially Met  Target Date  07/23/18      PT Koors TERM GOAL #2   Title  Patient will increase 10 meter walk test to >1.59ms as to improve gait speed for better community ambulation and to reduce fall risk.    Baseline  02/11/18 . 68 m/sec; 03/16/18: self-selected: 12.6s= 0.79 m/s fastest: 10.4s = 0.96 m/s, 04/01/18= .1.0 m/sec,05/13/18; 05/28/18: 0.89 m/s with rollator    Time  8    Period  Weeks    Status  Not Met    Target Date  07/23/18      PT Chicoine TERM GOAL #3   Title  Patient will reduce timed up and go to <11 seconds to reduce fall risk and demonstrate improved transfer/gait ability.    Baseline  17.49 sec 02/11/18; 03/16/18: 16.8s, 04/01/18= 15.9 sec, 05/13/18 14.87 sec; 05/28/18: 18.8 sec    Time  8    Period  Weeks    Status  Not Met    Target Date  07/23/18      PT Lomas TERM GOAL #4   Title  Patient (> 639years old) will complete five times sit to stand test in < 15 seconds indicating an increased LE strength and improved balance.    Baseline  19.38sec 02/11/18; 03/16/18: 18.0s, 04/01/18= 17.04 sec,  05/13/18= 18.06 sec    Time  8    Period  Weeks    Status  Partially Met    Target Date  07/23/18            Plan - 06/02/18 1147    Clinical Impression Statement  Current and future HEP activiites performed and modified as needed for appropriate level of independence and intensity. Pt issued an updated HEP for performance with caregiver, and a second which is deemed safe for independent performance alone. Pt does well with educational interventions for modification of set up, but does endorse someconcerns about her memory limitations. Pt given large text print out of instruction as she has visual impairments and difficulty reading which would otherwise limit her capacity to be independent. Over pt progressign well. Standing balance appears to be improving largely.     Rehab Potential  Good    PT Frequency  1x / week    PT Duration  8 weeks    PT Treatment/Interventions  Gait training;Therapeutic exercise;Therapeutic activities;Functional mobility training;Neuromuscular re-education;Patient/family education;Balance training    PT Next Visit Plan  Review recent HEP, make sure its working out well.     PT Home Exercise Plan  see atached document     Consulted and Agree with Plan of Care  Patient       Patient will benefit from skilled therapeutic intervention in order to improve the following deficits and impairments:  Abnormal gait, Decreased balance, Decreased endurance, Postural dysfunction, Impaired flexibility, Decreased strength, Decreased safety awareness, Decreased activity tolerance, Decreased range of motion, Decreased knowledge of precautions, Impaired sensation, Difficulty walking  Visit Diagnosis: Muscle weakness (generalized)  Other lack of coordination  Difficulty in walking, not elsewhere classified     Problem List Patient Active Problem List   Diagnosis Date Noted  . Lower extremity edema 04/15/2017  . Macrocytic anemia 02/11/2017  . Vitamin D deficiency  02/02/2016  . Mixed hyperlipidemia 01/03/2015  . MS (multiple sclerosis) ([place] 01/03/2015  . Chronic pain 01/03/2015  . GERD without esophagitis 01/03/2015  . COPD (chronic obstructive pulmonary disease) ([place] 01/03/2015  . Osteopenia 01/03/2015  . Neurogenic bladder 01/03/2015  . Incontinence in female 01/03/2015  11:59 AM, 06/02/18 Etta Grandchild, PT, DPT Physical Therapist - [place] [phone]     Etta Grandchild 06/02/2018, 11:54 AM  [place] MAIN [facility] SERVICES [address] [place], [place], [address] Phone: [phone]   Fax:  [phone]  Name: NEVEYAH GARZON MRN: [id number] Date of Birth: 1956-03-12

## 2018-06-02 NOTE — Therapy (Signed)
[place] [facility] MAIN [facility] SERVICES [address] [place], [place], [address] Phone: [phone]   Fax:  [phone]  Occupational Therapy Treatment  Patient Details  Name: [name] MRN: [id number] Date of Birth: [birth year] No data recorded  Encounter Date: 06/02/2018  OT End of Session - 06/02/18 1135    Visit Number  49    Number of Visits  72    Date for OT Re-Evaluation  07/02/17    Authorization Type  Visit 1 of 10 for progress report staring 06/02/2018    OT Start Time  1125    OT Stop Time  1200    OT Time Calculation (min)  35 min    Equipment Utilized During Treatment  magnifying glass    Activity Tolerance  Patient tolerated treatment well    Behavior During Therapy  [facility] for tasks assessed/performed       Past Medical History:  Diagnosis Date  . Headache   . Hyperlipidemia   . MS (multiple sclerosis) (HCC) 1990  . Neurogenic bladder     Past Surgical History:  Procedure Laterality Date  . ABDOMINAL HYSTERECTOMY    . OOPHORECTOMY Bilateral     There were no vitals filed for this visit.  Subjective Assessment - 06/02/18 1128    Subjective   Pt. reports that she is waiting for help from her caregiver to put her Christmas up.    Pertinent History  Patient reports she was diagnosed with MS years ago and has recently started to feel weak in her left dominant arm and hand.  She denies any previous therapy and reports memory issues and can provide a limited history.     Patient Stated Goals  Patient reports she wants to be able to walk better, use her left hand and be able to write again.     Currently in Pain?  Yes    Pain Score  4     Pain Orientation  Left   Whole left side   Pain Descriptors / Indicators  Aching    Pain Type  Chronic pain      OT TREATMENT    Selfcare:  Pt. worked on Pensions consultant. Pt. formulated words in printed form with 75% when printing with large letter size. Pt. initially  started with unlined paper, and changed to lined paper with improved spacing.                        OT Education - 06/02/18 1135    Education provided  Yes    Education Details  left hand strength and coordination, handwriting.    Person(s) Educated  Patient    Methods  Explanation;Demonstration;Verbal cues    Comprehension  Verbalized understanding;Verbal cues required;Returned demonstration          OT Knaus Term Goals - 05/28/18 1307      OT Kading TERM GOAL #1   Title  Patient will demonstrate holding utensils in her left hand and feeding herself with modified independence with minimal spillage.      Baseline  04/09/2018: Pt spills >50% of food when using L hand. Pt is able to cut soft meats while using her fork.    Time  8    Period  Weeks    Status  Achieved      OT Strauss TERM GOAL #2   Title  Patient will complete tub/shower transfer with supervision only.    Baseline  04/09/2018 Pt requires min A to lift legs over tub for transfers    Time  8    Period  Weeks    Status  On-going      OT Vink TERM GOAL #4   Title  Patient will increase left UE strength by 1 mm grade to be able to reach into closet to retrieve clothing items.     Baseline  04/09/2018 - Pt LUE strength 4-/5. Pt can reach items in her closet independently    Time  12    Period  Weeks    Status  On-going      OT Held TERM GOAL #5   Title  Patient will improve coordination on the left to be able to hold pen securely and write her name with 75% legibility.     Baseline  04/09/2018 - Pt prints name with ~60% legibility using standard pen with a grip    Time  12    Period  Weeks    Status  On-going      OT Callaham TERM GOAL #6   Title  Patient will complete lower body dressing with modified independence using adaptive equipment if needed.     Baseline  04/09/2018 - Pt requires min A to don pants. Pt requires increased time to tie shoes independently. 05/28/18 modified independent    Time   12    Period  Weeks    Status  Achieved            Plan - 06/02/18 1135    Clinical Impression Statement Pt. continues to present with limited LUE strength, and [facility] skills which affects her ability to write legibly, or eeficiently. Pt. continues to work on improving UE strength, and [facility] skills in order to improve engagement in ADLs, and IADL tasks safely, and, promote overall independence.     Occupational Profile and client history currently impacting functional performance  patient is legally blind, progressive disease process with MS for over 20+ years, lives alone, limited availability for transportation.      Occupational performance deficits (Please refer to evaluation for details):  ADL's;Leisure    Rehab Potential  Good    OT Frequency  2x / week    OT Duration  12 weeks    OT Treatment/Interventions  Therapeutic exercise;Cognitive remediation/compensation;Functional Mobility Training    Plan  Positive:  motivation, family support.  Negative:  lives alone, legally blind, requires caregiver 7 days a week for a few hours a day for self care    Clinical Decision Making  Several treatment options, min-mod task modification necessary    Consulted and Agree with Plan of Care  Patient       Patient will benefit from skilled therapeutic intervention in order to improve the following deficits and impairments:  Decreased cognition, Decreased knowledge of use of DME, Impaired vision/preception, Pain, Decreased coordination, Decreased activity tolerance, Decreased endurance, Decreased strength, Decreased balance, Impaired UE functional use  Visit Diagnosis: Muscle weakness (generalized)  Other lack of coordination    Problem List Patient Active Problem List   Diagnosis Date Noted  . Lower extremity edema 04/15/2017  . Macrocytic anemia 02/11/2017  . Vitamin D deficiency 02/02/2016  . Mixed hyperlipidemia 01/03/2015  . MS (multiple sclerosis) (HCC) 01/03/2015  . Chronic pain  01/03/2015  . GERD without esophagitis 01/03/2015  . COPD (chronic obstructive pulmonary disease) (HCC) 01/03/2015  . Osteopenia 01/03/2015  . Neurogenic bladder 01/03/2015  . Incontinence in female 01/03/2015  Olegario Messier, MS, OTR/L 06/02/2018, 11:40 AM  [place] [facility] MAIN [facility] SERVICES [address] [place], [place], [address] Phone: [phone]   Fax:  [phone]  Name: [name] MRN: [id number] Date of Birth: [birth year]

## 2018-06-04 ENCOUNTER — Ambulatory Visit: Payer: Medicare Other | Admitting: Physical Therapy

## 2018-06-04 ENCOUNTER — Ambulatory Visit: Payer: Medicare Other | Admitting: Occupational Therapy

## 2018-06-04 ENCOUNTER — Encounter: Payer: Self-pay | Admitting: Occupational Therapy

## 2018-06-04 DIAGNOSIS — M6281 Muscle weakness (generalized): Secondary | ICD-10-CM

## 2018-06-04 DIAGNOSIS — R262 Difficulty in walking, not elsewhere classified: Secondary | ICD-10-CM | POA: Diagnosis not present

## 2018-06-04 DIAGNOSIS — R278 Other lack of coordination: Secondary | ICD-10-CM | POA: Diagnosis not present

## 2018-06-04 NOTE — Therapy (Addendum)
[place] [facility] MAIN [facility] SERVICES [address] [place], [place], [address] Phone: [phone]   Fax:  [phone]  Occupational Therapy Treatment  Patient Details  Name: [name] MRN: [id number] Date of Birth: July 20, 1955 No data recorded  Encounter Date: 06/04/2018  OT End of Session - 06/04/18 1114    Visit Number  50    Number of Visits  72    Date for OT Re-Evaluation  07/02/17    Authorization Type  Visit 2 of 10 for progress report staring 06/02/2018    OT Start Time  1105    OT Stop Time  1145    OT Time Calculation (min)  40 min    Activity Tolerance  Patient tolerated treatment well    Behavior During Therapy  [facility] for tasks assessed/performed       Past Medical History:  Diagnosis Date  . Headache   . Hyperlipidemia   . MS (multiple sclerosis) (HCC) 1990  . Neurogenic bladder     Past Surgical History:  Procedure Laterality Date  . ABDOMINAL HYSTERECTOMY    . OOPHORECTOMY Bilateral      OT TREATMENT    Neuro muscular re-education:  Pt. worked on [facility] grasping 1/2" small magnetic pegs, disconnecting them, and placing them on a positioned at a vertical angle at a tabletop, on an elevated angle to encourage [facility] skills with arms sustained in elevation. Pt. worked on removing the pegs while alternating thumb opposition to the tip of the 2nd through 5th digits. Pt. worked on grasping heavy duty resistive magnets of varying sizes. Pt. worked on placing them on a whiteboard positioned at a vertical angle on the tabletop, on an elevated surface to encourage [facility] while UEs are elevated. Pt. worked on Human resources officer with resistance. Pt. Requires cues for sorting the pegs, and magnets by color into vertical columns. Pt. Worked on removing each item alternating thumb opposition to the tip of each finger.                           OT Education - 06/04/18 1114    Education provided  Yes    Education  Details  Left hand [facility] skills    Person(s) Educated  Patient    Methods  Explanation;Demonstration;Verbal cues    Comprehension  Verbalized understanding;Verbal cues required;Returned demonstration          OT [place] Term Goals - 05/28/18 1307      OT Wolanski TERM GOAL #1   Title  Patient will demonstrate holding utensils in her left hand and feeding herself with modified independence with minimal spillage.      Baseline  04/09/2018: Pt spills >50% of food when using L hand. Pt is able to cut soft meats while using her fork.    Time  8    Period  Weeks    Status  Achieved      OT Brucker TERM GOAL #2   Title  Patient will complete tub/shower transfer with supervision only.    Baseline  04/09/2018 Pt requires min A to lift legs over tub for transfers    Time  8    Period  Weeks    Status  On-going      OT Valli TERM GOAL #4   Title  Patient will increase left UE strength by 1 mm grade to be able to reach into closet to retrieve clothing items.  Baseline  04/09/2018 - Pt LUE strength 4-/5. Pt can reach items in her closet independently    Time  12    Period  Weeks    Status  On-going      OT Dunlevy TERM GOAL #5   Title  Patient will improve coordination on the left to be able to hold pen securely and write her name with 75% legibility.     Baseline  04/09/2018 - Pt prints name with ~60% legibility using standard pen with a grip    Time  12    Period  Weeks    Status  On-going      OT Devera TERM GOAL #6   Title  Patient will complete lower body dressing with modified independence using adaptive equipment if needed.     Baseline  04/09/2018 - Pt requires min A to don pants. Pt requires increased time to tie shoes independently. 05/28/18 modified independent    Time  12    Period  Weeks    Status  Achieved            Plan - 06/04/18 1115    Clinical Impression Statement Pt. left her glasses case at therapy the last session, and was so thankful to get them back. Pt. presents  with increased shaking in her left hand this date. Pt. Having had to sign an X for her name when checking in today.  Pt. continues to work on improving LUE functioning in order to improve ADL, and IADL skills.     Occupational Profile and client history currently impacting functional performance  patient is legally blind, progressive disease process with MS for over 20+ years, lives alone, limited availability for transportation.      Occupational performance deficits (Please refer to evaluation for details):  ADL's;Leisure    Rehab Potential  Good    OT Frequency  2x / week    OT Duration  12 weeks    OT Treatment/Interventions  Therapeutic exercise;Cognitive remediation/compensation;Functional Mobility Training    Plan  Positive:  motivation, family support.  Negative:  lives alone, legally blind, requires caregiver 7 days a week for a few hours a day for self care    Clinical Decision Making  Several treatment options, min-mod task modification necessary    Consulted and Agree with Plan of Care  Patient       Patient will benefit from skilled therapeutic intervention in order to improve the following deficits and impairments:  Decreased cognition, Decreased knowledge of use of DME, Impaired vision/preception, Pain, Decreased coordination, Decreased activity tolerance, Decreased endurance, Decreased strength, Decreased balance, Impaired UE functional use  Visit Diagnosis: Muscle weakness (generalized)    Problem List Patient Active Problem List   Diagnosis Date Noted  . Lower extremity edema 04/15/2017  . Macrocytic anemia 02/11/2017  . Vitamin D deficiency 02/02/2016  . Mixed hyperlipidemia 01/03/2015  . MS (multiple sclerosis) (HCC) 01/03/2015  . Chronic pain 01/03/2015  . GERD without esophagitis 01/03/2015  . COPD (chronic obstructive pulmonary disease) (HCC) 01/03/2015  . Osteopenia 01/03/2015  . Neurogenic bladder 01/03/2015  . Incontinence in female 01/03/2015    Olegario Messier, MS, OTR/L 06/04/2018, 11:29 AM  [facility] MAIN [facility] SERVICES [address] [place], [place], [address] Phone: [phone]   Fax:  [phone]  Name: [name] MRN: [id number] Date of Birth: 10-Jul-1955

## 2018-06-04 NOTE — Therapy (Signed)
[place] MAIN [facility] SERVICES [address] [place], [place], [address] Phone: [phone]   Fax:  [phone]  Physical Therapy Treatment  Patient Details  Name: [name] MRN: [id number] Date of Birth: 05-Oct-1955 Referring Provider (PT): Anabel Bene   Encounter Date: 06/04/2018  Patient arrived but her frequency had been decreased to 1 x / week.Plan of care discussed with patient and no treatment given.  Past Medical History:  Diagnosis Date  . Headache   . Hyperlipidemia   . MS (multiple sclerosis) ([place]) 1990  . Neurogenic bladder     Past Surgical History:  Procedure Laterality Date  . ABDOMINAL HYSTERECTOMY    . OOPHORECTOMY Bilateral     There were no vitals filed for this visit.                              PT Short Term Goals - 05/28/18 1416      PT SHORT TERM GOAL #1   Title  Patient will be independent in home exercise program to improve strength/mobility for better functional independence with ADLs.    Baseline  reports that she is not doing a home exercise program;     Time  4    Period  Weeks    Status  Not Met    Target Date  06/25/18      PT SHORT TERM GOAL #2   Title  Pt (< 49 years old) will complete five times sit to stand test in < 10 seconds indicating an increased LE strength and improved balance.    Baseline  02/11/18= 19.38; 03/16/18: 18.0s,  05/13/18=18.06 sec; 05/28/18: 18.59 sec    Time  4    Period  Weeks    Status  Not Met    Target Date  06/25/18        PT [place] Term Goals - 05/28/18 1417      PT Prestigiacomo TERM GOAL #1   Title  Patient will increase six minute walk test distance to >1000 for progression to community ambulator and improve gait ability    Baseline  02/11/18= 500 feet; 03/16/18: 830 feet,  04/01/18= 515,05/13/18 605 ft; 05/28/18: 815 feet    Time  8    Period  Weeks    Status  Partially Met    Target Date  07/23/18      PT Carl TERM GOAL #2   Title  Patient will increase 10 meter walk test to >1.29ms as to improve gait speed for better community ambulation and to reduce fall risk.    Baseline  02/11/18 . 68 m/sec; 03/16/18: self-selected: 12.6s= 0.79 m/s fastest: 10.4s = 0.96 m/s, 04/01/18= .1.0 m/sec,05/13/18; 05/28/18: 0.89 m/s with rollator    Time  8    Period  Weeks    Status  Not Met    Target Date  07/23/18      PT Petraitis TERM GOAL #3   Title  Patient will reduce timed up and go to <11 seconds to reduce fall risk and demonstrate improved transfer/gait ability.    Baseline  17.49 sec 02/11/18; 03/16/18: 16.8s, 04/01/18= 15.9 sec, 05/13/18 14.87 sec; 05/28/18: 18.8 sec    Time  8    Period  Weeks    Status  Not Met    Target Date  07/23/18      PT Osterman TERM GOAL #4   Title  Patient (> 60  years old) will complete five times sit to stand test in < 15 seconds indicating an increased LE strength and improved balance.    Baseline  19.38sec 02/11/18; 03/16/18: 18.0s, 04/01/18= 17.04 sec, 05/13/18= 18.06 sec    Time  8    Period  Weeks    Status  Partially Met    Target Date  07/23/18              Patient will benefit from skilled therapeutic intervention in order to improve the following deficits and impairments:     Visit Diagnosis: Muscle weakness (generalized)  Other lack of coordination  Difficulty in walking, not elsewhere classified     Problem List Patient Active Problem List   Diagnosis Date Noted  . Lower extremity edema 04/15/2017  . Macrocytic anemia 02/11/2017  . Vitamin D deficiency 02/02/2016  . Mixed hyperlipidemia 01/03/2015  . MS (multiple sclerosis) ([place]) 01/03/2015  . Chronic pain 01/03/2015  . GERD without esophagitis 01/03/2015  . COPD (chronic obstructive pulmonary disease) ([place]) 01/03/2015  . Osteopenia 01/03/2015  . Neurogenic bladder 01/03/2015  . Incontinence in female 01/03/2015    Alanson Puls, PT DPT 06/04/2018, 5:25 PM  [place]  MAIN [facility] SERVICES [address] [place], [place], [address] Phone: [phone]   Fax:  [phone]  Name: HOLLIE WOJAHN MRN: [id number] Date of Birth: 10-30-1955

## 2018-06-09 ENCOUNTER — Ambulatory Visit: Payer: Medicare Other | Admitting: Occupational Therapy

## 2018-06-09 ENCOUNTER — Encounter: Payer: Self-pay | Admitting: Occupational Therapy

## 2018-06-09 ENCOUNTER — Ambulatory Visit: Payer: Medicare Other | Admitting: Physical Therapy

## 2018-06-09 ENCOUNTER — Encounter: Payer: Self-pay | Admitting: Physical Therapy

## 2018-06-09 DIAGNOSIS — M6281 Muscle weakness (generalized): Secondary | ICD-10-CM

## 2018-06-09 DIAGNOSIS — R278 Other lack of coordination: Secondary | ICD-10-CM

## 2018-06-09 DIAGNOSIS — R262 Difficulty in walking, not elsewhere classified: Secondary | ICD-10-CM

## 2018-06-09 NOTE — Therapy (Signed)
[place] MAIN [facility] SERVICES [address] [place], [place], [address] Phone: [phone]   Fax:  [phone]  Physical Therapy Treatment  Patient Details  Name: [name] MRN: [id number] Date of Birth: Mar 14, 1956 Referring Provider (PT): Anabel Bene   Encounter Date: 06/09/2018  PT End of Session - 06/09/18 1222    Visit Number  34    Number of Visits  49    Date for PT Re-Evaluation  07/23/18    Authorization Type  3/10    PT Start Time  1145    PT Stop Time  1225    PT Time Calculation (min)  40 min    Equipment Utilized During Treatment  Gait belt    Activity Tolerance  Patient tolerated treatment well    Behavior During Therapy  WFL for tasks assessed/performed       Past Medical History:  Diagnosis Date  . Headache   . Hyperlipidemia   . MS (multiple sclerosis) ([place]) 1990  . Neurogenic bladder     Past Surgical History:  Procedure Laterality Date  . ABDOMINAL HYSTERECTOMY    . OOPHORECTOMY Bilateral     There were no vitals filed for this visit.  Subjective Assessment - 06/09/18 1221    Subjective  Pt reports feeling ok today. She conitnues to do some of her HEP but needs help from hr aid to perform.     Pertinent History  Patient reports about 3 months ago she started to feel weak in the left arm, reports she is legally blind.       How [place] can you stand comfortably?  not too Muff     Patient Stated Goals  Patient wants to be able to walk better.     Currently in Pain?  No/denies    Pain Score  0-No pain    Pain Onset  More than a month ago       TREATMENT  Ther-ex  Standing hip abd with 2.5# ankle weight x 25 BLE and BUE support  Standing hip extension with 2.5# ankle weight x 20 BLE with BUE support  Standing hip flexion marches with 2.5# ankle weight x 20 BLE with BUE support  Standing HS curls with 2.5# ankle weight x 20 BLE with BUE support  Standing heel raises x 20 with 2.5# ankle weights   LAQ with 2.5# ankle weights with 3 second holds x20 each LE   STS from chair without UE support 2x10  Hooklying abd/ER with GTB x 20 Bridging x 15 x 2 sidelying hip abd x 15 x 2  Prone <> quadriped x 10  SAQ with 5 lbs x 15 with 3 sec hold hooklying marching with 5 lbs x 15  CGA and mod verbal cues used throughout Patient performs intermediate level exercises without pain behaviors and needs verbal cuing for correct technique.                      PT Education - 06/09/18 1221    Education Details  exercise technique, body mechanics, balance; HEP reinforced;    Person(s) Educated  Patient    Methods  Explanation    Comprehension  Verbalized understanding       PT Short Term Goals - 05/28/18 1416      PT SHORT TERM GOAL #1   Title  Patient will be independent in home exercise program to improve strength/mobility for better functional independence with ADLs.  Baseline  reports that she is not doing a home exercise program;     Time  4    Period  Weeks    Status  Not Met    Target Date  06/25/18      PT SHORT TERM GOAL #2   Title  Pt (< 61 years old) will complete five times sit to stand test in < 10 seconds indicating an increased LE strength and improved balance.    Baseline  02/11/18= 19.38; 03/16/18: 18.0s,  05/13/18=18.06 sec; 05/28/18: 18.59 sec    Time  4    Period  Weeks    Status  Not Met    Target Date  06/25/18        PT Veale Term Goals - 05/28/18 1417      PT Detjen TERM GOAL #1   Title  Patient will increase six minute walk test distance to >1000 for progression to community ambulator and improve gait ability    Baseline  02/11/18= 500 feet; 03/16/18: 830 feet,  04/01/18= 515,05/13/18 605 ft; 05/28/18: 815 feet    Time  8    Period  Weeks    Status  Partially Met    Target Date  07/23/18      PT Kochanski TERM GOAL #2   Title  Patient will increase 10 meter walk test to >1.65ms as to improve gait speed for better community ambulation and to reduce  fall risk.    Baseline  02/11/18 . 68 m/sec; 03/16/18: self-selected: 12.6s= 0.79 m/s fastest: 10.4s = 0.96 m/s, 04/01/18= .1.0 m/sec,05/13/18; 05/28/18: 0.89 m/s with rollator    Time  8    Period  Weeks    Status  Not Met    Target Date  07/23/18      PT Dovers[place] TERM GOAL #3   Title  Patient will reduce timed up and go to <11 seconds to reduce fall risk and demonstrate improved transfer/gait ability.    Baseline  17.49 sec 02/11/18; 03/16/18: 16.8s, 04/01/18= 15.9 sec, 05/13/18 14.87 sec; 05/28/18: 18.8 sec    Time  8    Period  Weeks    Status  Not Met    Target Date  07/23/18      PT Davtyan TERM GOAL #4   Title  Patient (> 61years old) will complete five times sit to stand test in < 15 seconds indicating an increased LE strength and improved balance.    Baseline  19.38sec 02/11/18; 03/16/18: 18.0s, 04/01/18= 17.04 sec, 05/13/18= 18.06 sec    Time  8    Period  Weeks    Status  Partially Met    Target Date  07/23/18            Plan - 06/09/18 1222    Clinical Impression Statement  Patient demonstrates improved stability and strength allowing patient to perform short duration standing interventions with rest periods. Patient performs intermediate standing dynamic standing balance exercises to shift weight and perform single leg standing activities with mod assist. Patient fatigues quickly with exercises requiring rest breaks at this time. Patient will continue to benefit from skilled physical therapy to improve pain and mobility.    Rehab Potential  Good    PT Frequency  1x / week    PT Duration  8 weeks    PT Treatment/Interventions  Gait training;Therapeutic exercise;Therapeutic activities;Functional mobility training;Neuromuscular re-education;Patient/family education;Balance training    PT Next Visit Plan  Review recent HEP, make sure its working out well.  PT Home Exercise Plan  see atached document     Consulted and Agree with Plan of Care  Patient       Patient will benefit  from skilled therapeutic intervention in order to improve the following deficits and impairments:  Abnormal gait, Decreased balance, Decreased endurance, Postural dysfunction, Impaired flexibility, Decreased strength, Decreased safety awareness, Decreased activity tolerance, Decreased range of motion, Decreased knowledge of precautions, Impaired sensation, Difficulty walking  Visit Diagnosis: Muscle weakness (generalized)  Other lack of coordination  Difficulty in walking, not elsewhere classified     Problem List Patient Active Problem List   Diagnosis Date Noted  . Lower extremity edema 04/15/2017  . Macrocytic anemia 02/11/2017  . Vitamin D deficiency 02/02/2016  . Mixed hyperlipidemia 01/03/2015  . MS (multiple sclerosis) ([place]) 01/03/2015  . Chronic pain 01/03/2015  . GERD without esophagitis 01/03/2015  . COPD (chronic obstructive pulmonary disease) ([place]) 01/03/2015  . Osteopenia 01/03/2015  . Neurogenic bladder 01/03/2015  . Incontinence in female 01/03/2015    Alanson Puls, PT DPT 06/09/2018, 12:24 PM  [place] MAIN [facility] SERVICES [address] [place], [place], [address] Phone: [phone]   Fax:  [phone]  Name: KENZLEI RUNIONS MRN: [id number] Date of Birth: Feb 11, 1956

## 2018-06-09 NOTE — Therapy (Signed)
[place] [facility] MAIN [facility] SERVICES [address] [place], [place], [address] Phone: [phone]   Fax:  [phone]  Occupational Therapy Treatment  Patient Details  Name: [name] MRN: [id number] Date of Birth: [birth year] No data recorded  Encounter Date: 06/09/2018  OT End of Session - 06/11/18 0847    Visit Number  51    Number of Visits  72    Date for OT Re-Evaluation  07/02/17    Authorization Type  Visit 3 of 10 for progress report staring 06/02/2018    OT Start Time  1103    OT Stop Time  1145    OT Time Calculation (min)  42 min    Activity Tolerance  Patient tolerated treatment well    Behavior During Therapy  [facility] for tasks assessed/performed       Past Medical History:  Diagnosis Date  . Headache   . Hyperlipidemia   . MS (multiple sclerosis) (HCC) 1990  . Neurogenic bladder     Past Surgical History:  Procedure Laterality Date  . ABDOMINAL HYSTERECTOMY    . OOPHORECTOMY Bilateral     There were no vitals filed for this visit.     Patient seen for reaching tasks with left UE with shape tower, all 4 levels completed in sitting.  Cues for weight shift and  Reaching to 4th level. Some difficulty with reaching to 4th level but able to complete with short rest break and cues.   Reaching tasks combined with fine motor coordination of left dominant hand to pick up washers from resistive, magnetic Bowl and reach up to elevated surface to hang washers onto hooks.  Cues for prehension patterns with left.  Greater difficulty with removing washers from hook one at a time than with placing.  Some washers also placed onto table on flat surface and patient picking up and placing into bowl.                       OT Education - 06/11/18 0847    Education provided  Yes    Education Details  Left hand [facility] skills, reaching skills    Person(s) Educated  Patient    Methods  Explanation;Demonstration;Verbal cues     Comprehension  Verbalized understanding;Verbal cues required;Returned demonstration          OT Razzano Term Goals - 05/28/18 1307      OT [place] TERM GOAL #1   Title  Patient will demonstrate holding utensils in her left hand and feeding herself with modified independence with minimal spillage.      Baseline  04/09/2018: Pt spills >50% of food when using L hand. Pt is able to cut soft meats while using her fork.    Time  8    Period  Weeks    Status  Achieved      OT Rumery TERM GOAL #2   Title  Patient will complete tub/shower transfer with supervision only.    Baseline  04/09/2018 Pt requires min A to lift legs over tub for transfers    Time  8    Period  Weeks    Status  On-going      OT Mcdiarmid TERM GOAL #4   Title  Patient will increase left UE strength by 1 mm grade to be able to reach into closet to retrieve clothing items.     Baseline  04/09/2018 - Pt LUE strength 4-/5. Pt can reach items  in her closet independently    Time  12    Period  Weeks    Status  On-going      OT Rojero TERM GOAL #5   Title  Patient will improve coordination on the left to be able to hold pen securely and write her name with 75% legibility.     Baseline  04/09/2018 - Pt prints name with ~60% legibility using standard pen with a grip    Time  12    Period  Weeks    Status  On-going      OT Esteban TERM GOAL #6   Title  Patient will complete lower body dressing with modified independence using adaptive equipment if needed.     Baseline  04/09/2018 - Pt requires min A to don pants. Pt requires increased time to tie shoes independently. 05/28/18 modified independent    Time  12    Period  Weeks    Status  Achieved            Plan - 06/11/18 0847    Clinical Impression Statement  Patient continues to progress with coordination and reaching tasks, some tremors noted when she fatigues.  Patient requiring decreased assistance at home with self care tasks over the last few weeks.  She had more  difficulty with removing small washers from hooks today but did well with placing them.  Continue to work towards goals to inmprove independence with daily tasks.     Occupational Profile and client history currently impacting functional performance  patient is legally blind, progressive disease process with MS for over 20+ years, lives alone, limited availability for transportation.      Occupational performance deficits (Please refer to evaluation for details):  ADL's;Leisure    Rehab Potential  Good    OT Frequency  2x / week    OT Duration  12 weeks    OT Treatment/Interventions  Therapeutic exercise;Cognitive remediation/compensation;Functional Mobility Training    Consulted and Agree with Plan of Care  Patient       Patient will benefit from skilled therapeutic intervention in order to improve the following deficits and impairments:  Decreased cognition, Decreased knowledge of use of DME, Impaired vision/preception, Pain, Decreased coordination, Decreased activity tolerance, Decreased endurance, Decreased strength, Decreased balance, Impaired UE functional use  Visit Diagnosis: Muscle weakness (generalized)  Other lack of coordination    Problem List Patient Active Problem List   Diagnosis Date Noted  . Lower extremity edema 04/15/2017  . Macrocytic anemia 02/11/2017  . Vitamin D deficiency 02/02/2016  . Mixed hyperlipidemia 01/03/2015  . MS (multiple sclerosis) (HCC) 01/03/2015  . Chronic pain 01/03/2015  . GERD without esophagitis 01/03/2015  . COPD (chronic obstructive pulmonary disease) (HCC) 01/03/2015  . Osteopenia 01/03/2015  . Neurogenic bladder 01/03/2015  . Incontinence in female 01/03/2015   Lauretta Sallas T Arne Cleveland, OTR/L, CLT  Troye Hiemstra 06/11/2018, 8:54 AM  [place] [facility] MAIN [facility] SERVICES [address] [place], [place], [address] Phone: [phone]   Fax:  [phone]  Name: ZAIAH CREDEUR MRN: [id number] Date of Birth:  October 05, 1955

## 2018-06-11 ENCOUNTER — Ambulatory Visit: Payer: Medicare Other | Admitting: Physical Therapy

## 2018-06-11 ENCOUNTER — Encounter: Payer: Self-pay | Admitting: Occupational Therapy

## 2018-06-11 ENCOUNTER — Ambulatory Visit: Payer: Medicare Other | Admitting: Occupational Therapy

## 2018-06-11 DIAGNOSIS — R278 Other lack of coordination: Secondary | ICD-10-CM

## 2018-06-11 DIAGNOSIS — M6281 Muscle weakness (generalized): Secondary | ICD-10-CM

## 2018-06-11 DIAGNOSIS — R262 Difficulty in walking, not elsewhere classified: Secondary | ICD-10-CM | POA: Diagnosis not present

## 2018-06-11 NOTE — Therapy (Signed)
[place] [facility] [facility] MAIN [facility] [address] [place], [place], [address] Phone: [phone]   Fax:  [phone]  Occupational Therapy Treatment  Patient Details  Name: [name] MRN: [id number] Date of Birth: 1955/11/23 No data recorded  Encounter Date: 06/11/2018  OT End of Session - 06/11/18 1134    Visit Number  52    Number of Visits  72    Date for OT Re-Evaluation  07/02/17    Authorization Type  Visit 4 of 10 for progress report staring 06/02/2018    OT Start Time  1120    OT Stop Time  1205    OT Time Calculation (min)  45 min    Equipment Utilized During Treatment  magnifying glass    Activity Tolerance  Patient tolerated treatment well    Behavior During Therapy  [facility]WFL for tasks assessed/performed       Past Medical History:  Diagnosis Date  . Headache   . Hyperlipidemia   . MS (multiple sclerosis) (HCC) 1990  . Neurogenic bladder     Past Surgical History:  Procedure Laterality Date  . ABDOMINAL HYSTERECTOMY    . OOPHORECTOMY Bilateral     There were no vitals filed for this visit.  Subjective Assessment - 06/11/18 1129    Subjective   Pt. reports that she has has pain in her whole left side. Pt. reports the pain in always there.    Pertinent History  Patient reports she was diagnosed with MS years ago and has recently started to feel weak in her left dominant arm and hand.  She denies any previous therapy and reports memory issues and can provide a limited history.     Patient Stated Goals  Patient reports she wants to be able to walk better, use her left hand and be able to write again.     Pain Score  4     Pain Location  --   LUE, and LE   Pain Orientation  Left    Pain Descriptors / Indicators  Aching      OT TREATMENT    Neuro muscular re-education:  Pt. worked on grasping one inch resistive cubes alternating thumb opposition to the tip of the 2nd through 5th digits. The board was positioned at a  vertical angle. Pt. Worked on pressing them back into place while isolating 2nd through 5th digits.Pt. performed [facility] tasks using the grooved pegboard. Pt. worked on grasping the grooved pegs from a horizontal position, and moving the pegs to a vertical position in the hand to prepare for placing them in the grooved slot. Pt. had difficulty Turning the pegs to fit in the proper direction on the pegboard requiring verbal, and visual cues.,   Selfcare:  Pt. worked on simulated self feeding using a fork, and knife for cutting through pink resistive rolled into a 1/2" log.                        OT Education - 06/11/18 1134    Education provided  Yes    Education Details  Left hand [facility] skills, reaching skills    Person(s) Educated  Patient    Methods  Explanation;Demonstration;Verbal cues    Comprehension  Verbalized understanding;Verbal cues required;Returned demonstration          OT [name] Term Goals - 05/28/18 1307      OT Schuneman TERM GOAL #1   Title  Patient will demonstrate  holding utensils in her left hand and feeding herself with modified independence with minimal spillage.      Baseline  04/09/2018: Pt spills >50% of food when using L hand. Pt is able to cut soft meats while using her fork.    Time  8    Period  Weeks    Status  Achieved      OT Spruce TERM GOAL #2   Title  Patient will complete tub/shower transfer with supervision only.    Baseline  04/09/2018 Pt requires min A to lift legs over tub for transfers    Time  8    Period  Weeks    Status  On-going      OT Lehn TERM GOAL #4   Title  Patient will increase left UE strength by 1 mm grade to be able to reach into closet to retrieve clothing items.     Baseline  04/09/2018 - Pt LUE strength 4-/5. Pt can reach items in her closet independently    Time  12    Period  Weeks    Status  On-going      OT Daubenspeck TERM GOAL #5   Title  Patient will improve coordination on the left to be able to hold pen  securely and write her name with 75% legibility.     Baseline  04/09/2018 - Pt prints name with ~60% legibility using standard pen with a grip    Time  12    Period  Weeks    Status  On-going      OT Mane TERM GOAL #6   Title  Patient will complete lower body dressing with modified independence using adaptive equipment if needed.     Baseline  04/09/2018 - Pt requires min A to don pants. Pt requires increased time to tie shoes independently. 05/28/18 modified independent    Time  12    Period  Weeks    Status  Achieved            Plan - 06/11/18 1134    Clinical Impression Statement  Pt. continues to present with impaired LUE motor control, and coordination skills. Pt. has difficulty using her left hand for cutting food, and writing. Pt. continues to work on improving UE strength, and [facility] skills in order to improve ADL, and IADL functioning.     Occupational Profile and client history currently impacting functional performance  patient is legally blind, progressive disease process with MS for over 20+ years, lives alone, limited availability for transportation.      Occupational performance deficits (Please refer to evaluation for details):  ADL's;Leisure    Rehab Potential  Good    OT Frequency  2x / week    OT Duration  12 weeks    OT Treatment/Interventions  Therapeutic exercise;Cognitive remediation/compensation;Functional Mobility Training    Plan  Positive:  motivation, family support.  Negative:  lives alone, legally blind, requires caregiver 7 days a week for a few hours a day for self care    Clinical Decision Making  Several treatment options, min-mod task modification necessary    Consulted and Agree with Plan of Care  Patient       Patient will benefit from skilled therapeutic intervention in order to improve the following deficits and impairments:  Decreased cognition, Decreased knowledge of use of DME, Impaired vision/preception, Pain, Decreased coordination, Decreased  activity tolerance, Decreased endurance, Decreased strength, Decreased balance, Impaired UE functional use  Visit Diagnosis: Muscle weakness (generalized)  Other lack of coordination    Problem List Patient Active Problem List   Diagnosis Date Noted  . Lower extremity edema 04/15/2017  . Macrocytic anemia 02/11/2017  . Vitamin D deficiency 02/02/2016  . Mixed hyperlipidemia 01/03/2015  . MS (multiple sclerosis) (HCC) 01/03/2015  . Chronic pain 01/03/2015  . GERD without esophagitis 01/03/2015  . COPD (chronic obstructive pulmonary disease) (HCC) 01/03/2015  . Osteopenia 01/03/2015  . Neurogenic bladder 01/03/2015  . Incontinence in female 01/03/2015    Olegario Messier, MS, OTR/L 06/11/2018, 11:46 AM  [place] [facility] MAIN [facility] SERVICES [address] [place], [place], [address] Phone: [phone]   Fax:  [phone]  Name: [name] MRN: [id number] Date of Birth: 08-Oct-1955

## 2018-06-18 ENCOUNTER — Encounter: Payer: Self-pay | Admitting: Occupational Therapy

## 2018-06-18 ENCOUNTER — Ambulatory Visit: Payer: Medicare Other

## 2018-06-18 ENCOUNTER — Ambulatory Visit: Payer: Medicare Other | Admitting: Occupational Therapy

## 2018-06-18 VITALS — BP 117/77 | HR 95

## 2018-06-18 DIAGNOSIS — R278 Other lack of coordination: Secondary | ICD-10-CM

## 2018-06-18 DIAGNOSIS — R262 Difficulty in walking, not elsewhere classified: Secondary | ICD-10-CM

## 2018-06-18 DIAGNOSIS — M6281 Muscle weakness (generalized): Secondary | ICD-10-CM

## 2018-06-18 NOTE — Therapy (Signed)
[place] [facility] MAIN [facility] SERVICES [address] [place], [place], [address] Phone: [phone]   Fax:  [phone]  Occupational Therapy Treatment  Patient Details  Name: [name] MRN: [id number] Date of Birth: 05-19-1956 No data recorded  Encounter Date: 06/18/2018  OT End of Session - 06/18/18 1313    Visit Number  53    Number of Visits  72    Date for OT Re-Evaluation  07/02/17    Authorization Type  Visit 5 of 10 for progress report staring 06/02/2018    OT Start Time  1302    OT Stop Time  1345    OT Time Calculation (min)  43 min    Activity Tolerance  Patient tolerated treatment well    Behavior During Therapy  [facility] for tasks assessed/performed       Past Medical History:  Diagnosis Date  . Headache   . Hyperlipidemia   . MS (multiple sclerosis) (HCC) 1990  . Neurogenic bladder     Past Surgical History:  Procedure Laterality Date  . ABDOMINAL HYSTERECTOMY    . OOPHORECTOMY Bilateral     There were no vitals filed for this visit.  Subjective Assessment - 06/18/18 1311    Subjective   Patient reports she still has pain on her left side, "Its like that all the time, doesn't change."      Pertinent History  Patient reports she was diagnosed with MS years ago and has recently started to feel weak in her left dominant arm and hand.  She denies any previous therapy and reports memory issues and can provide a limited history.     Patient Stated Goals  Patient reports she wants to be able to walk better, use her left hand and be able to write again.     Currently in Pain?  Yes    Pain Score  3     Pain Location  Arm    Pain Orientation  Left    Pain Descriptors / Indicators  Aching    Pain Onset  More than a month ago    Pain Frequency  Constant    Multiple Pain Sites  No          Patient seen for manipulation of 100 peg/Judy board with picking up and flipping to opposite side with cues for prehension and manipulation  patterns Using middle finger and thumb to pick up and index to turn.  Short rest breaks required at times with hand fatigue.  Some tremors noted in left during task.   Manipulation of coins from flat tabletop to pick up one at at time and place into resistive bank with cues for keeping coins in upright Position, location of slot and strength to push it thru.    Handwriting-difficulty copying text due to visual deficits, focused on writing name and names of family members. Use of large grip pen With left dominant hand.                       OT Madl Term Goals - 05/28/18 1307      OT Holland TERM GOAL #1   Title  Patient will demonstrate holding utensils in her left hand and feeding herself with modified independence with minimal spillage.      Baseline  04/09/2018: Pt spills >50% of food when using L hand. Pt is able to cut soft meats while using her fork.    Time  8  Period  Weeks    Status  Achieved      OT Reinard TERM GOAL #2   Title  Patient will complete tub/shower transfer with supervision only.    Baseline  04/09/2018 Pt requires min A to lift legs over tub for transfers    Time  8    Period  Weeks    Status  On-going      OT Camilli TERM GOAL #4   Title  Patient will increase left UE strength by 1 mm grade to be able to reach into closet to retrieve clothing items.     Baseline  04/09/2018 - Pt LUE strength 4-/5. Pt can reach items in her closet independently    Time  12    Period  Weeks    Status  On-going      OT Knueppel TERM GOAL #5   Title  Patient will improve coordination on the left to be able to hold pen securely and write her name with 75% legibility.     Baseline  04/09/2018 - Pt prints name with ~60% legibility using standard pen with a grip    Time  12    Period  Weeks    Status  On-going      OT Artola TERM GOAL #6   Title  Patient will complete lower body dressing with modified independence using adaptive equipment if needed.     Baseline   04/09/2018 - Pt requires min A to don pants. Pt requires increased time to tie shoes independently. 05/28/18 modified independent    Time  12    Period  Weeks    Status  Achieved            Plan - 06/18/18 1314    Clinical Impression Statement  Patient demonstrating improved legiblity this date with handwriting of her name and family members names, 70% legible.  She continues to demonstrate tremors at times when she is attempting to perform tasks requiring precision skills of the left hand.  Continue to work towards goals .     Occupational Profile and client history currently impacting functional performance  patient is legally blind, progressive disease process with MS for over 20+ years, lives alone, limited availability for transportation.      Occupational performance deficits (Please refer to evaluation for details):  ADL's;Leisure    Rehab Potential  Good    OT Frequency  2x / week    OT Duration  12 weeks    OT Treatment/Interventions  Therapeutic exercise;Cognitive remediation/compensation;Functional Mobility Training    Consulted and Agree with Plan of Care  Patient       Patient will benefit from skilled therapeutic intervention in order to improve the following deficits and impairments:  Decreased cognition, Decreased knowledge of use of DME, Impaired vision/preception, Pain, Decreased coordination, Decreased activity tolerance, Decreased endurance, Decreased strength, Decreased balance, Impaired UE functional use  Visit Diagnosis: Muscle weakness (generalized)  Other lack of coordination    Problem List Patient Active Problem List   Diagnosis Date Noted  . Lower extremity edema 04/15/2017  . Macrocytic anemia 02/11/2017  . Vitamin D deficiency 02/02/2016  . Mixed hyperlipidemia 01/03/2015  . MS (multiple sclerosis) (HCC) 01/03/2015  . Chronic pain 01/03/2015  . GERD without esophagitis 01/03/2015  . COPD (chronic obstructive pulmonary disease) (HCC) 01/03/2015   . Osteopenia 01/03/2015  . Neurogenic bladder 01/03/2015  . Incontinence in female 01/03/2015   Amy T Lovett, OTR/L, CLT  Lovett,Amy 06/19/2018, 2:45 PM  [place] [facility] MAIN [facility] SERVICES [address] [place], [place], [address] Phone: [phone]   Fax:  [phone]  Name: [name] MRN: [id number] Date of Birth: [birth year]

## 2018-06-18 NOTE — Therapy (Signed)
[place] MAIN [facility] SERVICES [address] [place], [place], [address] Phone: [phone]   Fax:  [phone]  Physical Therapy Treatment  Patient Details  Name: [name] MRN: [id number] Date of Birth: 1956/04/01 Referring Provider (PT): Anabel Bene   Encounter Date: 06/18/2018  PT End of Session - 06/18/18 1354    Visit Number  35    Number of Visits  49    Date for PT Re-Evaluation  07/23/18    Authorization Type  4/10    PT Start Time  1347    PT Stop Time  1430    PT Time Calculation (min)  43 min    Equipment Utilized During Treatment  Gait belt    Activity Tolerance  Patient tolerated treatment well    Behavior During Therapy  WFL for tasks assessed/performed       Past Medical History:  Diagnosis Date  . Headache   . Hyperlipidemia   . MS (multiple sclerosis) ([place]) 1990  . Neurogenic bladder     Past Surgical History:  Procedure Laterality Date  . ABDOMINAL HYSTERECTOMY    . OOPHORECTOMY Bilateral     Vitals:   06/18/18 1352  BP: 117/77  Pulse: 95  SpO2: 100%    Subjective Assessment - 06/18/18 1353    Subjective  Pt reports feeling ok today. No specific questions at this time. She complains of bilateral low back pain for the last month. She states that she has talked to her personal aid who plans to take her back to the doctor over the next week if it doesn't improve.     Pertinent History  Patient reports about 3 months ago she started to feel weak in the left arm, reports she is legally blind.       How Enck can you stand comfortably?  not too Mazzarella     Patient Stated Goals  Patient wants to be able to walk better.     Currently in Pain?  Yes    Pain Score  9     Pain Location  Back    Pain Orientation  Left    Pain Descriptors / Indicators  Aching    Pain Type  Chronic pain    Pain Onset  More than a month ago    Pain Frequency  Constant          TREATMENT   Ther-ex NuStep L3 x 5  minutes for warm-up, called patient's aid, Dwain Sarna, to discuss patient's complaints of back pain.  Seated LAQ with 2.5# ankle weights with 3 second holdsx 20 BLE; Seated marches with 2.5# ankle weights x 20 BLE; Seated clams with manual resistance x 20; Seated adductor squeeze with manual resistance x 20; Standing hip abd with 2.5# ankle weight x20BLE and BUE support  Standing hip extension with 2.5# ankle weight x20 BLE with BUE support  Standing HS curls with 2.5# ankle weight x20 BLE with BUE support  Standing heel raises x20with 2.5# ankle weights  STS from chair without UE support x 10;     Pt educated throughout session about proper posture and technique with exercises. Improved exercise technique, movement at target joints, use of target muscles after min to mod verbal, visual, tactile cues.    Patient complaining of low back today that has been persistent for the last month. No known MOI. Reports increased frequency of urination but denies pain with urination, fever, or chills. Spoke with aid and  notified her of symptoms. Aid stated she would follow for the next week and follow-up with MD if needed. Pt is able to continue with her LE strengthening today. Patient fatigues quickly with exercises requiring rest breaks at this time. Patient will continue to benefit from skilled physical therapy to improve pain and mobility.                  PT Short Term Goals - 05/28/18 1416      PT SHORT TERM GOAL #1   Title  Patient will be independent in home exercise program to improve strength/mobility for better functional independence with ADLs.    Baseline  reports that she is not doing a home exercise program;     Time  4    Period  Weeks    Status  Not Met    Target Date  06/25/18      PT SHORT TERM GOAL #2   Title  Pt (< 59 years old) will complete five times sit to stand test in < 10 seconds indicating an increased LE strength and improved balance.     Baseline  02/11/18= 19.38; 03/16/18: 18.0s,  05/13/18=18.06 sec; 05/28/18: 18.59 sec    Time  4    Period  Weeks    Status  Not Met    Target Date  06/25/18        PT Azimi Term Goals - 05/28/18 1417      PT Lindamood TERM GOAL #1   Title  Patient will increase six minute walk test distance to >1000 for progression to community ambulator and improve gait ability    Baseline  02/11/18= 500 feet; 03/16/18: 830 feet,  04/01/18= 515,05/13/18 605 ft; 05/28/18: 815 feet    Time  8    Period  Weeks    Status  Partially Met    Target Date  07/23/18      PT Winsett TERM GOAL #2   Title  Patient will increase 10 meter walk test to >1.25ms as to improve gait speed for better community ambulation and to reduce fall risk.    Baseline  02/11/18 . 68 m/sec; 03/16/18: self-selected: 12.6s= 0.79 m/s fastest: 10.4s = 0.96 m/s, 04/01/18= .1.0 m/sec,05/13/18; 05/28/18: 0.89 m/s with rollator    Time  8    Period  Weeks    Status  Not Met    Target Date  07/23/18      PT Lucien TERM GOAL #3   Title  Patient will reduce timed up and go to <11 seconds to reduce fall risk and demonstrate improved transfer/gait ability.    Baseline  17.49 sec 02/11/18; 03/16/18: 16.8s, 04/01/18= 15.9 sec, 05/13/18 14.87 sec; 05/28/18: 18.8 sec    Time  8    Period  Weeks    Status  Not Met    Target Date  07/23/18      PT Finder TERM GOAL #4   Title  Patient (> 634years old) will complete five times sit to stand test in < 15 seconds indicating an increased LE strength and improved balance.    Baseline  19.38sec 02/11/18; 03/16/18: 18.0s, 04/01/18= 17.04 sec, 05/13/18= 18.06 sec    Time  8    Period  Weeks    Status  Partially Met    Target Date  07/23/18            Plan - 06/18/18 1355    Clinical Impression Statement  Patient complaining of low back today  that has been persistent for the last month. No known MOI. Reports increased frequency of urination but denies pain with urination, fever, or chills. Spoke with aid and notified her of  symptoms. Aid stated she would follow for the next week and follow-up with MD if needed. Pt is able to continue with her LE strengthening today. Patient fatigues quickly with exercises requiring rest breaks at this time. Patient will continue to benefit from skilled physical therapy to improve pain and mobility.    Rehab Potential  Good    PT Frequency  1x / week    PT Duration  8 weeks    PT Treatment/Interventions  Gait training;Therapeutic exercise;Therapeutic activities;Functional mobility training;Neuromuscular re-education;Patient/family education;Balance training    PT Next Visit Plan  Review recent HEP, make sure its working out well.     PT Home Exercise Plan  see atached document     Consulted and Agree with Plan of Care  Patient       Patient will benefit from skilled therapeutic intervention in order to improve the following deficits and impairments:  Abnormal gait, Decreased balance, Decreased endurance, Postural dysfunction, Impaired flexibility, Decreased strength, Decreased safety awareness, Decreased activity tolerance, Decreased range of motion, Decreased knowledge of precautions, Impaired sensation, Difficulty walking  Visit Diagnosis: Muscle weakness (generalized)  Other lack of coordination  Difficulty in walking, not elsewhere classified     Problem List Patient Active Problem List   Diagnosis Date Noted  . Lower extremity edema 04/15/2017  . Macrocytic anemia 02/11/2017  . Vitamin D deficiency 02/02/2016  . Mixed hyperlipidemia 01/03/2015  . MS (multiple sclerosis) ([place]) 01/03/2015  . Chronic pain 01/03/2015  . GERD without esophagitis 01/03/2015  . COPD (chronic obstructive pulmonary disease) ([place]) 01/03/2015  . Osteopenia 01/03/2015  . Neurogenic bladder 01/03/2015  . Incontinence in female 01/03/2015   Phillips Grout PT, DPT, GCS  Huprich,Jason 06/18/2018, 4:18 PM  [place] MAIN [facility] SERVICES [address]  [place], [place], [address] Phone: [phone]   Fax:  [phone]  Name: [name] MRN: [id number] Date of Birth: January 15, 1956

## 2018-06-23 ENCOUNTER — Encounter: Payer: Self-pay | Admitting: Occupational Therapy

## 2018-06-23 ENCOUNTER — Ambulatory Visit: Payer: Medicare Other | Admitting: Physical Therapy

## 2018-06-23 ENCOUNTER — Ambulatory Visit: Payer: Medicare Other | Admitting: Occupational Therapy

## 2018-06-23 DIAGNOSIS — R278 Other lack of coordination: Secondary | ICD-10-CM | POA: Diagnosis not present

## 2018-06-23 DIAGNOSIS — M6281 Muscle weakness (generalized): Secondary | ICD-10-CM

## 2018-06-23 DIAGNOSIS — R262 Difficulty in walking, not elsewhere classified: Secondary | ICD-10-CM | POA: Diagnosis not present

## 2018-06-23 NOTE — Therapy (Signed)
[facility] [address] [place], [place], [address] Phone: [phone]   Fax:  [phone]  Occupational Therapy Treatment  Patient Details  Name: [name] MRN: [id number] Date of Birth: 08-10-1955 No data recorded  Encounter Date: 06/23/2018  OT End of Session - 06/23/18 1154    Visit Number  54    Number of Visits  72    Date for OT Re-Evaluation  07/02/17    Authorization Type  Visit 6 of 10 for progress report staring 06/02/2018    OT Start Time  1145    OT Stop Time  1230    OT Time Calculation (min)  45 min    Activity Tolerance  Patient tolerated treatment well    Behavior During Therapy  [facility] for tasks assessed/performed       Past Medical History:  Diagnosis Date  . Headache   . Hyperlipidemia   . MS (multiple sclerosis) (HCC) 1990  . Neurogenic bladder     Past Surgical History:  Procedure Laterality Date  . ABDOMINAL HYSTERECTOMY    . OOPHORECTOMY Bilateral    OT TREATMENT    Neuro muscular re-education:  Pt. worked on [facility] grasping 1/2" small magnetic pegs, disconnecting them, and placing them on a positioned at a vertical angle at a tabletop, on an elevated angle to encourage [facility] skills with arms sustained in elevation. Pt. worked on removing the pegs while alternating thumb opposition to the tip of the 2nd through 5th digits. Pt. worked on grasping heavy duty resistive magnets of varying sizes. Pt. worked on placing them on a whiteboard positioned at a vertical angle on the tabletop, on an elevated surface to encourage [facility] while UEs are elevated. Pt. worked on Human resources officerdisconnecting the magnets with resistance.  Self-Care:  Pt. worked on Mining engineerwriting skills, and Careers information officerwriting legibility. Pt. worked on [employer] Corporationmaking word lists of animals, ans colors with 25% legibility initially, progressing to 50% legibility using her left hand. Pt. deviated from the line using large print writing. Increased time was  required.                            OT Education - 06/23/18 1153    Education provided  Yes    Education Details  Left hand [facility] skills, reaching skills    Person(s) Educated  Patient    Methods  Explanation;Demonstration;Verbal cues    Comprehension  Verbalized understanding;Verbal cues required;Returned demonstration          OT Masl[place] Term Goals - 05/28/18 1307      OT Meter TERM GOAL #1   Title  Patient will demonstrate holding utensils in her left hand and feeding herself with modified independence with minimal spillage.      Baseline  04/09/2018: Pt spills >50% of food when using L hand. Pt is able to cut soft meats while using her fork.    Time  8    Period  Weeks    Status  Achieved      OT Herrman TERM GOAL #2   Title  Patient will complete tub/shower transfer with supervision only.    Baseline  04/09/2018 Pt requires min A to lift legs over tub for transfers    Time  8    Period  Weeks    Status  On-going      OT [name] TERM GOAL #4   Title  Patient will increase left UE  strength by 1 mm grade to be able to reach into closet to retrieve clothing items.     Baseline  04/09/2018 - Pt LUE strength 4-/5. Pt can reach items in her closet independently    Time  12    Period  Weeks    Status  On-going      OT Skow TERM GOAL #5   Title  Patient will improve coordination on the left to be able to hold pen securely and write her name with 75% legibility.     Baseline  04/09/2018 - Pt prints name with ~60% legibility using standard pen with a grip    Time  12    Period  Weeks    Status  On-going      OT Borenstein TERM GOAL #6   Title  Patient will complete lower body dressing with modified independence using adaptive equipment if needed.     Baseline  04/09/2018 - Pt requires min A to don pants. Pt requires increased time to tie shoes independently. 05/28/18 modified independent    Time  12    Period  Weeks    Status  Achieved            Plan -  06/23/18 1154    Clinical Impression Statement Pt. reports that her nephew has died, and reports that she will need to go to the funeral on Friday. Pt. continues to present with limited LUE strength, and [facility] skills. Pt. continues to work on improving LUE functioning , motor control, and [facility] skills for improved ADLs, and IADL functioning, and improved writing skills.   Occupational Profile and client history currently impacting functional performance  patient is legally blind, progressive disease process with MS for over 20+ years, lives alone, limited availability for transportation.      Occupational performance deficits (Please refer to evaluation for details):  ADL's;Leisure    Rehab Potential  Good    OT Frequency  2x / week    OT Duration  12 weeks    OT Treatment/Interventions  Therapeutic exercise;Cognitive remediation/compensation;Functional Mobility Training    Plan  Positive:  motivation, family support.  Negative:  lives alone, legally blind, requires caregiver 7 days a week for a few hours a day for self care    Clinical Decision Making  Several treatment options, min-mod task modification necessary    Consulted and Agree with Plan of Care  Patient       Patient will benefit from skilled therapeutic intervention in order to improve the following deficits and impairments:  Decreased cognition, Decreased knowledge of use of DME, Impaired vision/preception, Pain, Decreased coordination, Decreased activity tolerance, Decreased endurance, Decreased strength, Decreased balance, Impaired UE functional use  Visit Diagnosis: Muscle weakness (generalized)  Other lack of coordination    Problem List Patient Active Problem List   Diagnosis Date Noted  . Lower extremity edema 04/15/2017  . Macrocytic anemia 02/11/2017  . Vitamin D deficiency 02/02/2016  . Mixed hyperlipidemia 01/03/2015  . MS (multiple sclerosis) (HCC) 01/03/2015  . Chronic pain 01/03/2015  . GERD without esophagitis  01/03/2015  . COPD (chronic obstructive pulmonary disease) (HCC) 01/03/2015  . Osteopenia 01/03/2015  . Neurogenic bladder 01/03/2015  . Incontinence in female 01/03/2015    Olegario Messier, MS, OTR/L 06/23/2018, 12:07 PM  [facility] MAIN [facility] SERVICES [address] [place], [place], [address] Phone: [phone]   Fax:  [phone]  Name: [name] MRN: [id number] Date of Birth: July 25, 1955

## 2018-06-29 ENCOUNTER — Ambulatory Visit: Payer: Medicare Other | Admitting: Physical Therapy

## 2018-06-29 ENCOUNTER — Ambulatory Visit: Payer: Medicare Other | Attending: Neurology | Admitting: Occupational Therapy

## 2018-06-29 ENCOUNTER — Encounter: Payer: Self-pay | Admitting: Physical Therapy

## 2018-06-29 ENCOUNTER — Encounter: Payer: Self-pay | Admitting: Occupational Therapy

## 2018-06-29 DIAGNOSIS — R278 Other lack of coordination: Secondary | ICD-10-CM

## 2018-06-29 DIAGNOSIS — M6281 Muscle weakness (generalized): Secondary | ICD-10-CM | POA: Insufficient documentation

## 2018-06-29 DIAGNOSIS — R262 Difficulty in walking, not elsewhere classified: Secondary | ICD-10-CM

## 2018-06-29 NOTE — Therapy (Signed)
[facility] MAIN [facility] SERVICES [address] [place], [place], [address] Phone: [phone]   Fax:  [phone]  Occupational Therapy Treatment  Patient Details  Name: [name] MRN: [id number] Date of Birth: 1956/05/09 No data recorded  Encounter Date: 06/29/2018  OT End of Session - 06/29/18 1058    Visit Number  55    Number of Visits  72    Date for OT Re-Evaluation  07/02/17    Authorization Type  Visit 7 of 10 for progress report staring 06/02/2018    OT Start Time  1045    OT Stop Time  1130    OT Time Calculation (min)  45 min    Equipment Utilized During Treatment  magnifying glass    Activity Tolerance  Patient tolerated treatment well    Behavior During Therapy  [facility] for tasks assessed/performed       Past Medical History:  Diagnosis Date  . Headache   . Hyperlipidemia   . MS (multiple sclerosis) (HCC) 1990  . Neurogenic bladder     Past Surgical History:  Procedure Laterality Date  . ABDOMINAL HYSTERECTOMY    . OOPHORECTOMY Bilateral     There were no vitals filed for this visit.  Subjective Assessment - 06/29/18 1055    Subjective   Patient reports she still has pain on her left side, "Its like that all the time, doesn't change."      Pertinent History  Patient reports she was diagnosed with MS years ago and has recently started to feel weak in her left dominant arm and hand.  She denies any previous therapy and reports memory issues and can provide a limited history.     Patient Stated Goals  Patient reports she wants to be able to walk better, use her left hand and be able to write again.     Currently in Pain?  Yes    Pain Score  4     Pain Location  --   left side   Pain Orientation  Left    Pain Descriptors / Indicators  Aching      OT TREATMENT    Neuro muscular re-education:  Pt. worked on using her left hand for grasping, and positioning magnetic hooks on a whiteboard positioned at a vertical angle  on the tabletop. Pt. worked on [facility] skills grasping 1/2" flat washers, and placing them on the hooks.   Selfcare:  Pt. worked on Diplomatic Services operational officer tasks, and Data processing manager with writing legibility in printed form at 25%. Pt. worked on using a pen, with a small built-up handle in place.                        OT Education - 06/29/18 1057    Education provided  Yes    Education Details  Left hand [facility] skills, reaching skills    Person(s) Educated  Patient    Methods  Explanation;Demonstration;Verbal cues    Comprehension  Verbalized understanding;Verbal cues required;Returned demonstration          OT [place] Term Goals - 05/28/18 1307      OT Freedman TERM GOAL #1   Title  Patient will demonstrate holding utensils in her left hand and feeding herself with modified independence with minimal spillage.      Baseline  04/09/2018: Pt spills >50% of food when using L hand. Pt is able to cut soft meats while using her fork.  Time  8    Period  Weeks    Status  Achieved      OT Guerrier TERM GOAL #2   Title  Patient will complete tub/shower transfer with supervision only.    Baseline  04/09/2018 Pt requires min A to lift legs over tub for transfers    Time  8    Period  Weeks    Status  On-going      OT Callender TERM GOAL #4   Title  Patient will increase left UE strength by 1 mm grade to be able to reach into closet to retrieve clothing items.     Baseline  04/09/2018 - Pt LUE strength 4-/5. Pt can reach items in her closet independently    Time  12    Period  Weeks    Status  On-going      OT Lafon TERM GOAL #5   Title  Patient will improve coordination on the left to be able to hold pen securely and write her name with 75% legibility.     Baseline  04/09/2018 - Pt prints name with ~60% legibility using standard pen with a grip    Time  12    Period  Weeks    Status  On-going      OT Odriscoll TERM GOAL #6   Title  Patient will complete lower body dressing with modified  independence using adaptive equipment if needed.     Baseline  04/09/2018 - Pt requires min A to don pants. Pt requires increased time to tie shoes independently. 05/28/18 modified independent    Time  12    Period  Weeks    Status  Achieved            Plan - 06/29/18 1059    Clinical Impression Statement  Pt. had her nephew's funeral this past Friday. Pt. is making progress overall, and reports that feeding herself is getting easier, however reports that she is still needs to wear a bib. Pt. conitnues to present with limited writing legibility. Pt. continues to work on improving UE strength, and [facility] skills in order to improving UE functioning, and ADL, and IADL skills.    Occupational Profile and client history currently impacting functional performance  patient is legally blind, progressive disease process with MS for over 20+ years, lives alone, limited availability for transportation.      Occupational performance deficits (Please refer to evaluation for details):  ADL's;Leisure    Rehab Potential  Good    OT Frequency  2x / week    OT Duration  12 weeks    OT Treatment/Interventions  Therapeutic exercise;Cognitive remediation/compensation;Functional Mobility Training    Plan  Positive:  motivation, family support.  Negative:  lives alone, legally blind, requires caregiver 7 days a week for a few hours a day for self care    Clinical Decision Making  Several treatment options, min-mod task modification necessary    Consulted and Agree with Plan of Care  Patient       Patient will benefit from skilled therapeutic intervention in order to improve the following deficits and impairments:  Decreased cognition, Decreased knowledge of use of DME, Impaired vision/preception, Pain, Decreased coordination, Decreased activity tolerance, Decreased endurance, Decreased strength, Decreased balance, Impaired UE functional use  Visit Diagnosis: Muscle weakness (generalized)  Other lack of  coordination    Problem List Patient Active Problem List   Diagnosis Date Noted  . Lower extremity edema 04/15/2017  . Macrocytic  anemia 02/11/2017  . Vitamin D deficiency 02/02/2016  . Mixed hyperlipidemia 01/03/2015  . MS (multiple sclerosis) (HCC) 01/03/2015  . Chronic pain 01/03/2015  . GERD without esophagitis 01/03/2015  . COPD (chronic obstructive pulmonary disease) (HCC) 01/03/2015  . Osteopenia 01/03/2015  . Neurogenic bladder 01/03/2015  . Incontinence in female 01/03/2015    Olegario Messier, MS, OTR/L 06/29/2018, 11:14 AM  [facility] MAIN [facility] SERVICES [address] [place], [place], [address] Phone: [phone]   Fax:  [phone]  Name: [name] MRN: [id number] Date of Birth: 08-03-1955

## 2018-06-29 NOTE — Therapy (Signed)
[place] MAIN [facility] SERVICES [address] [place], [place], [address] Phone: [phone]   Fax:  [phone]  Physical Therapy Treatment/ Physical Therapy Progress Note   Dates of reporting period  05/13/18   to   06/29/18  Patient Details  Name: [name] MRN: [id number] Date of Birth: January 18, 1956 Referring Provider (PT): Anabel Bene   Encounter Date: 06/29/2018  PT End of Session - 06/29/18 1138    Visit Number  36    Number of Visits  49    Date for PT Re-Evaluation  07/23/18    Authorization Type  5/10    PT Start Time  1145    PT Stop Time  1230    PT Time Calculation (min)  45 min    Equipment Utilized During Treatment  Gait belt    Activity Tolerance  Patient tolerated treatment well    Behavior During Therapy  WFL for tasks assessed/performed       Past Medical History:  Diagnosis Date  . Headache   . Hyperlipidemia   . MS (multiple sclerosis) ([place]) 1990  . Neurogenic bladder     Past Surgical History:  Procedure Laterality Date  . ABDOMINAL HYSTERECTOMY    . OOPHORECTOMY Bilateral     There were no vitals filed for this visit.  Subjective Assessment - 06/29/18 1138    Subjective  Patient had a nephew pass away and the funeral was friday.     Pertinent History  Patient reports about 3 months ago she started to feel weak in the left arm, reports she is legally blind.       How [place] can you stand comfortably?  not too [place]     Patient Stated Goals  Patient wants to be able to walk better.     Currently in Pain?  No/denies    Pain Score  0-No pain    Pain Onset  More than a month ago      Goals reviewed with outcome measures performed including 6 MW test, 5 x sit to stand, TUG, 10 MW test Ther-ex  Standing hip abd with 2# ankle weight x 15 BLE and  BUE support  Standing hip extension with 2# ankle weight x 10 BLE with BUE support  Standing hip flexion marches with 2# ankle weight x 10 BLE with BUE support   Standing HS curls with 2# ankle weight x 10 BLE with BUE support  Leg press 90 lbs 20 x 3      CGA and Min  verbal cues used throughout with increased in postural sway and LOB most seen with narrow base of support and while on uneven surfaces. Continues to have balance deficits typical with diagnosis. Patient performs intermediate level exercises without pain behaviors and needs verbal cuing for postural alignment and head positioning                      PT Education - 06/29/18 1138    Education Details  HEP    Person(s) Educated  Patient    Methods  Explanation    Comprehension  Verbalized understanding       PT Short Term Goals - 05/28/18 1416      PT SHORT TERM GOAL #1   Title  Patient will be independent in home exercise program to improve strength/mobility for better functional independence with ADLs.    Baseline  reports that she is not doing a home exercise  program;     Time  4    Period  Weeks    Status  Not Met    Target Date  06/25/18      PT SHORT TERM GOAL #2   Title  Pt (< 42 years old) will complete five times sit to stand test in < 10 seconds indicating an increased LE strength and improved balance.    Baseline  02/11/18= 19.38; 03/16/18: 18.0s,  05/13/18=18.06 sec; 05/28/18: 18.59 sec    Time  4    Period  Weeks    Status  Not Met    Target Date  06/25/18        PT Ellen[place] Term Goals - 06/29/18 1200      PT H[place]field TERM GOAL #1   Title  Patient will increase six minute walk test distance to >1000 for progression to community ambulator and improve gait ability    Baseline  02/11/18= 500 feet; 03/16/18: 830 feet,  04/01/18= 515,05/13/18 605 ft; 05/28/18: 815 feet,  06/29/18=830 ft    Time  8    Period  Weeks    Status  Partially Met    Target Date  07/23/18      PT Skoda TERM GOAL #2   Title  Patient will increase 10 meter walk test to >1.13ms as to improve gait speed for better community ambulation and to reduce fall risk.    Baseline  02/11/18 . 68  m/sec; 03/16/18: self-selected: 12.6s= 0.79 m/s fastest: 10.4s = 0.96 m/s, 04/01/18= .1.0 m/sec,05/13/18; 05/28/18: 0.89 m/s with rollator 06/29/18=1.02 m/sec    Time  8    Period  Weeks    Status  Not Met    Target Date  07/23/18      PT Kloss TERM GOAL #3   Title  Patient will reduce timed up and go to <11 seconds to reduce fall risk and demonstrate improved transfer/gait ability.    Baseline  17.49 sec 02/11/18; 03/16/18: 16.8s, 04/01/18= 15.9 sec, 05/13/18 14.87 sec; 05/28/18: 18.8 sec, 06/29/18=17.60 sec    Time  8    Period  Weeks    Status  Not Met    Target Date  07/23/18      PT Mailhot TERM GOAL #4   Title  Patient (> 689years old) will complete five times sit to stand test in < 15 seconds indicating an increased LE strength and improved balance.    Baseline  19.38sec 02/11/18; 03/16/18: 18.0s, 04/01/18= 17.04 sec, 05/13/18= 18.06 sec1/6/20=18.65    Time  8    Period  Weeks    Status  Partially Met    Target Date  07/23/18            Plan - 06/29/18 1139    Clinical Impression Statement  Pt presents with decreased gait speed with rollator and needs rest periods during standing exercises and decreased standing tolerance and LE weakness and requires frequent verbal cues for upright posture and education provided on relationship between posture and gait. Patient demonstrates instability with therapeutic exercises and balance exercises but tolerates all interventions well. Patient will benefit from continued skilled PT interventions for improved balance, posture, strength, and QOL.    Rehab Potential  Good    PT Frequency  1x / week    PT Duration  8 weeks    PT Treatment/Interventions  Gait training;Therapeutic exercise;Therapeutic activities;Functional mobility training;Neuromuscular re-education;Patient/family education;Balance training    PT Next Visit Plan  Review recent HEP, make sure its working  out well.     PT Home Exercise Plan  see atached document     Consulted and Agree with  Plan of Care  Patient       Patient will benefit from skilled therapeutic intervention in order to improve the following deficits and impairments:  Abnormal gait, Decreased balance, Decreased endurance, Postural dysfunction, Impaired flexibility, Decreased strength, Decreased safety awareness, Decreased activity tolerance, Decreased range of motion, Decreased knowledge of precautions, Impaired sensation, Difficulty walking  Visit Diagnosis: Muscle weakness (generalized)  Other lack of coordination  Difficulty in walking, not elsewhere classified     Problem List Patient Active Problem List   Diagnosis Date Noted  . Lower extremity edema 04/15/2017  . Macrocytic anemia 02/11/2017  . Vitamin D deficiency 02/02/2016  . Mixed hyperlipidemia 01/03/2015  . MS (multiple sclerosis) ([place]) 01/03/2015  . Chronic pain 01/03/2015  . GERD without esophagitis 01/03/2015  . COPD (chronic obstructive pulmonary disease) ([place]) 01/03/2015  . Osteopenia 01/03/2015  . Neurogenic bladder 01/03/2015  . Incontinence in female 01/03/2015    Alanson Puls, PT DPT 06/29/2018, 12:19 PM  [place] MAIN [facility] SERVICES [address] [place], [place], [address] Phone: [phone]   Fax:  [phone]  Name: [name] MRN: [id number] Date of Birth: Nov 11, 1955

## 2018-07-01 ENCOUNTER — Ambulatory Visit: Payer: Medicare Other | Admitting: Occupational Therapy

## 2018-07-01 ENCOUNTER — Encounter: Payer: Self-pay | Admitting: Physical Therapy

## 2018-07-01 ENCOUNTER — Encounter: Payer: Self-pay | Admitting: Occupational Therapy

## 2018-07-01 ENCOUNTER — Ambulatory Visit: Payer: Medicare Other | Admitting: Physical Therapy

## 2018-07-01 DIAGNOSIS — R278 Other lack of coordination: Secondary | ICD-10-CM

## 2018-07-01 DIAGNOSIS — M6281 Muscle weakness (generalized): Secondary | ICD-10-CM

## 2018-07-01 DIAGNOSIS — R262 Difficulty in walking, not elsewhere classified: Secondary | ICD-10-CM

## 2018-07-01 NOTE — Therapy (Signed)
[place] [facility] MAIN [facility] SERVICES [address] [place], [place], [address] Phone: [phone]   Fax:  [phone]  Occupational Therapy Treatment  Patient Details  Name: [name] MRN: [id number] Date of Birth: 1956/04/05 No data recorded  Encounter Date: 07/01/2018  OT End of Session - 07/01/18 1138    Visit Number  56    Number of Visits  72    Date for OT Re-Evaluation  07/01/17    Authorization Type  Visit 8 of 10 for progress report staring 06/02/2018    OT Start Time  1100    OT Stop Time  1145    OT Time Calculation (min)  45 min    Equipment Utilized During Treatment  magnifying glass    Activity Tolerance  Treatment limited secondary to medical complications (Comment)    Behavior During Therapy  [facility] for tasks assessed/performed       Past Medical History:  Diagnosis Date  . Headache   . Hyperlipidemia   . MS (multiple sclerosis) (HCC) 1990  . Neurogenic bladder     Past Surgical History:  Procedure Laterality Date  . ABDOMINAL HYSTERECTOMY    . OOPHORECTOMY Bilateral     There were no vitals filed for this visit.  Subjective Assessment - 07/01/18 1133    Subjective   Pt. reports eeling well today.    Pertinent History  Patient reports she was diagnosed with MS years ago and has recently started to feel weak in her left dominant arm and hand.  She denies any previous therapy and reports memory issues and can provide a limited history.     Patient Stated Goals  Patient reports she wants to be able to walk better, use her left hand and be able to write again.     Currently in Pain?  Yes    Pain Score  4     Pain Location  --   LUE, and LE pain.   Pain Descriptors / Indicators  Aching    Pain Type  Chronic pain      OT TREATMENT    Selfcare:  Pt. worked on simulated medication management tasks. Pt. was able to problem-solve through setting up a large am/pm pillbox for 1 week following a list of instructions with   25% accuracy. Pt. required excessive cues, assist to read the instructions, and assist to sort the colors.                          OT Education - 07/01/18 1137    Education provided  Yes    Education Details  Left hand [facility] skills, reaching skills    Methods  Explanation;Demonstration;Verbal cues    Comprehension  Verbalized understanding;Verbal cues required;Returned demonstration          OT Santana Term Goals - 05/28/18 1307      OT Gillean TERM GOAL #1   Title  Patient will demonstrate holding utensils in her left hand and feeding herself with modified independence with minimal spillage.      Baseline  04/09/2018: Pt spills >50% of food when using L hand. Pt is able to cut soft meats while using her fork.    Time  8    Period  Weeks    Status  Achieved      OT Swiger TERM GOAL #2   Title  Patient will complete tub/shower transfer with supervision only.    Baseline  04/09/2018 Pt requires min A to lift legs over tub for transfers    Time  8    Period  Weeks    Status  On-going      OT Schriner TERM GOAL #4   Title  Patient will increase left UE strength by 1 mm grade to be able to reach into closet to retrieve clothing items.     Baseline  04/09/2018 - Pt LUE strength 4-/5. Pt can reach items in her closet independently    Time  12    Period  Weeks    Status  On-going      OT Franzel TERM GOAL #5   Title  Patient will improve coordination on the left to be able to hold pen securely and write her name with 75% legibility.     Baseline  04/09/2018 - Pt prints name with ~60% legibility using standard pen with a grip    Time  12    Period  Weeks    Status  On-going      OT Mitten TERM GOAL #6   Title  Patient will complete lower body dressing with modified independence using adaptive equipment if needed.     Baseline  04/09/2018 - Pt requires min A to don pants. Pt requires increased time to tie shoes independently. 05/28/18 modified independent    Time  12     Period  Weeks    Status  Achieved            Plan - 07/01/18 1139    Clinical Impression Statement  Pt. is grieving her nephew who recently pased away. Pt. has not been able to read his funeral celebration of life pamphlet, and was upset about this. The first portion of the session today was spent reading  the pamplet to the patient, and providing emotional support, as pt. was tearful. Pt. continues to present with limited LUE strength, and [facility] skills in order to improve ADLs, and IADL tasks.   Occupational Profile and client history currently impacting functional performance  patient is legally blind, progressive disease process with MS for over 20+ years, lives alone, limited availability for transportation.      Occupational performance deficits (Please refer to evaluation for details):  ADL's;Leisure    Rehab Potential  Good    OT Frequency  2x / week    OT Duration  12 weeks    OT Treatment/Interventions  Therapeutic exercise;Cognitive remediation/compensation;Functional Mobility Training    Plan  Positive:  motivation, family support.  Negative:  lives alone, legally blind, requires caregiver 7 days a week for a few hours a day for self care    Clinical Decision Making  Several treatment options, min-mod task modification necessary    Consulted and Agree with Plan of Care  Patient       Patient will benefit from skilled therapeutic intervention in order to improve the following deficits and impairments:  Decreased cognition, Decreased knowledge of use of DME, Impaired vision/preception, Pain, Decreased coordination, Decreased activity tolerance, Decreased endurance, Decreased strength, Decreased balance, Impaired UE functional use  Visit Diagnosis: Muscle weakness (generalized)  Other lack of coordination    Problem List Patient Active Problem List   Diagnosis Date Noted  . Lower extremity edema 04/15/2017  . Macrocytic anemia 02/11/2017  . Vitamin D deficiency 02/02/2016   . Mixed hyperlipidemia 01/03/2015  . MS (multiple sclerosis) (HCC) 01/03/2015  . Chronic pain 01/03/2015  . GERD without esophagitis 01/03/2015  . COPD (chronic  obstructive pulmonary disease) (HCC) 01/03/2015  . Osteopenia 01/03/2015  . Neurogenic bladder 01/03/2015  . Incontinence in female 01/03/2015    Olegario Messier 07/01/2018, 1:09 PM  [place] [facility] MAIN [facility] SERVICES [address] [place], [place], [address] Phone: [phone]   Fax:  [phone]  Name: [name] MRN: [id number] Date of Birth: 12-Sep-1955

## 2018-07-01 NOTE — Therapy (Signed)
[place] MAIN [facility] SERVICES [address] [place], [place], [address] Phone: [phone]   Fax:  [phone]  Physical Therapy Treatment  Patient Details  Name: [name] MRN: [id number] Date of Birth: 28-Jan-1956 Referring Provider (PT): Anabel Bene   Encounter Date: 07/01/2018  PT End of Session - 07/01/18 1202    Visit Number  37    Number of Visits  49    Date for PT Re-Evaluation  07/23/18    Authorization Type  6/10    PT Start Time  1145    PT Stop Time  1225    PT Time Calculation (min)  40 min    Equipment Utilized During Treatment  Gait belt    Activity Tolerance  Patient tolerated treatment well    Behavior During Therapy  WFL for tasks assessed/performed       Past Medical History:  Diagnosis Date  . Headache   . Hyperlipidemia   . MS (multiple sclerosis) ([place]) 1990  . Neurogenic bladder     Past Surgical History:  Procedure Laterality Date  . ABDOMINAL HYSTERECTOMY    . OOPHORECTOMY Bilateral     There were no vitals filed for this visit.  Subjective Assessment - 07/01/18 1201    Subjective  Patient is doing well , no reports of pain.    Pertinent History  Patient reports about 3 months ago she started to feel weak in the left arm, reports she is legally blind.       How [place] can you stand comfortably?  not too [place]     Patient Stated Goals  Patient wants to be able to walk better.     Pain Score  0-No pain    Pain Onset  More than a month ago       Ther-ex  Standing hip abd with 2# ankle weight x 15 BLE and  BUE support  Standing hip extension with 2# ankle weight x 10 BLE with BUE support  Standing hip flexion marches with 2# ankle weight x 10 BLE with BUE support  Standing HS curls with 2# ankle weight x 10 BLE with BUE support  Standing heel raises x 15 with 2# ankle weights  LAQ with 2# ankle weights with 3 second holds x10 each LE  STS from chair without UE support 2x10  Mini squats x10  with no UE support      Neuromuscular Re-education  Toe taps to 6" step without UE support alternating LE x15 each  Normal stance on Airex pad 2x60sec (requires minA for falls recovery 2 times)?  Forward and backward stepping over hurdle x15 each direction  SIde stepping over hurdle 15x each direction    CGA and mod verbal cues used throughout with increased in postural sway and LOB most seen with narrow base of support and while on uneven surfaces. Continues to have balance deficits typical with diagnosis. Patient performs intermediate level exercises without pain behaviors and needs verbal cuing for postural alignment and head positioning                     PT Education - 07/01/18 1201    Education Details  HEP    Person(s) Educated  Patient    Methods  Explanation    Comprehension  Verbalized understanding;Need further instruction       PT Short Term Goals - 05/28/18 1416      PT SHORT TERM GOAL #1  Title  Patient will be independent in home exercise program to improve strength/mobility for better functional independence with ADLs.    Baseline  reports that she is not doing a home exercise program;     Time  4    Period  Weeks    Status  Not Met    Target Date  06/25/18      PT SHORT TERM GOAL #2   Title  Pt (< 70 years old) will complete five times sit to stand test in < 10 seconds indicating an increased LE strength and improved balance.    Baseline  02/11/18= 19.38; 03/16/18: 18.0s,  05/13/18=18.06 sec; 05/28/18: 18.59 sec    Time  4    Period  Weeks    Status  Not Met    Target Date  06/25/18        PT Olenik Term Goals - 06/29/18 1200      PT Bethard TERM GOAL #1   Title  Patient will increase six minute walk test distance to >1000 for progression to community ambulator and improve gait ability    Baseline  02/11/18= 500 feet; 03/16/18: 830 feet,  04/01/18= 515,05/13/18 605 ft; 05/28/18: 815 feet,  06/29/18=830 ft    Time  8    Period  Weeks    Status   Partially Met    Target Date  07/23/18      PT Scheaffer TERM GOAL #2   Title  Patient will increase 10 meter walk test to >1.23ms as to improve gait speed for better community ambulation and to reduce fall risk.    Baseline  02/11/18 . 68 m/sec; 03/16/18: self-selected: 12.6s= 0.79 m/s fastest: 10.4s = 0.96 m/s, 04/01/18= .1.0 m/sec,05/13/18; 05/28/18: 0.89 m/s with rollator 06/29/18=1.02 m/sec    Time  8    Period  Weeks    Status  Not Met    Target Date  07/23/18      PT Tarr TERM GOAL #3   Title  Patient will reduce timed up and go to <11 seconds to reduce fall risk and demonstrate improved transfer/gait ability.    Baseline  17.49 sec 02/11/18; 03/16/18: 16.8s, 04/01/18= 15.9 sec, 05/13/18 14.87 sec; 05/28/18: 18.8 sec, 06/29/18=17.60 sec    Time  8    Period  Weeks    Status  Not Met    Target Date  07/23/18      PT Edmiston TERM GOAL #4   Title  Patient (> 61years old) will complete five times sit to stand test in < 15 seconds indicating an increased LE strength and improved balance.    Baseline  19.38sec 02/11/18; 03/16/18: 18.0s, 04/01/18= 17.04 sec, 05/13/18= 18.06 sec1/6/20=18.65    Time  8    Period  Weeks    Status  Partially Met    Target Date  07/23/18            Plan - 07/01/18 1203    Clinical Impression Statement  Patient demonstrates LOB with standing balance exercises indicating decreased balancing strategies. Patient did require UE support to perform sidestepping up and over exercise. Patient will benefit from further skilled therapy to return to prior level of function. .  Pt was encouraged to perform HEP during the week in order to continue progressing balance and strength interventions.  Pt would continue to benefit from skilled therapy services in order to further address LE strength deficits and balance deficits in order to decrease fall risk and improve mobility  Rehab Potential  Good    PT Frequency  1x / week    PT Duration  8 weeks    PT Treatment/Interventions  Gait  training;Therapeutic exercise;Therapeutic activities;Functional mobility training;Neuromuscular re-education;Patient/family education;Balance training    PT Next Visit Plan  Review recent HEP, make sure its working out well.     PT Home Exercise Plan  see atached document     Consulted and Agree with Plan of Care  Patient       Patient will benefit from skilled therapeutic intervention in order to improve the following deficits and impairments:  Abnormal gait, Decreased balance, Decreased endurance, Postural dysfunction, Impaired flexibility, Decreased strength, Decreased safety awareness, Decreased activity tolerance, Decreased range of motion, Decreased knowledge of precautions, Impaired sensation, Difficulty walking  Visit Diagnosis: Muscle weakness (generalized)  Other lack of coordination  Difficulty in walking, not elsewhere classified     Problem List Patient Active Problem List   Diagnosis Date Noted  . Lower extremity edema 04/15/2017  . Macrocytic anemia 02/11/2017  . Vitamin D deficiency 02/02/2016  . Mixed hyperlipidemia 01/03/2015  . MS (multiple sclerosis) ([place]) 01/03/2015  . Chronic pain 01/03/2015  . GERD without esophagitis 01/03/2015  . COPD (chronic obstructive pulmonary disease) ([place]) 01/03/2015  . Osteopenia 01/03/2015  . Neurogenic bladder 01/03/2015  . Incontinence in female 01/03/2015    Alanson Puls, PT DPT 07/01/2018, 12:21 PM  [place] MAIN [facility] SERVICES [address] [place], [place], [address] Phone: [phone]   Fax:  [phone]  Name: [name] MRN: [id number] Date of Birth: [birth year]

## 2018-07-07 ENCOUNTER — Ambulatory Visit: Payer: Medicare Other | Admitting: Occupational Therapy

## 2018-07-07 ENCOUNTER — Ambulatory Visit: Payer: Medicare Other

## 2018-07-07 ENCOUNTER — Encounter: Payer: Self-pay | Admitting: Occupational Therapy

## 2018-07-07 DIAGNOSIS — R262 Difficulty in walking, not elsewhere classified: Secondary | ICD-10-CM

## 2018-07-07 DIAGNOSIS — R278 Other lack of coordination: Secondary | ICD-10-CM | POA: Diagnosis not present

## 2018-07-07 DIAGNOSIS — M6281 Muscle weakness (generalized): Secondary | ICD-10-CM

## 2018-07-07 NOTE — Therapy (Signed)
[place] [facility] MAIN [facility] SERVICES [address] [place], [place], [address] Phone: [phone]   Fax:  [phone]  Occupational Therapy Treatment/Recertification Note/10th Visit Progress Note  Patient Details  Name: [name] MRN: [id number] Date of Birth: [birth year] No data recorded  Encounter Date: 07/07/2018  OT End of Session - 07/07/18 1338    Visit Number  57    Number of Visits  72    Date for OT Re-Evaluation  09/29/18    Authorization Type  Visit 9 of 10 for progress report starting 06/02/2018    OT Start Time  1330    OT Stop Time  1415    OT Time Calculation (min)  45 min    Equipment Utilized During Treatment  magnifying glass    Activity Tolerance  Treatment limited secondary to medical complications (Comment)    Behavior During Therapy  [facility] for tasks assessed/performed       Past Medical History:  Diagnosis Date  . Headache   . Hyperlipidemia   . MS (multiple sclerosis) (HCC) 1990  . Neurogenic bladder     Past Surgical History:  Procedure Laterality Date  . ABDOMINAL HYSTERECTOMY    . OOPHORECTOMY Bilateral     Measurements were obtained.  Subjective Assessment - 07/07/18 1612    Subjective   Pt. reports she is strating to feel better.    Currently in Pain?  Yes    Pain Score  4     Pain Location  Leg    Pain Orientation  Left    Pain Descriptors / Indicators  Aching      OT TREATMENT    Neuro muscular re-education:  Pt. Initially attempted [facility] skills manipulating 1/2" flat washers, and moving them through her hand to place them onto hooks. Pt. had difficulty with this, and required the task to be modified using magnetic pegs. Pt. worked on [facility] grasping 1/2" small magnetic pegs, disconnecting them, and placing them on a positioned at a vertical angle at a tabletop, on an elevated angle to encourage [facility] skills with arms sustained in elevation. Pt. worked on removing the pegs while alternating thumb  opposition to the tip of the 2nd through 5th digits. Pt. required verbal cues and assist with sorting peg colors.       [facility] OT Assessment - 07/07/18 1358      Coordination   Left 9 Hole Peg Test  51 sec.      Strength   Overall Strength Comments  RUE 5/5, LUE 4/5      Hand Function   Left Hand Grip (lbs)  42    Left Hand Lateral Pinch  18 lbs    Left 3 point pinch  12 lbs         OT Erman Term Goals - 07/07/18 1411      OT Gajda TERM GOAL #1   Title  Patient will demonstrate holding utensils in her left hand and feeding herself with modified independence with minimal spillage.      Baseline  04/09/2018: Pt spills >50% of food when using L hand. Pt is able to cut soft meats while using her fork.    Time  8    Period  Weeks    Status  Achieved      OT Gehret TERM GOAL #2   Title  Patient will complete tub/shower transfer with supervision only.    Baseline  04/09/2018 Pt requires min A to lift legs  over tub for transfers    Time  8    Period  Weeks    Status  Deferred      OT Hunn[place]utt TERM GOAL #3   Title  Pt. will independently opens packets, and bags indpendently    Baseline  MinA    Time  12    Period  Weeks    Status  New    Target Date  09/29/18      OT Difrancesco TERM GOAL #4   Title  Patient will increase left UE strength by 1 mm grade to be able to reach into closet to retrieve clothing items.     Baseline  07/07/2018: Pt LUE strength 4/5. Pt can reach items in her closet independently    Time  12    Period  Weeks    Status  On-going    Target Date  09/29/18      OT Sabo TERM GOAL #5   Title  Patient will improve coordination on the left to be able to hold pen securely and write her name with 75% legibility.     Baseline  07/07/2018: Pt prints name with 60% legibility, cursive words with 25% legibility using standard pen with a grip    Time  12    Period  Weeks    Status  On-going    Target Date  09/29/18      OT Stroope TERM GOAL #6   Title  Patient will complete  lower body dressing with modified independence using adaptive equipment if needed.     Baseline  04/09/2018 - Pt requires min A to don pants. Pt requires increased time to tie shoes independently. 05/28/18 modified independent    Time  12    Period  Weeks    Status  Achieved      OT Stiver TERM GOAL #7   Title  Pt. will independently put her earrings in.    Baseline  07/07/2018: Pt. is unable, and has difficulty putting her earrings in.    Time  12    Period  Weeks    Status  New    Target Date  09/29/18      OT Better TERM GOAL #8   Title  Pt. will be indpendent with using her left hand to apply her lipstick.    Baseline  07/07/2018: Pt. is unable  to complete    Time  12    Period  Weeks    Status  New    Target Date  09/29/18      OT Stobaugh TERM GOAL  #9   Baseline  Pt. will independently open containers, and jars.    Time  12    Period  Weeks    Status  New    Target Date  09/29/18                      OT Education - 07/07/18 1336    Education provided  Yes    Education Details  Left hand [facility] skills, reaching skills    Person(s) Educated  Patient    Comprehension  Verbalized understanding;Verbal cues required;Returned demonstration                     Plan - 07/07/18 1342    Clinical Impression Statement  Pt. is making steady progress. Pt. continues to present with left sided weakness, limited motor control, and left hand [facility] skills. Pt. continues  to work on improving left UE functioning,  LUE strength, and [facility] coordination skills in order to be able to open containers, packets, put earrings on, apply lipstick with her left hand, write legibly, and perform ADLs, IADLs.      Occupational Profile and client history currently impacting functional performance  patient is legally blind, progressive disease process with MS for over 20+ years, lives alone, limited availability for transportation.      Occupational performance deficits (Please refer to  evaluation for details):  ADL's;Leisure    Rehab Potential  Good    OT Frequency  2x / week    OT Duration  12 weeks    OT Treatment/Interventions  Therapeutic exercise;Cognitive remediation/compensation;Functional Mobility Training    Plan  Positive:  motivation, family support.  Negative:  lives alone, legally blind, requires caregiver 7 days a week for a few hours a day for self care    Clinical Decision Making  Several treatment options, min-mod task modification necessary    Consulted and Agree with Plan of Care  Patient       Patient will benefit from skilled therapeutic intervention in order to improve the following deficits and impairments:  Decreased cognition, Decreased knowledge of use of DME, Impaired vision/preception, Pain, Decreased coordination, Decreased activity tolerance, Decreased endurance, Decreased strength, Decreased balance, Impaired UE functional use  Visit Diagnosis: Muscle weakness (generalized)  Other lack of coordination    Problem List Patient Active Problem List   Diagnosis Date Noted  . Lower extremity edema 04/15/2017  . Macrocytic anemia 02/11/2017  . Vitamin D deficiency 02/02/2016  . Mixed hyperlipidemia 01/03/2015  . MS (multiple sclerosis) (HCC) 01/03/2015  . Chronic pain 01/03/2015  . GERD without esophagitis 01/03/2015  . COPD (chronic obstructive pulmonary disease) (HCC) 01/03/2015  . Osteopenia 01/03/2015  . Neurogenic bladder 01/03/2015  . Incontinence in female 01/03/2015    Olegario Messier, MS, OTR/L 07/07/2018, 4:16 PM  [place] [facility] MAIN [facility] SERVICES [address] [place], [place], [address] Phone: [phone]   Fax:  [phone]  Name: [name] MRN: [id number] Date of Birth: Mar 20, 1956

## 2018-07-07 NOTE — Therapy (Signed)
[place] MAIN [facility] SERVICES [address] [place], [place], [address] Phone: [phone]   Fax:  [phone]  Physical Therapy Treatment  Patient Details  Name: [name] MRN: [id number] Date of Birth: 1955/10/19 Referring Provider (PT): Anabel Bene   Encounter Date: 07/07/2018  PT End of Session - 07/07/18 1437    Visit Number  38    Number of Visits  49    Date for PT Re-Evaluation  07/23/18    PT Start Time  1330    PT Stop Time  1415    PT Time Calculation (min)  45 min    Equipment Utilized During Treatment  Gait belt    Activity Tolerance  Patient tolerated treatment well    Behavior During Therapy  WFL for tasks assessed/performed       Past Medical History:  Diagnosis Date  . Headache   . Hyperlipidemia   . MS (multiple sclerosis) ([place]) 1990  . Neurogenic bladder     Past Surgical History:  Procedure Laterality Date  . ABDOMINAL HYSTERECTOMY    . OOPHORECTOMY Bilateral     There were no vitals filed for this visit.  Subjective Assessment - 07/07/18 1435    Subjective  Patient is doing well , no reports of pain. No falls since last visit. No specific questions or concerns.     Pertinent History  Patient reports about 3 months ago she started to feel weak in the left arm, reports she is legally blind.       How Bergdoll can you stand comfortably?  not too Lap[place]     Patient Stated Goals  Patient wants to be able to walk better.     Currently in Pain?  No/denies            TREATMENT  Ther-ex  Octane warm-up x 5 minutes (unbilled) Mini squats x 15 with no UE support  Seated clams with manual resistance x 15; Seated adductor squeeze with 2# ankle weight x 15; Seated LAQ 2# ankle weight x 15 bilateral; Standing hip abd with 2# ankle weight x 15 BLE and  BUE support  Standing hip extension with 2# ankle weight x 15 BLE with BUE support  Standing hip flexion marches with 2# ankle weight x 15 BLE with BUE  support  Standing HS curls with 2# ankle weight x 15 BLE with BUE support  Standing heel raises x 15 with 2# ankle weights  STS from chair without UE support 2 x 10;   Neuromuscular Re-education  Toe taps to 6" step without UE support alternating LE x 10 each  1/2 foam roll balance WBOS in A/P direction x 60 seconds; WBOS on Airex pad with ball passes around body with head/eye follow x 10 each direction, she requires minA for falls recovery 2 times)? Forward and backward walking in // bars x 3 lengths each; Side stepping in // bars x 3 lengths each direction; 6" hurdle steps x 10 each;   Pt educated throughout session about proper posture and technique with exercises. Improved exercise technique, movement at target joints, use of target muscles after min to mod verbal, visual, tactile cues.     Patient demonstrates high fear of falling and some frustration during session. She reports that she wants to get better but then reports "I'm working too hard today." Pt is able to complete all exercises as instructed but requires encouragement as well as sitting rest breaks. Patient did require  intermittent UE support during balance activities. Pt was encouraged to perform HEP during the week in order to continue progressing balance and strength interventions. Pt would continue to benefit from skilled therapy services in order to further address LE strength deficits and balance deficits in order to decrease fall risk and improve mobility                      PT Short Term Goals - 05/28/18 1416      PT SHORT TERM GOAL #1   Title  Patient will be independent in home exercise program to improve strength/mobility for better functional independence with ADLs.    Baseline  reports that she is not doing a home exercise program;     Time  4    Period  Weeks    Status  Not Met    Target Date  06/25/18      PT SHORT TERM GOAL #2   Title  Pt (< 67 years old) will complete five times  sit to stand test in < 10 seconds indicating an increased LE strength and improved balance.    Baseline  02/11/18= 19.38; 03/16/18: 18.0s,  05/13/18=18.06 sec; 05/28/18: 18.59 sec    Time  4    Period  Weeks    Status  Not Met    Target Date  06/25/18        PT Feggins Term Goals - 06/29/18 1200      PT Balzarini TERM GOAL #1   Title  Patient will increase six minute walk test distance to >1000 for progression to community ambulator and improve gait ability    Baseline  02/11/18= 500 feet; 03/16/18: 830 feet,  04/01/18= 515,05/13/18 605 ft; 05/28/18: 815 feet,  06/29/18=830 ft    Time  8    Period  Weeks    Status  Partially Met    Target Date  07/23/18      PT Hobday TERM GOAL #2   Title  Patient will increase 10 meter walk test to >1.20ms as to improve gait speed for better community ambulation and to reduce fall risk.    Baseline  02/11/18 . 68 m/sec; 03/16/18: self-selected: 12.6s= 0.79 m/s fastest: 10.4s = 0.96 m/s, 04/01/18= .1.0 m/sec,05/13/18; 05/28/18: 0.89 m/s with rollator 06/29/18=1.02 m/sec    Time  8    Period  Weeks    Status  Not Met    Target Date  07/23/18      PT Rybka TERM GOAL #3   Title  Patient will reduce timed up and go to <11 seconds to reduce fall risk and demonstrate improved transfer/gait ability.    Baseline  17.49 sec 02/11/18; 03/16/18: 16.8s, 04/01/18= 15.9 sec, 05/13/18 14.87 sec; 05/28/18: 18.8 sec, 06/29/18=17.60 sec    Time  8    Period  Weeks    Status  Not Met    Target Date  07/23/18      PT Ellenburg TERM GOAL #4   Title  Patient (> 63years old) will complete five times sit to stand test in < 15 seconds indicating an increased LE strength and improved balance.    Baseline  19.38sec 02/11/18; 03/16/18: 18.0s, 04/01/18= 17.04 sec, 05/13/18= 18.06 sec1/6/20=18.65    Time  8    Period  Weeks    Status  Partially Met    Target Date  07/23/18            Plan - 07/07/18 1437  Clinical Impression Statement  Patient demonstrates high fear of falling and some frustration  during session. She reports that she wants to get better but then reports "I'm working too hard today." Pt is able to complete all exercises as instructed but requires encouragement as well as sitting rest breaks. Patient did require intermittent UE support during balance activities. Pt was encouraged to perform HEP during the week in order to continue progressing balance and strength interventions. Pt would continue to benefit from skilled therapy services in order to further address LE strength deficits and balance deficits in order to decrease fall risk and improve mobility    Rehab Potential  Good    PT Frequency  1x / week    PT Duration  8 weeks    PT Treatment/Interventions  Gait training;Therapeutic exercise;Therapeutic activities;Functional mobility training;Neuromuscular re-education;Patient/family education;Balance training    PT Next Visit Plan  Review recent HEP, make sure its working out well.     PT Home Exercise Plan  see atached document     Consulted and Agree with Plan of Care  Patient       Patient will benefit from skilled therapeutic intervention in order to improve the following deficits and impairments:  Abnormal gait, Decreased balance, Decreased endurance, Postural dysfunction, Impaired flexibility, Decreased strength, Decreased safety awareness, Decreased activity tolerance, Decreased range of motion, Decreased knowledge of precautions, Impaired sensation, Difficulty walking  Visit Diagnosis: Muscle weakness (generalized)  Other lack of coordination  Difficulty in walking, not elsewhere classified     Problem List Patient Active Problem List   Diagnosis Date Noted  . Lower extremity edema 04/15/2017  . Macrocytic anemia 02/11/2017  . Vitamin D deficiency 02/02/2016  . Mixed hyperlipidemia 01/03/2015  . MS (multiple sclerosis) ([place]) 01/03/2015  . Chronic pain 01/03/2015  . GERD without esophagitis 01/03/2015  . COPD (chronic obstructive pulmonary disease)  ([place]) 01/03/2015  . Osteopenia 01/03/2015  . Neurogenic bladder 01/03/2015  . Incontinence in female 01/03/2015   Phillips Grout PT, DPT, GCS  , 07/08/2018, 12:59 PM  [place] MAIN [facility] SERVICES [address] [place], [place], [address] Phone: [phone]   Fax:  [phone]  Name: [name] MRN: [id number] Date of Birth: January 09, 1956

## 2018-07-08 DIAGNOSIS — G8929 Other chronic pain: Secondary | ICD-10-CM | POA: Diagnosis not present

## 2018-07-08 DIAGNOSIS — M545 Low back pain: Secondary | ICD-10-CM | POA: Diagnosis not present

## 2018-07-08 DIAGNOSIS — G35 Multiple sclerosis: Secondary | ICD-10-CM | POA: Diagnosis not present

## 2018-07-09 ENCOUNTER — Encounter: Payer: Self-pay | Admitting: Occupational Therapy

## 2018-07-09 ENCOUNTER — Ambulatory Visit: Payer: Medicare Other | Admitting: Occupational Therapy

## 2018-07-09 ENCOUNTER — Ambulatory Visit: Payer: Medicare Other | Admitting: Physical Therapy

## 2018-07-09 ENCOUNTER — Other Ambulatory Visit: Payer: Self-pay | Admitting: Family Medicine

## 2018-07-09 DIAGNOSIS — G35 Multiple sclerosis: Secondary | ICD-10-CM

## 2018-07-09 DIAGNOSIS — R262 Difficulty in walking, not elsewhere classified: Secondary | ICD-10-CM | POA: Diagnosis not present

## 2018-07-09 DIAGNOSIS — R278 Other lack of coordination: Secondary | ICD-10-CM | POA: Diagnosis not present

## 2018-07-09 DIAGNOSIS — E559 Vitamin D deficiency, unspecified: Secondary | ICD-10-CM

## 2018-07-09 DIAGNOSIS — G894 Chronic pain syndrome: Secondary | ICD-10-CM

## 2018-07-09 DIAGNOSIS — R32 Unspecified urinary incontinence: Secondary | ICD-10-CM

## 2018-07-09 DIAGNOSIS — K219 Gastro-esophageal reflux disease without esophagitis: Secondary | ICD-10-CM

## 2018-07-09 DIAGNOSIS — E782 Mixed hyperlipidemia: Secondary | ICD-10-CM

## 2018-07-09 DIAGNOSIS — N319 Neuromuscular dysfunction of bladder, unspecified: Secondary | ICD-10-CM

## 2018-07-09 DIAGNOSIS — M6281 Muscle weakness (generalized): Secondary | ICD-10-CM | POA: Diagnosis not present

## 2018-07-09 DIAGNOSIS — J432 Centrilobular emphysema: Secondary | ICD-10-CM

## 2018-07-09 DIAGNOSIS — M8589 Other specified disorders of bone density and structure, multiple sites: Secondary | ICD-10-CM

## 2018-07-09 NOTE — Therapy (Signed)
[place] [facility] [address] [place], [place], [address] Phone: [phone]   Fax:  [phone]  Occupational Therapy Treatment  Patient Details  Name: [name] MRN: [id number] Date of Birth: 05-19-1956 No data recorded  Encounter Date: 07/09/2018  OT End of Session - 07/09/18 1121    Visit Number  58    Number of Visits  72    Date for OT Re-Evaluation  09/29/18    Authorization Type  Progress report period staring 06/02/2018    OT Start Time  1100    OT Stop Time  1145    OT Time Calculation (min)  45 min    Equipment Utilized During Treatment  magnifying glass    Activity Tolerance  Treatment limited secondary to medical complications (Comment)    Behavior During Therapy  [facility] for tasks assessed/performed       Past Medical History:  Diagnosis Date  . Headache   . Hyperlipidemia   . MS (multiple sclerosis) ([facility]) 1990  . Neurogenic bladder     Past Surgical History:  Procedure Laterality Date  . ABDOMINAL HYSTERECTOMY    . OOPHORECTOMY Bilateral     There were no vitals filed for this visit.  Subjective Assessment - 07/09/18 1112    Subjective   Pt. reports that she is starting to feel better.     Pertinent History  Patient reports she was diagnosed with MS years ago and has recently started to feel weak in her left dominant arm and hand.  She denies any previous therapy and reports memory issues and can provide a limited history.     Patient Stated Goals  Patient reports she wants to be able to walk better, use her left hand and be able to write again.     Currently in Pain?  Yes    Pain Score  4     Pain Location  Leg    Pain Orientation  Left    Pain Descriptors / Indicators  Aching    Pain Type  Chronic pain    Pain Onset  More than a month ago      OT TREATMENT    Neuro muscular re-education:  Pt. worked on grasping, and manipulating 1/2" washers with her left hand from a magnetic dish using  point grasp pattern. Pt. worked on reaching up, stabilizing, and sustaining shoulder elevation while placing the washer over a small precise target on vertical dowels positioned at various vertical, and diagonal angles. Pt. worked on grasping, and positioning magnetic hooks on a whiteboard positioned at a vertical angle on the tabletop. Pt. worked on [facility] skills grasping 1/2" flat washers, and placing them on the hooks. Pt. Worked on removing the washers one, at a time, and storing them in the palm of her hand.  Pt. Worked on grasping coins from a tabletop surface, placing them into a resistive container, and pushing them through the slot while isolating her 2nd digit.                          OT Education - 07/09/18 1121    Education provided  Yes    Education Details  Left hand FMC skills, reaching skills    Person(s) Educated  Patient    Methods  Explanation;Demonstration;Verbal cues    Comprehension  Verbalized understanding;Verbal cues required;Returned demonstration          OT Petrosky Term Goals -  07/07/18 1411      OT Palmeri TERM GOAL #1   Title  Patient will demonstrate holding utensils in her left hand and feeding herself with modified independence with minimal spillage.      Baseline  04/09/2018: Pt spills >50% of food when using L hand. Pt is able to cut soft meats while using her fork.    Time  8    Period  Weeks    Status  Achieved      OT Inlow TERM GOAL #2   Title  Patient will complete tub/shower transfer with supervision only.    Baseline  04/09/2018 Pt requires min A to lift legs over tub for transfers    Time  8    Period  Weeks    Status  Deferred      OT Callander TERM GOAL #3   Title  Pt. will independently opens packets, and bags indpendently    Baseline  MinA    Time  12    Period  Weeks    Status  New    Target Date  09/29/18      OT Leduc TERM GOAL #4   Title  Patient will increase left UE strength by 1 mm grade to be able to reach into  closet to retrieve clothing items.     Baseline  07/07/2018: Pt LUE strength 4/5. Pt can reach items in her closet independently    Time  12    Period  Weeks    Status  On-going    Target Date  09/29/18      OT Fetch TERM GOAL #5   Title  Patient will improve coordination on the left to be able to hold pen securely and write her name with 75% legibility.     Baseline  07/07/2018: Pt prints name with 60% legibility, cursive words with 25% legibility using standard pen with a grip    Time  12    Period  Weeks    Status  On-going    Target Date  09/29/18      OT Whipple TERM GOAL #6   Title  Patient will complete lower body dressing with modified independence using adaptive equipment if needed.     Baseline  04/09/2018 - Pt requires min A to don pants. Pt requires increased time to tie shoes independently. 05/28/18 modified independent    Time  12    Period  Weeks    Status  Achieved      OT Grantz TERM GOAL #7   Title  Pt. will independently put her earrings in.    Baseline  07/07/2018: Pt. is unable, and has difficulty putting her earrings in.    Time  12    Period  Weeks    Status  New    Target Date  09/29/18      OT Rosales TERM GOAL #8   Title  Pt. will be indpendent with using her left hand to apply her lipstick.    Baseline  07/07/2018: Pt. is unable  to complete    Time  12    Period  Weeks    Status  New    Target Date  09/29/18      OT Vanhoesen TERM GOAL  #9   Baseline  Pt. will independently open containers, and jars.    Time  12    Period  Weeks    Status  New    Target Date  09/29/18  Plan - 07/09/18 1122    Clinical Impression Statement Pt. has improved with feeding herself, and cutting her food.  Pt. continues to present with limited LUE strength, motor control, and [facility] skills. Pt. presents with increased compensation proximally with leaning to the left when reaching. Pt. continues to work on improving LUE functioning during ADLS, and IADLs.    Occupational  Profile and client history currently impacting functional performance  patient is legally blind, progressive disease process with MS for over 20+ years, lives alone, limited availability for transportation.      Occupational performance deficits (Please refer to evaluation for details):  ADL's;Leisure    Rehab Potential  Good    OT Frequency  2x / week    OT Duration  12 weeks    OT Treatment/Interventions  Therapeutic exercise;Cognitive remediation/compensation;Functional Mobility Training    Plan  Positive:  motivation, family support.  Negative:  lives alone, legally blind, requires caregiver 7 days a week for a few hours a day for self care    Clinical Decision Making  Several treatment options, min-mod task modification necessary    Consulted and Agree with Plan of Care  Patient       Patient will benefit from skilled therapeutic intervention in order to improve the following deficits and impairments:  Decreased cognition, Decreased knowledge of use of DME, Impaired vision/preception, Pain, Decreased coordination, Decreased activity tolerance, Decreased endurance, Decreased strength, Decreased balance, Impaired UE functional use  Visit Diagnosis: Muscle weakness (generalized)  Other lack of coordination    Problem List Patient Active Problem List   Diagnosis Date Noted  . Lower extremity edema 04/15/2017  . Macrocytic anemia 02/11/2017  . Vitamin D deficiency 02/02/2016  . Mixed hyperlipidemia 01/03/2015  . MS (multiple sclerosis) ([facility]) 01/03/2015  . Chronic pain 01/03/2015  . GERD without esophagitis 01/03/2015  . COPD (chronic obstructive pulmonary disease) ([facility]) 01/03/2015  . Osteopenia 01/03/2015  . Neurogenic bladder 01/03/2015  . Incontinence in female 01/03/2015    Olegario Messier, MS, OTR/L 07/09/2018, 11:42 AM  [place] [facility] MAIN [facility] SERVICES [address] [place], [place], [address] Phone: [phone]   Fax:   [phone]  Name: [name] MRN: [id number] Date of Birth: 1955-09-14

## 2018-07-09 NOTE — Telephone Encounter (Signed)
Pt. Caretaker called requesting that ALL medication be sent to  [facility] . Call back # is  [phone]

## 2018-07-10 NOTE — Telephone Encounter (Signed)
Patient was at [employer].  Patient would like all prescription sent to [facility].  She has a follow up appt on 08/19/18.  Please let me know if you are ok with refill all medications you have refilled before and we can send over.  Or if she needs to make her appointment sooner.

## 2018-07-13 MED ORDER — GABAPENTIN 300 MG PO CAPS: 600.0000 mg | ORAL_CAPSULE | Freq: Three times a day (TID) | ORAL | 1 refills | Status: DC

## 2018-07-13 MED ORDER — PRAVASTATIN SODIUM 40 MG PO TABS: 40.0000 mg | ORAL_TABLET | Freq: Every day | ORAL | 1 refills | Status: DC

## 2018-07-13 MED ORDER — ALBUTEROL SULFATE HFA 108 (90 BASE) MCG/ACT IN AERS: INHALATION_SPRAY | RESPIRATORY_TRACT | 3 refills | Status: DC

## 2018-07-13 MED ORDER — ALENDRONATE SODIUM 70 MG PO TABS: 70.0000 mg | ORAL_TABLET | ORAL | 1 refills | Status: DC

## 2018-07-13 MED ORDER — TIOTROPIUM BROMIDE MONOHYDRATE 18 MCG IN CAPS: 18.0000 ug | ORAL_CAPSULE | Freq: Every day | RESPIRATORY_TRACT | 5 refills | Status: DC

## 2018-07-13 MED ORDER — MIRABEGRON ER 50 MG PO TB24: 50.0000 mg | ORAL_TABLET | Freq: Every day | ORAL | 1 refills | Status: DC

## 2018-07-13 MED ORDER — PANTOPRAZOLE SODIUM 40 MG PO TBEC: 40.0000 mg | DELAYED_RELEASE_TABLET | Freq: Every day | ORAL | 1 refills | Status: DC

## 2018-07-13 MED ORDER — ALBUTEROL SULFATE (2.5 MG/3ML) 0.083% IN NEBU: 2.5000 mg | INHALATION_SOLUTION | Freq: Four times a day (QID) | RESPIRATORY_TRACT | 1 refills | Status: AC | PRN

## 2018-07-13 NOTE — Telephone Encounter (Signed)
All appropriate meds reviewed and ordered to [facility] [address]  She can follow-up on 08/19/18  [name], DO [facility] 07/13/2018, 8:14 AM

## 2018-07-14 ENCOUNTER — Encounter: Payer: Self-pay | Admitting: Physical Therapy

## 2018-07-14 ENCOUNTER — Ambulatory Visit: Payer: Medicare Other | Admitting: Occupational Therapy

## 2018-07-14 ENCOUNTER — Encounter: Payer: Self-pay | Admitting: Occupational Therapy

## 2018-07-14 ENCOUNTER — Ambulatory Visit: Payer: Medicare Other | Admitting: Physical Therapy

## 2018-07-14 DIAGNOSIS — R262 Difficulty in walking, not elsewhere classified: Secondary | ICD-10-CM

## 2018-07-14 DIAGNOSIS — M6281 Muscle weakness (generalized): Secondary | ICD-10-CM

## 2018-07-14 DIAGNOSIS — R278 Other lack of coordination: Secondary | ICD-10-CM | POA: Diagnosis not present

## 2018-07-14 NOTE — Therapy (Signed)
[place] [facility] [address] [place], [place], [address] Phone: [phone]   Fax:  [phone]  Occupational Therapy Treatment  Patient Details  Name: [name] MRN: [id number] Date of Birth: January 25, 1956 No data recorded  Encounter Date: 07/14/2018    Past Medical History:  Diagnosis Date  . Headache   . Hyperlipidemia   . MS (multiple sclerosis) ([facility]) 1990  . Neurogenic bladder     Past Surgical History:  Procedure Laterality Date  . ABDOMINAL HYSTERECTOMY    . OOPHORECTOMY Bilateral     OT TREATMENT    Neuro muscular re-education:  Pt. worked on using her left hand for grasping coins from a tabletop surface, placing them into a resistive container, and pushing them through the slot while isolating his 2nd digit. A resistive mat was placed under coins to aide in manipulating the coins and prevent sliding when picking them up. Pt. worked on grasping 1" resistive cubes alternating thumb opposition to the tip of the 2nd through 5th digits while the board is placed at a vertical angle. Pt. worked on pressing the cubes back into place while alternating isolated 2nd through 5th digit extension. Pt. worked on grasping 2" large pegs from a bucket set to her left. Pt. worked on placing the pegs onto an [employer] Devicesnstructo board placed at a tabletop surface. Pt. requires cues to use her left hand during the tasks today.                              OT Education - 07/14/18 1350    Education provided  Yes    Education Details  Left hand [facility] skills, reaching skills    Person(s) Educated  Patient    Methods  Explanation;Demonstration;Verbal cues    Comprehension  Verbalized understanding;Verbal cues required;Returned demonstration          OT Isham Term Goals - 07/07/18 1411      OT Baldwin TERM GOAL #1   Title  Patient will demonstrate holding utensils in her left hand and feeding herself with modified  independence with minimal spillage.      Baseline  04/09/2018: Pt spills >50% of food when using L hand. Pt is able to cut soft meats while using her fork.    Time  8    Period  Weeks    Status  Achieved      OT Hollett TERM GOAL #2   Title  Patient will complete tub/shower transfer with supervision only.    Baseline  04/09/2018 Pt requires min A to lift legs over tub for transfers    Time  8    Period  Weeks    Status  Deferred      OT Schley TERM GOAL #3   Title  Pt. will independently opens packets, and bags indpendently    Baseline  MinA    Time  12    Period  Weeks    Status  New    Target Date  09/29/18      OT Vansickle TERM GOAL #4   Title  Patient will increase left UE strength by 1 mm grade to be able to reach into closet to retrieve clothing items.     Baseline  07/07/2018: Pt LUE strength 4/5. Pt can reach items in her closet independently    Time  12    Period  Weeks    Status  On-going    Target Date  09/29/18      OT Ocasio TERM GOAL #5   Title  Patient will improve coordination on the left to be able to hold pen securely and write her name with 75% legibility.     Baseline  07/07/2018: Pt prints name with 60% legibility, cursive words with 25% legibility using standard pen with a grip    Time  12    Period  Weeks    Status  On-going    Target Date  09/29/18      OT Porcaro TERM GOAL #6   Title  Patient will complete lower body dressing with modified independence using adaptive equipment if needed.     Baseline  04/09/2018 - Pt requires min A to don pants. Pt requires increased time to tie shoes independently. 05/28/18 modified independent    Time  12    Period  Weeks    Status  Achieved      OT Bialy TERM GOAL #7   Title  Pt. will independently put her earrings in.    Baseline  07/07/2018: Pt. is unable, and has difficulty putting her earrings in.    Time  12    Period  Weeks    Status  New    Target Date  09/29/18      OT Markiewicz TERM GOAL #8   Title  Pt. will be  indpendent with using her left hand to apply her lipstick.    Baseline  07/07/2018: Pt. is unable  to complete    Time  12    Period  Weeks    Status  New    Target Date  09/29/18      OT Rinck TERM GOAL  #9   Baseline  Pt. will independently open containers, and jars.    Time  12    Period  Weeks    Status  New    Target Date  09/29/18            Plan - 07/14/18 1354    Clinical Impression Statement Pt. requested  that I call her homehealth caregiver today with an update on her progress. Pt. continues to work on improving LUE strength, and [facility] skills in order to improve ADL, and IADL functioning. Pt. Required increased cues to use her left hand during each task  today initiating with the right.   Occupational Profile and client history currently impacting functional performance  patient is legally blind, progressive disease process with MS for over 20+ years, lives alone, limited availability for transportation.      Occupational performance deficits (Please refer to evaluation for details):  ADL's;Leisure    Rehab Potential  Good    OT Frequency  2x / week    OT Duration  12 weeks    OT Treatment/Interventions  Therapeutic exercise;Cognitive remediation/compensation;Functional Mobility Training    Plan  Positive:  motivation, family support.  Negative:  lives alone, legally blind, requires caregiver 7 days a week for a few hours a day for self care    Clinical Decision Making  Several treatment options, min-mod task modification necessary    Consulted and Agree with Plan of Care  Patient       Patient will benefit from skilled therapeutic intervention in order to improve the following deficits and impairments:  Decreased cognition, Decreased knowledge of use of DME, Impaired vision/preception, Pain, Decreased coordination, Decreased activity tolerance, Decreased endurance, Decreased strength, Decreased balance, Impaired UE functional  use  Visit Diagnosis: Muscle weakness  (generalized)  Other lack of coordination    Problem List Patient Active Problem List   Diagnosis Date Noted  . Lower extremity edema 04/15/2017  . Macrocytic anemia 02/11/2017  . Vitamin D deficiency 02/02/2016  . Mixed hyperlipidemia 01/03/2015  . MS (multiple sclerosis) ([facility]) 01/03/2015  . Chronic pain 01/03/2015  . GERD without esophagitis 01/03/2015  . COPD (chronic obstructive pulmonary disease) ([facility]) 01/03/2015  . Osteopenia 01/03/2015  . Neurogenic bladder 01/03/2015  . Incontinence in female 01/03/2015    Olegario Messier, MS, OTR/L 07/14/2018, 2:02 PM  [place] MAIN [facility] SERVICES [address] [place], [place], [address] Phone: [phone]   Fax:  [phone]  Name: [name] MRN: [id number] Date of Birth: 10-26-1955

## 2018-07-14 NOTE — Therapy (Signed)
[place] MAIN [facility] SERVICES [address] [place], [place], [address] Phone: [phone]   Fax:  [phone]  Physical Therapy Treatment  Patient Details  Name: [name] MRN: [id number] Date of Birth: 1955-12-04 Referring Provider (PT): Anabel Bene   Encounter Date: 07/14/2018  PT End of Session - 07/14/18 1313    Visit Number  39    Number of Visits  49    Date for PT Re-Evaluation  07/23/18    PT Start Time  0105    PT Stop Time  0145    PT Time Calculation (min)  40 min    Equipment Utilized During Treatment  Gait belt    Activity Tolerance  Patient tolerated treatment well    Behavior During Therapy  WFL for tasks assessed/performed       Past Medical History:  Diagnosis Date  . Headache   . Hyperlipidemia   . MS (multiple sclerosis) ([place]) 1990  . Neurogenic bladder     Past Surgical History:  Procedure Laterality Date  . ABDOMINAL HYSTERECTOMY    . OOPHORECTOMY Bilateral     There were no vitals filed for this visit.  Subjective Assessment - 07/14/18 1312    Subjective  Patient is doing well , no reports of pain. No falls since last visit. No specific questions or concerns.     Pertinent History  Patient reports about 3 months ago she started to feel weak in the left arm, reports she is legally blind.       How Warth can you stand comfortably?  not too Ehle     Patient Stated Goals  Patient wants to be able to walk better.     Currently in Pain?  No/denies    Pain Score  0-No pain    Pain Onset  More than a month ago         Treatment:  TM walking . 5 miles / hour x 5 mins  SAQ with 4 lbs with 3 sec hold x 20   hooklying marching with 4 lbs x 20   hooklying abd/ER with GTB x 20   Bridging x 20   Stepping over  hurdle fwd side to side x 10   Stepping over  hurdle bwd x10 cues for posture, increased hip flexion   Standing hip abd with GTB  2x10 bilaterally   Standing hip extension witht GTB   2x10 BLE   Cues for core activation of TA in standing  for improved posture. Patient needed several sitting rest breaks during session.  Side stepping x100 feet  CGA for safety, VCs for taking a big  Step and not turning hips   Gait out in hallway,Horizontal head turns x100 ft , Vertical head turns x100 ft,CGA for all activities with VCs for maintaining gait speed and step length with activities:   Gait with direction changes x100 ft, some difficulty with changing directions quickly and beginning to walk backwards  Speed changes x100 ft, some difficulty with slower gait speed and maintaining balance but good increase in gait speed    Cues for core activation of TA in standing  for improved posture. Patient needed several sitting rest breaks during session.CGA and Min  verbal cues used throughout with increased in postural sway Continues to have balance deficits typical with diagnosis. Patient performs intermediate level exercises without pain behaviors  PT Education - 07/14/18 1312    Education Details  HEP    Person(s) Educated  Patient    Methods  Explanation    Comprehension  Verbalized understanding;Returned demonstration;Need further instruction       PT Short Term Goals - 05/28/18 1416      PT SHORT TERM GOAL #1   Title  Patient will be independent in home exercise program to improve strength/mobility for better functional independence with ADLs.    Baseline  reports that she is not doing a home exercise program;     Time  4    Period  Weeks    Status  Not Met    Target Date  06/25/18      PT SHORT TERM GOAL #2   Title  Pt (< 41 years old) will complete five times sit to stand test in < 10 seconds indicating an increased LE strength and improved balance.    Baseline  02/11/18= 19.38; 03/16/18: 18.0s,  05/13/18=18.06 sec; 05/28/18: 18.59 sec    Time  4    Period  Weeks    Status  Not Met    Target Date  06/25/18        PT Klabunde Term Goals  - 06/29/18 1200      PT Leedom TERM GOAL #1   Title  Patient will increase six minute walk test distance to >1000 for progression to community ambulator and improve gait ability    Baseline  02/11/18= 500 feet; 03/16/18: 830 feet,  04/01/18= 515,05/13/18 605 ft; 05/28/18: 815 feet,  06/29/18=830 ft    Time  8    Period  Weeks    Status  Partially Met    Target Date  07/23/18      PT Mira TERM GOAL #2   Title  Patient will increase 10 meter walk test to >1.21ms as to improve gait speed for better community ambulation and to reduce fall risk.    Baseline  02/11/18 . 68 m/sec; 03/16/18: self-selected: 12.6s= 0.79 m/s fastest: 10.4s = 0.96 m/s, 04/01/18= .1.0 m/sec,05/13/18; 05/28/18: 0.89 m/s with rollator 06/29/18=1.02 m/sec    Time  8    Period  Weeks    Status  Not Met    Target Date  07/23/18      PT Reinertsen TERM GOAL #3   Title  Patient will reduce timed up and go to <11 seconds to reduce fall risk and demonstrate improved transfer/gait ability.    Baseline  17.49 sec 02/11/18; 03/16/18: 16.8s, 04/01/18= 15.9 sec, 05/13/18 14.87 sec; 05/28/18: 18.8 sec, 06/29/18=17.60 sec    Time  8    Period  Weeks    Status  Not Met    Target Date  07/23/18      PT Hippert TERM GOAL #4   Title  Patient (> 661years old) will complete five times sit to stand test in < 15 seconds indicating an increased LE strength and improved balance.    Baseline  19.38sec 02/11/18; 03/16/18: 18.0s, 04/01/18= 17.04 sec, 05/13/18= 18.06 sec1/6/20=18.65    Time  8    Period  Weeks    Status  Partially Met    Target Date  07/23/18            Plan - 07/14/18 1313    Clinical Impression Statement  Pt presents with unsteadiness on uneven surfaces and fatigues with therapeutic exercises. Patient needs assist with instruction for balance with side stepping on uneven surfaces,  and needs CGA  assist with balance activities. Patient demonstrates difficulty with side stepping  and navigating small spaces with decreased base of support and  increased challenges for LE.  Patient tolerated all interventions well this date and will benefit from continued skilled PT interventions to improve strength and balance and decrease risk of falling    Rehab Potential  Good    PT Frequency  1x / week    PT Duration  8 weeks    PT Treatment/Interventions  Gait training;Therapeutic exercise;Therapeutic activities;Functional mobility training;Neuromuscular re-education;Patient/family education;Balance training    PT Next Visit Plan  Review recent HEP, make sure its working out well.     PT Home Exercise Plan  see atached document     Consulted and Agree with Plan of Care  Patient       Patient will benefit from skilled therapeutic intervention in order to improve the following deficits and impairments:  Abnormal gait, Decreased balance, Decreased endurance, Postural dysfunction, Impaired flexibility, Decreased strength, Decreased safety awareness, Decreased activity tolerance, Decreased range of motion, Decreased knowledge of precautions, Impaired sensation, Difficulty walking  Visit Diagnosis: Muscle weakness (generalized)  Other lack of coordination  Difficulty in walking, not elsewhere classified     Problem List Patient Active Problem List   Diagnosis Date Noted  . Lower extremity edema 04/15/2017  . Macrocytic anemia 02/11/2017  . Vitamin D deficiency 02/02/2016  . Mixed hyperlipidemia 01/03/2015  . MS (multiple sclerosis) ([place]) 01/03/2015  . Chronic pain 01/03/2015  . GERD without esophagitis 01/03/2015  . COPD (chronic obstructive pulmonary disease) ([place]) 01/03/2015  . Osteopenia 01/03/2015  . Neurogenic bladder 01/03/2015  . Incontinence in female 01/03/2015    Alanson Puls, PT DPT 07/14/2018, 1:15 PM  [place] MAIN [facility] SERVICES [address] [place], [place], [address] Phone: [phone]   Fax:  [phone]  Name: [name] MRN: [id number] Date of Birth:  07/06/1955

## 2018-07-16 ENCOUNTER — Ambulatory Visit: Payer: Medicare Other | Admitting: Physical Therapy

## 2018-07-16 ENCOUNTER — Other Ambulatory Visit: Payer: Self-pay | Admitting: Acute Care

## 2018-07-16 ENCOUNTER — Encounter: Payer: Self-pay | Admitting: Occupational Therapy

## 2018-07-16 ENCOUNTER — Ambulatory Visit: Payer: Medicare Other | Admitting: Occupational Therapy

## 2018-07-16 DIAGNOSIS — R278 Other lack of coordination: Secondary | ICD-10-CM

## 2018-07-16 DIAGNOSIS — M6281 Muscle weakness (generalized): Secondary | ICD-10-CM | POA: Diagnosis not present

## 2018-07-16 DIAGNOSIS — G35 Multiple sclerosis: Secondary | ICD-10-CM

## 2018-07-16 DIAGNOSIS — M545 Low back pain: Principal | ICD-10-CM

## 2018-07-16 DIAGNOSIS — G8929 Other chronic pain: Secondary | ICD-10-CM

## 2018-07-16 DIAGNOSIS — R262 Difficulty in walking, not elsewhere classified: Secondary | ICD-10-CM | POA: Diagnosis not present

## 2018-07-16 NOTE — Therapy (Signed)
[facility] MAIN [facility] SERVICES [address] [place], [place], [address] Phone: [phone]   Fax:  [phone]  Occupational Therapy Treatment  Patient Details  Name: [name] MRN: [id number] Date of Birth: 11/17/1955 No data recorded  Encounter Date: 07/16/2018  OT End of Session - 07/16/18 1358    Visit Number  60    Number of Visits  72    Date for OT Re-Evaluation  09/29/18    Authorization Type  Progress report period staring 06/02/2018    OT Start Time  1345    OT Stop Time  1430    OT Time Calculation (min)  45 min    Equipment Utilized During Treatment  magnifying glass    Activity Tolerance  Treatment limited secondary to medical complications (Comment)    Behavior During Therapy  [facility] for tasks assessed/performed       Past Medical History:  Diagnosis Date  . Headache   . Hyperlipidemia   . MS (multiple sclerosis) ([facility]) 1990  . Neurogenic bladder     Past Surgical History:  Procedure Laterality Date  . ABDOMINAL HYSTERECTOMY    . OOPHORECTOMY Bilateral     There were no vitals filed for this visit.  Subjective Assessment - 07/16/18 1355    Subjective   Pt. reports that she has to remember to pick up her left leg when she walks.    Pertinent History  Patient reports she was diagnosed with MS years ago and has recently started to feel weak in her left dominant arm and hand.  She denies any previous therapy and reports memory issues and can provide a limited history.     Patient Stated Goals  Patient reports she wants to be able to walk better, use her left hand and be able to write again.     Currently in Pain?  No/denies      OT TREATMENT    Neuro muscular re-education:  Pt. worked on reaching into a container for 2" pegs, and placing the pegs onto an [employer]. Pt. worked on grasping, flipping, and stacking 2" large pegs on the [employer] board placed at a tabletop surface. Pt. required increased time to  complete. Pt. worked on [facility] skills for writing legibility, applying lipstick, and putting earrings in. Pt. Present with limited motor control, and [facility] skill. Pt. Requires cues to avoid compensating proximally.                             OT Education - 07/16/18 1358    Education provided  Yes    Education Details  Left hand [facility] skills, reaching skills    Person(s) Educated  Patient    Methods  Explanation;Demonstration;Verbal cues    Comprehension  Verbalized understanding;Verbal cues required;Returned demonstration          OT Stopper Term Goals - 07/07/18 1411      OT Crammer TERM GOAL #1   Title  Patient will demonstrate holding utensils in her left hand and feeding herself with modified independence with minimal spillage.      Baseline  04/09/2018: Pt spills >50% of food when using L hand. Pt is able to cut soft meats while using her fork.    Time  8    Period  Weeks    Status  Achieved      OT Kellen TERM GOAL #2   Title  Patient will complete  tub/shower transfer with supervision only.    Baseline  04/09/2018 Pt requires min A to lift legs over tub for transfers    Time  8    Period  Weeks    Status  Deferred      OT Whitmoyer TERM GOAL #3   Title  Pt. will independently opens packets, and bags indpendently    Baseline  MinA    Time  12    Period  Weeks    Status  New    Target Date  09/29/18      OT Vicens TERM GOAL #4   Title  Patient will increase left UE strength by 1 mm grade to be able to reach into closet to retrieve clothing items.     Baseline  07/07/2018: Pt LUE strength 4/5. Pt can reach items in her closet independently    Time  12    Period  Weeks    Status  On-going    Target Date  09/29/18      OT Brickel TERM GOAL #5   Title  Patient will improve coordination on the left to be able to hold pen securely and write her name with 75% legibility.     Baseline  07/07/2018: Pt prints name with 60% legibility, cursive words with 25% legibility using  standard pen with a grip    Time  12    Period  Weeks    Status  On-going    Target Date  09/29/18      OT Bolda TERM GOAL #6   Title  Patient will complete lower body dressing with modified independence using adaptive equipment if needed.     Baseline  04/09/2018 - Pt requires min A to don pants. Pt requires increased time to tie shoes independently. 05/28/18 modified independent    Time  12    Period  Weeks    Status  Achieved      OT Schorr TERM GOAL #7   Title  Pt. will independently put her earrings in.    Baseline  07/07/2018: Pt. is unable, and has difficulty putting her earrings in.    Time  12    Period  Weeks    Status  New    Target Date  09/29/18      OT Frentz TERM GOAL #8   Title  Pt. will be indpendent with using her left hand to apply her lipstick.    Baseline  07/07/2018: Pt. is unable  to complete    Time  12    Period  Weeks    Status  New    Target Date  09/29/18      OT Wentling TERM GOAL  #9   Baseline  Pt. will independently open containers, and jars.    Time  12    Period  Weeks    Status  New    Target Date  09/29/18            Plan - 07/16/18 1400    Clinical Impression Statement Pt. presents with limited LUE strength, and [facility] skills which hinders her ability to complete ADL, and IADL functioning.  Pt. requires cues to use her left hand during daily tasks. Pt. continues to work on improving left hand [facility] skills to be able to put earrings in, write legibly, and apply lipstick.     Occupational Profile and client history currently impacting functional performance  patient is legally blind, progressive disease process with MS for  over 20+ years, lives alone, limited availability for transportation.      Occupational performance deficits (Please refer to evaluation for details):  ADL's;Leisure    Rehab Potential  Good    OT Frequency  2x / week    OT Duration  12 weeks    OT Treatment/Interventions  Therapeutic exercise;Cognitive  remediation/compensation;Functional Mobility Training    Plan  Positive:  motivation, family support.  Negative:  lives alone, legally blind, requires caregiver 7 days a week for a few hours a day for self care    Clinical Decision Making  Several treatment options, min-mod task modification necessary    Consulted and Agree with Plan of Care  Patient       Patient will benefit from skilled therapeutic intervention in order to improve the following deficits and impairments:  Decreased cognition, Decreased knowledge of use of DME, Impaired vision/preception, Pain, Decreased coordination, Decreased activity tolerance, Decreased endurance, Decreased strength, Decreased balance, Impaired UE functional use  Visit Diagnosis: Muscle weakness (generalized)  Other lack of coordination    Problem List Patient Active Problem List   Diagnosis Date Noted  . Lower extremity edema 04/15/2017  . Macrocytic anemia 02/11/2017  . Vitamin D deficiency 02/02/2016  . Mixed hyperlipidemia 01/03/2015  . MS (multiple sclerosis) ([facility]) 01/03/2015  . Chronic pain 01/03/2015  . GERD without esophagitis 01/03/2015  . COPD (chronic obstructive pulmonary disease) ([facility]) 01/03/2015  . Osteopenia 01/03/2015  . Neurogenic bladder 01/03/2015  . Incontinence in female 01/03/2015    Olegario Messier, MS, OTR/L 07/16/2018, 2:13 PM  [place] [facility] MAIN [facility] SERVICES [address] [place], [place], [address] Phone: [phone]   Fax:  [phone]  Name: JAZIAH HENINGER MRN: [id number] Date of Birth: 19-May-1956

## 2018-07-21 ENCOUNTER — Encounter: Payer: Self-pay | Admitting: Occupational Therapy

## 2018-07-21 ENCOUNTER — Ambulatory Visit: Payer: Medicare Other | Admitting: Physical Therapy

## 2018-07-21 ENCOUNTER — Ambulatory Visit: Payer: Medicare Other | Admitting: Occupational Therapy

## 2018-07-21 ENCOUNTER — Encounter: Payer: Self-pay | Admitting: Physical Therapy

## 2018-07-21 DIAGNOSIS — M6281 Muscle weakness (generalized): Secondary | ICD-10-CM

## 2018-07-21 DIAGNOSIS — R262 Difficulty in walking, not elsewhere classified: Secondary | ICD-10-CM

## 2018-07-21 DIAGNOSIS — R278 Other lack of coordination: Secondary | ICD-10-CM

## 2018-07-21 NOTE — Therapy (Signed)
[place] [facility] MAIN [facility] SERVICES [address] [place], [place], [address] Phone: [phone]   Fax:  [phone]  Occupational Therapy Treatment  Patient Details  Name: [name] MRN: [id number] Date of Birth: [birth year] No data recorded  Encounter Date: 07/21/2018  OT End of Session - 07/21/18 1448    Visit Number  61    Number of Visits  72    Date for OT Re-Evaluation  09/29/18    Authorization Type  Progress report period staring 06/02/2018    OT Start Time  1437    OT Stop Time  1515    OT Time Calculation (min)  38 min    Equipment Utilized During Treatment  magnifying glass    Activity Tolerance  Treatment limited secondary to medical complications (Comment)    Behavior During Therapy  [facility] for tasks assessed/performed       Past Medical History:  Diagnosis Date  . Headache   . Hyperlipidemia   . MS (multiple sclerosis) ([facility]) 1990  . Neurogenic bladder     Past Surgical History:  Procedure Laterality Date  . ABDOMINAL HYSTERECTOMY    . OOPHORECTOMY Bilateral     There were no vitals filed for this visit.  Subjective Assessment - 07/21/18 1446    Subjective   Pt. reports that she had a good weekend.    Pertinent History  Patient reports she was diagnosed with MS years ago and has recently started to feel weak in her left dominant arm and hand.  She denies any previous therapy and reports memory issues and can provide a limited history.     Patient Stated Goals  Patient reports she wants to be able to walk better, use her left hand and be able to write again.     Currently in Pain?  Yes    Pain Score  4     Pain Location  Back    Pain Orientation  Left    Pain Descriptors / Indicators  Aching    Pain Type  Chronic pain    Pain Onset  More than a month ago    Pain Frequency  Constant      OT TREATMENT    Neuro muscular re-education:  Pt. worked on grasping heavy duty resistive magnets of varying sizes. Pt. worked  on placing them on a whiteboard positioned at a vertical angle on the tabletop, on an elevated surface to encourage [facility] while UEs are elevated. Pt. worked on Human resources officer with resistance. Pt. worked on [facility] grasping 1/2" small magnetic pegs, disconnecting them, and placing them on a positioned at a vertical angle at a tabletop, on an elevated angle to encourage [facility] skills with arms sustained in elevation. Pt. worked on removing the pegs while alternating thumb opposition to the tip of the 2nd through 5th digits. Pt. worked on these tasks in order to work towards improving independence with applying lipstick, applying makeup, and opening containers.                           OT Education - 07/21/18 1448    Education provided  Yes    Education Details  Left hand [facility] skills, reaching skills    Person(s) Educated  Patient    Methods  Explanation;Demonstration;Verbal cues    Comprehension  Verbalized understanding;Verbal cues required;Returned demonstration          OT Kuras Term Goals - 07/07/18  1411      OT Swoboda TERM GOAL #1   Title  Patient will demonstrate holding utensils in her left hand and feeding herself with modified independence with minimal spillage.      Baseline  04/09/2018: Pt spills >50% of food when using L hand. Pt is able to cut soft meats while using her fork.    Time  8    Period  Weeks    Status  Achieved      OT Walrath TERM GOAL #2   Title  Patient will complete tub/shower transfer with supervision only.    Baseline  04/09/2018 Pt requires min A to lift legs over tub for transfers    Time  8    Period  Weeks    Status  Deferred      OT Kessenich TERM GOAL #3   Title  Pt. will independently opens packets, and bags indpendently    Baseline  MinA    Time  12    Period  Weeks    Status  New    Target Date  09/29/18      OT Buchanan TERM GOAL #4   Title  Patient will increase left UE strength by 1 mm grade to be able to reach into closet to  retrieve clothing items.     Baseline  07/07/2018: Pt LUE strength 4/5. Pt can reach items in her closet independently    Time  12    Period  Weeks    Status  On-going    Target Date  09/29/18      OT Faria TERM GOAL #5   Title  Patient will improve coordination on the left to be able to hold pen securely and write her name with 75% legibility.     Baseline  07/07/2018: Pt prints name with 60% legibility, cursive words with 25% legibility using standard pen with a grip    Time  12    Period  Weeks    Status  On-going    Target Date  09/29/18      OT Fraley TERM GOAL #6   Title  Patient will complete lower body dressing with modified independence using adaptive equipment if needed.     Baseline  04/09/2018 - Pt requires min A to don pants. Pt requires increased time to tie shoes independently. 05/28/18 modified independent    Time  12    Period  Weeks    Status  Achieved      OT Stevick TERM GOAL #7   Title  Pt. will independently put her earrings in.    Baseline  07/07/2018: Pt. is unable, and has difficulty putting her earrings in.    Time  12    Period  Weeks    Status  New    Target Date  09/29/18      OT Wolden TERM GOAL #8   Title  Pt. will be indpendent with using her left hand to apply her lipstick.    Baseline  07/07/2018: Pt. is unable  to complete    Time  12    Period  Weeks    Status  New    Target Date  09/29/18      OT Allender TERM GOAL  #9   Baseline  Pt. will independently open containers, and jars.    Time  12    Period  Weeks    Status  New    Target Date  09/29/18  Plan - 07/21/18 1449    Clinical Impression Statement  Pt. has an appointment scheduled with her physician to have her back checked out. Pt. reports constant 4/10 back pain. Pt. continues to present with limited LUE srength, and [facility] skills, and works on these skills to improve motor control, and cordination skills needed during ADL, and IADL functioning while applying earrings, lipstick,  and opening containers.    Occupational Profile and client history currently impacting functional performance  patient is legally blind, progressive disease process with MS for over 20+ years, lives alone, limited availability for transportation.      Occupational performance deficits (Please refer to evaluation for details):  ADL's;Leisure    Rehab Potential  Good    OT Frequency  2x / week    OT Duration  12 weeks    OT Treatment/Interventions  Therapeutic exercise;Cognitive remediation/compensation;Functional Mobility Training    Plan  Positive:  motivation, family support.  Negative:  lives alone, legally blind, requires caregiver 7 days a week for a few hours a day for self care    Clinical Decision Making  Several treatment options, min-mod task modification necessary    Consulted and Agree with Plan of Care  Patient       Patient will benefit from skilled therapeutic intervention in order to improve the following deficits and impairments:  Decreased cognition, Decreased knowledge of use of DME, Impaired vision/preception, Pain, Decreased coordination, Decreased activity tolerance, Decreased endurance, Decreased strength, Decreased balance, Impaired UE functional use  Visit Diagnosis: Muscle weakness (generalized)  Other lack of coordination    Problem List Patient Active Problem List   Diagnosis Date Noted  . Lower extremity edema 04/15/2017  . Macrocytic anemia 02/11/2017  . Vitamin D deficiency 02/02/2016  . Mixed hyperlipidemia 01/03/2015  . MS (multiple sclerosis) ([facility]) 01/03/2015  . Chronic pain 01/03/2015  . GERD without esophagitis 01/03/2015  . COPD (chronic obstructive pulmonary disease) ([facility]) 01/03/2015  . Osteopenia 01/03/2015  . Neurogenic bladder 01/03/2015  . Incontinence in female 01/03/2015    Olegario Messier, MS, OTR/L 07/21/2018, 3:03 PM  [place] [facility] MAIN [facility] SERVICES [address] [place], [place],  [address] Phone: [phone]   Fax:  [phone]  Name: JASALYNN PFEIL MRN: [id number] Date of Birth: 07/08/1955

## 2018-07-21 NOTE — Therapy (Signed)
[place] MAIN [facility] SERVICES [address] [place], [place], [address] Phone: [phone]   Fax:  [phone]  Physical Therapy Treatment / Physical Therapy Progress Note/ Discharge Summary   Dates of reporting period  05/14/19   to   07/21/18  Patient Details  Name: [name] MRN: [id number] Date of Birth: 12-23-1955 Referring Provider (PT): Anabel Bene   Encounter Date: 07/21/2018  PT End of Session - 07/21/18 1504    Visit Number  40    Number of Visits  49    Date for PT Re-Evaluation  07/23/18    PT Start Time  0315    PT Stop Time  0400    PT Time Calculation (min)  45 min    Equipment Utilized During Treatment  Gait belt    Activity Tolerance  Patient tolerated treatment well    Behavior During Therapy  [facility] for tasks assessed/performed       Past Medical History:  Diagnosis Date  . Headache   . Hyperlipidemia   . MS (multiple sclerosis) ([place]) 1990  . Neurogenic bladder     Past Surgical History:  Procedure Laterality Date  . ABDOMINAL HYSTERECTOMY    . OOPHORECTOMY Bilateral     There were no vitals filed for this visit.  Subjective Assessment - 07/21/18 1514    Subjective  Patient is doing well ,she is having back pain.  No falls since last visit. No specific questions or concerns.     Pertinent History  Patient reports about 3 months ago she started to feel weak in the left arm, reports she is legally blind.       How Glatfelter can you stand comfortably?  not too Kohler     Patient Stated Goals  Patient wants to be able to walk better.     Currently in Pain?  Yes    Pain Score  3     Pain Location  Back    Pain Orientation  Lower    Pain Descriptors / Indicators  Aching    Pain Onset  More than a month ago       Patient performs outcome measures for progress note: including 5 x sit to stand, 10 MW, TUG, 6 MW test    OUTCOME MEASURES:eval date TEST Outcome Interpretation  5 times sit<>stand 17.19sec >55  yo, >15 sec indicates increased risk for falls  10 meter walk test .46 sec                 m/s <1.0 m/s indicates increased risk for falls; limited community ambulator  Timed up and Go    31.39              sec <14 sec indicates increased risk for falls  6 minute walk test     450            Feet 1000 feet is community ambulator             OUTCOME MEASURES: 07/21/18 TEST Outcome Interpretation  5 times sit<>stand 18.01sec >62 yo, >15 sec indicates increased risk for falls  10 meter walk test 1.10 sec                 m/s <1.0 m/s indicates increased risk for falls; limited community ambulator  Timed up and Go    13.54           sec <14 sec indicates increased risk for falls  6 minute walk test     800          Feet 1000 feet is community ambulator            Therapeutic exercise:   Leg press x 15 x 2 with 75 lbs, Verbal cues to complete slowly and not let knees snap back. 2nd set 105lbs   Single leg press 45# x15 each side. Verbal cues to slow down movement.     Step ups to 6 inch stool x 10. No UE suport. CGA   Eccentric step downs from 6 inch stool x 10 each LE. Visual cues for technique. Verbal cues to complete slow and tap heel.  BUE CGA                 PT Education - 07/21/18 1504    Education Details  HEP    Person(s) Educated  Patient    Methods  Explanation    Comprehension  Verbalized understanding;Returned demonstration;Need further instruction       PT Short Term Goals - 07/21/18 1507      PT SHORT TERM GOAL #1   Title  Patient will be independent in home exercise program to improve strength/mobility for better functional independence with ADLs.    Baseline  reports that she is not doing a home exercise program;     Time  4    Period  Weeks    Status  Not Met      PT SHORT TERM GOAL #2   Title  Pt (< 28 years old) will complete five times sit to stand test in < 10 seconds indicating an increased LE strength and improved balance.     Baseline  02/11/18= 19.38; 03/16/18: 18.0s,  05/13/18=18.06 sec; 05/28/18: 18.59 sec    Time  4    Period  Weeks    Status  Not Met        PT Metzger Term Goals - 07/21/18 1505      PT Sapien TERM GOAL #1   Title  Patient will increase six minute walk test distance to >1000 for progression to community ambulator and improve gait ability    Baseline  02/11/18= 500 feet; 03/16/18: 830 feet,  04/01/18= 515,05/13/18 605 ft; 05/28/18: 815 feet,  06/29/18=830 ft, 07/21/18=800 feet    Time  8    Period  Weeks    Status  Partially Met    Target Date  07/21/18      PT Stettner TERM GOAL #2   Title  Patient will increase 10 meter walk test to >1.33ms as to improve gait speed for better community ambulation and to reduce fall risk.    Baseline  02/11/18 . 68 m/sec; 03/16/18: self-selected: 12.6s= 0.79 m/s fastest: 10.4s = 0.96 m/s, 04/01/18= .1.0 m/sec,05/13/18; 05/28/18: 0.89 m/s with rollator 06/29/18=1.02 m/sec, 07/21/18=1.10    Time  8    Period  Weeks    Status  Achieved      PT Osei TERM GOAL #3   Title  Patient will reduce timed up and go to <11 seconds to reduce fall risk and demonstrate improved transfer/gait ability.    Baseline  17.49 sec 02/11/18; 03/16/18: 16.8s, 04/01/18= 15.9 sec, 05/13/18 14.87 sec; 05/28/18: 18.8 sec, 06/29/18=17.60 sec, 07/21/18=13.54    Time  8    Period  Weeks    Status  Partially Met      PT Sohail TERM GOAL #4   Title  Patient (> 622years old)  will complete five times sit to stand test in < 15 seconds indicating an increased LE strength and improved balance.    Baseline  19.38sec 02/11/18; 03/16/18: 18.0s, 04/01/18= 17.04 sec, 05/13/18= 18.06 sec1/6/20=18.65, 07/21/18=18.01 sec    Time  8    Period  Weeks    Status  Partially Met    Target Date  07/21/18            Plan - 07/21/18 1543    Clinical Impression Statement  Patient performs outcome meausres and 3 out of her 4  goals were met . She will be discharged to home exercise program. She was educated about her falls risk and  to continue to perform HEP.     Rehab Potential  Good    PT Frequency  1x / week    PT Duration  8 weeks    PT Treatment/Interventions  Gait training;Therapeutic exercise;Therapeutic activities;Functional mobility training;Neuromuscular re-education;Patient/family education;Balance training    PT Next Visit Plan  Review recent HEP, make sure its working out well.     PT Home Exercise Plan  see atached document     Consulted and Agree with Plan of Care  Patient       Patient will benefit from skilled therapeutic intervention in order to improve the following deficits and impairments:  Abnormal gait, Decreased balance, Decreased endurance, Postural dysfunction, Impaired flexibility, Decreased strength, Decreased safety awareness, Decreased activity tolerance, Decreased range of motion, Decreased knowledge of precautions, Impaired sensation, Difficulty walking  Visit Diagnosis: Muscle weakness (generalized)  Other lack of coordination  Difficulty in walking, not elsewhere classified     Problem List Patient Active Problem List   Diagnosis Date Noted  . Lower extremity edema 04/15/2017  . Macrocytic anemia 02/11/2017  . Vitamin D deficiency 02/02/2016  . Mixed hyperlipidemia 01/03/2015  . MS (multiple sclerosis) ([place]) 01/03/2015  . Chronic pain 01/03/2015  . GERD without esophagitis 01/03/2015  . COPD (chronic obstructive pulmonary disease) ([place]) 01/03/2015  . Osteopenia 01/03/2015  . Neurogenic bladder 01/03/2015  . Incontinence in female 01/03/2015    Alanson Puls, PT DPT 07/21/2018, 3:47 PM  [place] MAIN [facility] SERVICES [address] [place], [place], [address] Phone: [phone]   Fax:  [phone]  Name: HELENA SARDO MRN: [id number] Date of Birth: [birth year]

## 2018-07-23 ENCOUNTER — Ambulatory Visit: Payer: Medicare Other

## 2018-07-23 ENCOUNTER — Encounter: Payer: Self-pay | Admitting: Occupational Therapy

## 2018-07-23 ENCOUNTER — Ambulatory Visit: Payer: Medicare Other | Admitting: Physical Therapy

## 2018-07-23 ENCOUNTER — Ambulatory Visit: Payer: Medicare Other | Admitting: Occupational Therapy

## 2018-07-23 DIAGNOSIS — R262 Difficulty in walking, not elsewhere classified: Secondary | ICD-10-CM | POA: Diagnosis not present

## 2018-07-23 DIAGNOSIS — M6281 Muscle weakness (generalized): Secondary | ICD-10-CM

## 2018-07-23 DIAGNOSIS — R278 Other lack of coordination: Secondary | ICD-10-CM

## 2018-07-23 NOTE — Therapy (Signed)
[facility] MAIN [facility] SERVICES [address] [place], [place], [address] Phone: [phone]   Fax:  [phone]  Occupational Therapy Treatment  Patient Details  Name: [name] MRN: [id number] Date of Birth: 1956/02/01 No data recorded  Encounter Date: 07/23/2018  OT End of Session - 07/23/18 1441    Visit Number  62    Number of Visits  72    Date for OT Re-Evaluation  09/29/18    Authorization Type  Progress report period staring 06/02/2018    OT Start Time  1433    OT Stop Time  1515    OT Time Calculation (min)  42 min    Activity Tolerance  Treatment limited secondary to medical complications (Comment)    Behavior During Therapy  [facility] for tasks assessed/performed       Past Medical History:  Diagnosis Date  . Headache   . Hyperlipidemia   . MS (multiple sclerosis) ([facility]) 1990  . Neurogenic bladder     Past Surgical History:  Procedure Laterality Date  . ABDOMINAL HYSTERECTOMY    . OOPHORECTOMY Bilateral     There were no vitals filed for this visit.  Subjective Assessment - 07/23/18 1439    Subjective   Pt. reports no additional changes.    Pertinent History  Patient reports she was diagnosed with MS years ago and has recently started to feel weak in her left dominant arm and hand.  She denies any previous therapy and reports memory issues and can provide a limited history.     Patient Stated Goals  Patient reports she wants to be able to walk better, use her left hand and be able to write again.     Currently in Pain?  No/denies    Pain Score  3     Pain Location  Back    Pain Orientation  Lower    Pain Descriptors / Indicators  Aching      OT TREATMENT    Neuro muscular re-education:  Pt. worked on grasping, flipping and stacking 2" large pegs on the Instructo board placed at a tabletop surface. Pt. also worked on flipping pegs simulateously with the right, and left hands. Pt. Worked on grasping one inch  resistive cubes alternating thumb opposition to the tip of the 2nd through 5th digits. The board was positioned at a vertical angle. Pt. Worked on pressing them back into place while isolating 2nd through 5th digits.                        OT Education - 07/23/18 1441    Education provided  Yes    Education Details  Left hand [facility] skills, reaching skills    Person(s) Educated  Patient    Methods  Explanation;Demonstration;Verbal cues    Comprehension  Verbalized understanding;Verbal cues required;Returned demonstration          OT Besecker Term Goals - 07/07/18 1411      OT Castell TERM GOAL #1   Title  Patient will demonstrate holding utensils in her left hand and feeding herself with modified independence with minimal spillage.      Baseline  04/09/2018: Pt spills >50% of food when using L hand. Pt is able to cut soft meats while using her fork.    Time  8    Period  Weeks    Status  Achieved      OT Gorelik TERM GOAL #2  Title  Patient will complete tub/shower transfer with supervision only.    Baseline  04/09/2018 Pt requires min A to lift legs over tub for transfers    Time  8    Period  Weeks    Status  Deferred      OT Broyhill TERM GOAL #3   Title  Pt. will independently opens packets, and bags indpendently    Baseline  MinA    Time  12    Period  Weeks    Status  New    Target Date  09/29/18      OT Orona TERM GOAL #4   Title  Patient will increase left UE strength by 1 mm grade to be able to reach into closet to retrieve clothing items.     Baseline  07/07/2018: Pt LUE strength 4/5. Pt can reach items in her closet independently    Time  12    Period  Weeks    Status  On-going    Target Date  09/29/18      OT Bottino TERM GOAL #5   Title  Patient will improve coordination on the left to be able to hold pen securely and write her name with 75% legibility.     Baseline  07/07/2018: Pt prints name with 60% legibility, cursive words with 25% legibility using  standard pen with a grip    Time  12    Period  Weeks    Status  On-going    Target Date  09/29/18      OT Berkowitz TERM GOAL #6   Title  Patient will complete lower body dressing with modified independence using adaptive equipment if needed.     Baseline  04/09/2018 - Pt requires min A to don pants. Pt requires increased time to tie shoes independently. 05/28/18 modified independent    Time  12    Period  Weeks    Status  Achieved      OT Korpi TERM GOAL #7   Title  Pt. will independently put her earrings in.    Baseline  07/07/2018: Pt. is unable, and has difficulty putting her earrings in.    Time  12    Period  Weeks    Status  New    Target Date  09/29/18      OT Zackery TERM GOAL #8   Title  Pt. will be indpendent with using her left hand to apply her lipstick.    Baseline  07/07/2018: Pt. is unable  to complete    Time  12    Period  Weeks    Status  New    Target Date  09/29/18      OT S[place]geroth TERM GOAL  #9   Baseline  Pt. will independently open containers, and jars.    Time  12    Period  Weeks    Status  New    Target Date  09/29/18            Plan - 07/23/18 1442    Clinical Impression Statement  Pt. continues to make steady progress overall. Pt. reports the she has inproved with feeding herself. Pt. uses her RIght UE during tasks, and is abe to engage her left hand during ADL, and IADL tasks. Pt. continues to work on improving LUE strength, motor control, and [facility] skills in order to be able to improve ADL, and IADL skills.    Occupational Profile and client history currently impacting functional  performance  patient is legally blind, progressive disease process with MS for over 20+ years, lives alone, limited availability for transportation.      Occupational performance deficits (Please refer to evaluation for details):  ADL's;Leisure    Rehab Potential  Poor    OT Frequency  2x / week    OT Duration  12 weeks    OT Treatment/Interventions  Therapeutic  exercise;Cognitive remediation/compensation;Functional Mobility Training    Plan  Positive:  motivation, family support.  Negative:  lives alone, legally blind, requires caregiver 7 days a week for a few hours a day for self care    Clinical Decision Making  Several treatment options, min-mod task modification necessary    Consulted and Agree with Plan of Care  Patient       Patient will benefit from skilled therapeutic intervention in order to improve the following deficits and impairments:  Decreased cognition, Decreased knowledge of use of DME, Impaired vision/preception, Pain, Decreased coordination, Decreased activity tolerance, Decreased endurance, Decreased strength, Decreased balance, Impaired UE functional use  Visit Diagnosis: Muscle weakness (generalized)  Other lack of coordination    Problem List Patient Active Problem List   Diagnosis Date Noted  . Lower extremity edema 04/15/2017  . Macrocytic anemia 02/11/2017  . Vitamin D deficiency 02/02/2016  . Mixed hyperlipidemia 01/03/2015  . MS (multiple sclerosis) ([facility]) 01/03/2015  . Chronic pain 01/03/2015  . GERD without esophagitis 01/03/2015  . COPD (chronic obstructive pulmonary disease) ([facility]) 01/03/2015  . Osteopenia 01/03/2015  . Neurogenic bladder 01/03/2015  . Incontinence in female 01/03/2015    Olegario Messier 07/23/2018, 3:04 PM  [facility] MAIN [facility] SERVICES [address] [place], [place], [address] Phone: [phone]   Fax:  [phone]  Name: [name] MRN: [id number] Date of Birth: 04-23-1956

## 2018-07-27 ENCOUNTER — Ambulatory Visit: Payer: Medicare Other | Admitting: Physical Therapy

## 2018-07-27 ENCOUNTER — Encounter: Payer: Medicare Other | Admitting: Occupational Therapy

## 2018-07-27 ENCOUNTER — Ambulatory Visit
Admission: RE | Admit: 2018-07-27 | Discharge: 2018-07-27 | Disposition: A | Discharge status: Home / Self Care | Payer: Medicare Other | Source: Ambulatory Visit | Attending: Acute Care | Admitting: Acute Care

## 2018-07-27 DIAGNOSIS — M545 Low back pain, unspecified: Secondary | ICD-10-CM

## 2018-07-27 DIAGNOSIS — M47816 Spondylosis without myelopathy or radiculopathy, lumbar region: Secondary | ICD-10-CM | POA: Diagnosis not present

## 2018-07-27 DIAGNOSIS — G35 Multiple sclerosis: Secondary | ICD-10-CM | POA: Diagnosis not present

## 2018-07-27 DIAGNOSIS — G8929 Other chronic pain: Secondary | ICD-10-CM

## 2018-07-29 ENCOUNTER — Encounter: Payer: Self-pay | Admitting: Occupational Therapy

## 2018-07-29 ENCOUNTER — Ambulatory Visit: Payer: Medicare Other | Attending: Neurology | Admitting: Occupational Therapy

## 2018-07-29 DIAGNOSIS — M6281 Muscle weakness (generalized): Secondary | ICD-10-CM

## 2018-07-29 DIAGNOSIS — R278 Other lack of coordination: Secondary | ICD-10-CM | POA: Diagnosis not present

## 2018-07-29 NOTE — Therapy (Signed)
[place] [facility] MAIN [facility] SERVICES [address] [place], [place], [address] Phone: [phone]   Fax:  [phone]  Occupational Therapy Treatment  Patient Details  Name: [name] MRN: [id number] Date of Birth: 1955-09-20 No data recorded  Encounter Date: 07/29/2018  OT End of Session - 07/29/18 1806    Visit Number  63    Number of Visits  72    Date for OT Re-Evaluation  09/29/18    Authorization Type  Progress report period staring 06/02/2018    OT Start Time  1433    OT Stop Time  1515    OT Time Calculation (min)  42 min    Activity Tolerance  Treatment limited secondary to medical complications (Comment)    Behavior During Therapy  [facility] for tasks assessed/performed       Past Medical History:  Diagnosis Date  . Headache   . Hyperlipidemia   . MS (multiple sclerosis) ([facility]) 1990  . Neurogenic bladder     Past Surgical History:  Procedure Laterality Date  . ABDOMINAL HYSTERECTOMY    . OOPHORECTOMY Bilateral     There were no vitals filed for this visit.  Subjective Assessment - 07/29/18 1804    Subjective   Pt. reports that she is doing well today.    Pertinent History  Patient reports she was diagnosed with MS years ago and has recently started to feel weak in her left dominant arm and hand.  She denies any previous therapy and reports memory issues and can provide a limited history.     Patient Stated Goals  Patient reports she wants to be able to walk better, use her left hand and be able to write again.     Currently in Pain?  Yes    Pain Score  4     Pain Location  Back    Pain Orientation  Lower    Pain Descriptors / Indicators  Aching    Pain Type  Chronic pain      OT TREATMENT    Neuro muscular re-education:  Pt. worked on grasping heavy duty resistive magnets of varying sizes. Pt. worked on placing them on a whiteboard positioned at a vertical angle on the tabletop, on an elevated surface to encourage [facility]  while UEs are elevated. Pt. worked on Human resources officer with resistance.Pt. was able to sort the colors with 100% accuracy. Pt. worked on [facility] grasping 1/2" small magnetic pegs, disconnecting them, and placing them on a positioned at a vertical angle at a tabletop, on an elevated angle to encourage [facility] skills with arms sustained in elevation. Pt. Required assist, and cues for sorting the pegs by color into columns. Pt. worked on removing the pegs while alternating thumb opposition to the tip of the 2nd through 5th digits.  Selfcare:  Pt. worked Mining engineer, listing daily meals in preparation for creating a shopping list. Pt. listed items for breakfast, lunch, and dinner with less than 25% legibility using a weighted pen.                        OT Education - 07/29/18 1806    Education provided  Yes    Education Details  Left hand [facility] skills, reaching skills    Person(s) Educated  Patient    Methods  Explanation;Demonstration;Verbal cues    Comprehension  Verbalized understanding;Verbal cues required;Returned demonstration          OT  Plott Term Goals - 07/07/18 1411      OT Lhommedieu TERM GOAL #1   Title  Patient will demonstrate holding utensils in her left hand and feeding herself with modified independence with minimal spillage.      Baseline  04/09/2018: Pt spills >50% of food when using L hand. Pt is able to cut soft meats while using her fork.    Time  8    Period  Weeks    Status  Achieved      OT Hudgins TERM GOAL #2   Title  Patient will complete tub/shower transfer with supervision only.    Baseline  04/09/2018 Pt requires min A to lift legs over tub for transfers    Time  8    Period  Weeks    Status  Deferred      OT Azar TERM GOAL #3   Title  Pt. will independently opens packets, and bags indpendently    Baseline  MinA    Time  12    Period  Weeks    Status  New    Target Date  09/29/18      OT Iwai TERM GOAL #4   Title  Patient will  increase left UE strength by 1 mm grade to be able to reach into closet to retrieve clothing items.     Baseline  07/07/2018: Pt LUE strength 4/5. Pt can reach items in her closet independently    Time  12    Period  Weeks    Status  On-going    Target Date  09/29/18      OT Macon TERM GOAL #5   Title  Patient will improve coordination on the left to be able to hold pen securely and write her name with 75% legibility.     Baseline  07/07/2018: Pt prints name with 60% legibility, cursive words with 25% legibility using standard pen with a grip    Time  12    Period  Weeks    Status  On-going    Target Date  09/29/18      OT Kamphuis TERM GOAL #6   Title  Patient will complete lower body dressing with modified independence using adaptive equipment if needed.     Baseline  04/09/2018 - Pt requires min A to don pants. Pt requires increased time to tie shoes independently. 05/28/18 modified independent    Time  12    Period  Weeks    Status  Achieved      OT Mayden TERM GOAL #7   Title  Pt. will independently put her earrings in.    Baseline  07/07/2018: Pt. is unable, and has difficulty putting her earrings in.    Time  12    Period  Weeks    Status  New    Target Date  09/29/18      OT Cassis TERM GOAL #8   Title  Pt. will be indpendent with using her left hand to apply her lipstick.    Baseline  07/07/2018: Pt. is unable  to complete    Time  12    Period  Weeks    Status  New    Target Date  09/29/18      OT Smithers TERM GOAL  #9   Baseline  Pt. will independently open containers, and jars.    Time  12    Period  Weeks    Status  New    Target  Date  09/29/18            Plan - 07/29/18 1806    Clinical Impression Statement  Pt. had an MRI for her low back yesterday. Pt. reports she will need to follow-up with her physician once the results have been completed. Pt. continues to work on improving LUE motor control, and [facility] skills in order to improve indpendence with with manipulating  objects, perform self-feeding, apply lipstick, perform haircare, and write legibly, and efficiently.     Occupational Profile and client history currently impacting functional performance  patient is legally blind, progressive disease process with MS for over 20+ years, lives alone, limited availability for transportation.      Occupational performance deficits (Please refer to evaluation for details):  ADL's;Leisure    Rehab Potential  Poor    OT Frequency  2x / week    OT Duration  12 weeks    OT Treatment/Interventions  Therapeutic exercise;Cognitive remediation/compensation;Functional Mobility Training    Plan  Positive:  motivation, family support.  Negative:  lives alone, legally blind, requires caregiver 7 days a week for a few hours a day for self care    Clinical Decision Making  Several treatment options, min-mod task modification necessary    Consulted and Agree with Plan of Care  Patient       Patient will benefit from skilled therapeutic intervention in order to improve the following deficits and impairments:  Decreased cognition, Decreased knowledge of use of DME, Impaired vision/preception, Pain, Decreased coordination, Decreased activity tolerance, Decreased endurance, Decreased strength, Decreased balance, Impaired UE functional use  Visit Diagnosis: Muscle weakness (generalized)  Other lack of coordination    Problem List Patient Active Problem List   Diagnosis Date Noted  . Lower extremity edema 04/15/2017  . Macrocytic anemia 02/11/2017  . Vitamin D deficiency 02/02/2016  . Mixed hyperlipidemia 01/03/2015  . MS (multiple sclerosis) ([facility]) 01/03/2015  . Chronic pain 01/03/2015  . GERD without esophagitis 01/03/2015  . COPD (chronic obstructive pulmonary disease) ([facility]) 01/03/2015  . Osteopenia 01/03/2015  . Neurogenic bladder 01/03/2015  . Incontinence in female 01/03/2015    Olegario MessierElaine Heber Hoog, MS, OTR/L 07/29/2018, 6:14 PM  [facility] [address] [place], [place], [address] Phone: [phone]   Fax:  [phone]  Name: [name] MRN: [id number] Date of Birth: Nov 18, 1955

## 2018-08-03 ENCOUNTER — Encounter: Payer: Self-pay | Admitting: Occupational Therapy

## 2018-08-03 ENCOUNTER — Ambulatory Visit: Payer: Medicare Other | Admitting: Occupational Therapy

## 2018-08-03 DIAGNOSIS — M6281 Muscle weakness (generalized): Secondary | ICD-10-CM | POA: Diagnosis not present

## 2018-08-03 DIAGNOSIS — R278 Other lack of coordination: Secondary | ICD-10-CM

## 2018-08-03 NOTE — Therapy (Signed)
[facility] MAIN [facility] SERVICES [address] [place], [place], [address] Phone: [phone]   Fax:  [phone]  Occupational Therapy Treatment  Patient Details  Name: [name] MRN: [id number] Date of Birth: 19-May-1956 No data recorded  Encounter Date: 08/03/2018  OT End of Session - 08/03/18 1207    Visit Number  64    Number of Visits  72    Date for OT Re-Evaluation  09/29/18    Authorization Type  Progress report period staring 06/02/2018    OT Start Time  1148    OT Stop Time  1230    OT Time Calculation (min)  42 min    Activity Tolerance  Treatment limited secondary to medical complications (Comment)    Behavior During Therapy  [facility] for tasks assessed/performed       Past Medical History:  Diagnosis Date  . Headache   . Hyperlipidemia   . MS (multiple sclerosis) (HCC) 1990  . Neurogenic bladder     Past Surgical History:  Procedure Laterality Date  . ABDOMINAL HYSTERECTOMY    . OOPHORECTOMY Bilateral     There were no vitals filed for this visit.  Subjective Assessment - 08/03/18 1204    Subjective   Pt. reports that she has not heard anything since her MRI last week.    Pertinent History  Patient reports she was diagnosed with MS years ago and has recently started to feel weak in her left dominant arm and hand.  She denies any previous therapy and reports memory issues and can provide a limited history.     Patient Stated Goals  Patient reports she wants to be able to walk better, use her left hand and be able to write again.     Currently in Pain?  Yes    Pain Score  4     Pain Location  Back    Pain Orientation  Left    Pain Descriptors / Indicators  Aching    Pain Type  Chronic pain    Pain Onset  More than a month ago       OT TREATMENT    Neuro muscular re-education:  Pt. worked on left hand [facility] skills grasping 1/2" flat washers, reaching up to place them on the hooks positioned at an elevated surface.  Pt. position of the hooks were repositioned a high, and low angles, as well as turned, and positioned to the left, and right. Pt. worked on grasping the washers with his thumb, and 2nd digit from a magnetic dish. Pt. Worked on grasping one inch resistive cubes alternating thumb opposition to the tip of the 2nd through 5th digits. The board was positioned at a vertical angle. Pt. worked on pressing them back into place while isolating 2nd through 5th digits. Verbal cues, and visual demonstration was required.  Selfcare:  Pt. worked on applying lipstick with her Left hand. Pt. worked on stabilizing her left hand with her right hand when applying the lipstick in front of a mirror.                          OT Education - 08/03/18 1207    Education provided  Yes    Person(s) Educated  Patient    Methods  Explanation    Comprehension  Need further instruction          OT Earnest Term Goals - 07/07/18 1411  OT Crutchley TERM GOAL #1   Title  Patient will demonstrate holding utensils in her left hand and feeding herself with modified independence with minimal spillage.      Baseline  04/09/2018: Pt spills >50% of food when using L hand. Pt is able to cut soft meats while using her fork.    Time  8    Period  Weeks    Status  Achieved      OT Ricketts TERM GOAL #2   Title  Patient will complete tub/shower transfer with supervision only.    Baseline  04/09/2018 Pt requires min A to lift legs over tub for transfers    Time  8    Period  Weeks    Status  Deferred      OT Dillinger TERM GOAL #3   Title  Pt. will independently opens packets, and bags indpendently    Baseline  MinA    Time  12    Period  Weeks    Status  New    Target Date  09/29/18      OT Flavell TERM GOAL #4   Title  Patient will increase left UE strength by 1 mm grade to be able to reach into closet to retrieve clothing items.     Baseline  07/07/2018: Pt LUE strength 4/5. Pt can reach items in her closet  independently    Time  12    Period  Weeks    Status  On-going    Target Date  09/29/18      OT Hudler TERM GOAL #5   Title  Patient will improve coordination on the left to be able to hold pen securely and write her name with 75% legibility.     Baseline  07/07/2018: Pt prints name with 60% legibility, cursive words with 25% legibility using standard pen with a grip    Time  12    Period  Weeks    Status  On-going    Target Date  09/29/18      OT Thursby TERM GOAL #6   Title  Patient will complete lower body dressing with modified independence using adaptive equipment if needed.     Baseline  04/09/2018 - Pt requires min A to don pants. Pt requires increased time to tie shoes independently. 05/28/18 modified independent    Time  12    Period  Weeks    Status  Achieved      OT Demirjian TERM GOAL #7   Title  Pt. will independently put her earrings in.    Baseline  07/07/2018: Pt. is unable, and has difficulty putting her earrings in.    Time  12    Period  Weeks    Status  New    Target Date  09/29/18      OT Klinke TERM GOAL #8   Title  Pt. will be indpendent with using her left hand to apply her lipstick.    Baseline  07/07/2018: Pt. is unable  to complete    Time  12    Period  Weeks    Status  New    Target Date  09/29/18      OT Brunton TERM GOAL  #9   Baseline  Pt. will independently open containers, and jars.    Time  12    Period  Weeks    Status  New    Target Date  09/29/18  Plan - 08/03/18 1208    Clinical Impression Statement Pt. reports having had a fall over the weekend. Pt. reports that she did not get hurt when she fell because she landed on the couch. Pt. did not have her walker with her. Pt. presents with weakness in the LUE. Pt. continues to work on improving her left LUE motor control, hand function, and [facility] skills in order to be able to apply lipstick, put on earrings, and write legibly.     Occupational Profile and client history currently impacting  functional performance  patient is legally blind, progressive disease process with MS for over 20+ years, lives alone, limited availability for transportation.      Occupational performance deficits (Please refer to evaluation for details):  ADL's;Leisure    Rehab Potential  Poor    OT Frequency  2x / week    OT Duration  12 weeks    OT Treatment/Interventions  Therapeutic exercise;Cognitive remediation/compensation;Functional Mobility Training    Plan  Positive:  motivation, family support.  Negative:  lives alone, legally blind, requires caregiver 7 days a week for a few hours a day for self care    Clinical Decision Making  Several treatment options, min-mod task modification necessary    Consulted and Agree with Plan of Care  Patient       Patient will benefit from skilled therapeutic intervention in order to improve the following deficits and impairments:  Decreased cognition, Decreased knowledge of use of DME, Impaired vision/preception, Pain, Decreased coordination, Decreased activity tolerance, Decreased endurance, Decreased strength, Decreased balance, Impaired UE functional use  Visit Diagnosis: Muscle weakness (generalized)  Other lack of coordination    Problem List Patient Active Problem List   Diagnosis Date Noted  . Lower extremity edema 04/15/2017  . Macrocytic anemia 02/11/2017  . Vitamin D deficiency 02/02/2016  . Mixed hyperlipidemia 01/03/2015  . MS (multiple sclerosis) (HCC) 01/03/2015  . Chronic pain 01/03/2015  . GERD without esophagitis 01/03/2015  . COPD (chronic obstructive pulmonary disease) (HCC) 01/03/2015  . Osteopenia 01/03/2015  . Neurogenic bladder 01/03/2015  . Incontinence in female 01/03/2015    Olegario Messier, MS, OTR/L 08/03/2018, 12:23 PM  [facility] MAIN [facility] SERVICES [address] [place], [place], [address] Phone: [phone]   Fax:  [phone]  Name: [name] MRN: [id number] Date  of Birth: [birth year]

## 2018-08-05 ENCOUNTER — Telehealth: Payer: Self-pay | Admitting: Family Medicine

## 2018-08-05 NOTE — Telephone Encounter (Signed)
Received fax from [facility] Department of Social Services DSS from [place] on 07/31/18, requesting copy of medical records and information about this patient. Recent APS case opened 07/22/18.  I reviewed her history and my chart review as well, and I have seen this patient about 5-6 times in past 3 years, she does not routinely no show apt, she had a time period of not scheduling for >6 months in 2019.  They mentioned concern of bed bugs, I have not treated her for bed bugs before.  She is followed by [facility] Malvin Johns for MS.  She is a complex medical patient that does seem to require additional assistance. She is currently living alone, but benefits from home aide often that helps her with meal prep and medications and other things if needed. I do think she does need this service, and I am hesitant for her to be considered completely independent.  I will see her on 08/19/18 in follow-up, and evaluate her further with focus on this, and consider if I have any hesitation or concerns about her medical decision capacity and ability to manage financial.  Will fax last record 01/2018 to DSS as requested and provide update to them if new concerns clinically  [name], DO [facility] 08/05/2018, 12:13 PM

## 2018-08-06 ENCOUNTER — Ambulatory Visit: Payer: Medicare Other | Admitting: Occupational Therapy

## 2018-08-06 ENCOUNTER — Ambulatory Visit: Payer: Medicare Other | Admitting: Physical Therapy

## 2018-08-10 ENCOUNTER — Encounter: Payer: Self-pay | Admitting: Occupational Therapy

## 2018-08-10 ENCOUNTER — Ambulatory Visit: Payer: Medicare Other | Admitting: Occupational Therapy

## 2018-08-10 DIAGNOSIS — M6281 Muscle weakness (generalized): Secondary | ICD-10-CM | POA: Diagnosis not present

## 2018-08-10 DIAGNOSIS — R278 Other lack of coordination: Secondary | ICD-10-CM | POA: Diagnosis not present

## 2018-08-10 NOTE — Therapy (Signed)
[facility] MAIN [facility] SERVICES [address] [place], [place], [address] Phone: [phone]   Fax:  [phone]  Occupational Therapy Treatment  Patient Details  Name: [name] MRN: [id number] Date of Birth: 15-Oct-1955 No data recorded  Encounter Date: 08/10/2018  OT End of Session - 08/10/18 1444    Visit Number  66    Number of Visits  72    Date for OT Re-Evaluation  09/29/18    Authorization Type  Progress report period staring 06/02/2018    OT Start Time  1430    OT Stop Time  1515    OT Time Calculation (min)  45 min    Equipment Utilized During Treatment  magnifying glass    Activity Tolerance  Treatment limited secondary to medical complications (Comment)    Behavior During Therapy  [facility] for tasks assessed/performed       Past Medical History:  Diagnosis Date  . Headache   . Hyperlipidemia   . MS (multiple sclerosis) (HCC) 1990  . Neurogenic bladder     Past Surgical History:  Procedure Laterality Date  . ABDOMINAL HYSTERECTOMY    . OOPHORECTOMY Bilateral     There were no vitals filed for this visit.  Subjective Assessment - 08/10/18 1439    Subjective   Pt. reports that she has had a good weekend, no further falls.    Pertinent History  Patient reports she was diagnosed with MS years ago and has recently started to feel weak in her left dominant arm and hand.  She denies any previous therapy and reports memory issues and can provide a limited history.     Patient Stated Goals  Patient reports she wants to be able to walk better, use her left hand and be able to write again.     Currently in Pain?  Yes    Pain Score  4     Pain Location  Back    Pain Orientation  Left    Pain Descriptors / Indicators  Aching    Pain Type  Chronic pain    Pain Onset  More than a month ago      OT TREATMENT    Neuro muscular re-education:  Pt. worked on left hand grasping, flipping and stacking 2" large pegs on the [employer]  board placed at a tabletop surface.  Selfcare:  Pt. worked on Public affairs consultant words. Pt. was able to maintain a mature grasp on a standard pen throughout the duration of the task. Pt. had difficulty with letter formation, letter size, and had several episodes of deviating below the line. Pt. had to hold the paper up the see the spelling of each word before writing it down.  Pt. continues to work on improving [facility] skills. Pt. presented with 50% legibility in printed form.                        OT Education - 08/10/18 1443    Education provided  Yes    Education Details  Left hand [facility] skills, reaching skills    Person(s) Educated  Patient    Methods  Explanation    Comprehension  Need further instruction          OT Benevides Term Goals - 07/07/18 1411      OT Lindy TERM GOAL #1   Title  Patient will demonstrate holding utensils in her left hand and feeding herself with modified  independence with minimal spillage.      Baseline  04/09/2018: Pt spills >50% of food when using L hand. Pt is able to cut soft meats while using her fork.    Time  8    Period  Weeks    Status  Achieved      OT Chavarria TERM GOAL #2   Title  Patient will complete tub/shower transfer with supervision only.    Baseline  04/09/2018 Pt requires min A to lift legs over tub for transfers    Time  8    Period  Weeks    Status  Deferred      OT Arico TERM GOAL #3   Title  Pt. will independently opens packets, and bags indpendently    Baseline  MinA    Time  12    Period  Weeks    Status  New    Target Date  09/29/18      OT Zerby TERM GOAL #4   Title  Patient will increase left UE strength by 1 mm grade to be able to reach into closet to retrieve clothing items.     Baseline  07/07/2018: Pt LUE strength 4/5. Pt can reach items in her closet independently    Time  12    Period  Weeks    Status  On-going    Target Date  09/29/18      OT Ackers TERM GOAL #5   Title  Patient will  improve coordination on the left to be able to hold pen securely and write her name with 75% legibility.     Baseline  07/07/2018: Pt prints name with 60% legibility, cursive words with 25% legibility using standard pen with a grip    Time  12    Period  Weeks    Status  On-going    Target Date  09/29/18      OT Scritchfield TERM GOAL #6   Title  Patient will complete lower body dressing with modified independence using adaptive equipment if needed.     Baseline  04/09/2018 - Pt requires min A to don pants. Pt requires increased time to tie shoes independently. 05/28/18 modified independent    Time  12    Period  Weeks    Status  Achieved      OT Manchester TERM GOAL #7   Title  Pt. will independently put her earrings in.    Baseline  07/07/2018: Pt. is unable, and has difficulty putting her earrings in.    Time  12    Period  Weeks    Status  New    Target Date  09/29/18      OT Sulewski TERM GOAL #8   Title  Pt. will be indpendent with using her left hand to apply her lipstick.    Baseline  07/07/2018: Pt. is unable  to complete    Time  12    Period  Weeks    Status  New    Target Date  09/29/18      OT Candela TERM GOAL  #9   Baseline  Pt. will independently open containers, and jars.    Time  12    Period  Weeks    Status  New    Target Date  09/29/18            Plan - 08/10/18 1444    Clinical Impression Statement Pt. continues to present with limited left hand motor control,  and [facility] skills. Pt. continues to work on improving LUE Motor control, hand function, and [facility] skills in order to be able to apply lipstick, write efficiently, and  perform ADL, and IADL skills.     Occupational Profile and client history currently impacting functional performance  patient is legally blind, progressive disease process with MS for over 20+ years, lives alone, limited availability for transportation.      Occupational performance deficits (Please refer to evaluation for details):  ADL's;Leisure    Rehab  Potential  Poor    OT Frequency  2x / week    OT Duration  12 weeks    OT Treatment/Interventions  Therapeutic exercise;Cognitive remediation/compensation;Functional Mobility Training    Plan  Positive:  motivation, family support.  Negative:  lives alone, legally blind, requires caregiver 7 days a week for a few hours a day for self care    Clinical Decision Making  Several treatment options, min-mod task modification necessary    Consulted and Agree with Plan of Care  Patient       Patient will benefit from skilled therapeutic intervention in order to improve the following deficits and impairments:  Decreased cognition, Decreased knowledge of use of DME, Impaired vision/preception, Pain, Decreased coordination, Decreased activity tolerance, Decreased endurance, Decreased strength, Decreased balance, Impaired UE functional use  Visit Diagnosis: Muscle weakness (generalized)  Other lack of coordination    Problem List Patient Active Problem List   Diagnosis Date Noted  . Lower extremity edema 04/15/2017  . Macrocytic anemia 02/11/2017  . Vitamin D deficiency 02/02/2016  . Mixed hyperlipidemia 01/03/2015  . MS (multiple sclerosis) (HCC) 01/03/2015  . Chronic pain 01/03/2015  . GERD without esophagitis 01/03/2015  . COPD (chronic obstructive pulmonary disease) (HCC) 01/03/2015  . Osteopenia 01/03/2015  . Neurogenic bladder 01/03/2015  . Incontinence in female 01/03/2015    Olegario Messier, MS, OTR/L 08/10/2018, 3:05 PM  [facility] MAIN [facility] SERVICES [address] [place], [place], [address] Phone: [phone]   Fax:  [phone]  Name: NUSAIBA BRILLIANT MRN: [id number] Date of Birth: [birth year]

## 2018-08-12 ENCOUNTER — Ambulatory Visit: Payer: Medicare Other | Admitting: Occupational Therapy

## 2018-08-12 ENCOUNTER — Encounter: Payer: Self-pay | Admitting: Occupational Therapy

## 2018-08-12 ENCOUNTER — Ambulatory Visit: Payer: Medicare Other | Admitting: Physical Therapy

## 2018-08-12 DIAGNOSIS — M6281 Muscle weakness (generalized): Secondary | ICD-10-CM | POA: Diagnosis not present

## 2018-08-12 DIAGNOSIS — R278 Other lack of coordination: Secondary | ICD-10-CM

## 2018-08-12 NOTE — Therapy (Signed)
[place] [facility] MAIN [facility] SERVICES [address] [place], [place], [address] Phone: [phone]   Fax:  [phone]  Occupational Therapy Treatment  Patient Details  Name: [name] MRN: [id number] Date of Birth: February 21, 1956 No data recorded  Encounter Date: 08/12/2018  OT End of Session - 08/12/18 1118    Visit Number  67    Number of Visits  72    Date for OT Re-Evaluation  09/29/18    Authorization Type  Progress report period staring 06/02/2018    OT Start Time  1103    OT Stop Time  1145    OT Time Calculation (min)  42 min    Activity Tolerance  Treatment limited secondary to medical complications (Comment)    Behavior During Therapy  [facility] for tasks assessed/performed       Past Medical History:  Diagnosis Date  . Headache   . Hyperlipidemia   . MS (multiple sclerosis) (HCC) 1990  . Neurogenic bladder     Past Surgical History:  Procedure Laterality Date  . ABDOMINAL HYSTERECTOMY    . OOPHORECTOMY Bilateral     There were no vitals filed for this visit.  Subjective Assessment - 08/12/18 1115    Subjective   Pt. reports that she has back pain.    Pertinent History  Patient reports she was diagnosed with MS years ago and has recently started to feel weak in her left dominant arm and hand.  She denies any previous therapy and reports memory issues and can provide a limited history.     Patient Stated Goals  Patient reports she wants to be able to walk better, use her left hand and be able to write again.     Currently in Pain?  Yes    Pain Score  4     Pain Location  Back    Pain Orientation  Left    Pain Descriptors / Indicators  Aching    Pain Onset  More than a month ago       OT TREATMENT    Neuro muscular re-education:  Pt. worked on [facility] grasping 1/2" small magnetic pegs, disconnecting them, and placing them on a positioned at a vertical angle at a tabletop, on an elevated angle to encourage [facility] skills with arms  sustained in elevation. Pt. worked on removing the pegs while alternating thumb opposition to the tip of the 2nd through 5th digits.  Pt. required cues for hand position during the task. Pt. worked on grasping, and positioning magnetic hooks on a whiteboard positioned at a vertical angle on the tabletop. Pt. worked on [facility] skills grasping 1/2" flat washers, and placing them on the hooks.                        OT Education - 08/12/18 1117    Education provided  Yes    Education Details  Left hand [facility] skills, reaching skills    Person(s) Educated  Patient    Methods  Explanation    Comprehension  Need further instruction          OT Jakes Term Goals - 07/07/18 1411      OT Bourke TERM GOAL #1   Title  Patient will demonstrate holding utensils in her left hand and feeding herself with modified independence with minimal spillage.      Baseline  04/09/2018: Pt spills >50% of food when using L hand. Pt is able  to cut soft meats while using her fork.    Time  8    Period  Weeks    Status  Achieved      OT Cowden TERM GOAL #2   Title  Patient will complete tub/shower transfer with supervision only.    Baseline  04/09/2018 Pt requires min A to lift legs over tub for transfers    Time  8    Period  Weeks    Status  Deferred      OT Croston TERM GOAL #3   Title  Pt. will independently opens packets, and bags indpendently    Baseline  MinA    Time  12    Period  Weeks    Status  New    Target Date  09/29/18      OT Zia TERM GOAL #4   Title  Patient will increase left UE strength by 1 mm grade to be able to reach into closet to retrieve clothing items.     Baseline  07/07/2018: Pt LUE strength 4/5. Pt can reach items in her closet independently    Time  12    Period  Weeks    Status  On-going    Target Date  09/29/18      OT Kearse TERM GOAL #5   Title  Patient will improve coordination on the left to be able to hold pen securely and write her name with 75% legibility.      Baseline  07/07/2018: Pt prints name with 60% legibility, cursive words with 25% legibility using standard pen with a grip    Time  12    Period  Weeks    Status  On-going    Target Date  09/29/18      OT Sibley TERM GOAL #6   Title  Patient will complete lower body dressing with modified independence using adaptive equipment if needed.     Baseline  04/09/2018 - Pt requires min A to don pants. Pt requires increased time to tie shoes independently. 05/28/18 modified independent    Time  12    Period  Weeks    Status  Achieved      OT Crable TERM GOAL #7   Title  Pt. will independently put her earrings in.    Baseline  07/07/2018: Pt. is unable, and has difficulty putting her earrings in.    Time  12    Period  Weeks    Status  New    Target Date  09/29/18      OT Sugrue TERM GOAL #8   Title  Pt. will be indpendent with using her left hand to apply her lipstick.    Baseline  07/07/2018: Pt. is unable  to complete    Time  12    Period  Weeks    Status  New    Target Date  09/29/18      OT Bathe TERM GOAL  #9   Baseline  Pt. will independently open containers, and jars.    Time  12    Period  Weeks    Status  New    Target Date  09/29/18            Plan - 08/12/18 1119    Clinical Impression Statement  Pt. reports that she has had no additional falls. Pt. reports reports that they found out her back pain is coming from her MS. Pt. worked on Pt. continues to work on improving  Left UE strength, and [facility] skills in order to be able to apply lipstick, and write efficiently. pt. continues to work towards improving safety, and maximizing independence with ADL, and IADL functioning..    Occupational Profile and client history currently impacting functional performance  patient is legally blind, progressive disease process with MS for over 20+ years, lives alone, limited availability for transportation.      Occupational performance deficits (Please refer to evaluation for details):   ADL's;Leisure    Rehab Potential  Poor    OT Frequency  2x / week    OT Duration  12 weeks    OT Treatment/Interventions  Therapeutic exercise;Cognitive remediation/compensation;Functional Mobility Training    Plan  Positive:  motivation, family support.  Negative:  lives alone, legally blind, requires caregiver 7 days a week for a few hours a day for self care    Clinical Decision Making  Several treatment options, min-mod task modification necessary    Consulted and Agree with Plan of Care  Patient       Patient will benefit from skilled therapeutic intervention in order to improve the following deficits and impairments:  Decreased cognition, Decreased knowledge of use of DME, Impaired vision/preception, Pain, Decreased coordination, Decreased activity tolerance, Decreased endurance, Decreased strength, Decreased balance, Impaired UE functional use  Visit Diagnosis: Muscle weakness (generalized)  Other lack of coordination    Problem List Patient Active Problem List   Diagnosis Date Noted  . Lower extremity edema 04/15/2017  . Macrocytic anemia 02/11/2017  . Vitamin D deficiency 02/02/2016  . Mixed hyperlipidemia 01/03/2015  . MS (multiple sclerosis) (HCC) 01/03/2015  . Chronic pain 01/03/2015  . GERD without esophagitis 01/03/2015  . COPD (chronic obstructive pulmonary disease) (HCC) 01/03/2015  . Osteopenia 01/03/2015  . Neurogenic bladder 01/03/2015  . Incontinence in female 01/03/2015    Olegario MessierElaine Neils [place], MS, OTR/L 08/12/2018, 11:33 AM  [place] [facility] MAIN [facility] SERVICES [address] [place], [place], [id number] Phone: [phone]   Fax:  [phone]  Name: Sheffield SliderDeborah B Hofman MRN: [id number] Date of Birth: 02-Jan-1956

## 2018-08-18 ENCOUNTER — Ambulatory Visit: Payer: Medicare Other | Admitting: Physical Therapy

## 2018-08-18 ENCOUNTER — Ambulatory Visit: Payer: Medicare Other | Admitting: Occupational Therapy

## 2018-08-18 ENCOUNTER — Encounter: Payer: Self-pay | Admitting: Occupational Therapy

## 2018-08-18 DIAGNOSIS — M6281 Muscle weakness (generalized): Secondary | ICD-10-CM

## 2018-08-18 DIAGNOSIS — R278 Other lack of coordination: Secondary | ICD-10-CM

## 2018-08-18 NOTE — Therapy (Signed)
[facility] MAIN [facility] SERVICES [address] [place], [place], [address] Phone: [phone]   Fax:  [phone]  Occupational Therapy Treatment  Patient Details  Name: [name] MRN: [id number] Date of Birth: 1956/03/24 No data recorded  Encounter Date: 08/18/2018  OT End of Session - 08/18/18 1313    Visit Number  68    Number of Visits  72    Date for OT Re-Evaluation  09/29/18    Authorization Type  Progress report period staring 06/02/2018    OT Start Time  1300    OT Stop Time  1345    OT Time Calculation (min)  45 min    Equipment Utilized During Treatment  magnifying glass    Activity Tolerance  Treatment limited secondary to medical complications (Comment)    Behavior During Therapy  [facility] for tasks assessed/performed       Past Medical History:  Diagnosis Date  . Headache   . Hyperlipidemia   . MS (multiple sclerosis) (HCC) 1990  . Neurogenic bladder     Past Surgical History:  Procedure Laterality Date  . ABDOMINAL HYSTERECTOMY    . OOPHORECTOMY Bilateral     There were no vitals filed for this visit.  Subjective Assessment - 08/18/18 1307    Subjective   Pt. continues to have back pain.    Pertinent History  Patient reports she was diagnosed with MS years ago and has recently started to feel weak in her left dominant arm and hand.  She denies any previous therapy and reports memory issues and can provide a limited history.     Patient Stated Goals  Patient reports she wants to be able to walk better, use her left hand and be able to write again.     Currently in Pain?  Yes    Pain Score  4     Pain Location  Back    Pain Orientation  Left    Pain Descriptors / Indicators  Aching    Pain Type  Chronic pain    Pain Onset  More than a month ago    Pain Frequency  Constant      OT TREATMENT    Neuro muscular re-education:  Pt. worked on LUE [facility] grasping 1/2" small magnetic pegs, disconnecting them, and placing  them on a positioned at a vertical angle at a tabletop, on an elevated angle to encourage [facility] skills with arms sustained in elevation. Pt. worked on removing the pegs while alternating thumb opposition to the tip of the 2nd through 5th digits.  Pt. worked on grasping, and positioning magnetic hooks on a whiteboard positioned at a vertical angle on the tabletop. Pt. worked on [facility] skills grasping 1/2" flat washers, and placing them on the hooks. Pt. requires increased time, and cues. Pt. Was able to complete that task with the hooks positioned in the highest position on the board. Pt. dropped a couple washers when attempting to remove them.                         OT Education - 08/18/18 1312    Education provided  Yes    Education Details  Left hand [facility] skills, reaching skills    Person(s) Educated  Patient    Methods  Explanation    Comprehension  Need further instruction          OT Fulginiti Term Goals - 07/07/18 1411  OT Macchi TERM GOAL #1   Title  Patient will demonstrate holding utensils in her left hand and feeding herself with modified independence with minimal spillage.      Baseline  04/09/2018: Pt spills >50% of food when using L hand. Pt is able to cut soft meats while using her fork.    Time  8    Period  Weeks    Status  Achieved      OT Konieczny TERM GOAL #2   Title  Patient will complete tub/shower transfer with supervision only.    Baseline  04/09/2018 Pt requires min A to lift legs over tub for transfers    Time  8    Period  Weeks    Status  Deferred      OT Coudriet TERM GOAL #3   Title  Pt. will independently opens packets, and bags indpendently    Baseline  MinA    Time  12    Period  Weeks    Status  New    Target Date  09/29/18      OT Hird TERM GOAL #4   Title  Patient will increase left UE strength by 1 mm grade to be able to reach into closet to retrieve clothing items.     Baseline  07/07/2018: Pt LUE strength 4/5. Pt can reach items in  her closet independently    Time  12    Period  Weeks    Status  On-going    Target Date  09/29/18      OT Derhammer TERM GOAL #5   Title  Patient will improve coordination on the left to be able to hold pen securely and write her name with 75% legibility.     Baseline  07/07/2018: Pt prints name with 60% legibility, cursive words with 25% legibility using standard pen with a grip    Time  12    Period  Weeks    Status  On-going    Target Date  09/29/18      OT Obarr TERM GOAL #6   Title  Patient will complete lower body dressing with modified independence using adaptive equipment if needed.     Baseline  04/09/2018 - Pt requires min A to don pants. Pt requires increased time to tie shoes independently. 05/28/18 modified independent    Time  12    Period  Weeks    Status  Achieved      OT Lora TERM GOAL #7   Title  Pt. will independently put her earrings in.    Baseline  07/07/2018: Pt. is unable, and has difficulty putting her earrings in.    Time  12    Period  Weeks    Status  New    Target Date  09/29/18      OT Ornstein TERM GOAL #8   Title  Pt. will be indpendent with using her left hand to apply her lipstick.    Baseline  07/07/2018: Pt. is unable  to complete    Time  12    Period  Weeks    Status  New    Target Date  09/29/18      OT Alvizo TERM GOAL  #9   Baseline  Pt. will independently open containers, and jars.    Time  12    Period  Weeks    Status  New    Target Date  09/29/18  Plan - 08/18/18 1315    Clinical Impression Statement Pt. reports having a follow-up appointment with her MD for her back pain tomorrow. Pt. continues to work on improving LUE strength, and [facility] skills in order to be able to apply lipstick, write efficiently, and work towards improving safety, and maximizing independence.    Occupational Profile and client history currently impacting functional performance  patient is legally blind, progressive disease process with MS for over 20+  years, lives alone, limited availability for transportation.      Occupational performance deficits (Please refer to evaluation for details):  ADL's;Leisure    Rehab Potential  Poor    OT Frequency  2x / week    OT Duration  12 weeks    OT Treatment/Interventions  Therapeutic exercise;Cognitive remediation/compensation;Functional Mobility Training    Plan  Positive:  motivation, family support.  Negative:  lives alone, legally blind, requires caregiver 7 days a week for a few hours a day for self care    Clinical Decision Making  Several treatment options, min-mod task modification necessary    Consulted and Agree with Plan of Care  Patient       Patient will benefit from skilled therapeutic intervention in order to improve the following deficits and impairments:  Decreased cognition, Decreased knowledge of use of DME, Impaired vision/preception, Pain, Decreased coordination, Decreased activity tolerance, Decreased endurance, Decreased strength, Decreased balance, Impaired UE functional use  Visit Diagnosis: Muscle weakness (generalized)  Other lack of coordination    Problem List Patient Active Problem List   Diagnosis Date Noted  . Lower extremity edema 04/15/2017  . Macrocytic anemia 02/11/2017  . Vitamin D deficiency 02/02/2016  . Mixed hyperlipidemia 01/03/2015  . MS (multiple sclerosis) (HCC) 01/03/2015  . Chronic pain 01/03/2015  . GERD without esophagitis 01/03/2015  . COPD (chronic obstructive pulmonary disease) (HCC) 01/03/2015  . Osteopenia 01/03/2015  . Neurogenic bladder 01/03/2015  . Incontinence in female 01/03/2015    Olegario Messier, MS, OTR/L 08/18/2018, 1:37 PM  [place] [facility] MAIN [facility] SERVICES [address] [place], [place], [address] Phone: [phone]   Fax:  [phone]  Name: KAITE SNYDER MRN: [id number] Date of Birth: 1955/08/30

## 2018-08-19 ENCOUNTER — Other Ambulatory Visit: Payer: Self-pay | Admitting: Family Medicine

## 2018-08-19 ENCOUNTER — Other Ambulatory Visit: Payer: Self-pay

## 2018-08-19 ENCOUNTER — Encounter: Payer: Self-pay | Admitting: Family Medicine

## 2018-08-19 ENCOUNTER — Ambulatory Visit (INDEPENDENT_AMBULATORY_CARE_PROVIDER_SITE_OTHER): Payer: Medicare Other | Admitting: Family Medicine

## 2018-08-19 VITALS — BP 104/54 | HR 67 | Temp 98.6°F | Resp 16 | Ht 64.0 in | Wt 153.0 lb

## 2018-08-19 DIAGNOSIS — J432 Centrilobular emphysema: Secondary | ICD-10-CM

## 2018-08-19 DIAGNOSIS — G894 Chronic pain syndrome: Secondary | ICD-10-CM | POA: Diagnosis not present

## 2018-08-19 DIAGNOSIS — E559 Vitamin D deficiency, unspecified: Secondary | ICD-10-CM

## 2018-08-19 DIAGNOSIS — Z1239 Encounter for other screening for malignant neoplasm of breast: Secondary | ICD-10-CM | POA: Diagnosis not present

## 2018-08-19 DIAGNOSIS — Z79899 Other long term (current) drug therapy: Secondary | ICD-10-CM

## 2018-08-19 DIAGNOSIS — Z1211 Encounter for screening for malignant neoplasm of colon: Secondary | ICD-10-CM

## 2018-08-19 DIAGNOSIS — Z23 Encounter for immunization: Secondary | ICD-10-CM | POA: Diagnosis not present

## 2018-08-19 DIAGNOSIS — Z0000 Encounter for general adult medical examination without abnormal findings: Secondary | ICD-10-CM

## 2018-08-19 DIAGNOSIS — N319 Neuromuscular dysfunction of bladder, unspecified: Secondary | ICD-10-CM

## 2018-08-19 DIAGNOSIS — G35 Multiple sclerosis: Secondary | ICD-10-CM

## 2018-08-19 DIAGNOSIS — K219 Gastro-esophageal reflux disease without esophagitis: Secondary | ICD-10-CM

## 2018-08-19 DIAGNOSIS — M62830 Muscle spasm of back: Secondary | ICD-10-CM

## 2018-08-19 DIAGNOSIS — E782 Mixed hyperlipidemia: Secondary | ICD-10-CM

## 2018-08-19 DIAGNOSIS — D539 Nutritional anemia, unspecified: Secondary | ICD-10-CM

## 2018-08-19 MED ORDER — BACLOFEN 10 MG PO TABS: 10.0000 mg | ORAL_TABLET | Freq: Every evening | ORAL | 2 refills | Status: DC | PRN

## 2018-08-19 NOTE — Patient Instructions (Addendum)
Thank you for coming to the office today.  For back pain and muscle spasm - start muscle relaxant - baclofen take whole pill 49m every night as needed - if problem is resolved then you can reduce the medication.  Caution with sedation if you take this medication during the day, but if we need we can consider this.  For Mammogram screening for breast cancer   Call the [place] anytime to schedule your own appointment now that order has been placed.  [facility] [place] [id number]: [phone] Colon Cancer Screening: - For all adults age 62+routine colon cancer screening is highly recommended.     - Recent guidelines from [place] starting age of 624- Early detection of colon cancer is important, because often there are no warning signs or symptoms, also if found early usually it can be cured. Late stage is hard to treat.  - If you are not interested in Colonoscopy screening (if done and normal you could be cleared for 5 to 10 years until next due), then Cologuard is an excellent alternative for screening test for Colon Cancer. It is highly sensitive for detecting DNA of colon cancer from even the earliest stages. Also, there is NO bowel prep required. - If Cologuard is NEGATIVE, then it is good for 3 years before next due - If Cologuard is POSITIVE, then it is strongly advised to get a Colonoscopy, which allows the GI doctor to locate the source of the cancer or polyp (even very early stage) and treat it by removing it. ------------------------- If you would like to proceed with Cologuard (stool DNA test) - FIRST, call your insurance company and tell them you want to check cost of Cologuard tell them CPT Code [phone](it may be completely covered and you could get for no cost, OR max cost without any coverage is about $600). Also, keep in mind if you do NOT open the kit,  and decide not to do the test, you will NOT be charged, you should contact the company if you decide not to do the test. - If you want to proceed, you can notify [place](phone message, [place] or at next visit) and we will order it for you. The test kit will be delivered to you house within about 1 week. Follow instructions to collect sample, you may call the company for any help or questions, 24/7 telephone support at [phone]   DUE for FASTING BLOOD WORK (no food or drink after midnight before the lab appointment, only water or coffee without cream/sugar on the morning of)  SCHEDULE "Lab Only" visit in the morning at the clinic for lab draw in 6 MONTHS   - Make sure Lab Only appointment is at about 1 week before your next appointment, so that results will be available  For Lab Results, once available within 2-3 days of blood draw, you can can log in to MyChart online to view your results and a brief explanation. Also, we can discuss results at next follow-up visit.   Please schedule a Follow-up Appointment to: Return in about 6 months (around 02/17/2019) for Annual Physical.  If you have any other questions or concerns, please feel free to call the office or send a message through [place] You may also schedule an earlier appointment if necessary.  Additionally, you may be receiving a survey about your experience at our office within  a few days to 1 week by e-mail or mail. We value your feedback.  Nobie Putnam, DO [place]

## 2018-08-19 NOTE — Progress Notes (Signed)
Subjective:    Patient ID: Vicki Slidereborah B Benson, female    DOB: [birth year], 63 y.o.   MRN: [id number]  Vicki SliderDeborah B Conyer is a 63 y.o. female presenting on 08/19/2018 for Multiple Sclerosis (follow up)  Patient accompanied by her primary caregiver, provides additional history today - home aide Joyce GrossKay.  HPI   Multiple Sclerosis, Chronic relapsing remitting / Neurogenic Bladder/ Chronic Pain Syndrome - [facility] (Dr Theora MasterZachary Potter) - Interval update, has continued with outpatient ambulatory PT 2-3 times per week, mostly working on upper extremity and helping back as well. Uses transportation for going to ambulatory PT. Her home aide takes her to doctors appointments. Chronic Left leg drop, working on improving this with PT - Last visit by Dr Malvin JohnsPotter in January 2020, no new concern or flare with MS - Regarding MS, on Ampyra medication according to Neuro - For pain and function she takes the following: - Taking Tramadol 50mg  nightly PRN - Taking Gabapentin 300mg  x 2 in AM, 2 in afternoon, and 3 PM, slightly increased dose now with good result - Difficulty getting out of bed at times, but once up she can function - No fall in past 1 year, significant - She understands her health decisions, but usually contacts her home aide to help with advice on financial and health decisions - Has issues with neurogenic bladder from MS, still issues with some overflow and urinary incontinence, she has urge to go, now more frequently, improved on Myrbetriq 50mg  daily, still on using pads / depends for medical supply for her urinary incontinence that is needed to help keep her dry and avoid recurrent UTI. She is doing better with hygiene and cleaning after soiling. Admits still episodic low back pain at times, worse at night, sore and stiff muscles, has tried muscle relaxant in past was helpful. Admits chronic L sided weakness, some episodes of intermittent swelling seem related to  inactivity, improve with rest and elevation - seems improved Admits urinary incontinence Denies dysuria, fever chills, hematuria  Centrilobular Emphysema (COPD) - Currently stable without recent problem or exacerbation. - Continues on Spiriva daily with good results. Rarely using albuterol inhaler or nebulizer only PRN flare  Major Depression, Chronic recurrent - In complete remission Prior history of mood with depression, no further concerns. She does not think she is "depressed" anymore, see answers to PHQ  Vitamin D Deficiency / Osteopenia  Last lab improved 33 (01/2018) Last imaging DEXA 12/2014, T-1.6 Taking Fosamax 70mg  weekly tolerating well  Health Maintenance: Due for Flu Shot, will receive today , high dose.  Due for colon cancer screening. Agree to check cologuard, will order test.  Due for mammogram routine screening, not scheduled since previous order. Agree to call to schedule once order placed now.   [facility] 08/19/2018 05/12/2018 02/12/2018  Decreased Interest 0 0 0  Down, Depressed, Hopeless 0 1 0  PHQ - 2 Score 0 1 0  Altered sleeping 0 - 0  Tired, decreased energy 0 - 0  Change in appetite 0 - 0  Feeling bad or failure about yourself  0 - 0  Trouble concentrating 0 - 1  Moving slowly or fidgety/restless 0 - 1  Suicidal thoughts 0 - 0  PHQ-9 Score 0 - 2  Difficult doing work/chores Not difficult at all - Not difficult at all    Social History   Tobacco Use  . Smoking status: Current Every Day Smoker    Packs/day: 1.00  Years: 20.00    Pack years: 20.00    Last attempt to quit: 01/03/1995    Years since quitting: 23.6  . Smokeless tobacco: Current User  Substance Use Topics  . Alcohol use: No    Alcohol/week: 0.0 standard drinks  . Drug use: Yes    Comment: smokes pods in past    Review of Systems Per HPI unless specifically indicated above     Objective:    BP (!) 104/54   Pulse 67   Temp 98.6 F (37 C) (Oral)    Resp 16   Ht 5\' 4"  (1.626 m)   Wt 153 lb (69.4 kg)   BMI 26.26 kg/m   Wt Readings from Last 3 Encounters:  08/19/18 153 lb (69.4 kg)  05/12/18 149 lb 6.4 oz (67.8 kg)  04/13/18 140 lb (63.5 kg)    Physical Exam Vitals signs and nursing note reviewed.  Constitutional:      General: She is not in acute distress.    Appearance: She is well-developed. She is not diaphoretic.     Comments: Currently well today, mostly comfortable, cooperative.  HENT:     Head: Normocephalic and atraumatic.  Eyes:     General:        Right eye: No discharge.        Left eye: No discharge.     Conjunctiva/sclera: Conjunctivae normal.  Neck:     Musculoskeletal: Normal range of motion and neck supple.  Cardiovascular:     Rate and Rhythm: Normal rate and regular rhythm.     Heart sounds: Normal heart sounds. No murmur.  Pulmonary:     Effort: Pulmonary effort is normal. No respiratory distress.     Breath sounds: Normal breath sounds. No wheezing or rales.  Abdominal:     General: Bowel sounds are normal. There is no distension.     Palpations: Abdomen is soft.     Tenderness: There is no abdominal tenderness.  Musculoskeletal:     Comments: Low Back / Lower Extremity Inspection: Normal appearance, no spinal deformity, symmetrical. Palpation: No tenderness over spinous processes. Stable bilateral lower back muscle spasm and hypertonicity, seems R>L ROM: Good intact ROM lower extremity flex/ext legs, hips Strength: Bilateral knee flex/ext 5/5. IMPROVED chronic residual weakness now nearly 5/5 Left hip/knee/ankle, grip and upper ext intact 5/5 Neurovascular: intact distal sensation to light touch  Uses cane w/ 4 prong for ambulation  Skin:    General: Skin is warm and dry.     Findings: No erythema or rash.  Neurological:     Mental Status: She is alert and oriented to person, place, and time.  Psychiatric:        Behavior: Behavior normal.    Results for orders placed or performed in visit  on 02/12/18  Hemoglobin A1c  Result Value Ref Range   Hgb A1c MFr Bld 5.3 <5.7 % of total Hgb   Mean Plasma Glucose 105 (calc)   eAG (mmol/L) 5.8 (calc)  CBC with Differential/Platelet  Result Value Ref Range   WBC 3.8 3.8 - 10.8 Thousand/uL   RBC 3.91 3.80 - 5.10 Million/uL   Hemoglobin 13.2 11.7 - 15.5 g/dL   HCT 20.9 47.0 - 96.2 %   MCV 98.5 80.0 - 100.0 fL   MCH 33.8 (H) 27.0 - 33.0 pg   MCHC 34.3 32.0 - 36.0 g/dL   RDW 83.6 62.9 - 47.6 %   Platelets 194 140 - 400 Thousand/uL   MPV 10.5 7.5 -  12.5 fL   Neutro Abs 2,443 1,500 - 7,800 cells/uL   Lymphs Abs 847 (L) 850 - 3,900 cells/uL   WBC mixed population 437 200 - 950 cells/uL   Eosinophils Absolute 61 15 - 500 cells/uL   Basophils Absolute 11 0 - 200 cells/uL   Neutrophils Relative % 64.3 %   Total Lymphocyte 22.3 %   Monocytes Relative 11.5 %   Eosinophils Relative 1.6 %   Basophils Relative 0.3 %  COMPLETE METABOLIC PANEL WITH GFR  Result Value Ref Range   Glucose, Bld 86 65 - 99 mg/dL   BUN 8 7 - 25 mg/dL   Creat 8.83 3.74 - 4.51 mg/dL   GFR, Est Non African American 101 > OR = 60 mL/min/1.74m2   GFR, Est African American 117 > OR = 60 mL/min/1.95m2   BUN/Creatinine Ratio NOT APPLICABLE 6 - 22 (calc)   Sodium 143 135 - 146 mmol/L   Potassium 4.6 3.5 - 5.3 mmol/L   Chloride 105 98 - 110 mmol/L   CO2 34 (H) 20 - 32 mmol/L   Calcium 9.4 8.6 - 10.4 mg/dL   Total Protein 6.7 6.1 - 8.1 g/dL   Albumin 3.9 3.6 - 5.1 g/dL   Globulin 2.8 1.9 - 3.7 g/dL (calc)   AG Ratio 1.4 1.0 - 2.5 (calc)   Total Bilirubin 0.4 0.2 - 1.2 mg/dL   Alkaline phosphatase (APISO) 70 33 - 130 U/L   AST 11 10 - 35 U/L   ALT 9 6 - 29 U/L  Lipid panel  Result Value Ref Range   Cholesterol 168 <200 mg/dL   HDL 58 >46 mg/dL   Triglycerides 77 <047 mg/dL   LDL Cholesterol (Calc) 93 mg/dL (calc)   Total CHOL/HDL Ratio 2.9 <5.0 (calc)   Non-HDL Cholesterol (Calc) 110 <130 mg/dL (calc)  VITAMIN D 25 Hydroxy (Vit-D Deficiency, Fractures)    Result Value Ref Range   Vit D, 25-Hydroxy 33 30 - 100 ng/mL      Assessment & Plan:   Problem List Items Addressed This Visit    Centrilobular emphysema (HCC)    Stable without exacerbation Improved on Spiriva Continue Albuterol PRN      Chronic pain    Stable chronic problem secondary to MS Followed by [facility] Neurology Checked [place] CSRS for past 2 years, appropriate Tramadol rx Managed on Tramadol, Gabapentin, Tylenol  Add Baclofen PRN nightly - low dose, caution sedation      Relevant Medications   gabapentin (NEURONTIN) 300 MG capsule   baclofen (LIORESAL) 10 MG tablet   Multiple sclerosis, relapsing-remitting (HCC) - Primary    Chronic relapsing remitting MS. Currently stable without flare Complicated with neurogenic bladder, see A&P Followed [facility] Dr Malvin Johns Continue current MS targeted therapy per med list Managed with outpatient PT - improving still, Left lower extremity weakness, now working on upper ext and back as well Pain management on Tramadol, Gabapentin  Add baclofen PRN for back muscle spasms as well nightly only caution sedation, instructions given  Additionally - in reference to recent APS Case I have spoken to DSS in recent weeks - and sent them records as requested, and now evaluating her to determine her functional status - she seems to be able to provide her own ADLs but does greatly benefit from home aide 7 days a week, not 24 hours a day to help with organizing / chores/ transport and other activities - med organization. Clinically she does have some mild cognitive decline in  setting of neurological disorder, she is a high risk patient and due to her medical condition with MS, does require additional help at home, she does not seem to require 24 hour caregiver support, but she often does rely on her healthcare aide for assisting with medication management, health care decisions, appointments, and finances. In my medical opinion, capacity is  difficult to determine for her and may warrant further Neurological evaluation by her Neurology team - she seems to not be comfortable with significant healthcare decisions.      Relevant Medications   gabapentin (NEURONTIN) 300 MG capsule   Neurogenic bladder    Secondary to Multiple Sclerosis, with mixed overflow incontinence Followed by [facility] Neuro Not followed by Urology  Plan Continue Myrbetriq from 50 XL daily - max dose goal to reduce frequency - Indicated for incontinence supplies, pull-ups, continue to use regularly to prevent future UTI risk that would come with soiling. Will continue to treat bladder with Myrbetriq - Future may need Urology - discussed may need urodynamics or possible suprapubic in future if significant concern from MS       Other Visit Diagnoses    Needs flu shot       Relevant Orders   Flu vaccine HIGH DOSE PF (Completed)   Screening for breast cancer       Relevant Orders   MM DIGITAL SCREENING BILATERAL   Muscle spasm of back       Relevant Medications   baclofen (LIORESAL) 10 MG tablet   Colon cancer screening       Relevant Orders   Cologuard      Orders Placed This Encounter  Procedures  . MM DIGITAL SCREENING BILATERAL    Standing Status:   Future    Standing Expiration Date:   10/18/2019    Order Specific Question:   Reason for Exam (SYMPTOM  OR DIAGNOSIS REQUIRED)    Answer:   Routine screening bilateral mammogram. May change to Bilateral MM Tomo screening if indicated and appropriate for patient/insurance    Order Specific Question:   Preferred imaging location?    Answer:   [facility]  . Flu vaccine HIGH DOSE PF  . Cologuard    Meds ordered this encounter  Medications  . baclofen (LIORESAL) 10 MG tablet    Sig: Take 1 tablet (10 mg total) by mouth at bedtime as needed for muscle spasms.    Dispense:  30 each    Refill:  2    Follow up plan: Return in about 6 months (around 02/17/2019) for Annual Physical.  Future labs  ordered for 01/2019  [name], DO [facility] 08/19/2018, 11:12 AM

## 2018-08-19 NOTE — Assessment & Plan Note (Signed)
Stable without exacerbation Improved on Spiriva Continue Albuterol PRN

## 2018-08-19 NOTE — Assessment & Plan Note (Signed)
Stable chronic problem secondary to MS Followed by [facility] Neurology Checked [place] CSRS for past 2 years, appropriate Tramadol rx Managed on Tramadol, Gabapentin, Tylenol  Add Baclofen PRN nightly - low dose, caution sedation

## 2018-08-19 NOTE — Assessment & Plan Note (Signed)
Secondary to Multiple Sclerosis, with mixed overflow incontinence Followed by [facility] Neuro Not followed by Urology  Plan Continue Myrbetriq from 50 XL daily - max dose goal to reduce frequency - Indicated for incontinence supplies, pull-ups, continue to use regularly to prevent future UTI risk that would come with soiling. Will continue to treat bladder with Myrbetriq - Future may need Urology - discussed may need urodynamics or possible suprapubic in future if significant concern from MS

## 2018-08-19 NOTE — Assessment & Plan Note (Addendum)
Chronic relapsing remitting MS. Currently stable without flare Complicated with neurogenic bladder, see A&P Followed by [facility] Dr Malvin Johns Continue current MS targeted therapy per med list Managed with outpatient PT - improving still, Left lower extremity weakness, now working on upper ext and back as well Pain management on Tramadol, Gabapentin  Add baclofen PRN for back muscle spasms as well nightly only caution sedation, instructions given  Additionally - in reference to recent APS Case I have spoken to DSS in recent weeks - and sent them records as requested, and now evaluating her to determine her functional status - she seems to be able to provide her own ADLs but does greatly benefit from home aide 7 days a week, not 24 hours a day to help with organizing / chores/ transport and other activities - med organization. Clinically she does have some mild cognitive decline in setting of neurological disorder, she is a high risk patient and due to her medical condition with MS, does require additional help at home, she does not seem to require 24 hour caregiver support, but she often does rely on her healthcare aide for assisting with medication management, health care decisions, appointments, and finances. In my medical opinion, capacity is difficult to determine for her and may warrant further Neurological evaluation by her Neurology team - she seems to not be comfortable with significant healthcare decisions.

## 2018-08-20 ENCOUNTER — Encounter: Payer: Self-pay | Admitting: Occupational Therapy

## 2018-08-20 ENCOUNTER — Ambulatory Visit: Payer: Medicare Other | Admitting: Occupational Therapy

## 2018-08-20 DIAGNOSIS — M6281 Muscle weakness (generalized): Secondary | ICD-10-CM | POA: Diagnosis not present

## 2018-08-20 DIAGNOSIS — R278 Other lack of coordination: Secondary | ICD-10-CM | POA: Diagnosis not present

## 2018-08-20 NOTE — Therapy (Signed)
[facility] MAIN [facility] SERVICES [address] [place], [place], [address] Phone: [phone]   Fax:  [phone]  Occupational Therapy Treatment  Patient Details  Name: [name] MRN: [id number] Date of Birth: [birth year] No data recorded  Encounter Date: 08/20/2018  OT End of Session - 08/20/18 1320    Visit Number  69    Number of Visits  72    Date for OT Re-Evaluation  09/29/18    Authorization Type  Progress report period staring 06/02/2018    OT Start Time  1315    OT Stop Time  1400    OT Time Calculation (min)  45 min    Activity Tolerance  Treatment limited secondary to medical complications (Comment)    Behavior During Therapy  [facility] for tasks assessed/performed       Past Medical History:  Diagnosis Date  . Headache   . Hyperlipidemia   . MS (multiple sclerosis) (HCC) 1990  . Neurogenic bladder     Past Surgical History:  Procedure Laterality Date  . ABDOMINAL HYSTERECTOMY    . OOPHORECTOMY Bilateral     There were no vitals filed for this visit.  Subjective Assessment - 08/20/18 1318    Subjective   Pt. continues to have back pain.    Pertinent History  Patient reports she was diagnosed with MS years ago and has recently started to feel weak in her left dominant arm and hand.  She denies any previous therapy and reports memory issues and can provide a limited history.     Patient Stated Goals  Patient reports she wants to be able to walk better, use her left hand and be able to write again.       OT TREATMENT    Neuro muscular re-education:  Pt. worked on grasping, flipping and stacking 2" large pegs on the Instructo board placed at a tabletop surface. Pt. worked on using her left hand to shift the 4 primary colors to fill in rows. Pt. had difficulty organizing the instructo board in to rows by color. Pt. required extensive cues to organize the [employer]. Pt. performed [facility] tasks using the grooved pegboard. Pt.  worked on grasping the grooved pegs from a horizontal position, and moving the pegs to a vertical position in the hand to prepare for placing them in the grooved slot. Pt. Worked on using her right hand to assist in turning the pegs in her left hand to fit in the                          OT Education - 08/20/18 1318    Education provided  Yes    Education Details  Left hand [facility] skills, reaching skills    Person(s) Educated  Patient    Methods  Explanation    Comprehension  Need further instruction          OT Arca Term Goals - 07/07/18 1411      OT Curt TERM GOAL #1   Title  Patient will demonstrate holding utensils in her left hand and feeding herself with modified independence with minimal spillage.      Baseline  04/09/2018: Pt spills >50% of food when using L hand. Pt is able to cut soft meats while using her fork.    Time  8    Period  Weeks    Status  Achieved      OT Bolz  TERM GOAL #2   Title  Patient will complete tub/shower transfer with supervision only.    Baseline  04/09/2018 Pt requires min A to lift legs over tub for transfers    Time  8    Period  Weeks    Status  Deferred      OT Schiavi TERM GOAL #3   Title  Pt. will independently opens packets, and bags indpendently    Baseline  MinA    Time  12    Period  Weeks    Status  New    Target Date  09/29/18      OT [place] TERM GOAL #4   Title  Patient will increase left UE strength by 1 mm grade to be able to reach into closet to retrieve clothing items.     Baseline  07/07/2018: Pt LUE strength 4/5. Pt can reach items in her closet independently    Time  12    Period  Weeks    Status  On-going    Target Date  09/29/18      OT Hull TERM GOAL #5   Title  Patient will improve coordination on the left to be able to hold pen securely and write her name with 75% legibility.     Baseline  07/07/2018: Pt prints name with 60% legibility, cursive words with 25% legibility using standard pen with a  grip    Time  12    Period  Weeks    Status  On-going    Target Date  09/29/18      OT [place] TERM GOAL #6   Title  Patient will complete lower body dressing with modified independence using adaptive equipment if needed.     Baseline  04/09/2018 - Pt requires min A to don pants. Pt requires increased time to tie shoes independently. 05/28/18 modified independent    Time  12    Period  Weeks    Status  Achieved      OT Wy[place] TERM GOAL #7   Title  Pt. will independently put her earrings in.    Baseline  07/07/2018: Pt. is unable, and has difficulty putting her earrings in.    Time  12    Period  Weeks    Status  New    Target Date  09/29/18      OT Wolfley TERM GOAL #8   Title  Pt. will be indpendent with using her left hand to apply her lipstick.    Baseline  07/07/2018: Pt. is unable  to complete    Time  12    Period  Weeks    Status  New    Target Date  09/29/18      OT Shea TERM GOAL  #9   Baseline  Pt. will independently open containers, and jars.    Time  12    Period  Weeks    Status  New    Target Date  09/29/18            Plan - 08/20/18 1321    Clinical Impression Statement  Pt. reports having had a follow appointment for her back, and reports that she was given a muscle relaxer to take when she sleeps. Pt. Continues to present with limited motor control, and [facility] skills in the left UE. Pt. continues to work on improving LUE strength, and [facility] skills in order to be able to improve independence, and motor control for applying lipstick, write efficiently,  improve safety, and maximize independence.    Occupational Profile and client history currently impacting functional performance  patient is legally blind, progressive disease process with MS for over 20+ years, lives alone, limited availability for transportation.      Occupational performance deficits (Please refer to evaluation for details):  ADL's;Leisure    Rehab Potential  Poor    OT Frequency  2x / week    OT  Duration  12 weeks    OT Treatment/Interventions  Therapeutic exercise;Cognitive remediation/compensation;Functional Mobility Training    Plan  Positive:  motivation, family support.  Negative:  lives alone, legally blind, requires caregiver 7 days a week for a few hours a day for self care    Clinical Decision Making  Several treatment options, min-mod task modification necessary    Consulted and Agree with Plan of Care  Patient       Patient will benefit from skilled therapeutic intervention in order to improve the following deficits and impairments:  Decreased cognition, Decreased knowledge of use of DME, Impaired vision/preception, Pain, Decreased coordination, Decreased activity tolerance, Decreased endurance, Decreased strength, Decreased balance, Impaired UE functional use  Visit Diagnosis: Muscle weakness (generalized)    Problem List Patient Active Problem List   Diagnosis Date Noted  . Lower extremity edema 04/15/2017  . Macrocytic anemia 02/11/2017  . Vitamin D deficiency 02/02/2016  . Mixed hyperlipidemia 01/03/2015  . Multiple sclerosis, relapsing-remitting (HCC) 01/03/2015  . Chronic pain 01/03/2015  . GERD without esophagitis 01/03/2015  . Centrilobular emphysema (HCC) 01/03/2015  . Osteopenia 01/03/2015  . Neurogenic bladder 01/03/2015  . Incontinence in female 01/03/2015    Olegario Messier, MS, OTR/L 08/20/2018, 1:59 PM  [facility] MAIN [facility] SERVICES [address] [place], [place], [address] Phone: [phone]   Fax:  [phone]  Name: [name] MRN: [id number] Date of Birth: 22-Jan-1956

## 2018-08-24 ENCOUNTER — Ambulatory Visit: Payer: Medicare Other | Attending: Neurology | Admitting: Occupational Therapy

## 2018-08-24 ENCOUNTER — Encounter: Payer: Self-pay | Admitting: Occupational Therapy

## 2018-08-24 DIAGNOSIS — M6281 Muscle weakness (generalized): Secondary | ICD-10-CM

## 2018-08-24 DIAGNOSIS — R278 Other lack of coordination: Secondary | ICD-10-CM | POA: Diagnosis not present

## 2018-08-24 NOTE — Therapy (Addendum)
[facility] MAIN [facility] SERVICES [address] [place], [place], [address] Phone: [phone]   Fax:  [phone] Occupational Therapy Progress Note  Dates of reporting period  12/102019  to   08/24/2018  Patient Details  Name: [name] MRN: [id number] Date of Birth: [birth year] No data recorded  Encounter Date: 08/24/2018  OT End of Session - 08/24/18 1433    Visit Number  70    Number of Visits  96    Date for OT Re-Evaluation  09/29/18    Authorization Type  Progress report period staring 08/24/2018   OT Start Time  1430    OT Stop Time  1515    OT Time Calculation (min)  45 min    Activity Tolerance  Treatment limited secondary to medical complications (Comment)    Behavior During Therapy  [facility] for tasks assessed/performed       Past Medical History:  Diagnosis Date  . Headache   . Hyperlipidemia   . MS (multiple sclerosis) (HCC) 1990  . Neurogenic bladder     Past Surgical History:  Procedure Laterality Date  . ABDOMINAL HYSTERECTOMY    . OOPHORECTOMY Bilateral     There were no vitals filed for this visit.  Subjective Assessment - 08/24/18 1430    Subjective   Pt. reports that she feels like the muscle relaxer is helping her back.    Pertinent History  Patient reports she was diagnosed with MS years ago and has recently started to feel weak in her left dominant arm and hand.  She denies any previous therapy and reports memory issues and can provide a limited history.     Patient Stated Goals  Patient reports she wants to be able to walk better, use her left hand and be able to write again.     Currently in Pain?  Yes    Pain Score  4     Pain Location  Back    Pain Orientation  Left    Pain Descriptors / Indicators  Aching    Pain Type  Chronic pain    Pain Onset  More than a month ago         [facility] OT Assessment - 08/24/18 1453      Coordination   Right 9 Hole Peg Test  34    Left 9 Hole Peg Test  51       Strength   Overall Strength Comments  RUE 5/5, LUE 4/5      Hand Function   Right Hand Grip (lbs)  50    Right Hand Lateral Pinch  16 lbs    Right Hand 3 Point Pinch  13 lbs    Left Hand Grip (lbs)  30    Left Hand Lateral Pinch  19 lbs    Left 3 point pinch  13 lbs      OT TREATMENT    Measurements were obtained, and goals were reviewed with the pt.  Neuro muscular re-education:  Pt. worked on grasping and manipulating pegs with her left hand using the [employer]. Pt. worked on Geophysicist/field seismologist using the [employer] pattern system. Pt. required extensive step-by-step cues to follow the design.                       OT Education - 08/24/18 1432    Education provided  Yes    Education Details  Left hand [facility] skills, reaching  skills    Person(s) Educated  Patient    Methods  Explanation    Comprehension  Need further instruction          OT Legan Term Goals - 08/24/18 1510      OT Maertens TERM GOAL #3   Title  Pt. will independently opens packets, and bags indpendently    Baseline  MinA    Time  12    Period  Weeks    Status  On-going    Target Date  09/29/18      OT Garretson TERM GOAL #4   Title  Patient will increase left UE strength by 1 mm grade to be able to reach into closet to retrieve clothing items.     Baseline  08/24/2018: Pt LUE strength 4/5. Pt can reach items in her closet independently    Time  12    Period  Weeks    Status  On-going    Target Date  09/29/18      OT Verrette TERM GOAL #5   Title  Patient will improve coordination on the left to be able to hold pen securely and write her name with 75% legibility.     Baseline  08/24/2018: Pt prints name with 60% legibility, cursive words with 25% legibility using standard pen with a grip    Time  12    Period  Weeks    Status  On-going    Target Date  09/29/18      OT Olejniczak TERM GOAL #7   Title  Pt. will independently put her earrings in.    Baseline  08/24/2018: Pt. conitnues to  have difficulty donning earrings.    Time  12    Period  Weeks    Status  New    Target Date  09/29/18      OT Kloehn TERM GOAL #8   Title  Pt. will be indpendent with using her left hand to apply her lipstick.    Baseline  08/24/2018: Pt. is progressing, however has difficulty at times.    Time  12    Period  Weeks    Status  New    Target Date  09/29/18      OT Rineer TERM GOAL  #9   Baseline  Pt. will independently open containers, and jars.    Time  12    Period  Weeks    Status  New    Target Date  09/29/18            Plan - 08/24/18 1435    Clinical Impression Statement Pt. is making steady progress overall with left grip strength, hand function, motor control, and [facility] skills however continues to present with limited left grip strength, motor control, and [facility]. Pt. continues to work on improving Left UE strength, [facility] skills in order to be able to apply lipstick, donning earrings, and write legibly. .      Occupational Profile and client history currently impacting functional performance  patient is legally blind, progressive disease process with MS for over 20+ years, lives alone, limited availability for transportation.      Occupational performance deficits (Please refer to evaluation for details):  ADL's;Leisure    Rehab Potential  Poor    Clinical Decision Making  Several treatment options, min-mod task modification necessary    OT Frequency  2x / week    OT Duration  12 weeks    OT Treatment/Interventions  Therapeutic exercise;Cognitive remediation/compensation;Functional  Mobility Training    Plan  Positive:  motivation, family support.  Negative:  lives alone, legally blind, requires caregiver 7 days a week for a few hours a day for self care    Consulted and Agree with Plan of Care  Patient       Patient will benefit from skilled therapeutic intervention in order to improve the following deficits and impairments:     Visit Diagnosis: Muscle weakness  (generalized)  Other lack of coordination    Problem List Patient Active Problem List   Diagnosis Date Noted  . Lower extremity edema 04/15/2017  . Macrocytic anemia 02/11/2017  . Vitamin D deficiency 02/02/2016  . Mixed hyperlipidemia 01/03/2015  . Multiple sclerosis, relapsing-remitting (HCC) 01/03/2015  . Chronic pain 01/03/2015  . GERD without esophagitis 01/03/2015  . Centrilobular emphysema (HCC) 01/03/2015  . Osteopenia 01/03/2015  . Neurogenic bladder 01/03/2015  . Incontinence in female 01/03/2015    Olegario Messier, MS, OTR/L 08/24/2018, 4:51 PM  [place] [facility] MAIN [facility] SERVICES [address] [place], [place], [address] Phone: [phone]   Fax:  [phone]  Name: ASYA ENGELKES MRN: [id number] Date of Birth: 13-Oct-1955

## 2018-08-26 ENCOUNTER — Encounter: Payer: Self-pay | Admitting: Occupational Therapy

## 2018-08-26 ENCOUNTER — Ambulatory Visit: Payer: Medicare Other | Admitting: Occupational Therapy

## 2018-08-26 DIAGNOSIS — R278 Other lack of coordination: Secondary | ICD-10-CM | POA: Diagnosis not present

## 2018-08-26 DIAGNOSIS — M6281 Muscle weakness (generalized): Secondary | ICD-10-CM | POA: Diagnosis not present

## 2018-08-26 NOTE — Therapy (Signed)
[facility] MAIN [facility] SERVICES [address] [place], [place], [address] Phone: [phone]   Fax:  [phone]  Occupational Therapy Treatment  Patient Details  Name: [name] MRN: [id number] Date of Birth: Nov 09, 1955 No data recorded  Encounter Date: 08/26/2018  OT End of Session - 08/26/18 1434    Visit Number  71    Number of Visits  96    Date for OT Re-Evaluation  09/29/18    Authorization Type  Progress report period staring 06/02/2018    OT Start Time  1425    OT Stop Time  1510    OT Time Calculation (min)  45 min    Equipment Utilized During Treatment  magnifying glass    Activity Tolerance  Treatment limited secondary to medical complications (Comment)    Behavior During Therapy  [facility] for tasks assessed/performed       Past Medical History:  Diagnosis Date  . Headache   . Hyperlipidemia   . MS (multiple sclerosis) (HCC) 1990  . Neurogenic bladder     Past Surgical History:  Procedure Laterality Date  . ABDOMINAL HYSTERECTOMY    . OOPHORECTOMY Bilateral     There were no vitals filed for this visit.  Subjective Assessment - 08/26/18 1431    Subjective   Pt. reports no falls, or near falls.    Pertinent History  Patient reports she was diagnosed with MS years ago and has recently started to feel weak in her left dominant arm and hand.  She denies any previous therapy and reports memory issues and can provide a limited history.     Patient Stated Goals  Patient reports she wants to be able to walk better, use her left hand and be able to write again.     Currently in Pain?  Yes    Pain Score  4     Pain Location  Back    Pain Orientation  Left    Pain Descriptors / Indicators  Aching    Pain Type  Chronic pain      OT TREATMENT    Neuro muscular re-education:  Pt. worked on grasping and manipulating pegs with her left hand using the [employer]. Pt. worked on Geophysicist/field seismologist using the [employer]  pattern system. Pt. required extensive step-by-step visual, and verbal cues to follow the design pattern.                          OT Education - 08/26/18 1434    Education provided  Yes    Education Details  Left hand [facility] skills, reaching skills    Person(s) Educated  Patient    Methods  Explanation    Comprehension  Need further instruction          OT Burklow Term Goals - 08/24/18 1510      OT Buckwalter TERM GOAL #3   Title  Pt. will independently opens packets, and bags indpendently    Baseline  MinA    Time  12    Period  Weeks    Status  On-going    Target Date  09/29/18      OT Bachmann TERM GOAL #4   Title  Patient will increase left UE strength by 1 mm grade to be able to reach into closet to retrieve clothing items.     Baseline  08/24/2018: Pt LUE strength 4/5. Pt can reach items in  her closet independently    Time  12    Period  Weeks    Status  On-going    Target Date  09/29/18      OT Wachter TERM GOAL #5   Title  Patient will improve coordination on the left to be able to hold pen securely and write her name with 75% legibility.     Baseline  08/24/2018: Pt prints name with 60% legibility, cursive words with 25% legibility using standard pen with a grip    Time  12    Period  Weeks    Status  On-going    Target Date  09/29/18      OT Landgren TERM GOAL #7   Title  Pt. will independently put her earrings in.    Baseline  08/24/2018: Pt. conitnues to have difficulty donning earrings.    Time  12    Period  Weeks    Status  New    Target Date  09/29/18      OT Gaumond TERM GOAL #8   Title  Pt. will be indpendent with using her left hand to apply her lipstick.    Baseline  08/24/2018: Pt. is progressing, however has difficulty at times.    Time  12    Period  Weeks    Status  New    Target Date  09/29/18      OT Moctezuma TERM GOAL  #9   Baseline  Pt. will independently open containers, and jars.    Time  12    Period  Weeks    Status  New    Target  Date  09/29/18            Plan - 08/26/18 1438    Clinical Impression Statement  Pt. continues to work on improving left UE functioning. Pt. continues to work on improving left hand function, motor control, and [facility] skills in order to improve independence with applying lipstick, donning earrings, and writing legibly.    Occupational Profile and client history currently impacting functional performance  patient is legally blind, progressive disease process with MS for over 20+ years, lives alone, limited availability for transportation.      Occupational performance deficits (Please refer to evaluation for details):  ADL's;Leisure    OT Frequency  2x / week    OT Duration  12 weeks    OT Treatment/Interventions  Therapeutic exercise;Cognitive remediation/compensation;Functional Mobility Training    Plan  Positive:  motivation, family support.  Negative:  lives alone, legally blind, requires caregiver 7 days a week for a few hours a day for self care    Consulted and Agree with Plan of Care  Patient       Patient will benefit from skilled therapeutic intervention in order to improve the following deficits and impairments:     Visit Diagnosis: Muscle weakness (generalized)    Problem List Patient Active Problem List   Diagnosis Date Noted  . Lower extremity edema 04/15/2017  . Macrocytic anemia 02/11/2017  . Vitamin D deficiency 02/02/2016  . Mixed hyperlipidemia 01/03/2015  . Multiple sclerosis, relapsing-remitting (HCC) 01/03/2015  . Chronic pain 01/03/2015  . GERD without esophagitis 01/03/2015  . Centrilobular emphysema (HCC) 01/03/2015  . Osteopenia 01/03/2015  . Neurogenic bladder 01/03/2015  . Incontinence in female 01/03/2015    Olegario Messier, MS, OTR/L 08/26/2018, 3:05 PM  [place] [facility] MAIN [facility] SERVICES [address] [place], [place], [address] Phone: [phone]  Fax:  [phone]  Name: KATHERENE SARTINI MRN:  [id number] Date of Birth: Feb 21, 1956

## 2018-09-02 ENCOUNTER — Ambulatory Visit: Payer: Medicare Other | Admitting: Occupational Therapy

## 2018-09-02 ENCOUNTER — Encounter: Payer: Self-pay | Admitting: Occupational Therapy

## 2018-09-02 ENCOUNTER — Other Ambulatory Visit: Payer: Self-pay

## 2018-09-02 DIAGNOSIS — R278 Other lack of coordination: Secondary | ICD-10-CM | POA: Diagnosis not present

## 2018-09-02 DIAGNOSIS — M6281 Muscle weakness (generalized): Secondary | ICD-10-CM | POA: Diagnosis not present

## 2018-09-02 NOTE — Therapy (Signed)
[place] [facility] MAIN [facility] SERVICES [address] [place], [place], [address] Phone: [phone]   Fax:  [phone]  Occupational Therapy Treatment  Patient Details  Name: [name] MRN: [id number] Date of Birth: 05/19/1956 No data recorded  Encounter Date: 09/02/2018  OT End of Session - 09/02/18 1157    Visit Number  72    Number of Visits  96    Date for OT Re-Evaluation  09/29/18    Authorization Type  Progress report period staring 06/02/2018    OT Start Time  1149    OT Stop Time  1230    OT Time Calculation (min)  41 min    Equipment Utilized During Treatment  magnifying glass    Activity Tolerance  Treatment limited secondary to medical complications (Comment)    Behavior During Therapy  [facility] for tasks assessed/performed       Past Medical History:  Diagnosis Date  . Headache   . Hyperlipidemia   . MS (multiple sclerosis) (HCC) 1990  . Neurogenic bladder     Past Surgical History:  Procedure Laterality Date  . ABDOMINAL HYSTERECTOMY    . OOPHORECTOMY Bilateral     There were no vitals filed for this visit.  Subjective Assessment - 09/02/18 1156    Subjective   Pt. reports doing well overall.    Pertinent History  Patient reports she was diagnosed with MS years ago and has recently started to feel weak in her left dominant arm and hand.  She denies any previous therapy and reports memory issues and can provide a limited history.     Patient Stated Goals  Patient reports she wants to be able to walk better, use her left hand and be able to write again.     Currently in Pain?  Yes    Pain Score  4     Pain Location  Back    Pain Orientation  Left    Pain Descriptors / Indicators  Aching      OT TREATMENT    Neuro muscular re-education:  Pt. worked on grasping and manipulating pegs with her left hand using the [employer]. Pt. worked on Geophysicist/field seismologist using the [employer] pattern system. Pt. required  extensive step-by-step visual, and verbal cues to follow the design pattern. Pt. continues to have difficulty with design patterns as the task progresses, requiring fewer cues initially today.                            OT Education - 09/02/18 1157    Education provided  Yes    Education Details  Left hand [facility] skills, reaching skills    Person(s) Educated  Patient    Methods  Explanation    Comprehension  Need further instruction          OT Fryman Term Goals - 08/24/18 1510      OT Menon TERM GOAL #3   Title  Pt. will independently opens packets, and bags indpendently    Baseline  MinA    Time  12    Period  Weeks    Status  On-going    Target Date  09/29/18      OT Stlouis TERM GOAL #4   Title  Patient will increase left UE strength by 1 mm grade to be able to reach into closet to retrieve clothing items.     Baseline  08/24/2018:  Pt LUE strength 4/5. Pt can reach items in her closet independently    Time  12    Period  Weeks    Status  On-going    Target Date  09/29/18      OT Creson TERM GOAL #5   Title  Patient will improve coordination on the left to be able to hold pen securely and write her name with 75% legibility.     Baseline  08/24/2018: Pt prints name with 60% legibility, cursive words with 25% legibility using standard pen with a grip    Time  12    Period  Weeks    Status  On-going    Target Date  09/29/18      OT Venuti TERM GOAL #7   Title  Pt. will independently put her earrings in.    Baseline  08/24/2018: Pt. conitnues to have difficulty donning earrings.    Time  12    Period  Weeks    Status  New    Target Date  09/29/18      OT Creque TERM GOAL #8   Title  Pt. will be indpendent with using her left hand to apply her lipstick.    Baseline  08/24/2018: Pt. is progressing, however has difficulty at times.    Time  12    Period  Weeks    Status  New    Target Date  09/29/18      OT Chatterjee TERM GOAL  #9   Baseline  Pt. will  independently open containers, and jars.    Time  12    Period  Weeks    Status  New    Target Date  09/29/18            Plan - 09/02/18 1159    Clinical Impression Statement  Pt. reports being concerned about the onset of Coronavirus in [place] now. Pt. reports trying to prepare for it, and has hand sanitizer at home, and is carrying it in her purse. Pt. presents with impaired motor control, and [facility] skills. Pt. continues to work on improving UE strength,a nd [facility] skills. Pt. continues to work on improving ADL, and IADL functioning.     Occupational Profile and client history currently impacting functional performance  patient is legally blind, progressive disease process with MS for over 20+ years, lives alone, limited availability for transportation.      Occupational performance deficits (Please refer to evaluation for details):  ADL's;Leisure    Rehab Potential  Poor    Clinical Decision Making  Several treatment options, min-mod task modification necessary    OT Frequency  2x / week    OT Duration  12 weeks    OT Treatment/Interventions  Therapeutic exercise;Cognitive remediation/compensation;Functional Mobility Training    Plan  Positive:  motivation, family support.  Negative:  lives alone, legally blind, requires caregiver 7 days a week for a few hours a day for self care    Consulted and Agree with Plan of Care  Patient       Patient will benefit from skilled therapeutic intervention in order to improve the following deficits and impairments:     Visit Diagnosis: Muscle weakness (generalized)  Other lack of coordination    Problem List Patient Active Problem List   Diagnosis Date Noted  . Lower extremity edema 04/15/2017  . Macrocytic anemia 02/11/2017  . Vitamin D deficiency 02/02/2016  . Mixed hyperlipidemia 01/03/2015  . Multiple sclerosis, relapsing-remitting (HCC) 01/03/2015  .  Chronic pain 01/03/2015  . GERD without esophagitis 01/03/2015  .  Centrilobular emphysema (HCC) 01/03/2015  . Osteopenia 01/03/2015  . Neurogenic bladder 01/03/2015  . Incontinence in female 01/03/2015    Olegario Messier, MS, OTR/L 09/02/2018, 12:14 PM  [place] [facility] MAIN [facility] SERVICES [address] [place], [place], [address] Phone: [phone]   Fax:  [phone]  Name: [name] MRN: [id number] Date of Birth: Apr 03, 1956

## 2018-09-04 ENCOUNTER — Other Ambulatory Visit: Payer: Self-pay

## 2018-09-04 ENCOUNTER — Ambulatory Visit: Payer: Medicare Other | Admitting: Occupational Therapy

## 2018-09-04 ENCOUNTER — Encounter: Payer: Self-pay | Admitting: Occupational Therapy

## 2018-09-04 DIAGNOSIS — R278 Other lack of coordination: Secondary | ICD-10-CM

## 2018-09-04 DIAGNOSIS — M6281 Muscle weakness (generalized): Secondary | ICD-10-CM

## 2018-09-04 NOTE — Therapy (Signed)
[facility] MAIN [facility] SERVICES [address] [place], [place], [address] Phone: [phone]   Fax:  [phone]  Occupational Therapy Treatment  Patient Details  Name: [name] MRN: [id number] Date of Birth: 1955-08-27 No data recorded  Encounter Date: 09/04/2018  OT End of Session - 09/04/18 1115    Visit Number  73    Number of Visits  96    Date for OT Re-Evaluation  09/29/18    OT Start Time  1108    OT Stop Time  1155    OT Time Calculation (min)  47 min    Activity Tolerance  Treatment limited secondary to medical complications (Comment)    Behavior During Therapy  [facility] for tasks assessed/performed       Past Medical History:  Diagnosis Date  . Headache   . Hyperlipidemia   . MS (multiple sclerosis) (HCC) 1990  . Neurogenic bladder     Past Surgical History:  Procedure Laterality Date  . ABDOMINAL HYSTERECTOMY    . OOPHORECTOMY Bilateral     There were no vitals filed for this visit.  Subjective Assessment - 09/04/18 1114    Subjective   Patient denies any new pain, her regular pain is in the left side UE, LE 3/10    Pertinent History  Patient reports she was diagnosed with MS years ago and has recently started to feel weak in her left dominant arm and hand.  She denies any previous therapy and reports memory issues and can provide a limited history.     Patient Stated Goals  Patient reports she wants to be able to walk better, use her left hand and be able to write again.     Currently in Pain?  No/denies    Pain Score  0-No pain         Neuromuscular Re-education:  Patient seen for left UE manipulation skills with use of round ball pegs to pick out from specific color from container, difficulty determining colors from her impaired vision Difficulty with detecting blue versus green.  She was able to tell they were a shade different when colors were side by side.  Dropping items frequently and tremors in left  interfere with reaching and coordination, When removing, moves 2 to palm and one in fingers prior to placing into the container.    Manipulation of small less than 1/2 inch snap beads using right hand to stabilize and work on left hand to push and place.  Difficulty at times with finger strength on left to complete task, cues for prehension patterns.   Handwriting with left dominant hand and small built up pen.  Legibility is fair to poor today, letters larger in size and working on printing only. Cues at times for tripod grasp onto pen and for formation of letters.       Response to treatment:   Patient continues to progress in all areas, tremors in left hand are worse at times when attempting to pick up small objects and during handwriting.  She did show improvements in being able to manipulate small snap beads and place together, some difficulty noted but able to complete most of the attempts.  Continue to work towards LUE strength, ROM, and coordination skills to increase independence in daily tasks so that patient can continue to live alone.               OT Education - 09/04/18 1115    Education provided  Yes    Education Details  left hand manipulation skills    Person(s) Educated  Patient    Methods  Explanation    Comprehension  Tactile cues required          OT Hosang Term Goals - 08/24/18 1510      OT Jon TERM GOAL #3   Title  Pt. will independently opens packets, and bags indpendently    Baseline  MinA    Time  12    Period  Weeks    Status  On-going    Target Date  09/29/18      OT Meline TERM GOAL #4   Title  Patient will increase left UE strength by 1 mm grade to be able to reach into closet to retrieve clothing items.     Baseline  08/24/2018: Pt LUE strength 4/5. Pt can reach items in her closet independently    Time  12    Period  Weeks    Status  On-going    Target Date  09/29/18      OT Ratliff TERM GOAL #5   Title  Patient will improve  coordination on the left to be able to hold pen securely and write her name with 75% legibility.     Baseline  08/24/2018: Pt prints name with 60% legibility, cursive words with 25% legibility using standard pen with a grip    Time  12    Period  Weeks    Status  On-going    Target Date  09/29/18      OT Degraffenreid TERM GOAL #7   Title  Pt. will independently put her earrings in.    Baseline  08/24/2018: Pt. conitnues to have difficulty donning earrings.    Time  12    Period  Weeks    Status  New    Target Date  09/29/18      OT Opie TERM GOAL #8   Title  Pt. will be indpendent with using her left hand to apply her lipstick.    Baseline  08/24/2018: Pt. is progressing, however has difficulty at times.    Time  12    Period  Weeks    Status  New    Target Date  09/29/18      OT [place]oley TERM GOAL  #9   Baseline  Pt. will independently open containers, and jars.    Time  12    Period  Weeks    Status  New    Target Date  09/29/18            Plan - 09/04/18 1115    Clinical Impression Statement  Patient continues to progress in all areas, tremors in left hand are worse at times when attempting to pick up small objects and during handwriting.  She did show improvements in being able to manipulate small snap beads and place together, some difficulty noted but able to complete most of the attempts.  Continue to work towards LUE strength, ROM, and coordination skills to increase independence in daily tasks so that patient can continue to live alone.     Occupational Profile and client history currently impacting functional performance  patient is legally blind, progressive disease process with MS for over 20+ years, lives alone, limited availability for transportation.      Occupational performance deficits (Please refer to evaluation for details):  ADL's;Leisure    Rehab Potential  Fair    OT Frequency  2x /  week    OT Duration  12 weeks    OT Treatment/Interventions  Therapeutic  exercise;Cognitive remediation/compensation;Functional Mobility Training    Plan  Positive:  motivation, family support.  Negative:  lives alone, legally blind, requires caregiver 7 days a week for a few hours a day for self care    Consulted and Agree with Plan of Care  Patient       Patient will benefit from skilled therapeutic intervention in order to improve the following deficits and impairments:     Visit Diagnosis: Muscle weakness (generalized)  Other lack of coordination    Problem List Patient Active Problem List   Diagnosis Date Noted  . Lower extremity edema 04/15/2017  . Macrocytic anemia 02/11/2017  . Vitamin D deficiency 02/02/2016  . Mixed hyperlipidemia 01/03/2015  . Multiple sclerosis, relapsing-remitting (HCC) 01/03/2015  . Chronic pain 01/03/2015  . GERD without esophagitis 01/03/2015  . Centrilobular emphysema (HCC) 01/03/2015  . Osteopenia 01/03/2015  . Neurogenic bladder 01/03/2015  . Incontinence in female 01/03/2015    T Arne Cleveland, OTR/L, CLT  , 09/04/2018, 12:36 PM  [place] [facility] MAIN [facility] SERVICES [address] [place], [place], [address] Phone: [phone]   Fax:  [phone]  Name: LEXXIE STOECKER MRN: [id number] Date of Birth: 02-Apr-1956

## 2018-09-08 ENCOUNTER — Ambulatory Visit: Payer: Medicare Other | Admitting: Occupational Therapy

## 2018-09-11 ENCOUNTER — Encounter: Payer: Medicare Other | Admitting: Occupational Therapy

## 2018-09-14 ENCOUNTER — Ambulatory Visit: Payer: Medicare Other | Admitting: Occupational Therapy

## 2018-09-14 ENCOUNTER — Encounter: Payer: Self-pay | Admitting: Occupational Therapy

## 2018-09-14 NOTE — Therapy (Signed)
[place] [facility] MAIN [facility] SERVICES [address] [place], [place], [address] Phone: [phone]   Fax:  [phone]  Patient Details  Name: [name] MRN: [id number] Date of Birth: 18-Sep-1955 Referring Provider:  No ref. provider found  Encounter Date: 09/14/2018  Reached out to the pt. via the telephone to check in on the pt., inform them that the clinic is closed due to the pandemic, and answer questions about HEPs.  Olegario Messier, MS, OTR/L 09/14/2018, 2:03 PM  [place] [facility] MAIN [facility] SERVICES [address] [place], [place], [address] Phone: [phone]   Fax:  [phone]

## 2018-09-16 ENCOUNTER — Encounter: Payer: Medicare Other | Admitting: Occupational Therapy

## 2018-09-21 ENCOUNTER — Encounter: Payer: Medicare Other | Admitting: Occupational Therapy

## 2018-09-23 ENCOUNTER — Encounter: Payer: Medicare Other | Admitting: Occupational Therapy

## 2018-09-29 ENCOUNTER — Encounter: Payer: Medicare Other | Admitting: Occupational Therapy

## 2018-10-02 ENCOUNTER — Encounter: Payer: Self-pay | Admitting: Occupational Therapy

## 2018-10-02 NOTE — Therapy (Signed)
[place] [facility] MAIN [facility] SERVICES [address] [place], [place], [address] Phone: [phone]   Fax:  [phone]  Patient Details  Name: [name] MRN: [id number] Date of Birth: 10-05-1955 Referring Provider:  No ref. provider found  Encounter Date: 10/02/2018  Dr. Althea Charon,  [name] was referred for outpatien Occupational Therapy services, and has consistentently been attending.  Due to COVID-19, it will be more appropriate at this time to refer for home health therapy services until our clinics outpatient clinics reopen.  Thank you,  Olegario Messier, MS, OTR/L    Olegario Messier 10/02/2018, 2:47 PM  [place] [facility] MAIN [facility] SERVICES [address] [place], [place], [address] Phone: [phone]   Fax:  [phone]

## 2018-10-02 NOTE — Therapy (Signed)
[place] [facility] MAIN [facility] SERVICES [address] [place], [place], [address] Phone: [phone]   Fax:  [phone]  Patient Details  Name: [name] MRN: [id number] Date of Birth: [birth year] Referring Provider:  No ref. provider found  Encounter Date: 10/02/2018   The [facility] outpatient clinics are closed at this time due to the COVID-19 epidemic. Upon consideration of patient's status and needs, the referring physician is being contacted to request a home health referral in order to help provide continued rehabilitation services care for the patient.   Olegario Messier, MS, OTR/L 10/02/2018, 2:46 PM  [place] [facility] MAIN [facility] SERVICES [address] [place], [place], [address] Phone: [phone]   Fax:  [phone]

## 2018-10-05 ENCOUNTER — Encounter: Payer: Medicare Other | Admitting: Occupational Therapy

## 2018-10-07 ENCOUNTER — Encounter: Payer: Medicare Other | Admitting: Occupational Therapy

## 2018-10-12 ENCOUNTER — Encounter: Payer: Medicare Other | Admitting: Occupational Therapy

## 2018-10-14 ENCOUNTER — Encounter: Payer: Medicare Other | Admitting: Occupational Therapy

## 2018-10-19 ENCOUNTER — Encounter: Payer: Medicare Other | Admitting: Occupational Therapy

## 2018-10-21 ENCOUNTER — Encounter: Payer: Medicare Other | Admitting: Occupational Therapy

## 2018-10-31 ENCOUNTER — Other Ambulatory Visit: Payer: Self-pay | Admitting: Family Medicine

## 2018-10-31 DIAGNOSIS — J432 Centrilobular emphysema: Secondary | ICD-10-CM

## 2018-11-12 ENCOUNTER — Ambulatory Visit: Payer: Self-pay | Admitting: Family Medicine

## 2018-11-12 NOTE — Chronic Care Management (AMB) (Signed)
Chronic Care Management   Note  11/12/2018 Name: GLENORA MOROCHO MRN: [id number] DOB: 22-Jan-1956  Alisia Ferrari is a 63 y.o. year old female who is a primary care patient of [name], DO. I reached out to [employer] by phone today in response to a referral sent by Ms. [place] health plan.    Ms. Civello was given information about Chronic Care Management services today including:  1. CCM service includes personalized support from designated clinical staff supervised by her physician, including individualized plan of care and coordination with other care providers 2. 24/7 contact phone numbers for assistance for urgent and routine care needs. 3. Service will only be billed when office clinical staff spend 20 minutes or more in a month to coordinate care. 4. Only one practitioner may furnish and bill the service in a calendar month. 5. The patient may stop CCM services at any time (effective at the end of the month) by phone call to the office staff. 6. The patient will be responsible for cost sharing (co-pay) of up to 20% of the service fee (after annual deductible is met).  Patient agreed to services and verbal consent obtained.   Follow up plan: Telephone appointment with CCM team member scheduled for: 12/07/2018  [place]  ??[email or web address]   ??[phone]

## 2018-11-18 DIAGNOSIS — H4720 Unspecified optic atrophy: Secondary | ICD-10-CM | POA: Diagnosis not present

## 2018-11-28 ENCOUNTER — Other Ambulatory Visit: Payer: Self-pay | Admitting: Family Medicine

## 2018-11-28 DIAGNOSIS — M62830 Muscle spasm of back: Secondary | ICD-10-CM

## 2018-12-07 ENCOUNTER — Telehealth: Payer: Self-pay | Admitting: Family Medicine

## 2018-12-07 ENCOUNTER — Ambulatory Visit: Payer: Medicare Other | Admitting: *Deleted

## 2018-12-07 DIAGNOSIS — G35 Multiple sclerosis: Secondary | ICD-10-CM

## 2018-12-07 DIAGNOSIS — G894 Chronic pain syndrome: Secondary | ICD-10-CM

## 2018-12-07 NOTE — Patient Instructions (Signed)
Thank you allowing the Chronic Care Management Team to be a part of your care! It was a pleasure speaking with you today!   CCM (Chronic Care Management) Team   Yuridiana Formanek RN, BSN Nurse Care Coordinator  [phone]  Harlow Asa PharmD  Clinical Pharmacist  [phone]  Eula Fried LCSW Clinical Social Worker [phone]  Goals Addressed            This Visit's Progress   . I would like to continue my therapy at home (pt-stated)       Current Barriers:  [name] Kitchen Knowledge Deficits related to how to obtain [facility] PT/OT in  her home  Nurse Case Manager Clinical Goal(s):  [name] Kitchen Over the next 90 days, patient will work with [facility] and PCP office to address needs related to obtaining [place] OT/PT in her home.   Interventions:  . Collaborated with PCP office regarding orders for [facility] OT/PT . Discussed plans with patient for ongoing care management follow up and provided patient with direct contact information for care management team . Spoke with patient's [place] "Alferd Patee with patient's permission. She states she feels patient definitely could use in home therapy. She stated the patient has regressed since stopping therapy in March related to Covid-19 restrictions. Care Giver Number [phone]  . Placed a call to [facility] and made them aware to schedule PT/OT with caregiver Tawanna Sat per patient's request, orders to be faxed to [phone] . PCP Office to fax over orders  Patient Self Care Activities:  . Currently UNABLE TO independently complete ADLs or IADLs would benefit from [facility] OT/PT.   Initial goal documentation        The patient verbalized understanding of instructions provided today and declined a print copy of patient instruction materials.   The care management team will reach out to the patient again over the next 14 days.  The patient has been provided with contact information for the care management team and has been advised to call  with any health related questions or concerns.

## 2018-12-07 NOTE — Chronic Care Management (AMB) (Signed)
  Chronic Care Management   Follow Up Note   12/07/2018 Name: [name] MRN: [id number] DOB: Nov 14, 1955  Referred by: [name], DO Reason for referral : No chief complaint on file.   [name] is a 63 y.o. year old female who is a primary care patient of [name], DO. The CCM team was consulted for assistance with chronic disease management and care coordination needs.    Review of patient status, including review of consultants reports, relevant laboratory and other test results, and collaboration with appropriate care team members and the patient's provider was performed as part of comprehensive patient evaluation and provision of chronic care management services.    Goals Addressed            This Visit's Progress   . I would like to continue my therapy at home (pt-stated)       Current Barriers:  [name] Kitchen Knowledge Deficits related to how to obtain [facility] PT/OT in  her home  Nurse Case Manager Clinical Goal(s):  [name] Kitchen Over the next 90 days, patient will work with [facility] and PCP office to address needs related to obtaining [place] OT/PT in her home.   Interventions:  . Collaborated with PCP office regarding orders for [facility] OT/PT . Discussed plans with patient for ongoing care management follow up and provided patient with direct contact information for care management team . Spoke with patient's [place] "Alferd Patee with patient's permission. She states she feels patient definitely could use in home therapy. She stated the patient has regressed since stopping therapy in March related to Covid-19 restrictions. Care Giver Number [phone]  . Placed a call to [facility] and made them aware to schedule PT/OT with caregiver Tawanna Sat per patient's request, orders to be faxed to [phone] . PCP Office to fax over orders  Patient Self Care Activities:  . Currently UNABLE TO independently complete ADLs or IADLs would benefit from [facility]  OT/PT.   Initial goal documentation         The care management team will reach out to the patient again over the next 14 days.  The patient has been provided with contact information for the care management team and has been advised to call with any health related questions or concerns.   Merlene Morse Jashay Roddy RN, BSN Nurse Case Pharmacist, [facility] Care Management  [phone]) Business Mobile

## 2018-12-07 NOTE — Telephone Encounter (Signed)
Faxed as per Janci's message.

## 2018-12-07 NOTE — Telephone Encounter (Signed)
Received notification today 6/15 from [place]  That her out patient PT/OT was halted related to Covid-19. She stated she would like to pursue [facility] PT/OT  fax PT/OT orders to [facility] at [phone]  handwritten order completed, ready to be faxed.  Nobie Putnam, [facility] 12/07/2018, 12:13 PM

## 2018-12-21 ENCOUNTER — Telehealth: Payer: Self-pay

## 2018-12-21 ENCOUNTER — Ambulatory Visit: Payer: Self-pay | Admitting: *Deleted

## 2018-12-21 DIAGNOSIS — G35 Multiple sclerosis: Secondary | ICD-10-CM

## 2018-12-21 NOTE — Patient Instructions (Signed)
Thank you allowing the Chronic Care Management Team to be a part of your care! It was a pleasure speaking with you today!   CCM (Chronic Care Management) Team   Linsay Vogt RN, BSN Nurse Care Coordinator  [phone]  Harlow Asa PharmD  Clinical Pharmacist  [phone]  Eula Fried LCSW Clinical Social Worker [phone]  Goals Addressed            This Visit's Progress   . I would like to continue my therapy at home (pt-stated)       Current Barriers:  [name] Kitchen Knowledge Deficits related to how to obtain [facility] PT/OT in  her home  Nurse Case Manager Clinical Goal(s):  [name] Kitchen Over the next 90 days, patient will work with [facility] and PCP office to address needs related to obtaining [place] OT/PT in her home.   Interventions:  . Placed a call to patient's caregiver [name], no answer but was able to leave a message. . Placed a call to [facility] to inquire if they were able to start [facility] services. Rep Cecille Rubin states they have called multiple times to attempt set up but have been unable to reach patient.  . Placed a call to patient's son, unable to leave a VM . Placed a call to patient, she stated she continues to want PT but would like for it to be set up through her caregiver [name]. Made her aware several attempts have been made to set up the [facility] and to please have [name] call me back.  . Made [facility] Cecille Rubin aware patient still wanting [place] services and requested they attempt to call and schedule again.   Patient Self Care Activities:  . Currently UNABLE TO independently complete ADLs or IADLs would benefit from [facility] OT/PT.   Please see past updates related to this goal by clicking on the "Past Updates" button in the selected goal         The patient verbalized understanding of instructions provided today and declined a print copy of patient instruction materials.   The patient has been provided with contact information for the care management team and has been advised to call with any health  related questions or concerns.

## 2018-12-21 NOTE — Chronic Care Management (AMB) (Signed)
  Chronic Care Management   Follow Up Note   12/21/2018 Name: [name] MRN: [id number] DOB: [birth year]  Referred by: [name], DO Reason for referral : Chronic Care Management (F/U Call ) and Care Coordination [facility] set up )   [name] is a 63 y.o. year old female who is a primary care patient of [name], DO. The CCM team was consulted for assistance with chronic disease management and care coordination needs.    Review of patient status, including review of consultants reports, relevant laboratory and other test results, and collaboration with appropriate care team members and the patient's provider was performed as part of comprehensive patient evaluation and provision of chronic care management services.    Goals Addressed            This Visit's Progress   . I would like to continue my therapy at home (pt-stated)       Current Barriers:  [name] Kitchen Knowledge Deficits related to how to obtain [facility] PT/OT in  her home  Nurse Case Manager Clinical Goal(s):  [name] Kitchen Over the next 90 days, patient will work with [facility] and PCP office to address needs related to obtaining [place] OT/PT in her home.   Interventions:  . Placed a call to patient's caregiver [name], no answer but was able to leave a message. . Placed a call to [facility] to inquire if they were able to start [facility] services. Rep Cecille Rubin states they have called multiple times to attempt set up but have been unable to reach patient.  . Placed a call to patient's son, unable to leave a VM . Placed a call to patient, she stated she continues to want PT but would like for it to be set up through her caregiver [name]. Made her aware several attempts have been made to set up the [facility] and to please have [name] call me back.  . Made [facility] Cecille Rubin aware patient still wanting [place] services and requested they attempt to call and schedule again.   Patient Self Care Activities:  . Currently UNABLE TO independently complete ADLs or  IADLs would benefit from [facility] OT/PT.   Please see past updates related to this goal by clicking on the "Past Updates" button in the selected goal          The care management team will reach out to the patient again over the next 30 days.  The patient has been provided with contact information for the care management team and has been advised to call with any health related questions or concerns.    Merlene Morse Makinze Jani RN, BSN Nurse Case Pharmacist, [facility] Care Management  [phone]) Business Mobile

## 2018-12-24 ENCOUNTER — Other Ambulatory Visit: Payer: Self-pay | Admitting: Family Medicine

## 2018-12-24 DIAGNOSIS — M8589 Other specified disorders of bone density and structure, multiple sites: Secondary | ICD-10-CM

## 2018-12-28 ENCOUNTER — Telehealth: Payer: Self-pay

## 2018-12-28 ENCOUNTER — Ambulatory Visit: Payer: Self-pay | Admitting: *Deleted

## 2018-12-28 DIAGNOSIS — G35 Multiple sclerosis: Secondary | ICD-10-CM

## 2018-12-28 NOTE — Chronic Care Management (AMB) (Signed)
  Chronic Care Management   Follow Up Note   12/28/2018 Name: [name] MRN: [id number] DOB: May 22, 1956  Referred by: [name], DO Reason for referral : Care Coordination [facility] PT)   [name] is a 63 y.o. year old female who is a primary care patient of [name], DO. The CCM team was consulted for assistance with chronic disease management and care coordination needs.    Review of patient status, including review of consultants reports, relevant laboratory and other test results, and collaboration with appropriate care team members and the patient's provider was performed as part of comprehensive patient evaluation and provision of chronic care management services.    Goals Addressed            This Visit's Progress   . I would like to continue my therapy at home (pt-stated)       Current Barriers:  [name] Kitchen Knowledge Deficits related to how to obtain [name]  Nurse Case Manager Clinical Goal(s):  [name] Kitchen Over the next 90 days, patient will work with [name] and PCP office to address needs related to obtaining [name] in her home.   Interventions:  . Placed a call to patient's caregiver [name], no answer but was able to leave a message. . Placed a call to [facility] to inquire if they were able to start [facility] services left a message for Rep Cecille Rubin.   Patient Self Care Activities:  . Currently UNABLE TO independently complete ADLs or IADLs would benefit from [facility] OT/PT.   Please see past updates related to this goal by clicking on the "Past Updates" button in the selected goal          The care management team will reach out to the patient again over the next 30 days.  The patient has been provided with contact information for the care management team and has been advised to call with any health related questions or concerns.    Merlene Morse Verdia Bolt RN, BSN Nurse Case Pharmacist, [facility] Care Management  [phone]) Business  Mobile

## 2019-01-06 ENCOUNTER — Other Ambulatory Visit: Payer: Self-pay | Admitting: Family Medicine

## 2019-01-06 DIAGNOSIS — N319 Neuromuscular dysfunction of bladder, unspecified: Secondary | ICD-10-CM

## 2019-01-06 DIAGNOSIS — E782 Mixed hyperlipidemia: Secondary | ICD-10-CM

## 2019-01-06 DIAGNOSIS — K219 Gastro-esophageal reflux disease without esophagitis: Secondary | ICD-10-CM

## 2019-01-06 DIAGNOSIS — G894 Chronic pain syndrome: Secondary | ICD-10-CM

## 2019-01-06 DIAGNOSIS — G35 Multiple sclerosis: Secondary | ICD-10-CM

## 2019-01-06 DIAGNOSIS — R32 Unspecified urinary incontinence: Secondary | ICD-10-CM

## 2019-01-08 ENCOUNTER — Ambulatory Visit: Payer: Self-pay | Admitting: *Deleted

## 2019-01-08 DIAGNOSIS — G35 Multiple sclerosis: Secondary | ICD-10-CM

## 2019-01-08 NOTE — Chronic Care Management (AMB) (Signed)
  Chronic Care Management   Follow Up Note   01/08/2019 Name: [name] MRN: [id number] DOB: [birth year]  Referred by: [name], DO Reason for referral : Care Coordination (Home health)   [name] is a 63 y.o. year old female who is a primary care patient of [name], DO. The CCM team was consulted for assistance with chronic disease management and care coordination needs.    Review of patient status, including review of consultants reports, relevant laboratory and other test results, and collaboration with appropriate care team members and the patient's provider was performed as part of comprehensive patient evaluation and provision of chronic care management services.    Goals Addressed            This Visit's Progress   . I would like to continue my therapy at home (pt-stated)       Current Barriers:  [name] Kitchen Knowledge Deficits related to how to obtain [facility] PT/OT in  her home  Nurse Case Manager Clinical Goal(s):  [name] Kitchen Over the next 90 days, patient will work with [facility] and PCP office to address needs related to obtaining [place] OT/PT in her home.   Interventions:  . [facility] rep Reche Dixon called to let me know after repeated attempts the were unable to contact patient or care giver Alferd Patee.  Unable to complete HH order.  Patient Self Care Activities:  . Currently UNABLE TO independently complete ADLs or IADLs would benefit from [facility] OT/PT.   Please see past updates related to this goal by clicking on the "Past Updates" button in the selected goal          The care management team will reach out to the patient again over the next 30 days.  The patient has been provided with contact information for the care management team and has been advised to call with any health related questions or concerns.    Merlene Morse Yovanna Cogan RN, BSN Nurse Case Pharmacist, [facility] Care Management  [phone]) Business Mobile

## 2019-01-25 ENCOUNTER — Telehealth: Payer: Self-pay

## 2019-01-25 ENCOUNTER — Ambulatory Visit: Payer: Self-pay | Admitting: *Deleted

## 2019-01-25 DIAGNOSIS — G35 Multiple sclerosis: Secondary | ICD-10-CM

## 2019-01-25 NOTE — Chronic Care Management (AMB) (Signed)
  Chronic Care Management   Follow Up Note   01/25/2019 Name: JANISE GORA MRN: [id number] DOB: [birth year]  Referred by: [name], DO Reason for referral : Chronic Care Management (MS)   LINDSEY DEMONTE is a 63 y.o. year old female who is a primary care patient of [name], DO. The CCM team was consulted for assistance with chronic disease management and care coordination needs.    Review of patient status, including review of consultants reports, relevant laboratory and other test results, and collaboration with appropriate care team members and the patient's provider was performed as part of comprehensive patient evaluation and provision of chronic care management services.   Unable to maintain contact with patient.     Goals Addressed            This Visit's Progress   . COMPLETED: I would like to continue my therapy at home (pt-stated)       Current Barriers:  [name] Kitchen Knowledge Deficits related to how to obtain [facility] PT/OT in  her home  Nurse Case Manager Clinical Goal(s):  [name] Kitchen Over the next 90 days, patient will work with [facility] and PCP office to address needs related to obtaining [place] OT/PT in her home.   Interventions:  . Have made several attempts to contact patient, son and caregiver without success. [name] Kitchen Homehealth without success in reaching patient or caregiver . Have made PCP aware of inability to keep in contact with patient.   Patient Self Care Activities:  . Currently UNABLE TO independently complete ADLs or IADLs would benefit from [facility] OT/PT.   Please see past updates related to this goal by clicking on the "Past Updates" button in the selected goal          The care management team is available to follow up with the patient after provider conversation with the patient regarding recommendation for care management engagement and subsequent re-referral to the care management team.   Dquan Cortopassi RN, BSN Nurse Case Manager [facility] Care Management  [phone]) Business Mobile

## 2019-02-17 ENCOUNTER — Other Ambulatory Visit: Payer: Self-pay | Admitting: Family Medicine

## 2019-02-17 DIAGNOSIS — J432 Centrilobular emphysema: Secondary | ICD-10-CM

## 2019-03-08 ENCOUNTER — Other Ambulatory Visit: Payer: Self-pay | Admitting: Family Medicine

## 2019-03-08 DIAGNOSIS — M62830 Muscle spasm of back: Secondary | ICD-10-CM

## 2019-03-18 ENCOUNTER — Ambulatory Visit (INDEPENDENT_AMBULATORY_CARE_PROVIDER_SITE_OTHER): Payer: Medicare Other | Admitting: Family Medicine

## 2019-03-18 ENCOUNTER — Encounter: Payer: Self-pay | Admitting: Family Medicine

## 2019-03-18 ENCOUNTER — Other Ambulatory Visit: Payer: Self-pay

## 2019-03-18 ENCOUNTER — Other Ambulatory Visit: Payer: Self-pay | Admitting: Family Medicine

## 2019-03-18 DIAGNOSIS — Z1211 Encounter for screening for malignant neoplasm of colon: Secondary | ICD-10-CM

## 2019-03-18 DIAGNOSIS — J432 Centrilobular emphysema: Secondary | ICD-10-CM

## 2019-03-18 DIAGNOSIS — M8589 Other specified disorders of bone density and structure, multiple sites: Secondary | ICD-10-CM | POA: Diagnosis not present

## 2019-03-18 DIAGNOSIS — Z1239 Encounter for other screening for malignant neoplasm of breast: Secondary | ICD-10-CM | POA: Diagnosis not present

## 2019-03-18 DIAGNOSIS — G894 Chronic pain syndrome: Secondary | ICD-10-CM | POA: Diagnosis not present

## 2019-03-18 DIAGNOSIS — G35 Multiple sclerosis: Secondary | ICD-10-CM

## 2019-03-18 MED ORDER — ALENDRONATE SODIUM 70 MG PO TABS: ORAL_TABLET | ORAL | 3 refills | Status: DC

## 2019-03-18 MED ORDER — TRAMADOL HCL 50 MG PO TABS: 50.0000 mg | ORAL_TABLET | Freq: Every evening | ORAL | 0 refills | Status: DC | PRN

## 2019-03-18 NOTE — Patient Instructions (Signed)
AVS given by phone. 

## 2019-03-18 NOTE — Assessment & Plan Note (Signed)
Chronic relapsing remitting MS. Currently stable without flare Complicated with neurogenic bladder, see A&P Followed by [name] Neurology Dr [name] Continue current MS targeted therapy per med list Managed with outpatient PT - improving still, Left lower extremity weakness, now working on upper ext and back as well Pain management on Tramadol, Gabapentin  Continue Baclofen, refilled Refill Tramadol Proceed with [facility] HH PT - will contact Vicki Minor LPN for CCM - previously attempted to reach patient/caregiver, to resume this [facility] service.

## 2019-03-18 NOTE — Progress Notes (Signed)
Virtual Visit via Telephone The purpose of this virtual visit is to provide medical care while limiting exposure to the novel coronavirus (COVID19) for both patient and office staff.  Consent was obtained for phone visit:  Yes.   Answered questions that patient had about telehealth interaction:  Yes.   I discussed the limitations, risks, security and privacy concerns of performing an evaluation and management service by telephone. I also discussed with the patient that there may be a patient responsible charge related to this service. The patient expressed understanding and agreed to proceed.  Patient Location: Home Provider Location: [facility])  ---------------------------------------------------------------------- Chief Complaint  Patient presents with  . Back Pain    needs refill for Tramadol, inhaler, fosamex--Wanda Ebony Hail PCA assist during the phone call.    S: Reviewed CMA documentation. I have called patient and spoke with both patient and Mariann Laster caregiver, and gathered additional HPI as follows:  Multiple Sclerosis, Chronic relapsing remitting / Neurogenic Bladder/ Chronic Pain Syndrome Followed by [facility], she missed her last visit with Neurology unable to see her due to pandemic had to be re-scheduled. Was doing PT prior to pandemic, then she needed assistance coordinating with [place], unable to schedule - requesting additional help with this now to resume [facility] HH PT. Chronic Left leg drop, working on improving this with PT No new concern or flare with MS - Regarding MS, on Ampyra medication according to Neuro - For pain and function she takes the following: - Taking Tramadol 50mg  nightly PRN - needs refill - Taking Gabapentin 300mg  x 2 in AM, 2 in afternoon, and 3 PM, slightly increased dose now with good result - Stable to unchanged - Difficulty getting out of bed at times, but once up she can function - Has issues with neurogenic bladder from MS,  still issues with some overflow and urinary incontinence, she has urge to go, now more frequently, improved on Myrbetriq 50mg  daily, still on using pads / depends for medical supply for her urinary incontinence that is needed to help keep her dry and avoid recurrent UTI. She is doing better with hygiene and cleaning after soiling. Admits still episodic low back pain at times, worse at night, sore and stiff muscles, has tried muscle relaxant in past was helpful. Admits chronic L sided weakness, some episodes of intermittent swelling seem related to inactivity, improve with rest and elevation - seems improved Admits urinary incontinence Denies dysuria, fever chills, hematuria  Denies any high risk travel to areas of current concern for COVID19. Denies any known or suspected exposure to person with or possibly with COVID19.  Denies any fevers, chills, sweats, body ache, cough, shortness of breath, sinus pain or pressure, headache, abdominal pain, diarrhea  HM Due for Mammogram. Adivsed to call [facility]. Orders in system already.  Due for Colon CA Screening - provided info on home cologuard testing, ordered.  Past Medical History:  Diagnosis Date  . Headache   . Hyperlipidemia   . MS (multiple sclerosis) ([place]) 1990  . Neurogenic bladder    Social History   Tobacco Use  . Smoking status: Current Every Day Smoker    Packs/day: 1.00    Years: 20.00    Pack years: 20.00    Last attempt to quit: 01/03/1995    Years since quitting: 24.2  . Smokeless tobacco: Current User  Substance Use Topics  . Alcohol use: No    Alcohol/week: 0.0 standard drinks  . Drug use: Yes  Comment: smokes pods in past    Current Outpatient Medications:  .  albuterol (PROVENTIL) (2.5 MG/3ML) 0.083% nebulizer solution, Take 3 mLs (2.5 mg total) by nebulization every 6 (six) hours as needed for wheezing or shortness of breath., Disp: 150 mL, Rfl: 1 .  albuterol (VENTOLIN HFA) 108 (90 Base) MCG/ACT inhaler,  USE 2 PUFFS EVERY SIX HOURS AS NEEDED FOR WHEEZING OR SHORTNESS OF BREATH., Disp: 8.5 g, Rfl: 3 .  alendronate (FOSAMAX) 70 MG tablet, TAKE 1 TABLET BY MOUTH EVERY 7 (SEVEN) DAYS. TAKE WITH A FULL GLASS OF WATER ON AN EMPTY STOMACH., Disp: 12 tablet, Rfl: 3 .  baclofen (LIORESAL) 10 MG tablet, TAKE 1 TABLET BY MOUTH AT BEDTIME AS NEEDED FOR MUSCLE SPASMS, Disp: 30 tablet, Rfl: 2 .  dalfampridine (AMPYRA) 10 MG TB12, Take 10 mg by mouth 2 (two) times daily., Disp: , Rfl:  .  Dimethyl Fumarate (TECFIDERA) 240 MG CPDR, Take 240 mg by mouth 2 (two) times daily., Disp: , Rfl:  .  gabapentin (NEURONTIN) 300 MG capsule, TAKE 2 CAPSULES (600 MG TOTAL) BY MOUTH 3 (THREE) TIMES DAILY., Disp: 540 capsule, Rfl: 1 .  Misc. Devices (CANE) MISC, 1 each by Does not apply route as needed., Disp: 1 each, Rfl: 0 .  MYRBETRIQ 50 MG TB24 tablet, TAKE 1 TABLET BY MOUTH EVERY DAY, Disp: 90 tablet, Rfl: 1 .  pantoprazole (PROTONIX) 40 MG tablet, TAKE 1 TABLET BY MOUTH DAILY BEFORE BREAKFAST, Disp: 90 tablet, Rfl: 1 .  pravastatin (PRAVACHOL) 40 MG tablet, TAKE 1 TABLET BY MOUTH EVERY DAY, Disp: 90 tablet, Rfl: 1 .  traMADol (ULTRAM) 50 MG tablet, Take 1 tablet (50 mg total) by mouth at bedtime as needed for moderate pain., Disp: 30 tablet, Rfl: 0 .  SPIRIVA HANDIHALER 18 MCG inhalation capsule, INHALE 1 CAPSULE VIA HANDIHALER ONCE DAILY AT THE SAME TIME EVERY DAY, Disp: 90 capsule, Rfl: 1  Depression screen [facility] 2/9 03/18/2019 08/19/2018 05/12/2018  Decreased Interest 0 0 0  Down, Depressed, Hopeless 0 0 1  PHQ - 2 Score 0 0 1  Altered sleeping - 0 -  Tired, decreased energy - 0 -  Change in appetite - 0 -  Feeling bad or failure about yourself  - 0 -  Trouble concentrating - 0 -  Moving slowly or fidgety/restless - 0 -  Suicidal thoughts - 0 -  PHQ-9 Score - 0 -  Difficult doing work/chores - Not difficult at all -    No flowsheet data  found.  -------------------------------------------------------------------------- O: No physical exam performed due to remote telephone encounter.  Lab results reviewed.  No results found for this or any previous visit (from the past 2160 hour(s)).  -------------------------------------------------------------------------- A&P:  Problem List Items Addressed This Visit    Chronic pain   Relevant Medications   traMADol (ULTRAM) 50 MG tablet   Multiple sclerosis, relapsing-remitting (HCC) - Primary    Chronic relapsing remitting MS. Currently stable without flare Complicated with neurogenic bladder, see A&P Followed by [facility] Dr Malvin Johns Continue current MS targeted therapy per med list Managed with outpatient PT - improving still, Left lower extremity weakness, now working on upper ext and back as well Pain management on Tramadol, Gabapentin  Continue Baclofen, refilled Refill Tramadol Proceed with [facility] HH PT - will contact Janci Minor LPN for CCM - previously attempted to reach patient/caregiver, to resume this [facility] service.      Relevant Medications   traMADol (ULTRAM) 50 MG tablet   Osteopenia  Relevant Medications   alendronate (FOSAMAX) 70 MG tablet    Other Visit Diagnoses    Screening for colon cancer       Relevant Orders   Cologuard   Screening for breast cancer          Meds ordered this encounter  Medications  . traMADol (ULTRAM) 50 MG tablet    Sig: Take 1 tablet (50 mg total) by mouth at bedtime as needed for moderate pain.    Dispense:  30 tablet    Refill:  0  . alendronate (FOSAMAX) 70 MG tablet    Sig: TAKE 1 TABLET BY MOUTH EVERY 7 (SEVEN) DAYS. TAKE WITH A FULL GLASS OF WATER ON AN EMPTY STOMACH.    Dispense:  12 tablet    Refill:  3    Follow-up: - Return in 3-6 months  Patient verbalizes understanding with the above medical recommendations including the limitation of remote medical advice.  Specific follow-up and call-back  criteria were given for patient to follow-up or seek medical care more urgently if needed.   - Time spent in direct consultation with patient on phone: 11 minutes   Saralyn PilarAlexander Aidah Forquer, DO [facility] 03/18/2019, 10:17 AM

## 2019-03-25 ENCOUNTER — Ambulatory Visit: Payer: Self-pay | Admitting: *Deleted

## 2019-03-25 ENCOUNTER — Telehealth: Payer: Self-pay

## 2019-03-25 DIAGNOSIS — G35 Multiple sclerosis: Secondary | ICD-10-CM

## 2019-03-25 NOTE — Chronic Care Management (AMB) (Addendum)
  Chronic Care Management   Outreach Note  03/25/2019 Name: [name] MRN: [id number] DOB: 06/20/1956  Referred by: [name], DO Reason for referral : Care Coordination (Unsuccessful Outreach)  Attempted to outreach patient's assistant to assist with set up of [facility] PT. Also placed a call to patient's Case Worker Margrett Rud and left a VM. Kennyth Lose called me back and stated she no longer works with this patient. She requested I take her number off the chart. -Completed An unsuccessful telephone outreach was attempted today. The patient was referred to the case management team by for assistance with care management and care coordination.   Follow Up Plan: A HIPPA compliant phone message was left for the patient providing contact information and requesting a return call.  The care management team will reach out to the patient again over the next 30 days.    Merlene Morse Jamason Peckham RN, BSN Nurse Case Pharmacist, [facility]  [phone]) Business Mobile

## 2019-03-29 ENCOUNTER — Other Ambulatory Visit: Payer: Self-pay | Admitting: Family Medicine

## 2019-03-29 DIAGNOSIS — N319 Neuromuscular dysfunction of bladder, unspecified: Secondary | ICD-10-CM

## 2019-03-29 DIAGNOSIS — G35 Multiple sclerosis: Secondary | ICD-10-CM

## 2019-03-29 DIAGNOSIS — G894 Chronic pain syndrome: Secondary | ICD-10-CM

## 2019-04-01 ENCOUNTER — Telehealth: Payer: Self-pay

## 2019-04-02 DIAGNOSIS — E559 Vitamin D deficiency, unspecified: Secondary | ICD-10-CM | POA: Diagnosis not present

## 2019-04-02 DIAGNOSIS — K589 Irritable bowel syndrome without diarrhea: Secondary | ICD-10-CM | POA: Diagnosis not present

## 2019-04-02 DIAGNOSIS — G894 Chronic pain syndrome: Secondary | ICD-10-CM | POA: Diagnosis not present

## 2019-04-02 DIAGNOSIS — N319 Neuromuscular dysfunction of bladder, unspecified: Secondary | ICD-10-CM | POA: Diagnosis not present

## 2019-04-02 DIAGNOSIS — D631 Anemia in chronic kidney disease: Secondary | ICD-10-CM | POA: Diagnosis not present

## 2019-04-02 DIAGNOSIS — N189 Chronic kidney disease, unspecified: Secondary | ICD-10-CM | POA: Diagnosis not present

## 2019-04-02 DIAGNOSIS — E782 Mixed hyperlipidemia: Secondary | ICD-10-CM | POA: Diagnosis not present

## 2019-04-02 DIAGNOSIS — H548 Legal blindness, as defined in USA: Secondary | ICD-10-CM | POA: Diagnosis not present

## 2019-04-02 DIAGNOSIS — J432 Centrilobular emphysema: Secondary | ICD-10-CM | POA: Diagnosis not present

## 2019-04-02 DIAGNOSIS — G35 Multiple sclerosis: Secondary | ICD-10-CM | POA: Diagnosis not present

## 2019-04-02 DIAGNOSIS — M545 Low back pain: Secondary | ICD-10-CM | POA: Diagnosis not present

## 2019-04-02 DIAGNOSIS — M8589 Other specified disorders of bone density and structure, multiple sites: Secondary | ICD-10-CM | POA: Diagnosis not present

## 2019-04-02 DIAGNOSIS — I129 Hypertensive chronic kidney disease with stage 1 through stage 4 chronic kidney disease, or unspecified chronic kidney disease: Secondary | ICD-10-CM | POA: Diagnosis not present

## 2019-04-06 DIAGNOSIS — D631 Anemia in chronic kidney disease: Secondary | ICD-10-CM | POA: Diagnosis not present

## 2019-04-06 DIAGNOSIS — G894 Chronic pain syndrome: Secondary | ICD-10-CM | POA: Diagnosis not present

## 2019-04-06 DIAGNOSIS — M8589 Other specified disorders of bone density and structure, multiple sites: Secondary | ICD-10-CM | POA: Diagnosis not present

## 2019-04-06 DIAGNOSIS — K589 Irritable bowel syndrome without diarrhea: Secondary | ICD-10-CM | POA: Diagnosis not present

## 2019-04-06 DIAGNOSIS — J432 Centrilobular emphysema: Secondary | ICD-10-CM | POA: Diagnosis not present

## 2019-04-06 DIAGNOSIS — N189 Chronic kidney disease, unspecified: Secondary | ICD-10-CM | POA: Diagnosis not present

## 2019-04-06 DIAGNOSIS — E782 Mixed hyperlipidemia: Secondary | ICD-10-CM | POA: Diagnosis not present

## 2019-04-06 DIAGNOSIS — H548 Legal blindness, as defined in USA: Secondary | ICD-10-CM | POA: Diagnosis not present

## 2019-04-06 DIAGNOSIS — E559 Vitamin D deficiency, unspecified: Secondary | ICD-10-CM | POA: Diagnosis not present

## 2019-04-06 DIAGNOSIS — N319 Neuromuscular dysfunction of bladder, unspecified: Secondary | ICD-10-CM | POA: Diagnosis not present

## 2019-04-06 DIAGNOSIS — I129 Hypertensive chronic kidney disease with stage 1 through stage 4 chronic kidney disease, or unspecified chronic kidney disease: Secondary | ICD-10-CM | POA: Diagnosis not present

## 2019-04-06 DIAGNOSIS — M545 Low back pain: Secondary | ICD-10-CM | POA: Diagnosis not present

## 2019-04-06 DIAGNOSIS — G35 Multiple sclerosis: Secondary | ICD-10-CM | POA: Diagnosis not present

## 2019-04-07 DIAGNOSIS — G35 Multiple sclerosis: Secondary | ICD-10-CM | POA: Diagnosis not present

## 2019-04-07 DIAGNOSIS — H548 Legal blindness, as defined in USA: Secondary | ICD-10-CM | POA: Diagnosis not present

## 2019-04-07 DIAGNOSIS — K589 Irritable bowel syndrome without diarrhea: Secondary | ICD-10-CM | POA: Diagnosis not present

## 2019-04-07 DIAGNOSIS — I129 Hypertensive chronic kidney disease with stage 1 through stage 4 chronic kidney disease, or unspecified chronic kidney disease: Secondary | ICD-10-CM | POA: Diagnosis not present

## 2019-04-07 DIAGNOSIS — M8589 Other specified disorders of bone density and structure, multiple sites: Secondary | ICD-10-CM | POA: Diagnosis not present

## 2019-04-07 DIAGNOSIS — J432 Centrilobular emphysema: Secondary | ICD-10-CM | POA: Diagnosis not present

## 2019-04-07 DIAGNOSIS — M545 Low back pain: Secondary | ICD-10-CM | POA: Diagnosis not present

## 2019-04-07 DIAGNOSIS — D631 Anemia in chronic kidney disease: Secondary | ICD-10-CM | POA: Diagnosis not present

## 2019-04-07 DIAGNOSIS — E559 Vitamin D deficiency, unspecified: Secondary | ICD-10-CM | POA: Diagnosis not present

## 2019-04-07 DIAGNOSIS — G894 Chronic pain syndrome: Secondary | ICD-10-CM | POA: Diagnosis not present

## 2019-04-07 DIAGNOSIS — E782 Mixed hyperlipidemia: Secondary | ICD-10-CM | POA: Diagnosis not present

## 2019-04-07 DIAGNOSIS — N189 Chronic kidney disease, unspecified: Secondary | ICD-10-CM | POA: Diagnosis not present

## 2019-04-07 DIAGNOSIS — N319 Neuromuscular dysfunction of bladder, unspecified: Secondary | ICD-10-CM | POA: Diagnosis not present

## 2019-04-09 DIAGNOSIS — E782 Mixed hyperlipidemia: Secondary | ICD-10-CM | POA: Diagnosis not present

## 2019-04-09 DIAGNOSIS — K589 Irritable bowel syndrome without diarrhea: Secondary | ICD-10-CM | POA: Diagnosis not present

## 2019-04-09 DIAGNOSIS — I129 Hypertensive chronic kidney disease with stage 1 through stage 4 chronic kidney disease, or unspecified chronic kidney disease: Secondary | ICD-10-CM | POA: Diagnosis not present

## 2019-04-09 DIAGNOSIS — M8589 Other specified disorders of bone density and structure, multiple sites: Secondary | ICD-10-CM | POA: Diagnosis not present

## 2019-04-09 DIAGNOSIS — D631 Anemia in chronic kidney disease: Secondary | ICD-10-CM | POA: Diagnosis not present

## 2019-04-09 DIAGNOSIS — G35 Multiple sclerosis: Secondary | ICD-10-CM | POA: Diagnosis not present

## 2019-04-09 DIAGNOSIS — G894 Chronic pain syndrome: Secondary | ICD-10-CM | POA: Diagnosis not present

## 2019-04-09 DIAGNOSIS — E559 Vitamin D deficiency, unspecified: Secondary | ICD-10-CM | POA: Diagnosis not present

## 2019-04-09 DIAGNOSIS — M545 Low back pain: Secondary | ICD-10-CM | POA: Diagnosis not present

## 2019-04-09 DIAGNOSIS — J432 Centrilobular emphysema: Secondary | ICD-10-CM | POA: Diagnosis not present

## 2019-04-09 DIAGNOSIS — N189 Chronic kidney disease, unspecified: Secondary | ICD-10-CM | POA: Diagnosis not present

## 2019-04-09 DIAGNOSIS — H548 Legal blindness, as defined in USA: Secondary | ICD-10-CM | POA: Diagnosis not present

## 2019-04-09 DIAGNOSIS — N319 Neuromuscular dysfunction of bladder, unspecified: Secondary | ICD-10-CM | POA: Diagnosis not present

## 2019-04-13 DIAGNOSIS — G894 Chronic pain syndrome: Secondary | ICD-10-CM | POA: Diagnosis not present

## 2019-04-13 DIAGNOSIS — H548 Legal blindness, as defined in USA: Secondary | ICD-10-CM | POA: Diagnosis not present

## 2019-04-13 DIAGNOSIS — N189 Chronic kidney disease, unspecified: Secondary | ICD-10-CM | POA: Diagnosis not present

## 2019-04-13 DIAGNOSIS — E559 Vitamin D deficiency, unspecified: Secondary | ICD-10-CM | POA: Diagnosis not present

## 2019-04-13 DIAGNOSIS — M8589 Other specified disorders of bone density and structure, multiple sites: Secondary | ICD-10-CM | POA: Diagnosis not present

## 2019-04-13 DIAGNOSIS — M545 Low back pain: Secondary | ICD-10-CM | POA: Diagnosis not present

## 2019-04-13 DIAGNOSIS — E782 Mixed hyperlipidemia: Secondary | ICD-10-CM | POA: Diagnosis not present

## 2019-04-13 DIAGNOSIS — I129 Hypertensive chronic kidney disease with stage 1 through stage 4 chronic kidney disease, or unspecified chronic kidney disease: Secondary | ICD-10-CM | POA: Diagnosis not present

## 2019-04-13 DIAGNOSIS — D631 Anemia in chronic kidney disease: Secondary | ICD-10-CM | POA: Diagnosis not present

## 2019-04-13 DIAGNOSIS — G35 Multiple sclerosis: Secondary | ICD-10-CM | POA: Diagnosis not present

## 2019-04-13 DIAGNOSIS — N319 Neuromuscular dysfunction of bladder, unspecified: Secondary | ICD-10-CM | POA: Diagnosis not present

## 2019-04-13 DIAGNOSIS — K589 Irritable bowel syndrome without diarrhea: Secondary | ICD-10-CM | POA: Diagnosis not present

## 2019-04-13 DIAGNOSIS — J432 Centrilobular emphysema: Secondary | ICD-10-CM | POA: Diagnosis not present

## 2019-04-15 ENCOUNTER — Ambulatory Visit: Payer: Self-pay | Admitting: *Deleted

## 2019-04-15 DIAGNOSIS — N189 Chronic kidney disease, unspecified: Secondary | ICD-10-CM | POA: Diagnosis not present

## 2019-04-15 DIAGNOSIS — H548 Legal blindness, as defined in USA: Secondary | ICD-10-CM | POA: Diagnosis not present

## 2019-04-15 DIAGNOSIS — N319 Neuromuscular dysfunction of bladder, unspecified: Secondary | ICD-10-CM | POA: Diagnosis not present

## 2019-04-15 DIAGNOSIS — J432 Centrilobular emphysema: Secondary | ICD-10-CM | POA: Diagnosis not present

## 2019-04-15 DIAGNOSIS — G35 Multiple sclerosis: Secondary | ICD-10-CM

## 2019-04-15 DIAGNOSIS — I129 Hypertensive chronic kidney disease with stage 1 through stage 4 chronic kidney disease, or unspecified chronic kidney disease: Secondary | ICD-10-CM | POA: Diagnosis not present

## 2019-04-15 DIAGNOSIS — M545 Low back pain: Secondary | ICD-10-CM | POA: Diagnosis not present

## 2019-04-15 DIAGNOSIS — K589 Irritable bowel syndrome without diarrhea: Secondary | ICD-10-CM | POA: Diagnosis not present

## 2019-04-15 DIAGNOSIS — D631 Anemia in chronic kidney disease: Secondary | ICD-10-CM | POA: Diagnosis not present

## 2019-04-15 DIAGNOSIS — E782 Mixed hyperlipidemia: Secondary | ICD-10-CM | POA: Diagnosis not present

## 2019-04-15 DIAGNOSIS — E559 Vitamin D deficiency, unspecified: Secondary | ICD-10-CM | POA: Diagnosis not present

## 2019-04-15 DIAGNOSIS — M8589 Other specified disorders of bone density and structure, multiple sites: Secondary | ICD-10-CM | POA: Diagnosis not present

## 2019-04-15 DIAGNOSIS — G894 Chronic pain syndrome: Secondary | ICD-10-CM | POA: Diagnosis not present

## 2019-04-15 NOTE — Patient Instructions (Signed)
Thank you allowing the Chronic Care Management Team to be a part of your care! It was a pleasure speaking with you today!   CCM (Chronic Care Management) Team   Felton Buczynski RN, BSN Nurse Care Coordinator  [phone]  Harlow Asa PharmD  Clinical Pharmacist  [phone]  Eula Fried LCSW Clinical Social Worker [phone]  Goals Addressed            This Visit's Progress   . COMPLETED: I would like to continue my therapy at home (pt-stated)       Current Barriers:  [name] Kitchen Knowledge Deficits related to how to obtain [facility] PT/OT in  her home  Nurse Case Manager Clinical Goal(s):  [name] Kitchen Over the next 90 days, patient will work with [facility] and PCP office to address needs related to obtaining [place] OT/PT in her home.   Interventions:  . Spoke with patient and caregiver this am.  . [facility] has started and patient is receiving PT.  [name] Kitchen Patient and caregiver deny further ccm needs at this time.   Patient Self Care Activities:  . Currently UNABLE TO independently complete ADLs or IADLs would benefit from [facility] OT/PT.   Please see past updates related to this goal by clicking on the "Past Updates" button in the selected goal         The patient verbalized understanding of instructions provided today and declined a print copy of patient instruction materials.   The patient has been provided with contact information for the care management team and has been advised to call with any health related questions or concerns.

## 2019-04-15 NOTE — Chronic Care Management (AMB) (Signed)
Chronic Care Management   Follow Up Note   04/15/2019 Name: [name] MRN: [id number] DOB: [birth year]  Referred by: Smitty Cords, DO Reason for referral : Chronic Care Management (F/U on [facility] )   [name] is a 63 y.o. year old female who is a primary care patient of Smitty Cords, DO. The CCM team was consulted for assistance with chronic disease management and care coordination needs.    Review of patient status, including review of consultants reports, relevant laboratory and other test results, and collaboration with appropriate care team members and the patient's provider was performed as part of comprehensive patient evaluation and provision of chronic care management services.    SDOH (Social Determinants of Health) screening performed today: Physical Activity. See Care Plan for related entries.   Advanced Directives Status: N See Care Plan and Vynca application for related entries.  Outpatient Encounter Medications as of 04/15/2019  Medication Sig  . albuterol (PROVENTIL) (2.5 MG/3ML) 0.083% nebulizer solution Take 3 mLs (2.5 mg total) by nebulization every 6 (six) hours as needed for wheezing or shortness of breath.  [name] Kitchen albuterol (VENTOLIN HFA) 108 (90 Base) MCG/ACT inhaler USE 2 PUFFS EVERY SIX HOURS AS NEEDED FOR WHEEZING OR SHORTNESS OF BREATH.  [name] Kitchen alendronate (FOSAMAX) 70 MG tablet TAKE 1 TABLET BY MOUTH EVERY 7 (SEVEN) DAYS. TAKE WITH A FULL GLASS OF WATER ON AN EMPTY STOMACH.  . baclofen (LIORESAL) 10 MG tablet TAKE 1 TABLET BY MOUTH AT BEDTIME AS NEEDED FOR MUSCLE SPASMS  . dalfampridine (AMPYRA) 10 MG TB12 Take 10 mg by mouth 2 (two) times daily.  . Dimethyl Fumarate (TECFIDERA) 240 MG CPDR Take 240 mg by mouth 2 (two) times daily.  [name] Kitchen gabapentin (NEURONTIN) 300 MG capsule TAKE 2 CAPSULES (600 MG TOTAL) BY MOUTH 3 (THREE) TIMES DAILY.  . Misc. Devices (CANE) MISC 1 each by Does not apply route as needed.  [name] Kitchen MYRBETRIQ 50 MG TB24 tablet TAKE 1  TABLET BY MOUTH EVERY DAY  . pantoprazole (PROTONIX) 40 MG tablet TAKE 1 TABLET BY MOUTH DAILY BEFORE BREAKFAST  . pravastatin (PRAVACHOL) 40 MG tablet TAKE 1 TABLET BY MOUTH EVERY DAY  . SPIRIVA HANDIHALER 18 MCG inhalation capsule INHALE 1 CAPSULE VIA HANDIHALER ONCE DAILY AT THE SAME TIME EVERY DAY  . traMADol (ULTRAM) 50 MG tablet Take 1 tablet (50 mg total) by mouth at bedtime as needed for moderate pain.   No facility-administered encounter medications on file as of 04/15/2019.      Goals Addressed            This Visit's Progress   . COMPLETED: I would like to continue my therapy at home (pt-stated)       Current Barriers:  [name] Kitchen Knowledge Deficits related to how to obtain [facility] PT/OT in  her home  Nurse Case Manager Clinical Goal(s):  [name] Kitchen Over the next 90 days, patient will work with [facility] and PCP office to address needs related to obtaining HH OT/PT in her home.   Interventions:  . Spoke with patient and caregiver this am.  . [facility] has started and patient is receiving PT.  [name] Kitchen Patient and caregiver deny further ccm needs at this time.   Patient Self Care Activities:  . Currently UNABLE TO independently complete ADLs or IADLs would benefit from [facility] OT/PT.   Please see past updates related to this goal by clicking on the "Past Updates" button in the selected goal  The patient has been provided with contact information for the care management team and has been advised to call with any health related questions or concerns.  No further follow up required: Patient and caregiver deny any further CCM needs   Brentt Fread RN, BSN Nurse Case Manager [facility] Care Management  [phone]) Business Mobile

## 2019-04-19 DIAGNOSIS — D631 Anemia in chronic kidney disease: Secondary | ICD-10-CM | POA: Diagnosis not present

## 2019-04-19 DIAGNOSIS — K589 Irritable bowel syndrome without diarrhea: Secondary | ICD-10-CM | POA: Diagnosis not present

## 2019-04-19 DIAGNOSIS — M8589 Other specified disorders of bone density and structure, multiple sites: Secondary | ICD-10-CM | POA: Diagnosis not present

## 2019-04-19 DIAGNOSIS — I129 Hypertensive chronic kidney disease with stage 1 through stage 4 chronic kidney disease, or unspecified chronic kidney disease: Secondary | ICD-10-CM | POA: Diagnosis not present

## 2019-04-19 DIAGNOSIS — M545 Low back pain: Secondary | ICD-10-CM | POA: Diagnosis not present

## 2019-04-19 DIAGNOSIS — N189 Chronic kidney disease, unspecified: Secondary | ICD-10-CM | POA: Diagnosis not present

## 2019-04-19 DIAGNOSIS — E782 Mixed hyperlipidemia: Secondary | ICD-10-CM | POA: Diagnosis not present

## 2019-04-19 DIAGNOSIS — G35 Multiple sclerosis: Secondary | ICD-10-CM | POA: Diagnosis not present

## 2019-04-19 DIAGNOSIS — N319 Neuromuscular dysfunction of bladder, unspecified: Secondary | ICD-10-CM | POA: Diagnosis not present

## 2019-04-19 DIAGNOSIS — E559 Vitamin D deficiency, unspecified: Secondary | ICD-10-CM | POA: Diagnosis not present

## 2019-04-19 DIAGNOSIS — H548 Legal blindness, as defined in USA: Secondary | ICD-10-CM | POA: Diagnosis not present

## 2019-04-19 DIAGNOSIS — G894 Chronic pain syndrome: Secondary | ICD-10-CM | POA: Diagnosis not present

## 2019-04-19 DIAGNOSIS — J432 Centrilobular emphysema: Secondary | ICD-10-CM | POA: Diagnosis not present

## 2019-04-21 DIAGNOSIS — N189 Chronic kidney disease, unspecified: Secondary | ICD-10-CM | POA: Diagnosis not present

## 2019-04-21 DIAGNOSIS — I129 Hypertensive chronic kidney disease with stage 1 through stage 4 chronic kidney disease, or unspecified chronic kidney disease: Secondary | ICD-10-CM | POA: Diagnosis not present

## 2019-04-21 DIAGNOSIS — K589 Irritable bowel syndrome without diarrhea: Secondary | ICD-10-CM | POA: Diagnosis not present

## 2019-04-21 DIAGNOSIS — M545 Low back pain: Secondary | ICD-10-CM | POA: Diagnosis not present

## 2019-04-21 DIAGNOSIS — H548 Legal blindness, as defined in USA: Secondary | ICD-10-CM | POA: Diagnosis not present

## 2019-04-21 DIAGNOSIS — D631 Anemia in chronic kidney disease: Secondary | ICD-10-CM | POA: Diagnosis not present

## 2019-04-21 DIAGNOSIS — G35 Multiple sclerosis: Secondary | ICD-10-CM | POA: Diagnosis not present

## 2019-04-21 DIAGNOSIS — G894 Chronic pain syndrome: Secondary | ICD-10-CM | POA: Diagnosis not present

## 2019-04-21 DIAGNOSIS — N319 Neuromuscular dysfunction of bladder, unspecified: Secondary | ICD-10-CM | POA: Diagnosis not present

## 2019-04-21 DIAGNOSIS — M8589 Other specified disorders of bone density and structure, multiple sites: Secondary | ICD-10-CM | POA: Diagnosis not present

## 2019-04-21 DIAGNOSIS — E782 Mixed hyperlipidemia: Secondary | ICD-10-CM | POA: Diagnosis not present

## 2019-04-21 DIAGNOSIS — J432 Centrilobular emphysema: Secondary | ICD-10-CM | POA: Diagnosis not present

## 2019-04-21 DIAGNOSIS — E559 Vitamin D deficiency, unspecified: Secondary | ICD-10-CM | POA: Diagnosis not present

## 2019-04-22 DIAGNOSIS — G35 Multiple sclerosis: Secondary | ICD-10-CM | POA: Diagnosis not present

## 2019-04-22 DIAGNOSIS — K589 Irritable bowel syndrome without diarrhea: Secondary | ICD-10-CM | POA: Diagnosis not present

## 2019-04-22 DIAGNOSIS — I129 Hypertensive chronic kidney disease with stage 1 through stage 4 chronic kidney disease, or unspecified chronic kidney disease: Secondary | ICD-10-CM | POA: Diagnosis not present

## 2019-04-22 DIAGNOSIS — M545 Low back pain: Secondary | ICD-10-CM | POA: Diagnosis not present

## 2019-04-22 DIAGNOSIS — J432 Centrilobular emphysema: Secondary | ICD-10-CM | POA: Diagnosis not present

## 2019-04-22 DIAGNOSIS — H548 Legal blindness, as defined in USA: Secondary | ICD-10-CM | POA: Diagnosis not present

## 2019-04-22 DIAGNOSIS — D631 Anemia in chronic kidney disease: Secondary | ICD-10-CM | POA: Diagnosis not present

## 2019-04-22 DIAGNOSIS — N319 Neuromuscular dysfunction of bladder, unspecified: Secondary | ICD-10-CM | POA: Diagnosis not present

## 2019-04-22 DIAGNOSIS — E782 Mixed hyperlipidemia: Secondary | ICD-10-CM | POA: Diagnosis not present

## 2019-04-22 DIAGNOSIS — G894 Chronic pain syndrome: Secondary | ICD-10-CM | POA: Diagnosis not present

## 2019-04-22 DIAGNOSIS — M8589 Other specified disorders of bone density and structure, multiple sites: Secondary | ICD-10-CM | POA: Diagnosis not present

## 2019-04-22 DIAGNOSIS — N189 Chronic kidney disease, unspecified: Secondary | ICD-10-CM | POA: Diagnosis not present

## 2019-04-22 DIAGNOSIS — E559 Vitamin D deficiency, unspecified: Secondary | ICD-10-CM | POA: Diagnosis not present

## 2019-04-26 DIAGNOSIS — M545 Low back pain: Secondary | ICD-10-CM | POA: Diagnosis not present

## 2019-04-26 DIAGNOSIS — K589 Irritable bowel syndrome without diarrhea: Secondary | ICD-10-CM | POA: Diagnosis not present

## 2019-04-26 DIAGNOSIS — H548 Legal blindness, as defined in USA: Secondary | ICD-10-CM | POA: Diagnosis not present

## 2019-04-26 DIAGNOSIS — M8589 Other specified disorders of bone density and structure, multiple sites: Secondary | ICD-10-CM | POA: Diagnosis not present

## 2019-04-26 DIAGNOSIS — J432 Centrilobular emphysema: Secondary | ICD-10-CM | POA: Diagnosis not present

## 2019-04-26 DIAGNOSIS — N319 Neuromuscular dysfunction of bladder, unspecified: Secondary | ICD-10-CM | POA: Diagnosis not present

## 2019-04-26 DIAGNOSIS — E782 Mixed hyperlipidemia: Secondary | ICD-10-CM | POA: Diagnosis not present

## 2019-04-26 DIAGNOSIS — E559 Vitamin D deficiency, unspecified: Secondary | ICD-10-CM | POA: Diagnosis not present

## 2019-04-26 DIAGNOSIS — N189 Chronic kidney disease, unspecified: Secondary | ICD-10-CM | POA: Diagnosis not present

## 2019-04-26 DIAGNOSIS — G35 Multiple sclerosis: Secondary | ICD-10-CM | POA: Diagnosis not present

## 2019-04-26 DIAGNOSIS — G894 Chronic pain syndrome: Secondary | ICD-10-CM | POA: Diagnosis not present

## 2019-04-26 DIAGNOSIS — D631 Anemia in chronic kidney disease: Secondary | ICD-10-CM | POA: Diagnosis not present

## 2019-04-26 DIAGNOSIS — I129 Hypertensive chronic kidney disease with stage 1 through stage 4 chronic kidney disease, or unspecified chronic kidney disease: Secondary | ICD-10-CM | POA: Diagnosis not present

## 2019-04-28 DIAGNOSIS — H548 Legal blindness, as defined in USA: Secondary | ICD-10-CM | POA: Diagnosis not present

## 2019-04-28 DIAGNOSIS — G894 Chronic pain syndrome: Secondary | ICD-10-CM | POA: Diagnosis not present

## 2019-04-28 DIAGNOSIS — M8589 Other specified disorders of bone density and structure, multiple sites: Secondary | ICD-10-CM | POA: Diagnosis not present

## 2019-04-28 DIAGNOSIS — E559 Vitamin D deficiency, unspecified: Secondary | ICD-10-CM | POA: Diagnosis not present

## 2019-04-28 DIAGNOSIS — M545 Low back pain: Secondary | ICD-10-CM | POA: Diagnosis not present

## 2019-04-28 DIAGNOSIS — I129 Hypertensive chronic kidney disease with stage 1 through stage 4 chronic kidney disease, or unspecified chronic kidney disease: Secondary | ICD-10-CM | POA: Diagnosis not present

## 2019-04-28 DIAGNOSIS — K589 Irritable bowel syndrome without diarrhea: Secondary | ICD-10-CM | POA: Diagnosis not present

## 2019-04-28 DIAGNOSIS — N189 Chronic kidney disease, unspecified: Secondary | ICD-10-CM | POA: Diagnosis not present

## 2019-04-28 DIAGNOSIS — E782 Mixed hyperlipidemia: Secondary | ICD-10-CM | POA: Diagnosis not present

## 2019-04-28 DIAGNOSIS — D631 Anemia in chronic kidney disease: Secondary | ICD-10-CM | POA: Diagnosis not present

## 2019-04-28 DIAGNOSIS — G35 Multiple sclerosis: Secondary | ICD-10-CM | POA: Diagnosis not present

## 2019-04-28 DIAGNOSIS — J432 Centrilobular emphysema: Secondary | ICD-10-CM | POA: Diagnosis not present

## 2019-04-28 DIAGNOSIS — N319 Neuromuscular dysfunction of bladder, unspecified: Secondary | ICD-10-CM | POA: Diagnosis not present

## 2019-05-03 DIAGNOSIS — G35 Multiple sclerosis: Secondary | ICD-10-CM | POA: Diagnosis not present

## 2019-05-03 DIAGNOSIS — H548 Legal blindness, as defined in USA: Secondary | ICD-10-CM | POA: Diagnosis not present

## 2019-05-03 DIAGNOSIS — N189 Chronic kidney disease, unspecified: Secondary | ICD-10-CM | POA: Diagnosis not present

## 2019-05-03 DIAGNOSIS — I129 Hypertensive chronic kidney disease with stage 1 through stage 4 chronic kidney disease, or unspecified chronic kidney disease: Secondary | ICD-10-CM | POA: Diagnosis not present

## 2019-05-03 DIAGNOSIS — M8589 Other specified disorders of bone density and structure, multiple sites: Secondary | ICD-10-CM | POA: Diagnosis not present

## 2019-05-03 DIAGNOSIS — D631 Anemia in chronic kidney disease: Secondary | ICD-10-CM | POA: Diagnosis not present

## 2019-05-03 DIAGNOSIS — J432 Centrilobular emphysema: Secondary | ICD-10-CM | POA: Diagnosis not present

## 2019-05-03 DIAGNOSIS — E782 Mixed hyperlipidemia: Secondary | ICD-10-CM | POA: Diagnosis not present

## 2019-05-03 DIAGNOSIS — N319 Neuromuscular dysfunction of bladder, unspecified: Secondary | ICD-10-CM | POA: Diagnosis not present

## 2019-05-03 DIAGNOSIS — K589 Irritable bowel syndrome without diarrhea: Secondary | ICD-10-CM | POA: Diagnosis not present

## 2019-05-03 DIAGNOSIS — G894 Chronic pain syndrome: Secondary | ICD-10-CM | POA: Diagnosis not present

## 2019-05-03 DIAGNOSIS — E559 Vitamin D deficiency, unspecified: Secondary | ICD-10-CM | POA: Diagnosis not present

## 2019-05-03 DIAGNOSIS — M545 Low back pain: Secondary | ICD-10-CM | POA: Diagnosis not present

## 2019-05-14 DIAGNOSIS — E559 Vitamin D deficiency, unspecified: Secondary | ICD-10-CM | POA: Diagnosis not present

## 2019-05-14 DIAGNOSIS — M8589 Other specified disorders of bone density and structure, multiple sites: Secondary | ICD-10-CM | POA: Diagnosis not present

## 2019-05-14 DIAGNOSIS — E782 Mixed hyperlipidemia: Secondary | ICD-10-CM | POA: Diagnosis not present

## 2019-05-14 DIAGNOSIS — N189 Chronic kidney disease, unspecified: Secondary | ICD-10-CM | POA: Diagnosis not present

## 2019-05-14 DIAGNOSIS — H548 Legal blindness, as defined in USA: Secondary | ICD-10-CM | POA: Diagnosis not present

## 2019-05-14 DIAGNOSIS — J432 Centrilobular emphysema: Secondary | ICD-10-CM | POA: Diagnosis not present

## 2019-05-14 DIAGNOSIS — N319 Neuromuscular dysfunction of bladder, unspecified: Secondary | ICD-10-CM | POA: Diagnosis not present

## 2019-05-14 DIAGNOSIS — G35 Multiple sclerosis: Secondary | ICD-10-CM | POA: Diagnosis not present

## 2019-05-14 DIAGNOSIS — K589 Irritable bowel syndrome without diarrhea: Secondary | ICD-10-CM | POA: Diagnosis not present

## 2019-05-14 DIAGNOSIS — D631 Anemia in chronic kidney disease: Secondary | ICD-10-CM | POA: Diagnosis not present

## 2019-05-14 DIAGNOSIS — I129 Hypertensive chronic kidney disease with stage 1 through stage 4 chronic kidney disease, or unspecified chronic kidney disease: Secondary | ICD-10-CM | POA: Diagnosis not present

## 2019-05-14 DIAGNOSIS — M545 Low back pain: Secondary | ICD-10-CM | POA: Diagnosis not present

## 2019-05-14 DIAGNOSIS — G894 Chronic pain syndrome: Secondary | ICD-10-CM | POA: Diagnosis not present

## 2019-05-18 ENCOUNTER — Ambulatory Visit: Payer: Medicare Other

## 2019-05-19 IMAGING — CT CT CERVICAL SPINE W/O CM
4 of 7 series · 14 of 33 positions shown, 15 images · non-contrast
Comparison: MRI head September 29, 2017

CLINICAL DATA: Fall from bed today, struck head. History of
multiple sclerosis.

EXAM:
CT HEAD WITHOUT CONTRAST
CT CERVICAL SPINE WITHOUT CONTRAST
TECHNIQUE: Multidetector CT imaging of the head and cervical spine was
performed following the standard protocol without intravenous
contrast. Multiplanar CT image reconstructions of the cervical spine
were also generated.

[Series 4: coronal soft tissue · coronal · 0.29mm/px · 2 of 63 slices shown]
[im 21/63  bone]
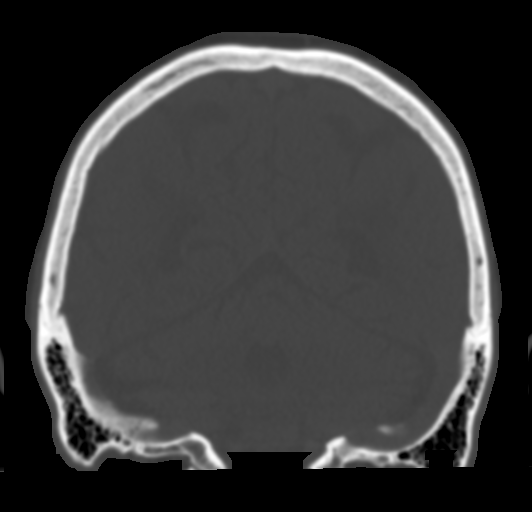
[im 42/63  bone]
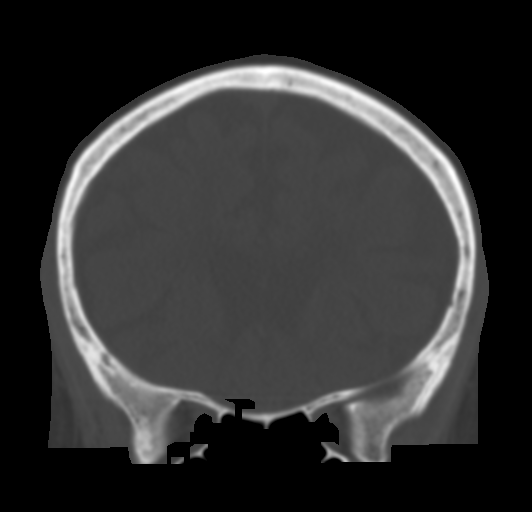

[Series 7: c spine soft · axial · 0.23mm/px · z∈[-290,-194]mm · 4 of 81 slices shown]
[im 17/81  soft-tissue]
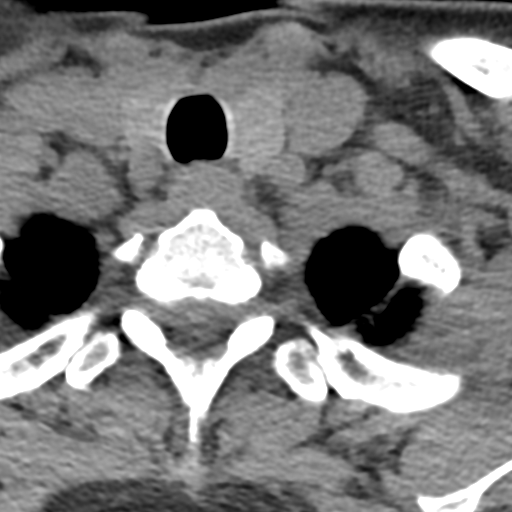
[im 33/81  soft-tissue]
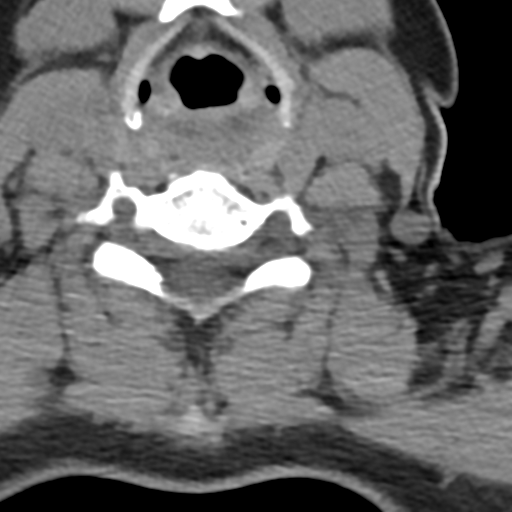
[im 49/81  soft-tissue]
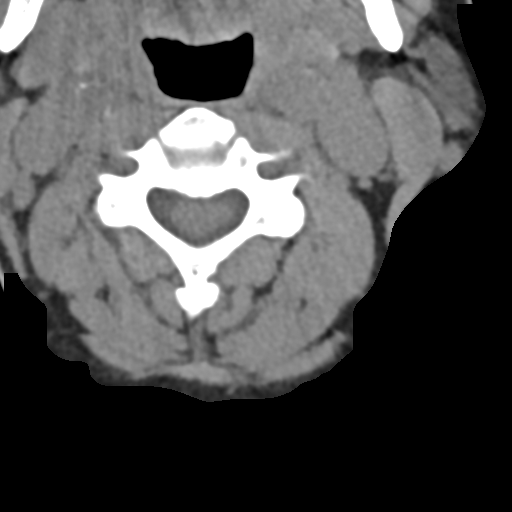
[im 65/81  soft-tissue]
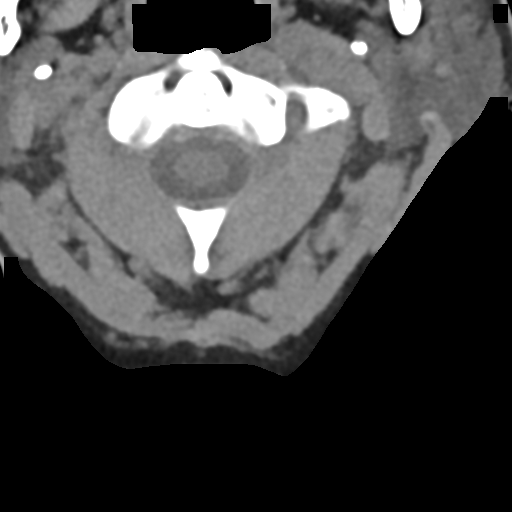

[Series 8: sagittal bone · sagittal · 0.21mm/px · 4 of 43 slices shown]
[im 9/43  bone]
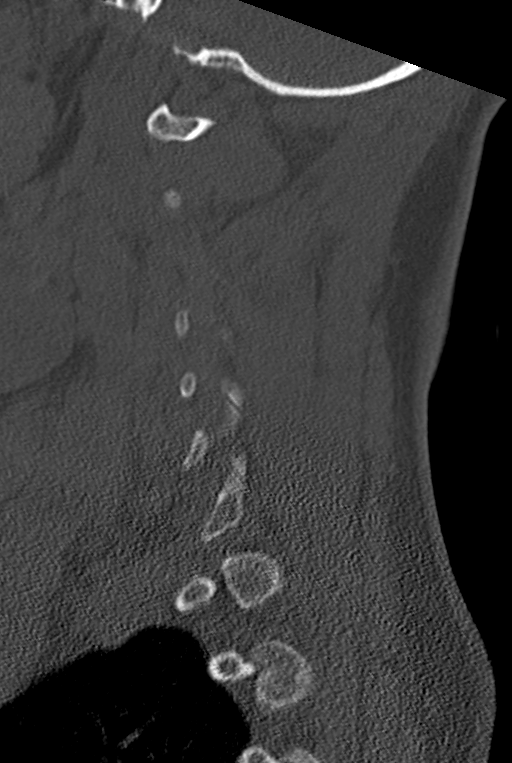
[im 17/43  bone]
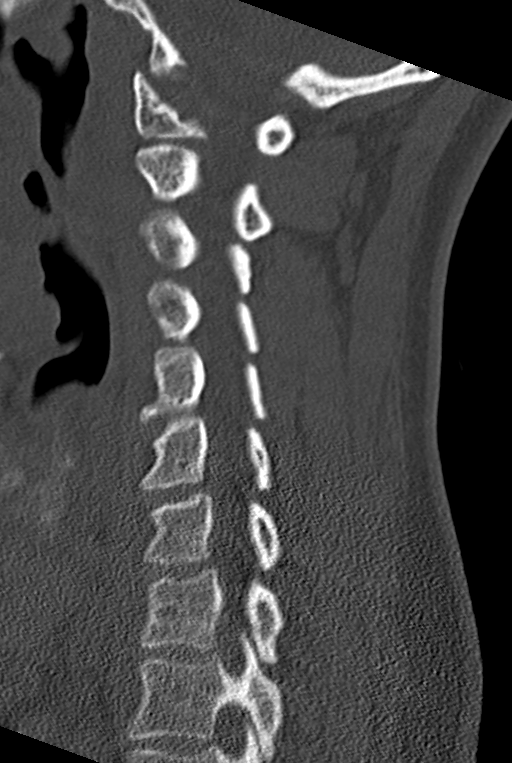
[im 26/43  bone]
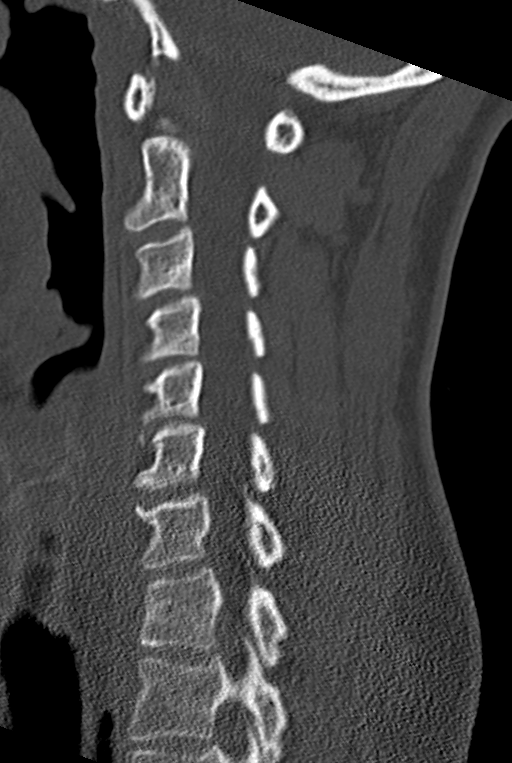
[im 34/43  bone]
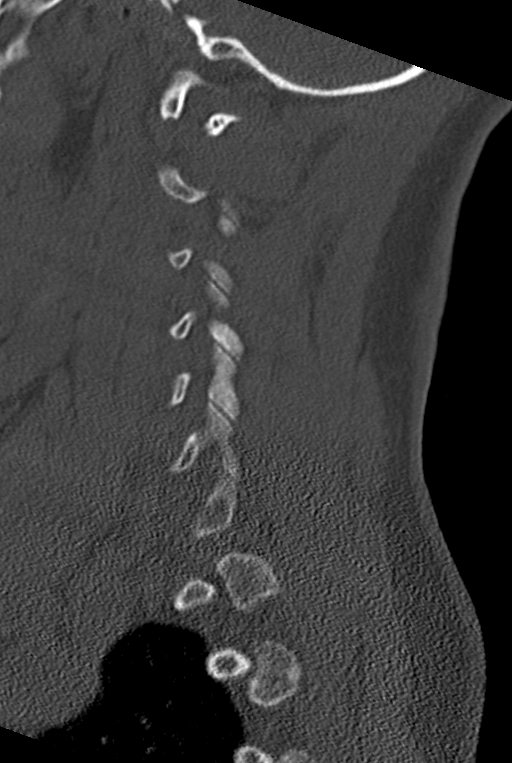

[Series 10: orthogonal bone · axial · 0.20mm/px · z∈[-312,-213]mm · 4 of 89 slices shown, 5 images]
[im 18/89  soft-tissue]
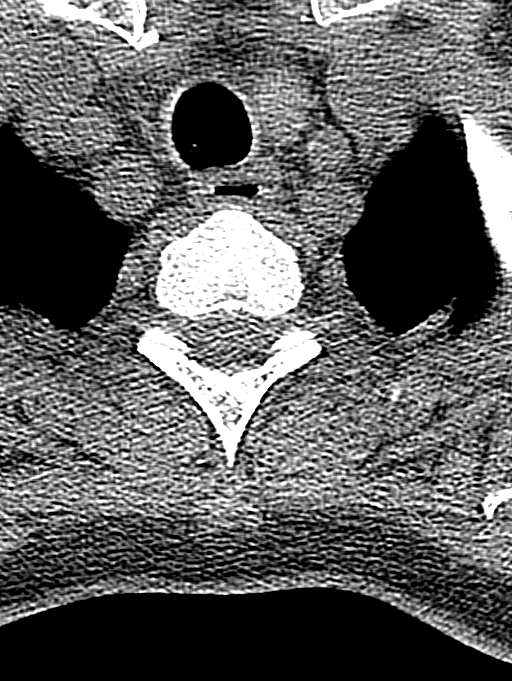
[im 18/89  bone]
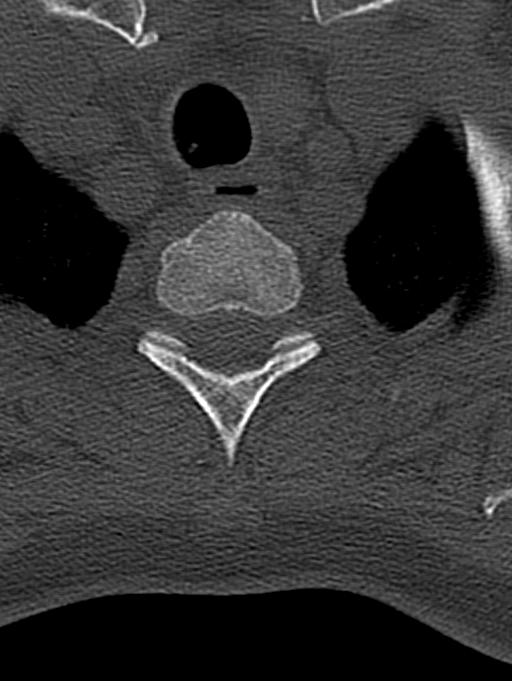
[im 36/89  bone]
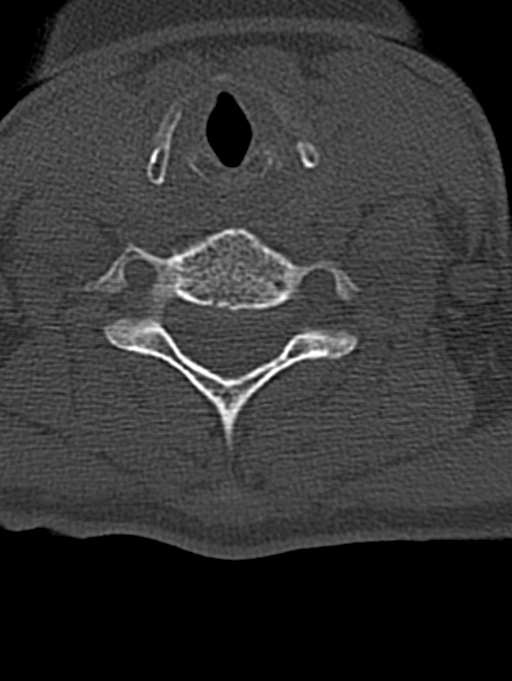
[im 53/89  bone]
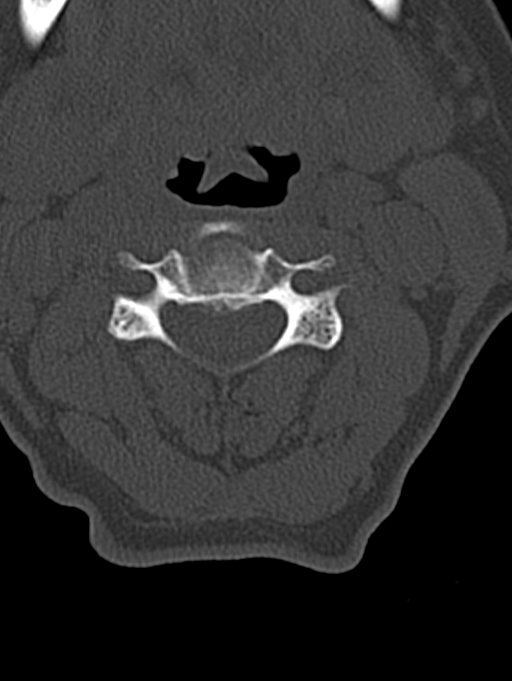
[im 71/89  bone]
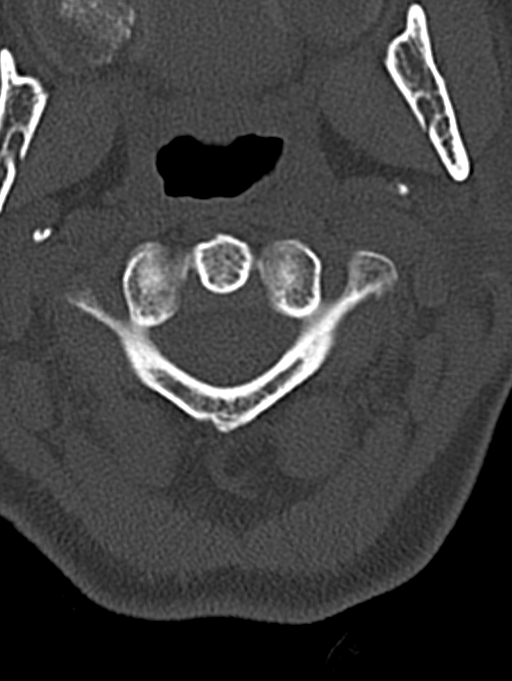

[14 of 33 positions shown; findings below may reference images not displayed]

FINDINGS: CT HEAD FINDINGS

BRAIN: No intraparenchymal hemorrhage, mass effect nor midline
shift. No acute large vascular territory infarct. Small area chronic
RIGHT mesial frontal lobe encephalomalacia. Confluent supratentorial
white matter hypodensities. Mild parenchymal brain volume loss. No
hydrocephalus. No abnormal extra-axial fluid collections.

VASCULAR: Unremarkable.

SKULL/SOFT TISSUES: No skull fracture. No significant soft tissue
swelling.

ORBITS/SINUSES: The included ocular globes and orbital contents are
normal.Trace paranasal sinus mucosal thickening. Mastoid air cells
are well aerated.

OTHER: None.

CT CERVICAL SPINE FINDINGS

ALIGNMENT: Straightened lordosis.  Vertebral bodies in alignment.

SKULL BASE AND VERTEBRAE: Cervical vertebral bodies and posterior
elements are intact. Intervertebral disc heights preserved. No
destructive bony lesions. C1-2 articulation maintained.

SOFT TISSUES AND SPINAL CANAL: Nonacute. Mild calcific
atherosclerosis of included arch vessel origins.

DISC LEVELS: Multilevel small disc protrusions resulting in mild
suspected canal stenosis. No osseous neural foraminal narrowing.

UPPER CHEST: Lung apices are clear.

OTHER: None.
IMPRESSION: CT HEAD:

1. No acute intracranial process.
2. Severe white matter changes consistent with known demyelination.
3. Old small RIGHT ACA territory infarct versus chronic focal
demyelination.

CT CERVICAL SPINE:

1. No fracture or malalignment.
2. Multilevel small disc protrusions.

## 2019-05-25 ENCOUNTER — Ambulatory Visit (INDEPENDENT_AMBULATORY_CARE_PROVIDER_SITE_OTHER): Payer: Medicare Other

## 2019-05-25 ENCOUNTER — Telehealth: Payer: Self-pay | Admitting: Family Medicine

## 2019-05-25 DIAGNOSIS — G894 Chronic pain syndrome: Secondary | ICD-10-CM

## 2019-05-25 DIAGNOSIS — Z0000 Encounter for general adult medical examination without abnormal findings: Secondary | ICD-10-CM

## 2019-05-25 DIAGNOSIS — G35 Multiple sclerosis: Secondary | ICD-10-CM

## 2019-05-25 MED ORDER — TRAMADOL HCL 50 MG PO TABS: 50.0000 mg | ORAL_TABLET | Freq: Every evening | ORAL | 2 refills | Status: DC | PRN

## 2019-05-25 NOTE — Telephone Encounter (Signed)
Patient seen by Tyler Aas LPN today 47/4 for medicare wellness  She needs nebulizer tubing. She has machine.  Do we have any spare nebulizer tubing perhaps?  Or  Would you be able to contact her [place] to see if they can assist her with tubing? We can fax an order if they need it.  I am not sure what company she used to get the nebulizer.  Also I refilled Tramadol  Nobie Putnam, [facility] 05/25/2019, 12:41 PM

## 2019-05-25 NOTE — Progress Notes (Signed)
Subjective:   [name] is a 63 y.o. female who presents for Medicare Annual (Subsequent) preventive examination.  This visit is being conducted via phone call  - after an attmept to do on video chat - due to the COVID-19 pandemic. This patient has given me verbal consent via phone to conduct this visit, patient states they are participating from their home address. Some vital signs may be absent or patient reported.   Patient identification: identified by name, DOB, and current address.   Mariann Laster- aid assisted during visit    Review of Systems:   Cardiac Risk Factors include: advanced age (>26men, >79 women);dyslipidemia;hypertension;smoking/ tobacco exposure     Objective:     Vitals: There were no vitals taken for this visit.  There is no height or weight on file to calculate BMI.  Advanced Directives 05/25/2019 05/12/2018 04/13/2018 01/07/2018 10/28/2017  Does Patient Have a Medical Advance Directive? No Yes No Yes Yes  Type of Advance Directive - [place];Living will - [place];Living will [place];Living will  Does patient want to make changes to medical advance directive? - - - - No - Patient declined  Copy of [place] in Chart? - - - - No - copy requested    Tobacco Social History   Tobacco Use  Smoking Status Current Every Day Smoker  . Packs/day: 1.00  . Years: 20.00  . Pack years: 20.00  . Types: Cigars  . Last attempt to quit: 01/03/1995  . Years since quitting: 24.4  Smokeless Tobacco Never Used  Tobacco Comment   black and mild      Ready to quit: No Counseling given: Yes Comment: black and mild    Clinical Intake:  Pre-visit preparation completed: Yes  Pain : No/denies pain Pain Type: Chronic pain Pain Location: Leg Pain Orientation: Left Pain Descriptors / Indicators: Aching Pain Onset: More than a month ago Pain Frequency: Constant     Nutritional Risks: None  Diabetes: No  How often do you need to have someone help you when you read instructions, pamphlets, or other written materials from your doctor or pharmacy?: 1 - Never  Interpreter Needed?: No  Information entered by ::  ,LPN  Past Medical History:  Diagnosis Date  . Headache   . Hyperlipidemia   . MS (multiple sclerosis) ([place]) 1990  . Neurogenic bladder    Past Surgical History:  Procedure Laterality Date  . ABDOMINAL HYSTERECTOMY    . OOPHORECTOMY Bilateral    Family History  Problem Relation Age of Onset  . Diabetes Mother   . Heart disease Mother   . Diabetes Father   . Heart disease Father   . Diabetes Sister   . Heart disease Sister    Social History   Socioeconomic History  . Marital status: Divorced    Spouse name: Not on file  . Number of children: 1  . Years of education: Not on file  . Highest education level: Some college, no degree  Occupational History  . Occupation: retired  Scientific laboratory technician  . Financial resource strain: Somewhat hard  . Food insecurity    Worry: Sometimes true    Inability: Sometimes true  . Transportation needs    Medical: No    Non-medical: No  Tobacco Use  . Smoking status: Current Every Day Smoker    Packs/day: 1.00    Years: 20.00    Pack years: 20.00    Types: Cigars  Last attempt to quit: 01/03/1995    Years since quitting: 24.4  . Smokeless tobacco: Never Used  . Tobacco comment: black and mild   Substance and Sexual Activity  . Alcohol use: No    Alcohol/week: 0.0 standard drinks  . Drug use: Yes    Comment: smokes pods in past  . Sexual activity: Not on file  Lifestyle  . Physical activity    Days per week: 0 days    Minutes per session: 0 min  . Stress: Not at all  Relationships  . Social Musician on phone: Patient refused    Gets together: Patient refused    Attends religious service: Patient refused    Active member of club or organization: Patient refused    Attends meetings  of clubs or organizations: Patient refused    Relationship status: Patient refused  Other Topics Concern  . Not on file  Social History Narrative  . Not on file    Outpatient Encounter Medications as of 05/25/2019  Medication Sig  . albuterol (PROVENTIL) (2.5 MG/3ML) 0.083% nebulizer solution Take 3 mLs (2.5 mg total) by nebulization every 6 (six) hours as needed for wheezing or shortness of breath.  [name] Kitchen albuterol (VENTOLIN HFA) 108 (90 Base) MCG/ACT inhaler USE 2 PUFFS EVERY SIX HOURS AS NEEDED FOR WHEEZING OR SHORTNESS OF BREATH.  [name] Kitchen alendronate (FOSAMAX) 70 MG tablet TAKE 1 TABLET BY MOUTH EVERY 7 (SEVEN) DAYS. TAKE WITH A FULL GLASS OF WATER ON AN EMPTY STOMACH.  . baclofen (LIORESAL) 10 MG tablet TAKE 1 TABLET BY MOUTH AT BEDTIME AS NEEDED FOR MUSCLE SPASMS  . dalfampridine (AMPYRA) 10 MG TB12 Take 10 mg by mouth 2 (two) times daily.  . Dimethyl Fumarate (TECFIDERA) 240 MG CPDR Take 240 mg by mouth 2 (two) times daily.  [name] Kitchen gabapentin (NEURONTIN) 300 MG capsule TAKE 2 CAPSULES (600 MG TOTAL) BY MOUTH 3 (THREE) TIMES DAILY.  . Misc. Devices (CANE) MISC 1 each by Does not apply route as needed.  [name] Kitchen MYRBETRIQ 50 MG TB24 tablet TAKE 1 TABLET BY MOUTH EVERY DAY  . pantoprazole (PROTONIX) 40 MG tablet TAKE 1 TABLET BY MOUTH DAILY BEFORE BREAKFAST  . pravastatin (PRAVACHOL) 40 MG tablet TAKE 1 TABLET BY MOUTH EVERY DAY  . SPIRIVA HANDIHALER 18 MCG inhalation capsule INHALE 1 CAPSULE VIA HANDIHALER ONCE DAILY AT THE SAME TIME EVERY DAY  . traMADol (ULTRAM) 50 MG tablet Take 1 tablet (50 mg total) by mouth at bedtime as needed for moderate pain. (Patient not taking: Reported on 05/25/2019)   No facility-administered encounter medications on file as of 05/25/2019.     Activities of Daily Living In your present state of health, do you have any difficulty performing the following activities: 05/25/2019  Hearing? N  Comment no hearing aids  Vision? Y  Comment eyeglasses, [facility]   Difficulty concentrating or making decisions? N  Walking or climbing stairs? Y  Dressing or bathing? N  Doing errands, shopping? Y  Comment aid Insurance claims handler and eating ? N  Using the Toilet? N  In the past six months, have you accidently leaked urine? Y  Comment depends  Do you have problems with loss of bowel control? N  Managing your Medications? Y  Comment aid helps  Managing your Finances? Y  Comment aid helps  Housekeeping or managing your Housekeeping? Y  Comment aid helps  Some recent data might be hidden    Patient Care Team: [name]  J, DO as PCP - General (Family Medicine) Morene Crocker, MD as Referring Physician (Neurology) Minor, Theadora Rama, RN as Case Manager    Assessment:   This is a routine wellness examination for Robert.  Exercise Activities and Dietary recommendations Current Exercise Habits: Home exercise routine(therapy exercises), Type of exercise: walking, Time (Minutes): 10, Frequency (Times/Week): 7, Weekly Exercise (Minutes/Week): 70, Intensity: Mild, Exercise limited by: None identified  Goals    . DIET - INCREASE WATER INTAKE     Recommend to drink at least 6-8 8oz glasses of water per day.        Fall Risk: Fall Risk  05/25/2019 03/18/2019 08/19/2018 05/12/2018 05/29/2015  Falls in the past year? 0 0 0 0 No  Number falls in past yr: 0 - - - -  Injury with Fall? 0 - - - -  Follow up - Falls evaluation completed Falls evaluation completed - -    FALL RISK PREVENTION PERTAINING TO THE HOME:  Any stairs in or around the home? No  If so, are there any without handrails? No   Home free of loose throw rugs in walkways, pet beds, electrical cords, etc? Yes  Adequate lighting in your home to reduce risk of falls? Yes   ASSISTIVE DEVICES UTILIZED TO PREVENT FALLS:  Life alert? Yes  Use of a cane, walker or w/c? Yes  walker  Grab bars in the bathroom? Yes  Shower chair or bench in shower? Yes  Elevated toilet seat or  a handicapped toilet? Yes   DME ORDERS:  DME order needed?  No   TIMED UP AND GO:  Unable to perform    Depression Screen PHQ 2/9 Scores 05/25/2019 03/18/2019 08/19/2018 05/12/2018  PHQ - 2 Score 0 0 0 1  PHQ- 9 Score - - 0 -     Cognitive Function        Immunization History  Administered Date(s) Administered  . Influenza, High Dose Seasonal PF 08/19/2018  . Influenza,inj,Quad PF,6+ Mos 03/04/2016, 02/25/2017  . Tdap 07/23/2016    Qualifies for Shingles Vaccine? Yes  Zostavax completed n/a. Due for Shingrix. Education has been provided regarding the importance of this vaccine. Pt has been advised to call insurance company to determine out of pocket expense. Advised may also receive vaccine at local pharmacy or Health Dept. Verbalized acceptance and understanding.  Tdap: up to date  Flu Vaccine: Due for Flu vaccine.   Pneumococcal Vaccine: not indicated   Screening Tests Health Maintenance  Topic Date Due  . MAMMOGRAM  02/01/2007  . Fecal DNA (Cologuard)  02/01/2007  . INFLUENZA VACCINE  01/23/2019  . TETANUS/TDAP  07/23/2026  . Hepatitis C Screening  Completed  . HIV Screening  Completed    Cancer Screenings:  Colorectal Screening: Cologuard ordered   Mammogram: declined currently  Bone Density: not indicated .  Lung Cancer Screening: (Low Dose CT Chest recommended if Age 56-80 years, 30 pack-year currently smoking OR have quit w/in 15years.) does qualify.    Additional Screening:  Hepatitis C Screening: does qualify; Completed 02/12/2017  Vision Screening: Recommended annual ophthalmology exams for early detection of glaucoma and other disorders of the eye. Is the patient up to date with their annual eye exam?  Yes  Who is the provider or what is the name of the office in which the pt attends annual eye exams? [facility]    Dental Screening: Recommended annual dental exams for proper oral hygiene  Community Resource Referral:  CRR  required this visit?  Yes , needs information on will and POA. Information on magnifying glass through [employer].     Plan:  I have personally reviewed and addressed the Medicare Annual Wellness questionnaire and have noted the following in the patient's chart:  A. Medical and social history B. Use of alcohol, tobacco or illicit drugs  C. Current medications and supplements D. Functional ability and status E.  Nutritional status F.  Physical activity G. Advance directives H. List of other physicians I.  Hospitalizations, surgeries, and ER visits in previous 12 months J.  Vitals K. Screenings such as hearing and vision if needed, cognitive and depression L. Referrals and appointments   In addition, I have reviewed and discussed with patient certain preventive protocols, quality metrics, and best practice recommendations. A written personalized care plan for preventive services as well as general preventive health recommendations were provided to patient.  Signed,    Collene SchlichterHill,  A, LPN  16/1/096012/06/2018 Nurse Health Advisor   Nurse Notes: needs tubing for nebulizer. Has machine but no tubing.  Requesting refill on tramadol.

## 2019-05-25 NOTE — Telephone Encounter (Signed)
I called and spoke with the patient aid and informed her that we can give her a starter kit for her to use until we get the order submitted to [facility] for nebulizer tubing. She said that the patient is coming Thursday for a flu shot and can get the nebulizer tubing then.

## 2019-05-25 NOTE — Patient Instructions (Signed)
[name] , Thank you for taking time to come for your Medicare Wellness Visit. I appreciate your ongoing commitment to your health goals. Please review the following plan we discussed and let me know if I can assist you in the future.   Screening recommendations/referrals: Colonoscopy: cologuard ordered Mammogram: declined Bone Density: not indicated  Recommended yearly ophthalmology/optometry visit for glaucoma screening and checkup Recommended yearly dental visit for hygiene and checkup  Vaccinations: Influenza vaccine: due now  Pneumococcal vaccine: not indicated  Tdap vaccine: up to date Shingles vaccine: shingrix eligible     Advanced directives: will mail information   Conditions/risks identified: fall prevention discussed   Next appointment: follow up in one year for your annual wellness visit   Preventive Care 40-64 Years, Female Preventive care refers to lifestyle choices and visits with your health care provider that can promote health and wellness. What does preventive care include?  A yearly physical exam. This is also called an annual well check.  Dental exams once or twice a year.  Routine eye exams. Ask your health care provider how often you should have your eyes checked.  Personal lifestyle choices, including:  Daily care of your teeth and gums.  Regular physical activity.  Eating a healthy diet.  Avoiding tobacco and drug use.  Limiting alcohol use.  Practicing safe sex.  Taking low-dose aspirin daily starting at age 45.  Taking vitamin and mineral supplements as recommended by your health care provider. What happens during an annual well check? The services and screenings done by your health care provider during your annual well check will depend on your age, overall health, lifestyle risk factors, and family history of disease. Counseling  Your health care provider may ask you questions about your:  Alcohol use.  Tobacco use.  Drug use.   Emotional well-being.  Home and relationship well-being.  Sexual activity.  Eating habits.  Work and work Statistician.  Method of birth control.  Menstrual cycle.  Pregnancy history. Screening  You may have the following tests or measurements:  Height, weight, and BMI.  Blood pressure.  Lipid and cholesterol levels. These may be checked every 5 years, or more frequently if you are over 69 years old.  Skin check.  Lung cancer screening. You may have this screening every year starting at age 87 if you have a 30-pack-year history of smoking and currently smoke or have quit within the past 15 years.  Fecal occult blood test (FOBT) of the stool. You may have this test every year starting at age 84.  Flexible sigmoidoscopy or colonoscopy. You may have a sigmoidoscopy every 5 years or a colonoscopy every 10 years starting at age 79.  Hepatitis C blood test.  Hepatitis B blood test.  Sexually transmitted disease (STD) testing.  Diabetes screening. This is done by checking your blood sugar (glucose) after you have not eaten for a while (fasting). You may have this done every 1-3 years.  Mammogram. This may be done every 1-2 years. Talk to your health care provider about when you should start having regular mammograms. This may depend on whether you have a family history of breast cancer.  BRCA-related cancer screening. This may be done if you have a family history of breast, ovarian, tubal, or peritoneal cancers.  Pelvic exam and Pap test. This may be done every 3 years starting at age 47. Starting at age 23, this may be done every 5 years if you have a Pap test in combination with  an HPV test.  Bone density scan. This is done to screen for osteoporosis. You may have this scan if you are at high risk for osteoporosis. Discuss your test results, treatment options, and if necessary, the need for more tests with your health care provider. Vaccines  Your health care provider may  recommend certain vaccines, such as:  Influenza vaccine. This is recommended every year.  Tetanus, diphtheria, and acellular pertussis (Tdap, Td) vaccine. You may need a Td booster every 10 years.  Zoster vaccine. You may need this after age 12.  Pneumococcal 13-valent conjugate (PCV13) vaccine. You may need this if you have certain conditions and were not previously vaccinated.  Pneumococcal polysaccharide (PPSV23) vaccine. You may need one or two doses if you smoke cigarettes or if you have certain conditions. Talk to your health care provider about which screenings and vaccines you need and how often you need them. This information is not intended to replace advice given to you by your health care provider. Make sure you discuss any questions you have with your health care provider. Document Released: 07/07/2015 Document Revised: 02/28/2016 Document Reviewed: 04/11/2015 Elsevier Interactive Patient Education  2017 [place] Prevention in the Home Falls can cause injuries. They can happen to people of all ages. There are many things you can do to make your home safe and to help prevent falls. What can I do on the outside of my home?  Regularly fix the edges of walkways and driveways and fix any cracks.  Remove anything that might make you trip as you walk through a door, such as a raised step or threshold.  Trim any bushes or trees on the path to your home.  Use bright outdoor lighting.  Clear any walking paths of anything that might make someone trip, such as rocks or tools.  Regularly check to see if handrails are loose or broken. Make sure that both sides of any steps have handrails.  Any raised decks and porches should have guardrails on the edges.  Have any leaves, snow, or ice cleared regularly.  Use sand or salt on walking paths during winter.  Clean up any spills in your garage right away. This includes oil or grease spills. What can I do in the  bathroom?  Use night lights.  Install grab bars by the toilet and in the tub and shower. Do not use towel bars as grab bars.  Use non-skid mats or decals in the tub or shower.  If you need to sit down in the shower, use a plastic, non-slip stool.  Keep the floor dry. Clean up any water that spills on the floor as soon as it happens.  Remove soap buildup in the tub or shower regularly.  Attach bath mats securely with double-sided non-slip rug tape.  Do not have throw rugs and other things on the floor that can make you trip. What can I do in the bedroom?  Use night lights.  Make sure that you have a light by your bed that is easy to reach.  Do not use any sheets or blankets that are too big for your bed. They should not hang down onto the floor.  Have a firm chair that has side arms. You can use this for support while you get dressed.  Do not have throw rugs and other things on the floor that can make you trip. What can I do in the kitchen?  Clean up any spills right away.  Avoid walking on wet floors.  Keep items that you use a lot in easy-to-reach places.  If you need to reach something above you, use a strong step stool that has a grab bar.  Keep electrical cords out of the way.  Do not use floor polish or wax that makes floors slippery. If you must use wax, use non-skid floor wax.  Do not have throw rugs and other things on the floor that can make you trip. What can I do with my stairs?  Do not leave any items on the stairs.  Make sure that there are handrails on both sides of the stairs and use them. Fix handrails that are broken or loose. Make sure that handrails are as Spielmann as the stairways.  Check any carpeting to make sure that it is firmly attached to the stairs. Fix any carpet that is loose or worn.  Avoid having throw rugs at the top or bottom of the stairs. If you do have throw rugs, attach them to the floor with carpet tape.  Make sure that you have a  light switch at the top of the stairs and the bottom of the stairs. If you do not have them, ask someone to add them for you. What else can I do to help prevent falls?  Wear shoes that:  Do not have high heels.  Have rubber bottoms.  Are comfortable and fit you well.  Are closed at the toe. Do not wear sandals.  If you use a stepladder:  Make sure that it is fully opened. Do not climb a closed stepladder.  Make sure that both sides of the stepladder are locked into place.  Ask someone to hold it for you, if possible.  Clearly mark and make sure that you can see:  Any grab bars or handrails.  First and last steps.  Where the edge of each step is.  Use tools that help you move around (mobility aids) if they are needed. These include:  Canes.  Walkers.  Scooters.  Crutches.  Turn on the lights when you go into a dark area. Replace any light bulbs as soon as they burn out.  Set up your furniture so you have a clear path. Avoid moving your furniture around.  If any of your floors are uneven, fix them.  If there are any pets around you, be aware of where they are.  Review your medicines with your doctor. Some medicines can make you feel dizzy. This can increase your chance of falling. Ask your doctor what other things that you can do to help prevent falls. This information is not intended to replace advice given to you by your health care provider. Make sure you discuss any questions you have with your health care provider. Document Released: 04/06/2009 Document Revised: 11/16/2015 Document Reviewed: 07/15/2014 Elsevier Interactive Patient Education  2017 [employer].

## 2019-05-27 ENCOUNTER — Other Ambulatory Visit: Payer: Self-pay

## 2019-05-27 ENCOUNTER — Ambulatory Visit: Payer: Medicare Other

## 2019-06-12 ENCOUNTER — Other Ambulatory Visit: Payer: Self-pay | Admitting: Family Medicine

## 2019-06-12 DIAGNOSIS — J432 Centrilobular emphysema: Secondary | ICD-10-CM

## 2019-07-13 ENCOUNTER — Other Ambulatory Visit: Payer: Self-pay | Admitting: Family Medicine

## 2019-07-13 DIAGNOSIS — N319 Neuromuscular dysfunction of bladder, unspecified: Secondary | ICD-10-CM

## 2019-07-13 DIAGNOSIS — R32 Unspecified urinary incontinence: Secondary | ICD-10-CM

## 2019-07-14 ENCOUNTER — Other Ambulatory Visit: Payer: Self-pay | Admitting: Family Medicine

## 2019-07-14 DIAGNOSIS — K219 Gastro-esophageal reflux disease without esophagitis: Secondary | ICD-10-CM

## 2019-07-15 ENCOUNTER — Other Ambulatory Visit: Payer: Self-pay | Admitting: Family Medicine

## 2019-07-15 DIAGNOSIS — E782 Mixed hyperlipidemia: Secondary | ICD-10-CM

## 2019-07-20 ENCOUNTER — Other Ambulatory Visit: Payer: Self-pay | Admitting: Family Medicine

## 2019-07-20 DIAGNOSIS — G35 Multiple sclerosis: Secondary | ICD-10-CM

## 2019-07-20 DIAGNOSIS — G894 Chronic pain syndrome: Secondary | ICD-10-CM

## 2019-09-06 ENCOUNTER — Telehealth: Payer: Self-pay | Admitting: Family Medicine

## 2019-09-06 NOTE — Telephone Encounter (Signed)
   09/06/2019  Name: [name]   MRN: [id number]   DOB: May 30, 1956   AGE: 65 y.o.   GENDER: female   PCP Smitty Cords, DO.   Called pt's caregiver [name] regarding [employer] for Power of [place] and Will for [name]. She stated that she does have a letter with information but she has been on medical leave and has not been working with [name]. She said she will go by the house and make sure she has it and will c/b if she has any further questions.  Manuela Schwartz  Care Guide . Embedded Care Coordination [facility] [email or web address] [email or web address]  [phone]

## 2019-09-14 ENCOUNTER — Other Ambulatory Visit: Payer: Self-pay | Admitting: Family Medicine

## 2019-09-14 DIAGNOSIS — J432 Centrilobular emphysema: Secondary | ICD-10-CM

## 2020-01-12 ENCOUNTER — Other Ambulatory Visit: Payer: Self-pay | Admitting: Family Medicine

## 2020-01-12 DIAGNOSIS — E782 Mixed hyperlipidemia: Secondary | ICD-10-CM

## 2020-01-12 DIAGNOSIS — N319 Neuromuscular dysfunction of bladder, unspecified: Secondary | ICD-10-CM

## 2020-01-12 DIAGNOSIS — K219 Gastro-esophageal reflux disease without esophagitis: Secondary | ICD-10-CM

## 2020-01-12 DIAGNOSIS — R32 Unspecified urinary incontinence: Secondary | ICD-10-CM

## 2020-01-12 NOTE — Telephone Encounter (Signed)
Requested Prescriptions  Pending Prescriptions Disp Refills   pantoprazole (PROTONIX) 40 MG tablet [Pharmacy Med Name: PANTOPRAZOLE SOD DR 40 MG TAB] 90 tablet 0    Sig: TAKE 1 TABLET BY MOUTH EVERY DAY BEFORE BREAKFAST     Gastroenterology: Proton Pump Inhibitors Failed - 01/12/2020  1:24 AM      Failed - Valid encounter within last 12 months    Recent Outpatient Visits          10 months ago Multiple sclerosis, relapsing-remitting (HCC)   [facility] Smitty Cords, DO   1 year ago Multiple sclerosis, relapsing-remitting [facility])   [facility] Smitty Cords, DO   1 year ago Neurogenic bladder   [facility] Smitty Cords, DO   2 years ago Lip swelling   [facility], Netta Neat, DO   2 years ago MS (multiple sclerosis) [facility])   [facility], Netta Neat, DO              MYRBETRIQ 50 MG TB24 tablet [Pharmacy Med Name: MYRBETRIQ ER 50 MG TABLET] 90 tablet 1    Sig: TAKE 1 TABLET BY MOUTH EVERY DAY     Urology: Bladder Agents - mirabegron Failed - 01/12/2020  1:24 AM      Failed - Valid encounter within last 12 months    Recent Outpatient Visits          10 months ago Multiple sclerosis, relapsing-remitting (HCC)   [facility] Althea Charon, Netta Neat, DO   1 year ago Multiple sclerosis, relapsing-remitting (HCC)   [facility]   1 year ago Neurogenic bladder   [facility] [place], Netta Neat, DO   2 years ago Lip swelling   [facility], Netta Neat, DO   2 years ago MS (multiple sclerosis) [facility])   [facility], Netta Neat, DO             Passed - Last BP in normal range    BP Readings from Last 1 Encounters:  08/19/18 (!) 104/54          pravastatin (PRAVACHOL) 40 MG tablet [Pharmacy Med Name:  PRAVASTATIN SODIUM 40 MG TAB] 90 tablet 1    Sig: TAKE 1 TABLET BY MOUTH EVERY DAY     Cardiovascular:  Antilipid - Statins Failed - 01/12/2020  1:24 AM      Failed - Total Cholesterol in normal range and within 360 days    Cholesterol, Total  Date Value Ref Range Status  01/06/2015 170 100 - 199 mg/dL Final   Cholesterol  Date Value Ref Range Status  02/12/2018 168 <200 mg/dL Final         Failed - LDL in normal range and within 360 days    LDL Cholesterol (Calc)  Date Value Ref Range Status  02/12/2018 93 mg/dL (calc) Final    Comment:    Reference range: <100 . Desirable range <100 mg/dL for primary prevention;   <70 mg/dL for patients with CHD or diabetic patients  with > or = 2 CHD risk factors. [name] Kitchen LDL-C is now calculated using the Martin-Hopkins  calculation, which is a validated novel method providing  better accuracy than the Friedewald equation in the  estimation of LDL-C.  Horald Pollen et al. Lenox Ahr. 9622;297(98): 2061-2068  (http://education.[facility].com/faq/FAQ164)          Failed -  HDL in normal range and within 360 days    HDL  Date Value Ref Range Status  02/12/2018 58 >50 mg/dL Final  66/44/0347 52 >42 mg/dL Final    Comment:    According to ATP-III Guidelines, HDL-C >59 mg/dL is considered a negative risk factor for CHD.          Failed - Triglycerides in normal range and within 360 days    Triglycerides  Date Value Ref Range Status  02/12/2018 77 <150 mg/dL Final         Failed - Valid encounter within last 12 months    Recent Outpatient Visits          10 months ago Multiple sclerosis, relapsing-remitting (HCC)   [facility] Smitty Cords, DO   1 year ago Multiple sclerosis, relapsing-remitting [facility])   [facility] Smitty Cords, DO   1 year ago Neurogenic bladder   [facility] Smitty Cords, DO   2 years ago Lip swelling   [facility]  Smitty Cords, DO   2 years ago MS (multiple sclerosis) [facility])   [facility] Smitty Cords, [place]             Passed - Patient is not pregnant      Patient's last office visit was on 03/18/2019.

## 2020-01-12 NOTE — Telephone Encounter (Signed)
Requested medications are due for refill today?  Yes  Requested medications are on active medication list?  Yes  Last Refill:   07/15/2019  # 90 with one refill   Future visit scheduled? No   Notes to Clinic:  Medication failed RX refill protocol due to no labs within the past 360 days. Last labs were performed on 02/12/2018.  Last virtual visit was on 03/18/2019.

## 2020-01-17 ENCOUNTER — Other Ambulatory Visit: Payer: Self-pay | Admitting: Family Medicine

## 2020-01-17 DIAGNOSIS — G894 Chronic pain syndrome: Secondary | ICD-10-CM

## 2020-01-17 DIAGNOSIS — G35 Multiple sclerosis: Secondary | ICD-10-CM

## 2020-01-17 NOTE — Telephone Encounter (Signed)
Requested Prescriptions  Pending Prescriptions Disp Refills   gabapentin (NEURONTIN) 300 MG capsule [Pharmacy Med Name: GABAPENTIN 300 MG CAPSULE] 540 capsule 0    Sig: TAKE 2 CAPSULES BY MOUTH 3 TIMES A DAY     Neurology: Anticonvulsants - gabapentin Failed - 01/17/2020  9:34 AM      Failed - Valid encounter within last 12 months    Recent Outpatient Visits          10 months ago Multiple sclerosis, relapsing-remitting [facility])   [facility] Smit[facility] Cords, DO   1 year ago Multiple sclerosis, relapsing-remitting [facility])   [facility] Smit[facility] C[facility], DO   1 year ago Neurogenic bladder   [facility] Smitty Cords, DO   2 years ago Lip swelling   [facility] Smitty Cords, DO   2 years ago MS (multiple sclerosis) [facility])   [facility], Netta Neat, DO

## 2020-01-26 ENCOUNTER — Other Ambulatory Visit: Payer: Self-pay | Admitting: Family Medicine

## 2020-01-26 DIAGNOSIS — J432 Centrilobular emphysema: Secondary | ICD-10-CM

## 2020-01-26 DIAGNOSIS — G35 Multiple sclerosis: Secondary | ICD-10-CM | POA: Diagnosis not present

## 2020-01-26 NOTE — Telephone Encounter (Signed)
Requested medication (s) are due for refill today: Yes  Requested medication (s) are on the active medication list: Yes  Last refill:  09/14/19  Future visit scheduled: No  Notes to clinic:  Pt. Is due an OV. See request.    Requested Prescriptions  Pending Prescriptions Disp Refills   SPIRIVA HANDIHALER 18 MCG inhalation capsule [Pharmacy Med Name: SPIRIVA 18 MCG CP-HANDIHALER] 30 capsule 3    Sig: INHALE 1 CAPSULE VIA HANDIHALER ONCE DAILY AT THE SAME TIME EVERY DAY      Pulmonology:  Anticholinergic Agents Failed - 01/26/2020  1:27 AM      Failed - Valid encounter within last 12 months    Recent Outpatient Visits           10 months ago Multiple sclerosis, relapsing-remitting (HCC)   [facility] Smit[facility], DO   1 year ago Multiple sclerosis, relapsing-remitting [facility])   [facility] Smitty Cords, DO   1 year ago Neurogenic bladder   [facility] Smit[facility] Cords, DO   2 years ago Lip swelling   [facility] Smitty Cords, DO   2 years ago MS (multiple sclerosis) [facility] [facility])   [facility] [place], Netta Neat, DO

## 2020-02-05 ENCOUNTER — Other Ambulatory Visit: Payer: Self-pay | Admitting: Family Medicine

## 2020-02-05 DIAGNOSIS — E782 Mixed hyperlipidemia: Secondary | ICD-10-CM

## 2020-02-05 NOTE — Telephone Encounter (Signed)
Requested medication (s) are due for refill today: yes  Requested medication (s) are on the active medication list: yes  Last refill:  01/12/20 (30 day courtesy RF)  Future visit scheduled: no  Notes to clinic:  called pt and LM on VM to call office and schedule appt   Requested Prescriptions  Pending Prescriptions Disp Refills   pravastatin (PRAVACHOL) 40 MG tablet [Pharmacy Med Name: PRAVASTATIN SODIUM 40 MG TAB] 30 tablet 0    Sig: TAKE 1 TABLET BY MOUTH EVERY DAY      Cardiovascular:  Antilipid - Statins Failed - 02/05/2020  9:11 AM      Failed - Total Cholesterol in normal range and within 360 days    Cholesterol, Total  Date Value Ref Range Status  01/06/2015 170 100 - 199 mg/dL Final   Cholesterol  Date Value Ref Range Status  02/12/2018 168 <200 mg/dL Final          Failed - LDL in normal range and within 360 days    LDL Cholesterol (Calc)  Date Value Ref Range Status  02/12/2018 93 mg/dL (calc) Final    Comment:    Reference range: <100 . Desirable range <100 mg/dL for primary prevention;   <70 mg/dL for patients with CHD or diabetic patients  with > or = 2 CHD risk factors. [name] Kitchen LDL-C is now calculated using the Martin-Hopkins  calculation, which is a validated novel method providing  better accuracy than the Friedewald equation in the  estimation of LDL-C.  Horald Pollen et al. Lenox Ahr. 5465;681(27): 2061-2068  (http://education.[facility].com/faq/FAQ164)           Failed - HDL in normal range and within 360 days    HDL  Date Value Ref Range Status  02/12/2018 58 >50 mg/dL Final  51/70/0174 52 >94 mg/dL Final    Comment:    According to ATP-III Guidelines, HDL-C >59 mg/dL is considered a negative risk factor for CHD.           Failed - Triglycerides in normal range and within 360 days    Triglycerides  Date Value Ref Range Status  02/12/2018 77 <150 mg/dL Final          Failed - Valid encounter within last 12 months    Recent Outpatient  Visits           10 months ago Multiple sclerosis, relapsing-remitting (HCC)   [facility] Smitty Cords, DO   1 year ago Multiple sclerosis, relapsing-remitting [facility])   [facility] Smitty Cords, DO   1 year ago Neurogenic bladder   [facility] Smitty Cords, DO   2 years ago Lip swelling   [facility] Smitty Cords, DO   2 years ago MS (multiple sclerosis) [facility])   [facility] Smitty Cords, DO              Passed - Patient is not pregnant

## 2020-02-15 ENCOUNTER — Ambulatory Visit: Payer: Medicare Other | Admitting: Family Medicine

## 2020-02-18 ENCOUNTER — Other Ambulatory Visit: Payer: Self-pay | Admitting: Family Medicine

## 2020-02-18 DIAGNOSIS — J432 Centrilobular emphysema: Secondary | ICD-10-CM

## 2020-02-18 NOTE — Telephone Encounter (Signed)
Requested  medications are  due for refill today yes  Requested medications are on the active medication list yes  Last refill 8/4  Future visit scheduled Dec 2021  Notes to clinic Has already had a curtesy visit and no upcoming visit scheduled.

## 2020-02-24 ENCOUNTER — Other Ambulatory Visit: Payer: Self-pay | Admitting: Family Medicine

## 2020-02-24 DIAGNOSIS — E782 Mixed hyperlipidemia: Secondary | ICD-10-CM

## 2020-02-24 NOTE — Telephone Encounter (Signed)
Requested  medications are  due for refill today yes  Requested medications are on the active medication list yes  Last refill 7/21  Last visit 02/2019  Future visit scheduled Not until Dec 2022  Notes to clinic Failed protocol of labs within 360 days,  last visit 02/2019 asks pt to come back in 3-6 months.

## 2020-03-02 ENCOUNTER — Other Ambulatory Visit: Payer: Self-pay | Admitting: Family Medicine

## 2020-03-02 DIAGNOSIS — E782 Mixed hyperlipidemia: Secondary | ICD-10-CM

## 2020-03-02 NOTE — Telephone Encounter (Signed)
Requested  medications are  due for refill today yes  Requested medications are on the active medication list yes  Last refill 01/12/20  Future visit scheduled No  Notes to clinic Already had a curtesy refill, no upcoming appt until Dec. Labs are two years out of date.

## 2020-03-27 ENCOUNTER — Other Ambulatory Visit: Payer: Self-pay | Admitting: Family Medicine

## 2020-03-27 DIAGNOSIS — J432 Centrilobular emphysema: Secondary | ICD-10-CM

## 2020-03-27 NOTE — Telephone Encounter (Signed)
Requested medication (s) are due for refill today:  Yes  Requested medication (s) are on the active medication list:  Yes  Future visit scheduled:  Yes  Last Refill: 06/14/19; 8.5 g.; refill x 3  Notes to clinic: last seen in 02/2019 by Dr. Kirtland Bouchard.  No showed her appt. In August.  Called pt.; left vm to call and schedule a f/u appt.  Requested Prescriptions  Pending Prescriptions Disp Refills   albuterol (VENTOLIN HFA) 108 (90 Base) MCG/ACT inhaler [Pharmacy Med Name: ALBUTEROL HFA (PROAIR) INHALER] 8.5 each 3    Sig: USE 2 PUFFS EVERY SIX HOURS AS NEEDED FOR WHEEZING OR SHORTNESS OF BREATH.      Pulmonology:  Beta Agonists Failed - 03/27/2020  1:31 AM      Failed - One inhaler should last at least one month. If the patient is requesting refills earlier, contact the patient to check for uncontrolled symptoms.      Failed - Valid encounter within last 12 months    Recent Outpatient Visits           1 year ago Multiple sclerosis, relapsing-remitting [facility])   [facility] Smitty Cords, DO   1 year ago Multiple sclerosis, relapsing-remitting [facility])   [facility] Smitty Cords, DO   2 years ago Neurogenic bladder   [facility] Smitty Cords, DO   2 years ago Lip swelling   [facility] Smitty Cords, DO   3 years ago MS (multiple sclerosis) [facility])   [facility], Netta Neat, DO

## 2020-03-31 ENCOUNTER — Other Ambulatory Visit: Payer: Self-pay | Admitting: Family Medicine

## 2020-03-31 DIAGNOSIS — E782 Mixed hyperlipidemia: Secondary | ICD-10-CM

## 2020-03-31 NOTE — Telephone Encounter (Signed)
Requested medication (s) are due for refill today: Yes  Requested medication (s) are on the active medication list: Yes  Last refill:  01/12/20  Future visit scheduled: No  Notes to clinic:  Left pt. A message to call for appointment for further refills.    Requested Prescriptions  Pending Prescriptions Disp Refills   pravastatin (PRAVACHOL) 40 MG tablet [Pharmacy Med Name: PRAVASTATIN SODIUM 40 MG TAB] 30 tablet 0    Sig: TAKE 1 TABLET BY MOUTH EVERY DAY      Cardiovascular:  Antilipid - Statins Failed - 03/31/2020  1:35 AM      Failed - Total Cholesterol in normal range and within 360 days    Cholesterol, Total  Date Value Ref Range Status  01/06/2015 170 100 - 199 mg/dL Final   Cholesterol  Date Value Ref Range Status  02/12/2018 168 <200 mg/dL Final          Failed - LDL in normal range and within 360 days    LDL Cholesterol (Calc)  Date Value Ref Range Status  02/12/2018 93 mg/dL (calc) Final    Comment:    Reference range: <100 . Desirable range <100 mg/dL for primary prevention;   <70 mg/dL for patients with CHD or diabetic patients  with > or = 2 CHD risk factors. [name] Kitchen LDL-C is now calculated using the Martin-Hopkins  calculation, which is a validated novel method providing  better accuracy than the Friedewald equation in the  estimation of LDL-C.  Horald Pollen et al. Lenox Ahr. 1324;401(02): 2061-2068  (http://education.[facility].com/faq/FAQ164)           Failed - HDL in normal range and within 360 days    HDL  Date Value Ref Range Status  02/12/2018 58 >50 mg/dL Final  72/53/6644 52 >03 mg/dL Final    Comment:    According to ATP-III Guidelines, HDL-C >59 mg/dL is considered a negative risk factor for CHD.           Failed - Triglycerides in normal range and within 360 days    Triglycerides  Date Value Ref Range Status  02/12/2018 77 <150 mg/dL Final          Failed - Valid encounter within last 12 months    Recent Outpatient Visits            1 year ago Multiple sclerosis, relapsing-remitting (HCC)   [facility] Smitty Cords, DO   1 year ago Multiple sclerosis, relapsing-remitting [facility])   [facility] Smit[facility], DO   2 years ago Neurogenic bladder   [facility] Smitty Cords, DO   3 years ago Lip swelling   [facility] Smitty Cords, DO   3 years ago MS (multiple sclerosis) [facility])   [facility] Smitty Cords, DO              Passed - Patient is not pregnant

## 2020-04-01 ENCOUNTER — Other Ambulatory Visit: Payer: Self-pay | Admitting: Family Medicine

## 2020-04-01 DIAGNOSIS — M62830 Muscle spasm of back: Secondary | ICD-10-CM

## 2020-04-01 NOTE — Telephone Encounter (Signed)
Requested medication (s) are due for refill today: yes  Requested medication (s) are on the active medication list: yes  Last refill:  03/08/19  Future visit scheduled: no  Notes to clinic:  called pt and LM on VM to call office to make appt   Requested Prescriptions  Pending Prescriptions Disp Refills   baclofen (LIORESAL) 10 MG tablet [Pharmacy Med Name: BACLOFEN 10 MG TABLET] 30 tablet 2    Sig: TAKE 1 TABLET BY MOUTH AT BEDTIME AS NEEDED FOR MUSCLE SPASMS      Not Delegated - Analgesics:  Muscle Relaxants Failed - 04/01/2020  9:05 AM      Failed - This refill cannot be delegated      Failed - Valid encounter within last 6 months    Recent Outpatient Visits           1 year ago Multiple sclerosis, relapsing-remitting (HCC)   [facility] Smitty Cords, DO   1 year ago Multiple sclerosis, relapsing-remitting [facility])   [facility] Smitty Cords, DO   2 years ago Neurogenic bladder   [facility] Smit[facility] Cords, DO   3 years ago Lip swelling   [facility], Netta Neat, DO   3 years ago MS (multiple sclerosis) [facility])   [facility], Netta Neat, DO

## 2020-04-05 ENCOUNTER — Other Ambulatory Visit: Payer: Self-pay | Admitting: Family Medicine

## 2020-04-05 DIAGNOSIS — N319 Neuromuscular dysfunction of bladder, unspecified: Secondary | ICD-10-CM

## 2020-04-05 DIAGNOSIS — K219 Gastro-esophageal reflux disease without esophagitis: Secondary | ICD-10-CM

## 2020-04-05 DIAGNOSIS — R32 Unspecified urinary incontinence: Secondary | ICD-10-CM

## 2020-04-05 NOTE — Telephone Encounter (Signed)
Requested Prescriptions  Pending Prescriptions Disp Refills  . MYRBETRIQ 50 MG TB24 tablet [Pharmacy Med Name: MYRBETRIQ ER 50 MG TABLET] 90 tablet 0    Sig: TAKE 1 TABLET BY MOUTH EVERY DAY     Urology: Bladder Agents - mirabegron Failed - 04/05/2020  1:22 AM      Failed - Valid encounter within last 12 months    Recent Outpatient Visits          1 year ago Multiple sclerosis, relapsing-remitting (HCC)   [facility] Smitty Cords, DO   1 year ago Multiple sclerosis, relapsing-remitting [facility])   [facility] Smitty Cords, DO   2 years ago Neurogenic bladder   [facility] Smitty Cords, DO   3 years ago Lip swelling   [facility] [place], Netta Neat, DO   3 years ago MS (multiple sclerosis) [facility])   [facility] Althea Charon, Netta Neat, DO      Future Appointments            In 6 days Althea Charon, Netta Neat, DO [facility]           Passed - Last BP in normal range    BP Readings from Last 1 Encounters:  08/19/18 (!) 104/54         . pantoprazole (PROTONIX) 40 MG tablet [Pharmacy Med Name: PANTOPRAZOLE SOD DR 40 MG TAB] 90 tablet 0    Sig: TAKE 1 TABLET BY MOUTH EVERY DAY BEFORE BREAKFAST     Gastroenterology: Proton Pump Inhibitors Failed - 04/05/2020  1:22 AM      Failed - Valid encounter within last 12 months    Recent Outpatient Visits          1 year ago Multiple sclerosis, relapsing-remitting (HCC)   [facility] Smitty Cords, DO   1 year ago Multiple sclerosis, relapsing-remitting [facility])   [facility] Smitty Cords, DO   2 years ago Neurogenic bladder   [facility] Smitty Cords, DO   3 years ago Lip swelling   [facility] Smitty Cords, DO   3 years ago MS (multiple sclerosis) [facility])   [facility]  Althea Charon, Netta Neat, DO      Future Appointments            In 6 days Althea Charon, Netta Neat, DO [facility]           Patient has upcoming appointment in 6 days.

## 2020-04-11 ENCOUNTER — Ambulatory Visit (INDEPENDENT_AMBULATORY_CARE_PROVIDER_SITE_OTHER): Payer: Medicare Other | Admitting: Family Medicine

## 2020-04-11 ENCOUNTER — Other Ambulatory Visit: Payer: Self-pay

## 2020-04-11 ENCOUNTER — Encounter: Payer: Self-pay | Admitting: Family Medicine

## 2020-04-11 VITALS — BP 109/65 | HR 72 | Temp 97.5°F | Resp 16 | Ht 65.0 in | Wt 151.0 lb

## 2020-04-11 DIAGNOSIS — R32 Unspecified urinary incontinence: Secondary | ICD-10-CM

## 2020-04-11 DIAGNOSIS — N319 Neuromuscular dysfunction of bladder, unspecified: Secondary | ICD-10-CM

## 2020-04-11 DIAGNOSIS — K219 Gastro-esophageal reflux disease without esophagitis: Secondary | ICD-10-CM

## 2020-04-11 DIAGNOSIS — J432 Centrilobular emphysema: Secondary | ICD-10-CM

## 2020-04-11 DIAGNOSIS — G35 Multiple sclerosis: Secondary | ICD-10-CM | POA: Diagnosis not present

## 2020-04-11 DIAGNOSIS — M62830 Muscle spasm of back: Secondary | ICD-10-CM

## 2020-04-11 DIAGNOSIS — Z23 Encounter for immunization: Secondary | ICD-10-CM | POA: Diagnosis not present

## 2020-04-11 DIAGNOSIS — M8589 Other specified disorders of bone density and structure, multiple sites: Secondary | ICD-10-CM

## 2020-04-11 DIAGNOSIS — G894 Chronic pain syndrome: Secondary | ICD-10-CM

## 2020-04-11 DIAGNOSIS — E782 Mixed hyperlipidemia: Secondary | ICD-10-CM

## 2020-04-11 DIAGNOSIS — G35A Relapsing-remitting multiple sclerosis: Secondary | ICD-10-CM

## 2020-04-11 DIAGNOSIS — D539 Nutritional anemia, unspecified: Secondary | ICD-10-CM

## 2020-04-11 MED ORDER — PANTOPRAZOLE SODIUM 40 MG PO TBEC: 40.0000 mg | DELAYED_RELEASE_TABLET | Freq: Every day | ORAL | 1 refills | Status: DC

## 2020-04-11 MED ORDER — SPIRIVA HANDIHALER 18 MCG IN CAPS: ORAL_CAPSULE | RESPIRATORY_TRACT | 5 refills | Status: DC

## 2020-04-11 MED ORDER — PRAVASTATIN SODIUM 40 MG PO TABS: 40.0000 mg | ORAL_TABLET | Freq: Every day | ORAL | 1 refills | Status: DC

## 2020-04-11 MED ORDER — BACLOFEN 10 MG PO TABS: 10.0000 mg | ORAL_TABLET | Freq: Every evening | ORAL | 1 refills | Status: DC | PRN

## 2020-04-11 MED ORDER — MIRABEGRON ER 50 MG PO TB24: 50.0000 mg | ORAL_TABLET | Freq: Every day | ORAL | 1 refills | Status: DC

## 2020-04-11 MED ORDER — ALENDRONATE SODIUM 70 MG PO TABS: ORAL_TABLET | ORAL | 3 refills | Status: DC

## 2020-04-11 NOTE — Assessment & Plan Note (Addendum)
Chronic relapsing remitting MS. Currently stable without flare Complicated with neurogenic bladder, see A&P Followed by [facility] Dr Malvin Johns Continue current MS targeted therapy per med list (Tecfidera, Ampyra) Completed PT  Pain management on Tramadol, Gabapentin - neuro refilled  Continue Baclofen, will refill today Continue Myrbetriq will refill today  Discussed with caregiver [name], that if cognitive decline continues she will need further home assistance, and she states that family would consider locating residential option in future if decline.

## 2020-04-11 NOTE — Assessment & Plan Note (Signed)
Stable on Alendronate for 5 years on bisphosphonate Refill Alendronate today - repeat order today and then next year order DEXA 2022

## 2020-04-11 NOTE — Assessment & Plan Note (Signed)
Due lipids at next visit 09/2020

## 2020-04-11 NOTE — Assessment & Plan Note (Signed)
Stable without exacerbation Improved on Spiriva - refill Continue Albuterol PRN 

## 2020-04-11 NOTE — Patient Instructions (Addendum)
Thank you for coming to the office today.  We will check with [name] about the medications. If possible we would like you to bring her to next visit so we can review your medicines, bring pill bottles as well.  We will need to see you twice a year to keep medicines active.  DUE for FASTING BLOOD WORK (no food or drink after midnight before the lab appointment, only water or coffee without cream/sugar on the morning of)  AFTER VISIT   Please schedule a Follow-up Appointment to: Return in about 6 months (around 10/10/2020) for 6 month follow-up MS, med refill, fasting lab AFTER visit need earlier appointment (before 11am).  If you have any other questions or concerns, please feel free to call the office or send a message through MyChart. You may also schedule an earlier appointment if necessary.  Additionally, you may be receiving a survey about your experience at our office within a few days to 1 week by e-mail or mail. We value your feedback.  [name], DO [facility], [place]

## 2020-04-11 NOTE — Progress Notes (Signed)
Subjective:    Patient ID: [name], female    DOB: 1955-09-14, 64 y.o.   MRN: [id number]  [name] is a 64 y.o. female presenting on 04/11/2020 for Multiple Sclerosis (refill)   HPI   Multiple Sclerosis, Chronicrelapsing remitting/ Neurogenic Bladder/ Chronic Pain Syndrome Followed [facility] Dr Malvin Johns / Dot Lanes NP, Last visit 01/26/20. Medications were renewed. Last visit with me 1 year ago. Now overdue for visit for refills and follow-up. She had blood drawn 01/2020, results on [facility]Everywhere. Today request med refills. She is not with her caregiver, [name]. She has additional help and [name] was reached by phone to provide additional information listed below.  She continues with Home Health aide most days help with house work cleaning, grocery store and meal prep, medications.  Chronic Left leg drop, she has worked on this with PT. She uses rolling walker. She is able to ambulate using this, she does not drive, transportation brought her to apt today  Concern with some gradual cognitive decline still in setting of MS. No recent flare with MS For her pain she was referred to [facility] pain management, has had prior MRI Lumbar  - Regarding MS medications - Continue Tecfidera 240 mg twice a day, refilled. - Continue Ampyra 10 mg twice a day, refilled.  - For pain and function she takes the following: -Taking Tramadol 50mg  nightly PRN pain, refilled by Neuro - Taking Gabapentin 300mg  x 2 in AM, 2 in afternoon, and 3 PM, slightly increased dose per Neuro with good results, was refilled. - Taking Baclofen 10mg  nightly PRN, needs refill  Difficulty getting out of bed at times, but once up she can function - Has issues with neurogenic bladder from MS, still issues with some overflow and urinary incontinence, she has urge to go, now more frequently, improved on Myrbetriq50mg  daily, still on using pads / depends formedical supply for her urinary incontinence  that is needed to help keep her dry and avoid recurrent UTI. She is doing better with hygiene and cleaning after soiling. Admits still episodic low back pain at times, worse at night, sore and stiff muscles, has tried muscle relaxant in past was helpful. Admits chronic L sided weakness, some episodes of intermittent swelling seem related to inactivity, improve with rest and elevation- seems improved Admits urinary incontinence Denies dysuria, fever chills, hematuria  Additional PMH - Centrilobular Emphysema - doing well needs refill on Spiriva - GERD - needs refill on pantoprazole, controlled  Health Maintenance: Due for Flu Shot, will receive today   She declines Cologuard, previously ordered, never completed.     Depression screen [facility] 2/9 05/25/2019 03/18/2019 08/19/2018  Decreased Interest 0 0 0  Down, Depressed, Hopeless 0 0 0  PHQ - 2 Score 0 0 0  Altered sleeping - - 0  Tired, decreased energy - - 0  Change in appetite - - 0  Feeling bad or failure about yourself  - - 0  Trouble concentrating - - 0  Moving slowly or fidgety/restless - - 0  Suicidal thoughts - - 0  PHQ-9 Score - - 0  Difficult doing work/chores - - Not difficult at all    Social History   Tobacco Use  . Smoking status: Current Every Day Smoker    Packs/day: 1.00    Years: 20.00    Pack years: 20.00    Types: Cigars    Last attempt to quit: 01/03/1995    Years since quitting: 25.2  .  Smokeless tobacco: Never Used  . Tobacco comment: black and mild   Vaping Use  . Vaping Use: Never used  Substance Use Topics  . Alcohol use: No    Alcohol/week: 0.0 standard drinks  . Drug use: Yes    Comment: smokes pods in past    Review of Systems Per HPI unless specifically indicated above     Objective:    BP 109/65   Pulse 72   Temp (!) 97.5 F (36.4 C) (Temporal)   Resp 16   Ht 5\' 5"  (1.651 m)   Wt 151 lb (68.5 kg)   SpO2 100%   BMI 25.13 kg/m   Wt Readings from Last 3 Encounters:  04/11/20  151 lb (68.5 kg)  08/19/18 153 lb (69.4 kg)  05/12/18 149 lb 6.4 oz (67.8 kg)    Physical Exam Vitals and nursing note reviewed.  Constitutional:      General: She is not in acute distress.    Appearance: She is well-developed. She is not diaphoretic.     Comments: Currently well today, mostly comfortable, cooperative.  HENT:     Head: Normocephalic and atraumatic.  Eyes:     General:        Right eye: No discharge.        Left eye: No discharge.     Conjunctiva/sclera: Conjunctivae normal.  Cardiovascular:     Rate and Rhythm: Normal rate and regular rhythm.     Heart sounds: Normal heart sounds. No murmur heard.   Pulmonary:     Effort: Pulmonary effort is normal. No respiratory distress.     Breath sounds: Normal breath sounds. No wheezing or rales.  Abdominal:     General: Bowel sounds are normal. There is no distension.     Palpations: Abdomen is soft.     Tenderness: There is no abdominal tenderness.  Musculoskeletal:     Cervical back: Normal range of motion and neck supple.     Comments: Low Back / Lower Extremity Inspection: Normal appearance, no spinal deformity, symmetrical. Palpation: No tenderness over spinous processes. Stable bilateral lower back muscle spasm and hypertonicity, seems R>L ROM: Good intact ROM lower extremity flex/ext legs, hips Strength: Bilateral knee flex/ext 5/5. IMPROVED chronic residual weakness now nearly 5/5 Left hip/knee/ankle, grip and upper ext intact 5/5 Neurovascular: intact distal sensation to light touch  Uses rollator walker  Skin:    General: Skin is warm and dry.     Findings: No erythema or rash.  Neurological:     Mental Status: She is alert and oriented to person, place, and time.  Psychiatric:        Behavior: Behavior normal.    Results for orders placed or performed in visit on 02/12/18  Hemoglobin A1c  Result Value Ref Range   Hgb A1c MFr Bld 5.3 <5.7 % of total Hgb   Mean Plasma Glucose 105 (calc)   eAG  (mmol/L) 5.8 (calc)  CBC with Differential/Platelet  Result Value Ref Range   WBC 3.8 3.8 - 10.8 Thousand/uL   RBC 3.91 3.80 - 5.10 Million/uL   Hemoglobin 13.2 11.7 - 15.5 g/dL   HCT 02/14/18 35 - 45 %   MCV 98.5 80.0 - 100.0 fL   MCH 33.8 (H) 27.0 - 33.0 pg   MCHC 34.3 32.0 - 36.0 g/dL   RDW 38.9 37.3 - 42.8 %   Platelets 194 140 - 400 Thousand/uL   MPV 10.5 7.5 - 12.5 fL   Neutro Abs 2,443  1,500 - 7,800 cells/uL   Lymphs Abs 847 (L) 850 - 3,900 cells/uL   WBC mixed population 437 200 - 950 cells/uL   Eosinophils Absolute 61 15.0 - 500.0 cells/uL   Basophils Absolute 11 0.0 - 200.0 cells/uL   Neutrophils Relative % 64.3 %   Total Lymphocyte 22.3 %   Monocytes Relative 11.5 %   Eosinophils Relative 1.6 %   Basophils Relative 0.3 %  COMPLETE METABOLIC PANEL WITH GFR  Result Value Ref Range   Glucose, Bld 86 65 - 99 mg/dL   BUN 8 7 - 25 mg/dL   Creat 4.19 3.79 - 0.24 mg/dL   GFR, Est Non African American 101 > OR = 60 mL/min/1.15m2   GFR, Est African American 117 > OR = 60 mL/min/1.86m2   BUN/Creatinine Ratio NOT APPLICABLE 6 - 22 (calc)   Sodium 143 135 - 146 mmol/L   Potassium 4.6 3.5 - 5.3 mmol/L   Chloride 105 98 - 110 mmol/L   CO2 34 (H) 20 - 32 mmol/L   Calcium 9.4 8.6 - 10.4 mg/dL   Total Protein 6.7 6.1 - 8.1 g/dL   Albumin 3.9 3.6 - 5.1 g/dL   Globulin 2.8 1.9 - 3.7 g/dL (calc)   AG Ratio 1.4 1.0 - 2.5 (calc)   Total Bilirubin 0.4 0.2 - 1.2 mg/dL   Alkaline phosphatase (APISO) 70 33 - 130 U/L   AST 11 10 - 35 U/L   ALT 9 6 - 29 U/L  Lipid panel  Result Value Ref Range   Cholesterol 168 <200 mg/dL   HDL 58 >09 mg/dL   Triglycerides 77 <735 mg/dL   LDL Cholesterol (Calc) 93 mg/dL (calc)   Total CHOL/HDL Ratio 2.9 <5.0 (calc)   Non-HDL Cholesterol (Calc) 110 <130 mg/dL (calc)  VITAMIN D 25 Hydroxy (Vit-D Deficiency, Fractures)  Result Value Ref Range   Vit D, 25-Hydroxy 33 30 - 100 ng/mL      Assessment & Plan:   Problem List Items Addressed This Visit     Osteopenia    Stable on Alendronate for 5 years on bisphosphonate Refill Alendronate today - repeat order today and then next year order DEXA 2022      Relevant Medications   alendronate (FOSAMAX) 70 MG tablet   Neurogenic bladder    Secondary to Multiple Sclerosis, with mixed overflow incontinence Followed by [facility] Not followed by Urology  Plan Continue Myrbetriq from 50 XL daily - max dose goal to reduce frequency - Indicated for incontinence supplies, pull-ups, continue to use regularly to prevent future UTI risk that would come with soiling. Will continue to treat bladder with Myrbetriq - Future may need Urology - discussed may need urodynamics or possible suprapubic in future if significant concern from MS      Relevant Medications   mirabegron ER (MYRBETRIQ) 50 MG TB24 tablet   Multiple sclerosis, relapsing-remitting (HCC) - Primary    Chronic relapsing remitting MS. Currently stable without flare Complicated with neurogenic bladder, see A&P Followed [facility] Dr Malvin Johns Continue current MS targeted therapy per med list (Tecfidera, Ampyra) Completed PT  Pain management on Tramadol, Gabapentin - neuro refilled  Continue Baclofen, will refill today Continue Myrbetriq will refill today  Discussed with caregiver [name], that if cognitive decline continues she will need further home assistance, and she states that family would consider locating residential option in future if decline.      Relevant Medications   dalfampridine 10 MG TB12   traMADol (ULTRAM)  50 MG tablet   gabapentin (NEURONTIN) 300 MG capsule   Mixed hyperlipidemia    Due lipids at next visit 09/2020      Relevant Medications   pravastatin (PRAVACHOL) 40 MG tablet   Macrocytic anemia   Incontinence in female    See A&P neurogenic bladder      Relevant Medications   mirabegron ER (MYRBETRIQ) 50 MG TB24 tablet   GERD without esophagitis    Stable, controlled Refill Pantoprazole       Relevant Medications   pantoprazole (PROTONIX) 40 MG tablet   Chronic pain    Stable chronic problem secondary to MS Followed by [facility]rology Check PDMP Managed on Tramadol, Gabapentin, Tylenol  Refill Baclofen      Relevant Medications   traMADol (ULTRAM) 50 MG tablet   gabapentin (NEURONTIN) 300 MG capsule   baclofen (LIORESAL) 10 MG tablet   Centrilobular emphysema (HCC)    Stable without exacerbation Improved on Spiriva - refill Continue Albuterol PRN      Relevant Medications   tiotropium (SPIRIVA HANDIHALER) 18 MCG inhalation capsule    Other Visit Diagnoses    Needs flu shot       Relevant Orders   Flu Vaccine QUAD 6+ mos PF IM (Fluarix Quad PF) (Completed)   Muscle spasm of back       Relevant Medications   baclofen (LIORESAL) 10 MG tablet      Meds ordered this encounter  Medications  . tiotropium (SPIRIVA HANDIHALER) 18 MCG inhalation capsule    Sig: INHALE 1 CAPSULE VIA HANDIHALER ONCE DAILY AT THE SAME TIME EVERY DAY    Dispense:  30 capsule    Refill:  5  . pravastatin (PRAVACHOL) 40 MG tablet    Sig: Take 1 tablet (40 mg total) by mouth daily.    Dispense:  90 tablet    Refill:  1    Needs appointment for blood work  . pantoprazole (PROTONIX) 40 MG tablet    Sig: Take 1 tablet (40 mg total) by mouth daily before breakfast.    Dispense:  90 tablet    Refill:  1  . mirabegron ER (MYRBETRIQ) 50 MG TB24 tablet    Sig: Take 1 tablet (50 mg total) by mouth daily.    Dispense:  90 tablet    Refill:  1  . baclofen (LIORESAL) 10 MG tablet    Sig: Take 1 tablet (10 mg total) by mouth at bedtime as needed for muscle spasms.    Dispense:  90 tablet    Refill:  1  . alendronate (FOSAMAX) 70 MG tablet    Sig: TAKE 1 TABLET BY MOUTH EVERY 7 (SEVEN) DAYS. TAKE WITH A FULL GLASS OF WATER ON AN EMPTY STOMACH.    Dispense:  12 tablet    Refill:  3    Orders Placed This Encounter  Procedures  . Flu Vaccine QUAD 6+ mos PF IM (Fluarix Quad PF)     Follow up  plan: Return in about 6 months (around 10/10/2020) for 6 month follow-up MS, med refill, fasting lab AFTER visit need earlier appointment (before 11am).   Transportation called, to arrive to pick up patient after visit.  [name], DO [facility] 04/11/2020, 12:07 PM

## 2020-04-11 NOTE — Assessment & Plan Note (Addendum)
See A&P neurogenic bladder

## 2020-04-11 NOTE — Assessment & Plan Note (Signed)
Stable, controlled Refill Pantoprazole 

## 2020-04-11 NOTE — Assessment & Plan Note (Signed)
Stable chronic problem secondary to MS Followed by [facility] Neurology Check PDMP Managed on Tramadol, Gabapentin, Tylenol  Refill Baclofen

## 2020-04-11 NOTE — Assessment & Plan Note (Signed)
Secondary to Multiple Sclerosis, with mixed overflow incontinence Followed by [facility] Neuro Not followed by Urology  Plan Continue Myrbetriq from 50 XL daily - max dose goal to reduce frequency - Indicated for incontinence supplies, pull-ups, continue to use regularly to prevent future UTI risk that would come with soiling. Will continue to treat bladder with Myrbetriq - Future may need Urology - discussed may need urodynamics or possible suprapubic in future if significant concern from MS

## 2020-05-30 ENCOUNTER — Ambulatory Visit: Payer: Medicare Other

## 2020-05-30 ENCOUNTER — Telehealth: Payer: Self-pay

## 2020-05-30 NOTE — Telephone Encounter (Signed)
This nurse called [name] in order to let her know  That I was sending the link for virtual AWV. [name] stated that she was not feeling well and was not present with patient. We rescheduled for 06/27/2020

## 2020-06-27 ENCOUNTER — Ambulatory Visit: Payer: Medicare Other

## 2020-06-30 ENCOUNTER — Other Ambulatory Visit: Payer: Self-pay

## 2020-06-30 ENCOUNTER — Emergency Department: Payer: 59

## 2020-06-30 ENCOUNTER — Emergency Department
Admission: EM | Admit: 2020-06-30 | Discharge: 2020-06-30 | Disposition: A | Discharge status: Home / Self Care | Payer: 59 | Attending: Emergency Medicine | Admitting: Emergency Medicine

## 2020-06-30 DIAGNOSIS — M79604 Pain in right leg: Secondary | ICD-10-CM | POA: Diagnosis not present

## 2020-06-30 DIAGNOSIS — Y92003 Bedroom of unspecified non-institutional (private) residence as the place of occurrence of the external cause: Secondary | ICD-10-CM | POA: Diagnosis not present

## 2020-06-30 DIAGNOSIS — M545 Low back pain, unspecified: Secondary | ICD-10-CM | POA: Diagnosis not present

## 2020-06-30 DIAGNOSIS — F17290 Nicotine dependence, other tobacco product, uncomplicated: Secondary | ICD-10-CM | POA: Diagnosis not present

## 2020-06-30 DIAGNOSIS — Y92009 Unspecified place in unspecified non-institutional (private) residence as the place of occurrence of the external cause: Secondary | ICD-10-CM

## 2020-06-30 DIAGNOSIS — W06XXXA Fall from bed, initial encounter: Secondary | ICD-10-CM | POA: Insufficient documentation

## 2020-06-30 DIAGNOSIS — W19XXXA Unspecified fall, initial encounter: Secondary | ICD-10-CM

## 2020-06-30 MED ORDER — TRAMADOL HCL 50 MG PO TABS
50.0000 mg | ORAL_TABLET | Freq: Once | ORAL | Status: AC
  Administered 2020-06-30: 50 mg via ORAL
  Filled 2020-06-30: qty 1

## 2020-06-30 MED ORDER — BACLOFEN 10 MG PO TABS
10.0000 mg | ORAL_TABLET | Freq: Once | ORAL | Status: DC
  Filled 2020-06-30: qty 1

## 2020-06-30 NOTE — ED Triage Notes (Signed)
First Nurse Note:  C/O lower back pain.  Patient slid off of bed 30 minutes prior to EMS called.

## 2020-06-30 NOTE — ED Notes (Signed)
Urine specimen sent to lab

## 2020-06-30 NOTE — Discharge Instructions (Addendum)
Your exam and XRs are normal at this time. Take your home meds as prescribed for pain. Follow-up with your provider for continued symptoms. Return as needed.

## 2020-06-30 NOTE — ED Provider Notes (Signed)
[facility] Emergency Department Provider Note ____________________________________________  Time seen: 1505  I have reviewed the triage vital signs and the nursing notes.  HISTORY  Chief Complaint  Back Pain  HPI [name] is a 65 y.o. female presents to the ED via EMS from home, following a mechanical fall.  Patient with history of MS, ambulates normally with a walker or cane.  She describes an accidental mechanical fall when she was attempting to sit on the bed,  she struck the side of the bed, missing and hitting the floor.  She denies any head injury or loss of consciousness.  She complains primarily of pain to the lower back on the right side with some referral into the right leg.  Patient denies any other injury related to the fall.  Past Medical History:  Diagnosis Date  . Headache   . Hyperlipidemia   . MS (multiple sclerosis) ([place]) 1990  . Neurogenic bladder     Patient Active Problem List   Diagnosis Date Noted  . Lower extremity edema 04/15/2017  . Macrocytic anemia 02/11/2017  . Vitamin D deficiency 02/02/2016  . Mixed hyperlipidemia 01/03/2015  . Multiple sclerosis, relapsing-remitting ([place]) 01/03/2015  . Chronic pain 01/03/2015  . GERD without esophagitis 01/03/2015  . Centrilobular emphysema ([place]) 01/03/2015  . Osteopenia 01/03/2015  . Neurogenic bladder 01/03/2015  . Incontinence in female 01/03/2015    Past Surgical History:  Procedure Laterality Date  . ABDOMINAL HYSTERECTOMY    . OOPHORECTOMY Bilateral     Prior to Admission medications   Medication Sig Start Date End Date Taking? Authorizing Provider  albuterol (PROVENTIL) (2.5 MG/3ML) 0.083% nebulizer solution Take 3 mLs (2.5 mg total) by nebulization every 6 (six) hours as needed for wheezing or shortness of breath. 07/13/18   Karamalegos, Devonne Doughty, DO  albuterol (VENTOLIN HFA) 108 (90 Base) MCG/ACT inhaler USE 2 PUFFS EVERY SIX HOURS AS NEEDED FOR WHEEZING OR  SHORTNESS OF BREATH. 06/14/19   Karamalegos, Devonne Doughty, DO  alendronate (FOSAMAX) 70 MG tablet TAKE 1 TABLET BY MOUTH EVERY 7 (SEVEN) DAYS. TAKE WITH A FULL GLASS OF WATER ON AN EMPTY STOMACH. 04/11/20   Karamalegos, Devonne Doughty, DO  baclofen (LIORESAL) 10 MG tablet Take 1 tablet (10 mg total) by mouth at bedtime as needed for muscle spasms. 04/11/20   Karamalegos, Devonne Doughty, DO  Cholecalciferol 25 MCG (1000 UT) tablet Take by mouth.    [provider]  dalfampridine (AMPYRA) 10 MG TB12 Take 10 mg by mouth 2 (two) times daily.    [provider]  dalfampridine 10 MG TB12 Take by mouth. 01/26/20   [provider]  Dimethyl Fumarate (TECFIDERA) 240 MG CPDR Take 240 mg by mouth 2 (two) times daily.    [provider]  gabapentin (NEURONTIN) 300 MG capsule Take 2 capsules (600mg ) AM and afternoon and 3 caps (900mg ) PM ordered by [facility] neurology 04/11/20   Parks Ranger, Devonne Doughty, DO  mirabegron ER (MYRBETRIQ) 50 MG TB24 tablet Take 1 tablet (50 mg total) by mouth daily. 04/11/20   [name], DO  Misc. Devices (CANE) MISC 1 each by Does not apply route as needed. 05/29/15   Luciana Axe, NP  pantoprazole (PROTONIX) 40 MG tablet Take 1 tablet (40 mg total) by mouth daily before breakfast. 04/11/20   Karamalegos, Devonne Doughty, DO  pravastatin (PRAVACHOL) 40 MG tablet Take 1 tablet (40 mg total) by mouth daily. 04/11/20   Parks Ranger, Devonne Doughty, DO  tiotropium ([place]  HANDIHALER) 18 MCG inhalation capsule INHALE 1 CAPSULE VIA HANDIHALER ONCE DAILY AT THE SAME TIME EVERY DAY 04/11/20   Karamalegos, Netta Neat, DO  traMADol (ULTRAM) 50 MG tablet Take 1 tablet (50 mg total) by mouth at bedtime as needed for moderate pain. Last ordered by [facility] 04/11/20   Smitty Cords, DO    Allergies Penicillins  Family History  Problem Relation Age of Onset  . Diabetes Mother   . Heart disease Mother   . Diabetes Father   . Heart  disease Father   . Diabetes Sister   . Heart disease Sister     Social History Social History   Tobacco Use  . Smoking status: Current Every Day Smoker    Packs/day: 1.00    Years: 20.00    Pack years: 20.00    Types: Cigars    Last attempt to quit: 01/03/1995    Years since quitting: 25.5  . Smokeless tobacco: Never Used  . Tobacco comment: black and mild   Vaping Use  . Vaping Use: Never used  Substance Use Topics  . Alcohol use: No    Alcohol/week: 0.0 standard drinks  . Drug use: Yes    Comment: smokes pods in past    Review of Systems  Constitutional: Negative for fever. Eyes: Negative for visual changes. ENT: Negative for sore throat. Cardiovascular: Negative for chest pain. Respiratory: Negative for shortness of breath. Gastrointestinal: Negative for abdominal pain, vomiting and diarrhea. Genitourinary: Negative for dysuria. Musculoskeletal: Positive for back pain. Skin: Negative for rash. Neurological: Negative for headaches, focal weakness or numbness. ____________________________________________  PHYSICAL EXAM:  VITAL SIGNS: ED Triage Vitals  Enc Vitals Group     BP 06/30/20 1305 102/74     Pulse Rate 06/30/20 1305 97     Resp 06/30/20 1305 16     Temp 06/30/20 1305 98.5 F (36.9 C)     Temp Source 06/30/20 1739 Oral     SpO2 06/30/20 1305 99 %     Weight 06/30/20 1404 152 lb (68.9 kg)     Height 06/30/20 1404 5\' 5"  (1.651 m)     Head Circumference --      Peak Flow --      Pain Score 06/30/20 1404 8     Pain Loc --      Pain Edu? --      Excl. in GC? --     Constitutional: Alert and oriented. Well appearing and in no distress. Head: Normocephalic and atraumatic. Eyes: Conjunctivae are normal. Normal extraocular movements Cardiovascular: Normal rate, regular rhythm. Normal distal pulses. Respiratory: Normal respiratory effort. No wheezes/rales/rhonchi. Gastrointestinal: Soft and nontender. No distention. Musculoskeletal: Normal spinal  alignment without midline tenderness, spasm, deformity, or step-off.  Patient is able demonstrate supine leg extensions without difficulty.  Nontender with normal range of motion in all extremities.  Neurologic: Nerves II through XII grossly intact. Normal speech and language. No gross focal neurologic deficits are appreciated. Skin:  Skin is warm, dry and intact. No rash noted. ____________________________________________   RADIOLOGY  DG Lumbar Spine  IMPRESSION: 1. No fracture or traumatic subluxation. 2. Degenerative changes, as described above. ____________________________________________  PROCEDURES  Tramadol 50 mg PO  Procedures ____________________________________________  INITIAL IMPRESSION / ASSESSMENT AND PLAN / ED COURSE  Patient with ED evaluation of injury sustained following mechanical fall.  Patient presents with primary complaints of some mild low back pain after mechanical fall as she slipped out of her bed.  She denies any  head injury or LOC.  X-rays negative are reassuring at this time.  Patient's exam is also without any red flags or signs of acute neurovascular episode.  She will be discharged with instruction to take her home medications including tramadol and baclofen.  She will see her primary provider for ongoing symptoms or return to the ED if needed.    [name] was evaluated in Emergency Department on 06/30/2020 for the symptoms described in the history of present illness. She was evaluated in the context of the global COVID-19 pandemic, which necessitated consideration that the patient might be at risk for infection with the SARS-CoV-2 virus that causes COVID-19. Institutional protocols and algorithms that pertain to the evaluation of patients at risk for COVID-19 are in a state of rapid change based on information released by regulatory bodies including the CDC and federal and state organizations. These policies and algorithms were followed during the  patient's care in the ED. ____________________________________________  FINAL CLINICAL IMPRESSION(S) / ED DIAGNOSES  Final diagnoses:  Fall at home, initial encounter  Acute midline low back pain without sciatica      Karmen Stabs, Charlesetta Ivory, PA-C 06/30/20 1816    Concha Se, MD 06/30/20 2022

## 2020-06-30 NOTE — ED Notes (Signed)
This RN updated pt's emergency contact- will send ride for safe d/c.

## 2020-06-30 NOTE — ED Triage Notes (Signed)
Pt comes into the ED via EMS from home with c/o fall, states she thought the bed was down low but is was raised up and she missed it fall to the floor.. pt c/o right lower back pain radiating into the right  Leg.

## 2020-07-04 ENCOUNTER — Telehealth: Payer: Self-pay

## 2020-07-04 ENCOUNTER — Ambulatory Visit: Payer: Medicaid Other

## 2020-07-04 NOTE — Telephone Encounter (Signed)
Called patient's aid in order to perform scheduled AWV. Aid stated that she sent patient to the office via transportation for the visit. I called the office and she never showed up. I spoke to aid again and she said that they never picked her up. She said that hs e will reschedule visit when she gets a time that she can be at the house with patient.

## 2020-08-01 ENCOUNTER — Ambulatory Visit (INDEPENDENT_AMBULATORY_CARE_PROVIDER_SITE_OTHER): Payer: 59

## 2020-08-01 VITALS — Ht 66.0 in | Wt 140.0 lb

## 2020-08-01 DIAGNOSIS — Z0000 Encounter for general adult medical examination without abnormal findings: Secondary | ICD-10-CM | POA: Diagnosis not present

## 2020-08-01 NOTE — Progress Notes (Addendum)
I connected with  [name] today via telehealth video enabled device and verified that I am speaking with the correct person using two identifiers.   Location: Patient: home Provider: home  Persons participating in virtual visit: [name], aid Agnes Lawrence LPN  I discussed the limitations, risks, security and privacy concerns of performing an evaluation and management service by video and the availability of in person appointments. The patient expressed understanding and agreed to proceed.   Some vital signs may be absent or patient reported.       Subjective:   [name] is a 64 y.o. female who presents for Medicare Annual (Subsequent) preventive examination.  Review of Systems     Cardiac Risk Factors include: sedentary lifestyle;smoking/ tobacco exposure     Objective:    Today's Vitals   08/01/20 1358 08/01/20 1400  Weight: 140 lb (63.5 kg)   Height: 5\' 6"  (1.676 m)   PainSc:  5    Body mass index is 22.6 kg/m.  Advanced Directives 08/01/2020 06/30/2020 05/25/2019 05/12/2018 04/13/2018 01/07/2018 10/28/2017  Does Patient Have a Medical Advance Directive? Yes No No Yes No Yes Yes  Type of 12/28/2017 of [place];Living will - - Healthcare Power of [place];Living will - Healthcare Power of [place];Living will Healthcare Power of [place];Living will  Does patient want to make changes to medical advance directive? - - - - - - No - Patient declined  Copy of Healthcare Power of Attorney in Chart? No - copy requested - - - - - No - copy requested  Would patient like information on creating a medical advance directive? - No - Patient declined - - - - -    Current Medications (verified) Outpatient Encounter Medications as of 08/01/2020  Medication Sig  . albuterol (PROVENTIL) (2.5 MG/3ML) 0.083% nebulizer solution Take 3 mLs (2.5 mg total) by nebulization every 6 (six) hours as needed for wheezing or shortness of breath.  09/29/2020 albuterol  (VENTOLIN HFA) 108 (90 Base) MCG/ACT inhaler USE 2 PUFFS EVERY SIX HOURS AS NEEDED FOR WHEEZING OR SHORTNESS OF BREATH.  [name] Kitchen alendronate (FOSAMAX) 70 MG tablet TAKE 1 TABLET BY MOUTH EVERY 7 (SEVEN) DAYS. TAKE WITH A FULL GLASS OF WATER ON AN EMPTY STOMACH.  . baclofen (LIORESAL) 10 MG tablet Take 1 tablet (10 mg total) by mouth at bedtime as needed for muscle spasms.  . Cholecalciferol 25 MCG (1000 UT) tablet Take by mouth.  . dalfampridine 10 MG TB12 Take 10 mg by mouth 2 (two) times daily.  [name] Kitchen dalfampridine 10 MG TB12 Take by mouth.  . Dimethyl Fumarate 240 MG CPDR Take 240 mg by mouth 2 (two) times daily.  [name] Kitchen gabapentin (NEURONTIN) 300 MG capsule Take 2 capsules (600mg ) AM and afternoon and 3 caps (900mg ) PM ordered by [facility] neurology  . mirabegron ER (MYRBETRIQ) 50 MG TB24 tablet Take 1 tablet (50 mg total) by mouth daily.  . Misc. Devices (CANE) MISC 1 each by Does not apply route as needed.  . pantoprazole (PROTONIX) 40 MG tablet Take 1 tablet (40 mg total) by mouth daily before breakfast.  . pravastatin (PRAVACHOL) 40 MG tablet Take 1 tablet (40 mg total) by mouth daily.  tiotropium (SPIRIVA HANDIHALER) 18 MCG inhalation capsule INHALE 1 CAPSULE VIA HANDIHALER ONCE DAILY AT THE SAME TIME EVERY DAY  . traMADol (ULTRAM) 50 MG tablet Take 1 tablet (50 mg total) by mouth at bedtime as needed for moderate pain. Last ordered by [facility]  No facility-administered encounter medications on file as of 08/01/2020.    Allergies (verified) Penicillins   History: Past Medical History:  Diagnosis Date  . Headache   . Hyperlipidemia   . MS (multiple sclerosis) (HCC) 1990  . Neurogenic bladder    Past Surgical History:  Procedure Laterality Date  . ABDOMINAL HYSTERECTOMY    . OOPHORECTOMY Bilateral    Family History  Problem Relation Age of Onset  . Diabetes Mother   . Heart disease Mother   . Diabetes Father   . Heart disease Father   . Diabetes Sister   . Heart disease  Sister    Social History   Socioeconomic History  . Marital status: Divorced    Spouse name: Not on file  . Number of children: 1  . Years of education: Not on file  . Highest education level: Some college, no degree  Occupational History  . Occupation: retired  Tobacco Use  . Smoking status: Current Every Day Smoker    Packs/day: 0.25    Years: 20.00    Pack years: 5.00    Types: Cigars    Last attempt to quit: 01/03/1995    Years since quitting: 25.5  . Smokeless tobacco: Never Used  . Tobacco comment: 2 cigarettes daily 08/01/2020  Vaping Use  . Vaping Use: Never used  Substance and Sexual Activity  . Alcohol use: No    Alcohol/week: 0.0 standard drinks  . Drug use: Yes    Comment: smokes pods in past  . Sexual activity: Not on file  Other Topics Concern  . Not on file  Social History Narrative  . Not on file   Social Determinants of Health   Financial Resource Strain: Low Risk   . Difficulty of Paying Living Expenses: Not hard at all  Food Insecurity: No Food Insecurity  . Worried About Programme researcher, broadcasting/film/video in the Last Year: Never true  . Ran Out of Food in the Last Year: Never true  Transportation Needs: No Transportation Needs  . Lack of Transportation (Medical): No  . Lack of Transportation (Non-Medical): No  Physical Activity: Inactive  . Days of Exercise per Week: 0 days  . Minutes of Exercise per Session: 0 min  Stress: No Stress Concern Present  . Feeling of Stress : Not at all  Social Connections: Not on file    Tobacco Counseling Ready to quit: Yes Counseling given: Not Answered Comment: 2 cigarettes daily 08/01/2020   Clinical Intake:  Pre-visit preparation completed: Yes  Pain : 0-10 Pain Score: 5  Pain Type: Chronic pain Pain Location:  (left side) Pain Orientation: Left Pain Descriptors / Indicators: Constant Pain Onset: More than a month ago Pain Frequency: Intermittent Pain Relieving Factors: rest  Pain Relieving Factors:  rest  Nutritional Status: BMI of 19-24  Normal Nutritional Risks: None  How often do you need to have someone help you when you read instructions, pamphlets, or other written materials from your doctor or pharmacy?: 1 - Never What is the last grade level you completed in school?: 12th grade  Diabetic? no  Interpreter Needed?: No  Information entered by :: NAllen LPN   Activities of Daily Living In your present state of health, do you have any difficulty performing the following activities: 08/01/2020  Hearing? N  Vision? Y  Difficulty concentrating or making decisions? N  Walking or climbing stairs? Y  Dressing or bathing? N  Doing errands, shopping? Y  Comment has someone with her  Preparing  Food and eating ? N  Using the Toilet? N  In the past six months, have you accidently leaked urine? Y  Do you have problems with loss of bowel control? N  Managing your Medications? Y  Managing your Finances? N  Housekeeping or managing your Housekeeping? N  Some recent data might be hidden    Patient Care Team: Smitty CordsKaramalegos, Alexander J, DO as PCP - General (Family Medicine) Morene CrockerPotter, Zachary E, MD as Referring Physician (Neurology) Minor, Theadora RamaJanci S, RN (Inactive) as Case Manager  Indicate any recent Medical Services you may have received from other than [facility] providers in the past year (date may be approximate).     Assessment:   This is a routine wellness examination for Vicki PoundDeborah.  Hearing/Vision screen No exam data present  Dietary issues and exercise activities discussed: Current Exercise Habits: The patient does not participate in regular exercise at present  Goals    . DIET - INCREASE WATER INTAKE     Recommend to drink at least 6-8 8oz glasses of water per day.     . Patient Stated     08/01/2020, no goals      Depression Screen PHQ 2/9 Scores 08/01/2020 05/25/2019 03/18/2019 08/19/2018 05/12/2018 02/12/2018 05/29/2015  PHQ - 2 Score 0 0 0 0 1 0 0  PHQ- 9 Score - - - 0 - 2 -     Fall Risk Fall Risk  08/01/2020 05/25/2019 03/18/2019 08/19/2018 05/12/2018  Falls in the past year? 1 0 0 0 0  Comment lost balance - - - -  Number falls in past yr: 0 0 - - -  Injury with Fall? 0 0 - - -  Risk for fall due to : Impaired balance/gait;Impaired mobility;Medication side effect - - - -  Follow up Falls evaluation completed;Education provided;Falls prevention discussed - Falls evaluation completed Falls evaluation completed -    FALL RISK PREVENTION PERTAINING TO THE HOME:  Any stairs in or around the home? No  If so, are there any without handrails? n/a Home free of loose throw rugs in walkways, pet beds, electrical cords, etc? Yes  Adequate lighting in your home to reduce risk of falls? Yes   ASSISTIVE DEVICES UTILIZED TO PREVENT FALLS:  Life alert? Yes  Use of a cane, walker or w/c? Yes  Grab bars in the bathroom? Yes  Shower chair or bench in shower? Yes  Elevated toilet seat or a handicapped toilet? No   TIMED UP AND GO:  Was the test performed? No .    Cognitive Function:     6CIT Screen 08/01/2020  What Year? 0 points  What month? 0 points  What time? 0 points  Count back from 20 4 points  Months in reverse 4 points  Repeat phrase 10 points  Total Score 18    Immunizations Immunization History  Administered Date(s) Administered  . Influenza, High Dose Seasonal PF 08/19/2018  . Influenza,inj,Quad PF,6+ Mos 03/04/2016, 02/25/2017, 04/11/2020  . Tdap 07/23/2016    TDAP status: Up to date  Flu Vaccine status: Up to date  Pneumococcal vaccine status: Up to date  Covid-19 vaccine status: Information provided on how to obtain vaccines.   Qualifies for Shingles Vaccine? Yes   Zostavax completed No   Shingrix Completed?: No.    Education has been provided regarding the importance of this vaccine. Patient has been advised to call insurance company to determine out of pocket expense if they have not yet received this vaccine. Advised may also  receive vaccine at local pharmacy or Health Dept. Verbalized acceptance and understanding.  Screening Tests Health Maintenance  Topic Date Due  . COVID-19 Vaccine (1) Never done  . MAMMOGRAM  Never done  . Fecal DNA (Cologuard)  Never done  . TETANUS/TDAP  07/23/2026  . INFLUENZA VACCINE  Completed  . Hepatitis C Screening  Completed  . HIV Screening  Completed  . PAP SMEAR-Modifier  Discontinued    Health Maintenance  Health Maintenance Due  Topic Date Due  . COVID-19 Vaccine (1) Never done  . MAMMOGRAM  Never done  . Fecal DNA (Cologuard)  Never done    Colorectal cancer screening: Type of screening: Colonoscopy. Completed 06/24/2013. Repeat every 10 years  Mammogram status: patient to schedule  Bone Density status: Completed 01/11/2015.   Lung Cancer Screening: (Low Dose CT Chest recommended if Age 21-80 years, 30 pack-year currently smoking OR have quit w/in 15years.) does not qualify.   Lung Cancer Screening Referral: no  Additional Screening:  Hepatitis C Screening: does qualify; Completed 02/12/2017  Vision Screening: Recommended annual ophthalmology exams for early detection of glaucoma and other disorders of the eye. Is the patient up to date with their annual eye exam?  No  Who is the provider or what is the name of the office in which the patient attends annual eye exams? [facility] If pt is not established with a provider, would they like to be referred to a provider to establish care? No .   Dental Screening: Recommended annual dental exams for proper oral hygiene  Community Resource Referral / Chronic Care Management: CRR required this visit?  No   CCM required this visit?  No      Plan:     I have personally reviewed and noted the following in the patient's chart:   . Medical and social history . Use of alcohol, tobacco or illicit drugs  . Current medications and supplements . Functional ability and status . Nutritional  status . Physical activity . Advanced directives . List of other physicians . Hospitalizations, surgeries, and ER visits in previous 12 months . Vitals . Screenings to include cognitive, depression, and falls . Referrals and appointments  In addition, I have reviewed and discussed with patient certain preventive protocols, quality metrics, and best practice recommendations. A written personalized care plan for preventive services as well as general preventive health recommendations were provided to patient.     Barb Merino, LPN   01/26/8849   Nurse Notes:

## 2020-08-01 NOTE — Patient Instructions (Signed)
[name] , Thank you for taking time to come for your Medicare Wellness Visit. I appreciate your ongoing commitment to your health goals. Please review the following plan we discussed and let me know if I can assist you in the future.   Screening recommendations/referrals: Colonoscopy: colonoscopy 06/24/2013 Mammogram: patient to schedule Bone Density: completed 01/11/2015 Recommended yearly ophthalmology/optometry visit for glaucoma screening and checkup Recommended yearly dental visit for hygiene and checkup  Vaccinations: Influenza vaccine: completed 04/11/2020, due 01/22/2021 Pneumococcal vaccine: n/a Tdap vaccine: completed 07/23/2016, due 07/23/2026 Shingles vaccine: discussed  Covid-19:  Declines at this time  Advanced directives: Please bring a copy of your POA (Power of [place]) and/or Living Will to your next appointment.   Conditions/risks identified: smoking  Next appointment: Follow up in one year for your annual wellness visit.   Preventive Care 40-64 Years, Female Preventive care refers to lifestyle choices and visits with your health care provider that can promote health and wellness. What does preventive care include?  A yearly physical exam. This is also called an annual well check.  Dental exams once or twice a year.  Routine eye exams. Ask your health care provider how often you should have your eyes checked.  Personal lifestyle choices, including:  Daily care of your teeth and gums.  Regular physical activity.  Eating a healthy diet.  Avoiding tobacco and drug use.  Limiting alcohol use.  Practicing safe sex.  Taking low-dose aspirin daily starting at age 69.  Taking vitamin and mineral supplements as recommended by your health care provider. What happens during an annual well check? The services and screenings done by your health care provider during your annual well check will depend on your age, overall health, lifestyle risk factors, and family  history of disease. Counseling  Your health care provider may ask you questions about your:  Alcohol use.  Tobacco use.  Drug use.  Emotional well-being.  Home and relationship well-being.  Sexual activity.  Eating habits.  Work and work Statistician.  Method of birth control.  Menstrual cycle.  Pregnancy history. Screening  You may have the following tests or measurements:  Height, weight, and BMI.  Blood pressure.  Lipid and cholesterol levels. These may be checked every 5 years, or more frequently if you are over 24 years old.  Skin check.  Lung cancer screening. You may have this screening every year starting at age 80 if you have a 30-pack-year history of smoking and currently smoke or have quit within the past 15 years.  Fecal occult blood test (FOBT) of the stool. You may have this test every year starting at age 10.  Flexible sigmoidoscopy or colonoscopy. You may have a sigmoidoscopy every 5 years or a colonoscopy every 10 years starting at age 37.  Hepatitis C blood test.  Hepatitis B blood test.  Sexually transmitted disease (STD) testing.  Diabetes screening. This is done by checking your blood sugar (glucose) after you have not eaten for a while (fasting). You may have this done every 1-3 years.  Mammogram. This may be done every 1-2 years. Talk to your health care provider about when you should start having regular mammograms. This may depend on whether you have a family history of breast cancer.  BRCA-related cancer screening. This may be done if you have a family history of breast, ovarian, tubal, or peritoneal cancers.  Pelvic exam and Pap test. This may be done every 3 years starting at age 17. Starting at age 75, this  may be done every 5 years if you have a Pap test in combination with an HPV test.  Bone density scan. This is done to screen for osteoporosis. You may have this scan if you are at high risk for osteoporosis. Discuss your test  results, treatment options, and if necessary, the need for more tests with your health care provider. Vaccines  Your health care provider may recommend certain vaccines, such as:  Influenza vaccine. This is recommended every year.  Tetanus, diphtheria, and acellular pertussis (Tdap, Td) vaccine. You may need a Td booster every 10 years.  Zoster vaccine. You may need this after age 2.  Pneumococcal 13-valent conjugate (PCV13) vaccine. You may need this if you have certain conditions and were not previously vaccinated.  Pneumococcal polysaccharide (PPSV23) vaccine. You may need one or two doses if you smoke cigarettes or if you have certain conditions. Talk to your health care provider about which screenings and vaccines you need and how often you need them. This information is not intended to replace advice given to you by your health care provider. Make sure you discuss any questions you have with your health care provider. Document Released: 07/07/2015 Document Revised: 02/28/2016 Document Reviewed: 04/11/2015 Elsevier Interactive Patient Education  2017 [place] Prevention in the Home Falls can cause injuries. They can happen to people of all ages. There are many things you can do to make your home safe and to help prevent falls. What can I do on the outside of my home?  Regularly fix the edges of walkways and driveways and fix any cracks.  Remove anything that might make you trip as you walk through a door, such as a raised step or threshold.  Trim any bushes or trees on the path to your home.  Use bright outdoor lighting.  Clear any walking paths of anything that might make someone trip, such as rocks or tools.  Regularly check to see if handrails are loose or broken. Make sure that both sides of any steps have handrails.  Any raised decks and porches should have guardrails on the edges.  Have any leaves, snow, or ice cleared regularly.  Use sand or salt on  walking paths during winter.  Clean up any spills in your garage right away. This includes oil or grease spills. What can I do in the bathroom?  Use night lights.  Install grab bars by the toilet and in the tub and shower. Do not use towel bars as grab bars.  Use non-skid mats or decals in the tub or shower.  If you need to sit down in the shower, use a plastic, non-slip stool.  Keep the floor dry. Clean up any water that spills on the floor as soon as it happens.  Remove soap buildup in the tub or shower regularly.  Attach bath mats securely with double-sided non-slip rug tape.  Do not have throw rugs and other things on the floor that can make you trip. What can I do in the bedroom?  Use night lights.  Make sure that you have a light by your bed that is easy to reach.  Do not use any sheets or blankets that are too big for your bed. They should not hang down onto the floor.  Have a firm chair that has side arms. You can use this for support while you get dressed.  Do not have throw rugs and other things on the floor that can make you  trip. What can I do in the kitchen?  Clean up any spills right away.  Avoid walking on wet floors.  Keep items that you use a lot in easy-to-reach places.  If you need to reach something above you, use a strong step stool that has a grab bar.  Keep electrical cords out of the way.  Do not use floor polish or wax that makes floors slippery. If you must use wax, use non-skid floor wax.  Do not have throw rugs and other things on the floor that can make you trip. What can I do with my stairs?  Do not leave any items on the stairs.  Make sure that there are handrails on both sides of the stairs and use them. Fix handrails that are broken or loose. Make sure that handrails are as [place] as the stairways.  Check any carpeting to make sure that it is firmly attached to the stairs. Fix any carpet that is loose or worn.  Avoid having throw  rugs at the top or bottom of the stairs. If you do have throw rugs, attach them to the floor with carpet tape.  Make sure that you have a light switch at the top of the stairs and the bottom of the stairs. If you do not have them, ask someone to add them for you. What else can I do to help prevent falls?  Wear shoes that:  Do not have high heels.  Have rubber bottoms.  Are comfortable and fit you well.  Are closed at the toe. Do not wear sandals.  If you use a stepladder:  Make sure that it is fully opened. Do not climb a closed stepladder.  Make sure that both sides of the stepladder are locked into place.  Ask someone to hold it for you, if possible.  Clearly mark and make sure that you can see:  Any grab bars or handrails.  First and last steps.  Where the edge of each step is.  Use tools that help you move around (mobility aids) if they are needed. These include:  Canes.  Walkers.  Scooters.  Crutches.  Turn on the lights when you go into a dark area. Replace any light bulbs as soon as they burn out.  Set up your furniture so you have a clear path. Avoid moving your furniture around.  If any of your floors are uneven, fix them.  If there are any pets around you, be aware of where they are.  Review your medicines with your doctor. Some medicines can make you feel dizzy. This can increase your chance of falling. Ask your doctor what other things that you can do to help prevent falls. This information is not intended to replace advice given to you by your health care provider. Make sure you discuss any questions you have with your health care provider. Document Released: 04/06/2009 Document Revised: 11/16/2015 Document Reviewed: 07/15/2014 Elsevier Interactive Patient Education  2017 [employer].

## 2020-09-21 ENCOUNTER — Other Ambulatory Visit: Payer: Self-pay | Admitting: Family Medicine

## 2020-09-21 DIAGNOSIS — E782 Mixed hyperlipidemia: Secondary | ICD-10-CM

## 2020-09-21 NOTE — Telephone Encounter (Signed)
Requested Prescriptions  Pending Prescriptions Disp Refills  . pravastatin (PRAVACHOL) 40 MG tablet [Pharmacy Med Name: PRAVASTATIN SODIUM 40 MG TAB] 90 tablet 0    Sig: TAKE 1 TABLET BY MOUTH EVERY DAY     Cardiovascular:  Antilipid - Statins Failed - 09/21/2020  2:02 AM      Failed - Total Cholesterol in normal range and within 360 days    Cholesterol, Total  Date Value Ref Range Status  01/06/2015 170 100 - 199 mg/dL Final   Cholesterol  Date Value Ref Range Status  02/12/2018 168 <200 mg/dL Final         Failed - LDL in normal range and within 360 days    LDL Cholesterol (Calc)  Date Value Ref Range Status  02/12/2018 93 mg/dL (calc) Final    Comment:    Reference range: <100 . Desirable range <100 mg/dL for primary prevention;   <70 mg/dL for patients with CHD or diabetic patients  with > or = 2 CHD risk factors. [name] Kitchen LDL-C is now calculated using the Martin-Hopkins  calculation, which is a validated novel method providing  better accuracy than the Friedewald equation in the  estimation of LDL-C.  Horald Pollen et al. Lenox Ahr. 0947;096(28): 2061-2068  (http://education.[employer].com/faq/FAQ164)          Failed - HDL in normal range and within 360 days    HDL  Date Value Ref Range Status  02/12/2018 58 >50 mg/dL Final  36/62/9476 52 >54 mg/dL Final    Comment:    According to ATP-III Guidelines, HDL-C >59 mg/dL is considered a negative risk factor for CHD.          Failed - Triglycerides in normal range and within 360 days    Triglycerides  Date Value Ref Range Status  02/12/2018 77 <150 mg/dL Final         Passed - Patient is not pregnant      Passed - Valid encounter within last 12 months    Recent Outpatient Visits          5 months ago Multiple sclerosis, relapsing-remitting (HCC)   [facility] Smitty Cords, DO   1 year ago Multiple sclerosis, relapsing-remitting [facility])   [facility] Smitty Cords,  DO   2 years ago Multiple sclerosis, relapsing-remitting [facility])   [facility] Smitty Cords, DO   2 years ago Neurogenic bladder   [facility] Smitty Cords, DO   3 years ago Lip swelling   [facility] Althea Charon, Netta Neat, DO      Future Appointments            In 3 weeks Althea Charon, Netta Neat, DO [facility]   In 10 months  [facility], [place]

## 2020-10-17 ENCOUNTER — Ambulatory Visit (INDEPENDENT_AMBULATORY_CARE_PROVIDER_SITE_OTHER): Payer: Medicare Other | Admitting: Family Medicine

## 2020-10-17 ENCOUNTER — Other Ambulatory Visit: Payer: Self-pay

## 2020-10-17 ENCOUNTER — Encounter: Payer: Self-pay | Admitting: Family Medicine

## 2020-10-17 VITALS — BP 120/75 | HR 78 | Temp 99.0°F | Ht 65.0 in | Wt 140.0 lb

## 2020-10-17 DIAGNOSIS — R7309 Other abnormal glucose: Secondary | ICD-10-CM

## 2020-10-17 DIAGNOSIS — E782 Mixed hyperlipidemia: Secondary | ICD-10-CM

## 2020-10-17 DIAGNOSIS — N319 Neuromuscular dysfunction of bladder, unspecified: Secondary | ICD-10-CM | POA: Diagnosis not present

## 2020-10-17 DIAGNOSIS — G894 Chronic pain syndrome: Secondary | ICD-10-CM | POA: Diagnosis not present

## 2020-10-17 DIAGNOSIS — K219 Gastro-esophageal reflux disease without esophagitis: Secondary | ICD-10-CM

## 2020-10-17 DIAGNOSIS — E559 Vitamin D deficiency, unspecified: Secondary | ICD-10-CM

## 2020-10-17 DIAGNOSIS — M62830 Muscle spasm of back: Secondary | ICD-10-CM | POA: Diagnosis not present

## 2020-10-17 DIAGNOSIS — J432 Centrilobular emphysema: Secondary | ICD-10-CM

## 2020-10-17 DIAGNOSIS — G35 Multiple sclerosis: Secondary | ICD-10-CM | POA: Diagnosis not present

## 2020-10-17 DIAGNOSIS — R32 Unspecified urinary incontinence: Secondary | ICD-10-CM

## 2020-10-17 DIAGNOSIS — Z1231 Encounter for screening mammogram for malignant neoplasm of breast: Secondary | ICD-10-CM | POA: Diagnosis not present

## 2020-10-17 MED ORDER — SPIRIVA HANDIHALER 18 MCG IN CAPS: ORAL_CAPSULE | RESPIRATORY_TRACT | 3 refills | Status: DC

## 2020-10-17 MED ORDER — PRAVASTATIN SODIUM 40 MG PO TABS: 40.0000 mg | ORAL_TABLET | Freq: Every day | ORAL | 3 refills | Status: DC

## 2020-10-17 MED ORDER — GABAPENTIN 300 MG PO CAPS: ORAL_CAPSULE | ORAL | 3 refills | Status: DC

## 2020-10-17 MED ORDER — MIRABEGRON ER 50 MG PO TB24: 50.0000 mg | ORAL_TABLET | Freq: Every day | ORAL | 3 refills | Status: DC

## 2020-10-17 MED ORDER — TRAMADOL HCL 50 MG PO TABS: 50.0000 mg | ORAL_TABLET | Freq: Every evening | ORAL | 5 refills | Status: DC | PRN

## 2020-10-17 MED ORDER — PANTOPRAZOLE SODIUM 40 MG PO TBEC: 40.0000 mg | DELAYED_RELEASE_TABLET | Freq: Every day | ORAL | 3 refills | Status: DC

## 2020-10-17 MED ORDER — CYCLOBENZAPRINE HCL 10 MG PO TABS: 10.0000 mg | ORAL_TABLET | Freq: Every evening | ORAL | 3 refills | Status: AC | PRN

## 2020-10-17 NOTE — Assessment & Plan Note (Signed)
Chronic relapsing remitting MS. Currently stable without flare Complicated with neurogenic bladder, see A&P [facility]ed [facility] Dr Malvin Johns Continue current MS targeted therapy per med list (Tecfidera, Ampyra) Completed PT  Pain management on Tramadol, Gabapentin - reviewed PDMP and refilled today  Discontinue Baclofen. Start Flexeril Continue Myrbetriq will refill today  Discussed with caregiver [name], that if cognitive decline continues she is pursuing new home aide assigment

## 2020-10-17 NOTE — Patient Instructions (Addendum)
Thank you for coming to the office today.  For Mammogram screening for breast cancer   Call the Imaging Center below anytime to schedule your own appointment now that order has been placed.  [facility] [address] [place], [place] [address] Phone: [phone]  All labs ordered today. Stay tuned.  Medications all ordered  Stop Baclofen muscle relaxant.  Start Cyclobenzapine (Flexeril) 10mg  tablets (muscle relaxant) - start with half (cut) to one whole pill at night for muscle relaxant - may make you sedated or sleepy (be careful driving or working on this) if tolerated you can take half to whole tab 2 to 3 times daily or every 8 hours as needed - prefer to only take in PM at bedtime if doing well, caution sleepy/sedated  Refilled Tramadol for as needed back pain or other pain.    Please schedule a Follow-up Appointment to: Return in about 6 months (around 04/18/2021) for 6 month follow-up Medication refills, updates MS Neurology.  If you have any other questions or concerns, please feel free to call the office or send a message through MyChart. You may also schedule an earlier appointment if necessary.  Additionally, you may be receiving a survey about your experience at our office within a few days to 1 week by e-mail or mail. We value your feedback.  04/20/2021, DO [facility], [facility]

## 2020-10-17 NOTE — Assessment & Plan Note (Signed)
Stable without exacerbation Improved on Spiriva - refill Continue Albuterol PRN

## 2020-10-17 NOTE — Assessment & Plan Note (Signed)
Check fasting lipid panel Refill PRavastatin

## 2020-10-17 NOTE — Assessment & Plan Note (Signed)
Stable chronic problem secondary to MS Followed by [facility] Neurology Check PDMP Managed on Tramadol, Gabapentin, Tylenol  Stop Baclofen. Switch to Flexeril

## 2020-10-17 NOTE — Progress Notes (Signed)
Subjective:    Patient ID: [name], female    DOB: 1955-08-26, 65 y.o.   MRN: [id number]  [name] is a 65 y.o. female presenting on 10/17/2020 for Multiple Sclerosis (6 month follow up. Fasting Labs today. Med refills. )   HPI  Here with home aid / friend that his helping her. She is waiting on upcoming reassignment.  Due for labs today  Multiple Sclerosis, Chronicrelapsing remitting/ Neurogenic Bladder/ Chronic Pain Syndrome [facility] Malvin JohnsPotter / Dot LanesAshley Stepp NP Last visit with me 03/2020 She has done well. Still has caregiver support. Awaiting on new reassignment for home aide in future. She continues with Home Health aide most days help with house work cleaning, grocery store and meal prep, medications.  Chronic Left leg drop, she has worked on this with PT. She uses rolling walker. She is able to ambulate using this, she does not drive, transportation brought her to apt today  She utilizes a walker for ambulation assistance at home and out of house. She uses Rollator Dan HumphreysWalker will need new order for DME today  Concern with some gradual cognitive decline still in setting of MS. No recent flare with MS For her pain she [facility], has had prior MRI Lumbar  - Regarding MS medications - Continue Tecfidera 240 mg twice a day, refilled. - Continue Ampyra 10 mg twice a day, refilled.  - For pain and function she takes the following: -Taking Tramadol 50mg  nightly PRN pain, refilled by Neuro in past. Today due we can re order today. - Taking Gabapentin 300mg  x 2 in AM, 2 in afternoon, and 3 PM - with improvement - Taking Baclofen 10mg  nightly PRN, needs refill - says no longer seems to be effective. Interested in switching med.  Difficulty getting out of bed at times, but once up she can function - Has issues with neurogenic bladder from MS, still issues with some overflow and urinary incontinence, she has urge  to go, now more frequently, improved on Myrbetriq50mg  daily, still on using pads / depends formedical supply for her urinary incontinence that is needed to help keep her dry and avoid recurrent UTI. She is doing better with hygiene and cleaning after soiling. Admits still episodic low back pain at times, worse at night, sore and stiff muscles, has tried muscle relaxant in past was helpful. Admits chronic L sided weakness, some episodes of intermittent swelling seem related to inactivity, improve with rest and elevation- seems improved Admits urinary incontinence Denies dysuria, fever chills, hematuria  Additional PMH - Centrilobular Emphysema - doing well needs refill on Spiriva - GERD - needs refill on pantoprazole, controlled  Health Maintenance:  Due for mammogram screening. She can call to schedule.  She declines Cologuard, previously ordered, never completed.  [facility] 10/17/2020 08/01/2020 05/25/2019  Decreased Interest 0 0 0  Down, Depressed, Hopeless 0 0 0  PHQ - 2 Score 0 0 0  Altered sleeping 0 - -  Tired, decreased energy 0 - -  Change in appetite 0 - -  Feeling bad or failure about yourself  0 - -  Trouble concentrating 1 - -  Moving slowly or fidgety/restless 0 - -  Suicidal thoughts 0 - -  PHQ-9 Score 1 - -  Difficult doing work/chores Not difficult at all - -    Social History   Tobacco Use  . Smoking status: Current Every Day Smoker    Packs/day: 0.25  Years: 20.00    Pack years: 5.00    Types: Cigars    Last attempt to quit: 01/03/1995    Years since quitting: 25.8  . Smokeless tobacco: Never Used  . Tobacco comment: 4 cigarettes daily 10/17/2020  Vaping Use  . Vaping Use: Never used  Substance Use Topics  . Alcohol use: No    Alcohol/week: 0.0 standard drinks  . Drug use: Yes    Comment: smokes pods in past    Review of Systems Per HPI unless specifically indicated above     Objective:    BP 120/75   Pulse 78   Temp 99  F (37.2 C) (Oral)   Ht 5\' 5"  (1.651 m)   Wt 140 lb (63.5 kg)   SpO2 100%   BMI 23.30 kg/m   Wt Readings from Last 3 Encounters:  10/17/20 140 lb (63.5 kg)  08/01/20 140 lb (63.5 kg)  06/30/20 152 lb (68.9 kg)    Physical Exam Vitals and nursing note reviewed.  Constitutional:      General: She is not in acute distress.    Appearance: She is well-developed. She is not diaphoretic.     Comments: Currently well today, mostly comfortable, cooperative.  HENT:     Head: Normocephalic and atraumatic.  Eyes:     General:        Right eye: No discharge.        Left eye: No discharge.     Conjunctiva/sclera: Conjunctivae normal.  Cardiovascular:     Rate and Rhythm: Normal rate and regular rhythm.     Heart sounds: Normal heart sounds. No murmur heard.   Pulmonary:     Effort: Pulmonary effort is normal. No respiratory distress.     Breath sounds: Normal breath sounds. No wheezing or rales.  Abdominal:     General: Bowel sounds are normal. There is no distension.     Palpations: Abdomen is soft.     Tenderness: There is no abdominal tenderness.  Musculoskeletal:     Cervical back: Normal range of motion and neck supple.     Comments: Low Back / Lower Extremity Inspection: Normal appearance, no spinal deformity, symmetrical. Palpation: No tenderness over spinous processes. Stable bilateral lower back muscle spasm and hypertonicity, seems R>L ROM: Good intact ROM lower extremity flex/ext legs, hips Strength: Bilateral knee flex/ext 5/5. IMPROVED chronic residual weakness now nearly 5/5 Left hip/knee/ankle, grip and upper ext intact 5/5 Neurovascular: intact distal sensation to light touch  Uses rollator walker  Skin:    General: Skin is warm and dry.     Findings: No erythema or rash.  Neurological:     Mental Status: She is alert and oriented to person, place, and time.  Psychiatric:        Behavior: Behavior normal.       Results for orders placed or performed in  visit on 02/12/18  Hemoglobin A1c  Result Value Ref Range   Hgb A1c MFr Bld 5.3 <5.7 % of total Hgb   Mean Plasma Glucose 105 (calc)   eAG (mmol/L) 5.8 (calc)  CBC with Differential/Platelet  Result Value Ref Range   WBC 3.8 3.8 - 10.8 Thousand/uL   RBC 3.91 3.80 - 5.10 Million/uL   Hemoglobin 13.2 11.7 - 15.5 g/dL   HCT 02/14/18 50.0 - 93.8 %   MCV 98.5 80.0 - 100.0 fL   MCH 33.8 (H) 27.0 - 33.0 pg   MCHC 34.3 32.0 - 36.0 g/dL   RDW  12.4 11.0 - 15.0 %   Platelets 194 140 - 400 Thousand/uL   MPV 10.5 7.5 - 12.5 fL   Neutro Abs 2,443 1,500 - 7,800 cells/uL   Lymphs Abs 847 (L) 850 - 3,900 cells/uL   WBC mixed population 437 200 - 950 cells/uL   Eosinophils Absolute 61 15 - 500 cells/uL   Basophils Absolute 11 0 - 200 cells/uL   Neutrophils Relative % 64.3 %   Total Lymphocyte 22.3 %   Monocytes Relative 11.5 %   Eosinophils Relative 1.6 %   Basophils Relative 0.3 %  COMPLETE METABOLIC PANEL WITH GFR  Result Value Ref Range   Glucose, Bld 86 65 - 99 mg/dL   BUN 8 7 - 25 mg/dL   Creat 7.20 9.47 - 0.96 mg/dL   GFR, Est Non African American 101 > OR = 60 mL/min/1.28m2   GFR, Est African American 117 > OR = 60 mL/min/1.52m2   BUN/Creatinine Ratio NOT APPLICABLE 6 - 22 (calc)   Sodium 143 135 - 146 mmol/L   Potassium 4.6 3.5 - 5.3 mmol/L   Chloride 105 98 - 110 mmol/L   CO2 34 (H) 20 - 32 mmol/L   Calcium 9.4 8.6 - 10.4 mg/dL   Total Protein 6.7 6.1 - 8.1 g/dL   Albumin 3.9 3.6 - 5.1 g/dL   Globulin 2.8 1.9 - 3.7 g/dL (calc)   AG Ratio 1.4 1.0 - 2.5 (calc)   Total Bilirubin 0.4 0.2 - 1.2 mg/dL   Alkaline phosphatase (APISO) 70 33 - 130 U/L   AST 11 10 - 35 U/L   ALT 9 6 - 29 U/L  Lipid panel  Result Value Ref Range   Cholesterol 168 <200 mg/dL   HDL 58 >28 mg/dL   Triglycerides 77 <366 mg/dL   LDL Cholesterol (Calc) 93 mg/dL (calc)   Total CHOL/HDL Ratio 2.9 <5.0 (calc)   Non-HDL Cholesterol (Calc) 110 <130 mg/dL (calc)  VITAMIN D 25 Hydroxy (Vit-D Deficiency, Fractures)   Result Value Ref Range   Vit D, 25-Hydroxy 33 30 - 100 ng/mL      Assessment & Plan:   Problem List Items Addressed This Visit    Vitamin D deficiency   Relevant Orders   VITAMIN D 25 Hydroxy (Vit-D Deficiency, Fractures)   Neurogenic bladder    Secondary to Multiple Sclerosis, with mixed overflow incontinence Followed by [facility] Not followed by Urology  Plan Continue Myrbetriq from 50 XL daily - max dose goal to reduce frequency - Indicated for incontinence supplies, pull-ups, continue to use regularly to prevent future UTI risk that would come with soiling. Will continue to treat bladder with Myrbetriq - Future may need Urology - discussed may need urodynamics or possible suprapubic in future if significant concern from MS      Relevant Medications   mirabegron ER (MYRBETRIQ) 50 MG TB24 tablet   Multiple sclerosis, relapsing-remitting (HCC) - Primary    Chronic relapsing remitting MS. Currently stable without flare Complicated with neurogenic bladder, see A&P [facility]ed [facility] Dr Malvin Johns Continue current MS targeted therapy per med list (Tecfidera, Ampyra) Completed PT  Pain management on Tramadol, Gabapentin - reviewed PDMP and refilled today  Discontinue Baclofen. Start Flexeril Continue Myrbetriq will refill today  Discussed with caregiver [name], that if cognitive decline continues she is pursuing new home aide assigment       Relevant Medications   traMADol (ULTRAM) 50 MG tablet   cyclobenzaprine (FLEXERIL) 10 MG tablet   gabapentin (NEURONTIN)  300 MG capsule   Other Relevant Orders   CBC with Differential/Platelet   COMPLETE METABOLIC PANEL WITH GFR   Mixed hyperlipidemia    Check fasting lipid panel Refill PRavastatin      Relevant Medications   pravastatin (PRAVACHOL) 40 MG tablet   Other Relevant Orders   Lipid panel   Incontinence in female   Relevant Medications   mirabegron ER (MYRBETRIQ) 50 MG TB24 tablet   GERD without esophagitis     Stable, controlled Refill Pantoprazole      Relevant Medications   pantoprazole (PROTONIX) 40 MG tablet   Chronic pain    Stable chronic problem secondary to MS Followed by [facility] Neurology Check PDMP Managed on Tramadol, Gabapentin, Tylenol  Stop Baclofen. Switch to Flexeril      Relevant Medications   traMADol (ULTRAM) 50 MG tablet   cyclobenzaprine (FLEXERIL) 10 MG tablet   gabapentin (NEURONTIN) 300 MG capsule   Centrilobular emphysema (HCC)    Stable without exacerbation Improved on Spiriva - refill Continue Albuterol PRN      Relevant Medications   tiotropium (SPIRIVA HANDIHALER) 18 MCG inhalation capsule    Other Visit Diagnoses    Muscle spasm of back       Relevant Medications   cyclobenzaprine (FLEXERIL) 10 MG tablet   Encounter for screening mammogram for malignant neoplasm of breast       Relevant Orders   MM DIGITAL SCREENING BILATERAL   Abnormal glucose       Relevant Orders   Hemoglobin A1c      Updated Health Maintenance information Ordered Mammogram, to be scheduled/called by patient Declines Colon screening Labs drawn today - f/u lab results. Encouraged improvement to lifestyle with diet and exercise Goal with some weight maintenance  Handwritten rx for Rollator Walker given to caregiver today   Meds ordered this encounter  Medications  . mirabegron ER (MYRBETRIQ) 50 MG TB24 tablet    Sig: Take 1 tablet (50 mg total) by mouth daily.    Dispense:  90 tablet    Refill:  3  . pantoprazole (PROTONIX) 40 MG tablet    Sig: Take 1 tablet (40 mg total) by mouth daily before breakfast.    Dispense:  90 tablet    Refill:  3  . tiotropium (SPIRIVA HANDIHALER) 18 MCG inhalation capsule    Sig: INHALE 1 CAPSULE VIA HANDIHALER ONCE DAILY AT THE SAME TIME EVERY DAY    Dispense:  90 capsule    Refill:  3  . traMADol (ULTRAM) 50 MG tablet    Sig: Take 1 tablet (50 mg total) by mouth at bedtime as needed for moderate pain.    Dispense:  30 tablet     Refill:  5  . cyclobenzaprine (FLEXERIL) 10 MG tablet    Sig: Take 1 tablet (10 mg total) by mouth at bedtime as needed for muscle spasms.    Dispense:  90 tablet    Refill:  3  . gabapentin (NEURONTIN) 300 MG capsule    Sig: Take 2 capsules (600mg ) AM and afternoon and 3 caps (900mg ) PM    Dispense:  540 capsule    Refill:  3  . pravastatin (PRAVACHOL) 40 MG tablet    Sig: Take 1 tablet (40 mg total) by mouth daily.    Dispense:  90 tablet    Refill:  3    Add extra refills on file.      Follow up plan: Return in about 6 months (around 04/18/2021)  for 6 month follow-up Medication refills, updates MS Neurology.   [name], DO [facility] 10/17/2020, 9:54 AM

## 2020-10-17 NOTE — Assessment & Plan Note (Signed)
Secondary to Multiple Sclerosis, with mixed overflow incontinence Followed by [facility] Neuro Not followed by Urology  Plan Continue Myrbetriq from 50 XL daily - max dose goal to reduce frequency - Indicated for incontinence supplies, pull-ups, continue to use regularly to prevent future UTI risk that would come with soiling. Will continue to treat bladder with Myrbetriq - Future may need Urology - discussed may need urodynamics or possible suprapubic in future if significant concern from MS

## 2020-10-17 NOTE — Assessment & Plan Note (Signed)
Stable, controlled Refill Pantoprazole

## 2020-10-18 LAB — COMPLETE METABOLIC PANEL WITH GFR
AG Ratio: 1.6 (calc) (ref 1.0–2.5)
ALT: 8 U/L (ref 6–29)
AST: 11 U/L (ref 10–35)
Albumin: 4.4 g/dL (ref 3.6–5.1)
Alkaline phosphatase (APISO): 64 U/L (ref 37–153)
BUN: 18 mg/dL (ref 7–25)
CO2: 28 mmol/L (ref 20–32)
Calcium: 9.2 mg/dL (ref 8.6–10.4)
Chloride: 108 mmol/L (ref 98–110)
Creat: 0.58 mg/dL (ref 0.50–0.99)
GFR, Est African American: 114 mL/min/{1.73_m2} (ref >=60)
GFR, Est Non African American: 98 mL/min/{1.73_m2} (ref >=60)
Globulin: 2.7 g/dL (calc) (ref 1.9–3.7)
Glucose, Bld: 81 mg/dL (ref 65–99)
Potassium: 3.8 mmol/L (ref 3.5–5.3)
Sodium: 145 mmol/L (ref 135–146)
Total Bilirubin: 0.4 mg/dL (ref 0.2–1.2)
Total Protein: 7.1 g/dL (ref 6.1–8.1)

## 2020-10-18 LAB — CBC WITH DIFFERENTIAL/PLATELET
Absolute Monocytes: 476 cells/uL (ref 200–950)
Basophils Absolute: 7 cells/uL (ref 0–200)
Basophils Relative: 0.1 %
Eosinophils Absolute: 27 cells/uL (ref 15–500)
Eosinophils Relative: 0.4 %
HCT: 41.3 % (ref 35.0–45.0)
Hemoglobin: 13.7 g/dL (ref 11.7–15.5)
Lymphs Abs: 1027 cells/uL (ref 850–3900)
MCH: 34.2 pg — ABNORMAL HIGH (ref 27.0–33.0)
MCHC: 33.2 g/dL (ref 32.0–36.0)
MCV: 103 fL — ABNORMAL HIGH (ref 80.0–100.0)
MPV: 10.4 fL (ref 7.5–12.5)
Monocytes Relative: 7 %
Neutro Abs: 5263 cells/uL (ref 1500–7800)
Neutrophils Relative %: 77.4 %
Platelets: 201 10*3/uL (ref 140–400)
RBC: 4.01 10*6/uL (ref 3.80–5.10)
RDW: 12.7 % (ref 11.0–15.0)
Total Lymphocyte: 15.1 %
WBC: 6.8 10*3/uL (ref 3.8–10.8)

## 2020-10-18 LAB — LIPID PANEL
Cholesterol: 169 mg/dL (ref <200)
HDL: 67 mg/dL (ref >=50)
LDL Cholesterol (Calc): 90 mg/dL (calc)
Non-HDL Cholesterol (Calc): 102 mg/dL (calc) (ref <130)
Total CHOL/HDL Ratio: 2.5 (calc) (ref <5.0)
Triglycerides: 41 mg/dL (ref <150)

## 2020-10-18 LAB — VITAMIN D 25 HYDROXY (VIT D DEFICIENCY, FRACTURES): Vit D, 25-Hydroxy: 21 ng/mL — ABNORMAL LOW (ref 30–100)

## 2020-10-18 LAB — HEMOGLOBIN A1C
Hgb A1c MFr Bld: 5.1 % of total Hgb (ref <5.7)
Mean Plasma Glucose: 100 mg/dL
eAG (mmol/L): 5.5 mmol/L

## 2020-11-09 ENCOUNTER — Telehealth: Payer: Self-pay

## 2020-11-09 DIAGNOSIS — L602 Onychogryphosis: Secondary | ICD-10-CM

## 2020-11-09 NOTE — Telephone Encounter (Signed)
Patient's caretaker called requesting a referral to podiatry concerning the pt's toenails that need to be clipped and looked at.

## 2020-11-23 ENCOUNTER — Ambulatory Visit (INDEPENDENT_AMBULATORY_CARE_PROVIDER_SITE_OTHER): Payer: Medicare Other | Admitting: Podiatry

## 2020-11-23 ENCOUNTER — Other Ambulatory Visit: Payer: Self-pay

## 2020-11-23 ENCOUNTER — Encounter: Payer: Self-pay | Admitting: Podiatry

## 2020-11-23 DIAGNOSIS — IMO0002 Reserved for concepts with insufficient information to code with codable children: Secondary | ICD-10-CM | POA: Insufficient documentation

## 2020-11-23 DIAGNOSIS — B373 Candidiasis of vulva and vagina: Secondary | ICD-10-CM | POA: Insufficient documentation

## 2020-11-23 DIAGNOSIS — I10 Essential (primary) hypertension: Secondary | ICD-10-CM | POA: Insufficient documentation

## 2020-11-23 DIAGNOSIS — K589 Irritable bowel syndrome without diarrhea: Secondary | ICD-10-CM | POA: Insufficient documentation

## 2020-11-23 DIAGNOSIS — E7800 Pure hypercholesterolemia, unspecified: Secondary | ICD-10-CM | POA: Insufficient documentation

## 2020-11-23 DIAGNOSIS — B351 Tinea unguium: Secondary | ICD-10-CM

## 2020-11-23 DIAGNOSIS — M79674 Pain in right toe(s): Secondary | ICD-10-CM

## 2020-11-23 DIAGNOSIS — B3731 Acute candidiasis of vulva and vagina: Secondary | ICD-10-CM | POA: Insufficient documentation

## 2020-11-23 DIAGNOSIS — Z7189 Other specified counseling: Secondary | ICD-10-CM | POA: Insufficient documentation

## 2020-11-23 DIAGNOSIS — M79675 Pain in left toe(s): Secondary | ICD-10-CM | POA: Diagnosis not present

## 2020-11-23 DIAGNOSIS — M51369 Other intervertebral disc degeneration, lumbar region without mention of lumbar back pain or lower extremity pain: Secondary | ICD-10-CM | POA: Insufficient documentation

## 2020-11-23 DIAGNOSIS — H548 Legal blindness, as defined in USA: Secondary | ICD-10-CM | POA: Insufficient documentation

## 2020-11-23 DIAGNOSIS — M5136 Other intervertebral disc degeneration, lumbar region: Secondary | ICD-10-CM | POA: Insufficient documentation

## 2020-11-23 DIAGNOSIS — R519 Headache, unspecified: Secondary | ICD-10-CM | POA: Insufficient documentation

## 2020-11-23 NOTE — Progress Notes (Signed)
  Subjective:  Patient ID: [name], female    DOB: 08-27-1955,  MRN: [id number]  Chief Complaint  Patient presents with  . Nail Problem    Patient presents today for thick dystrophic nails that are growning upwards and can be painful at times   65 y.o. female returns for the above complaint.  Patient presents with thickened elongated dystrophic toenails x10.  Painful to touch.  She would like to have it debrided and she is not able to do it herself.  She is not a diabetic.  She denies any other acute complaints.  Objective:  There were no vitals filed for this visit. Podiatric Exam: Vascular: dorsalis pedis and posterior tibial pulses are palpable bilateral. Capillary return is immediate. Temperature gradient is WNL. Skin turgor WNL  Sensorium: Normal Semmes Weinstein monofilament test. Normal tactile sensation bilaterally. Nail Exam: Pt has thick disfigured discolored nails with subungual debris noted bilateral entire nail hallux through fifth toenails.  Pain on palpation to the nails. Ulcer Exam: There is no evidence of ulcer or pre-ulcerative changes or infection. Orthopedic Exam: Muscle tone and strength are WNL. No limitations in general ROM. No crepitus or effusions noted. HAV  B/L.  Hammer toes 2-5  B/L. Skin: No Porokeratosis. No infection or ulcers    Assessment & Plan:  No diagnosis found.  Patient was evaluated and treated and all questions answered.  Onychomycosis with pain  -Nails palliatively debrided as below. -Educated on self-care  Procedure: Nail Debridement Rationale: pain  Type of Debridement: manual, sharp debridement. Instrumentation: Nail nipper, rotary burr. Number of Nails: 10  Procedures and Treatment: Consent by patient was obtained for treatment procedures. The patient understood the discussion of treatment and procedures well. All questions were answered thoroughly reviewed. Debridement of mycotic and hypertrophic toenails, 1 through 5 bilateral  and clearing of subungual debris. No ulceration, no infection noted.  Return Visit-Office Procedure: Patient instructed to return to the office for a follow up visit 3 months for continued evaluation and treatment.  Nicholes Rough, DPM    No follow-ups on file.

## 2020-12-06 ENCOUNTER — Telehealth: Payer: Self-pay | Admitting: Family Medicine

## 2020-12-06 NOTE — Telephone Encounter (Addendum)
Pharmacist , carrie called in states patient wants to change pharmacy. She needs all meds transferred and I asked the pharmacist which ones and she said all of them.    [facility] [id number], [place] [address].  [phone] fax

## 2020-12-07 DIAGNOSIS — G35 Multiple sclerosis: Secondary | ICD-10-CM | POA: Diagnosis not present

## 2020-12-13 ENCOUNTER — Other Ambulatory Visit: Payer: Self-pay

## 2020-12-13 DIAGNOSIS — K219 Gastro-esophageal reflux disease without esophagitis: Secondary | ICD-10-CM

## 2020-12-13 DIAGNOSIS — G35 Multiple sclerosis: Secondary | ICD-10-CM

## 2020-12-13 DIAGNOSIS — E782 Mixed hyperlipidemia: Secondary | ICD-10-CM

## 2020-12-13 DIAGNOSIS — G894 Chronic pain syndrome: Secondary | ICD-10-CM

## 2020-12-13 DIAGNOSIS — J432 Centrilobular emphysema: Secondary | ICD-10-CM

## 2020-12-13 DIAGNOSIS — N319 Neuromuscular dysfunction of bladder, unspecified: Secondary | ICD-10-CM

## 2020-12-13 DIAGNOSIS — M8589 Other specified disorders of bone density and structure, multiple sites: Secondary | ICD-10-CM

## 2020-12-13 DIAGNOSIS — R32 Unspecified urinary incontinence: Secondary | ICD-10-CM

## 2020-12-13 MED ORDER — ALENDRONATE SODIUM 70 MG PO TABS: ORAL_TABLET | ORAL | 3 refills | Status: DC

## 2020-12-13 MED ORDER — ALBUTEROL SULFATE HFA 108 (90 BASE) MCG/ACT IN AERS: 2.0000 | INHALATION_SPRAY | Freq: Four times a day (QID) | RESPIRATORY_TRACT | 3 refills | Status: DC | PRN

## 2020-12-13 MED ORDER — TRAMADOL HCL 50 MG PO TABS: 50.0000 mg | ORAL_TABLET | Freq: Every evening | ORAL | 5 refills | Status: DC | PRN

## 2020-12-13 MED ORDER — PRAVASTATIN SODIUM 40 MG PO TABS: 40.0000 mg | ORAL_TABLET | Freq: Every day | ORAL | 3 refills | Status: DC

## 2020-12-13 MED ORDER — SPIRIVA HANDIHALER 18 MCG IN CAPS: ORAL_CAPSULE | RESPIRATORY_TRACT | 3 refills | Status: DC

## 2020-12-13 MED ORDER — MIRABEGRON ER 50 MG PO TB24: 50.0000 mg | ORAL_TABLET | Freq: Every day | ORAL | 3 refills | Status: DC

## 2020-12-13 MED ORDER — GABAPENTIN 300 MG PO CAPS: ORAL_CAPSULE | ORAL | 3 refills | Status: DC

## 2020-12-13 MED ORDER — PANTOPRAZOLE SODIUM 40 MG PO TBEC: 40.0000 mg | DELAYED_RELEASE_TABLET | Freq: Every day | ORAL | 3 refills | Status: DC

## 2021-02-27 ENCOUNTER — Ambulatory Visit: Payer: Medicare Other | Admitting: Podiatry

## 2021-03-09 ENCOUNTER — Other Ambulatory Visit: Payer: Self-pay | Admitting: Family Medicine

## 2021-03-09 DIAGNOSIS — M8589 Other specified disorders of bone density and structure, multiple sites: Secondary | ICD-10-CM

## 2021-03-09 NOTE — Telephone Encounter (Signed)
Call to patient- patient aid-Wanda answered- advised did not receive medication through mail order- please forward to [facility]. Rx forwarded as requested.

## 2021-04-09 ENCOUNTER — Telehealth: Payer: Self-pay | Admitting: Family Medicine

## 2021-04-09 NOTE — Telephone Encounter (Signed)
[name] with [facility] called needing Clarification on three prescriptions.  Can some one please call her back  CB#  [phone]

## 2021-04-17 ENCOUNTER — Ambulatory Visit: Payer: 59 | Admitting: Family Medicine

## 2021-05-01 ENCOUNTER — Ambulatory Visit: Payer: 59 | Admitting: Family Medicine

## 2021-05-01 DIAGNOSIS — H539 Unspecified visual disturbance: Secondary | ICD-10-CM | POA: Diagnosis not present

## 2021-05-01 DIAGNOSIS — M545 Low back pain, unspecified: Secondary | ICD-10-CM | POA: Diagnosis not present

## 2021-05-01 DIAGNOSIS — G35 Multiple sclerosis: Secondary | ICD-10-CM | POA: Diagnosis not present

## 2021-05-01 DIAGNOSIS — G8929 Other chronic pain: Secondary | ICD-10-CM | POA: Diagnosis not present

## 2021-05-03 ENCOUNTER — Encounter: Payer: Self-pay | Admitting: Family Medicine

## 2021-05-03 ENCOUNTER — Other Ambulatory Visit: Payer: Self-pay

## 2021-05-03 ENCOUNTER — Ambulatory Visit (INDEPENDENT_AMBULATORY_CARE_PROVIDER_SITE_OTHER): Payer: Medicare Other | Admitting: Family Medicine

## 2021-05-03 VITALS — BP 118/62 | HR 70 | Ht 65.0 in | Wt 140.0 lb

## 2021-05-03 DIAGNOSIS — M62830 Muscle spasm of back: Secondary | ICD-10-CM | POA: Diagnosis not present

## 2021-05-03 DIAGNOSIS — N319 Neuromuscular dysfunction of bladder, unspecified: Secondary | ICD-10-CM | POA: Diagnosis not present

## 2021-05-03 DIAGNOSIS — G35 Multiple sclerosis: Secondary | ICD-10-CM | POA: Diagnosis not present

## 2021-05-03 DIAGNOSIS — G894 Chronic pain syndrome: Secondary | ICD-10-CM

## 2021-05-03 DIAGNOSIS — Z1231 Encounter for screening mammogram for malignant neoplasm of breast: Secondary | ICD-10-CM | POA: Diagnosis not present

## 2021-05-03 DIAGNOSIS — J432 Centrilobular emphysema: Secondary | ICD-10-CM

## 2021-05-03 DIAGNOSIS — Z23 Encounter for immunization: Secondary | ICD-10-CM

## 2021-05-03 MED ORDER — TRAMADOL HCL 50 MG PO TABS: 50.0000 mg | ORAL_TABLET | Freq: Every evening | ORAL | 5 refills | Status: DC | PRN

## 2021-05-03 MED ORDER — ALBUTEROL SULFATE HFA 108 (90 BASE) MCG/ACT IN AERS: 2.0000 | INHALATION_SPRAY | Freq: Four times a day (QID) | RESPIRATORY_TRACT | 5 refills | Status: DC | PRN

## 2021-05-03 NOTE — Patient Instructions (Addendum)
Thank you for coming to the office today.  If taking the new rx Diclofenac, you should AVOID OTC meds Ibuprofen, Motrin, Advil, Aleve.  Okay to take: includes Tramadol, Gabapentin, Flexeril (Cyclobenzaprine), Tylenol.  Refilled Tramadol  Refilled Albuterol  For Mammogram screening for breast cancer   Call the Imaging Center below anytime to schedule your own appointment now that order has been placed.  [facility] [address] [place], [place] [address] Phone: [phone]   DUE for FASTING BLOOD WORK (no food or drink after midnight before the lab appointment, only water or coffee without cream/sugar on the morning of)  SCHEDULE "Lab Only" visit in the morning at the clinic for lab draw in 6 MONTHS   - Make sure Lab Only appointment is at about 1 week before your next appointment, so that results will be available  For Lab Results, once available within 2-3 days of blood draw, you can can log in to MyChart online to view your results and a brief explanation. Also, we can discuss results at next follow-up visit.    Please schedule a Follow-up Appointment to: Return in about 6 months (around 10/31/2021) for 6 month Yearly Medicare Checkup AM fasting lab only AFTER.  If you have any other questions or concerns, please feel free to call the office or send a message through MyChart. You may also schedule an earlier appointment if necessary.  Additionally, you may be receiving a survey about your experience at our office within a few days to 1 week by e-mail or mail. We value your feedback.  [name], DO [facility], [place]

## 2021-05-03 NOTE — Assessment & Plan Note (Signed)
Chronic relapsing remitting MS. Currently stable without flare Complicated with neurogenic bladder, see A&P Followed [facility] Dr Malvin Johns Continue current MS targeted therapy per med list (Tecfidera, Ampyra) Completed PT  Pain management on Tramadol, Gabapentin - reviewed PDMP and refilled today  Continue Flexeril Tramadol Gabapentin Continue Myrbetriq  Discussed with caregiver [name], that if cognitive decline continues she is pursuing new home aide assigment

## 2021-05-03 NOTE — Assessment & Plan Note (Signed)
Secondary to Multiple Sclerosis, with mixed overflow incontinence Followed by [facility] Neuro Not followed by Urology  Plan Continue Myrbetriq from 50 XL daily - max dose goal to reduce frequency - Indicated for incontinence supplies, pull-ups, continue to use regularly to prevent future UTI risk that would come with soiling. Will continue to treat bladder with Myrbetriq - Future may need Urology - discussed may need urodynamics or possible suprapubic in future if significant concern from MS

## 2021-05-03 NOTE — Assessment & Plan Note (Signed)
Stable without exacerbation Restarted smoking - goal to quit Improved on Spiriva Continue Albuterol PRN Refill

## 2021-05-03 NOTE — Progress Notes (Addendum)
Subjective:    Patient ID: [name], female    DOB: Nov 03, 1955, 65 y.o.   MRN: [id number]  [name] is a 65 y.o. female presenting on 05/03/2021 for Multiple Sclerosis    HPI  Here with home aid / friend caregiver.   Multiple Sclerosis, Chronic relapsing remitting / Neurogenic Bladder / Chronic Pain Syndrome Followed by [facility] Dr Malvin Johns  Last visit with me 09/2020 She has done well. Still has caregiver support. Home aide She continues with Home Health aide most days help with house work cleaning, grocery store and meal prep, medications.   Chronic Left leg drop, she has worked on this with PT. She uses rolling walker. She is able to ambulate using this, she does not drive, transportation brought her to apt today   She utilizes a walker for ambulation assistance at home and out of house. She uses Rollator Walker Declines back brace, was not requested by patient. She can use medicare to get DME if need in future.   Concern with some gradual cognitive decline still in setting of MS. No recent flare with MS For her pain she was referred to [facility] pain management, has had prior MRI Lumbar   - Regarding MS medications Continues on medication management.   - For pain and function she takes the following: - Taking Tramadol 50mg  nightly PRN pain, refilled by Neuro in past. Today due we can re order today. - Taking Gabapentin 300mg  x 2 in AM, 2 in afternoon, and 3 PM - with improvement - Taking Baclofen 10mg  nightly PRN, needs refill - says no longer seems to be effective. Interested in switching med.   Difficulty getting out of bed at times, but once up she can function - Has issues with neurogenic bladder from MS, still issues with some overflow and urinary incontinence, she has urge to go, now more frequently, improved on Myrbetriq 50mg  daily, still on using pads / depends for medical supply for her urinary incontinence that is needed to help keep her dry and  avoid recurrent UTI. She is doing better with hygiene and cleaning after soiling. Admits still episodic low back pain at times, worse at night, sore and stiff muscles, has tried muscle relaxant in past was helpful. Admits chronic L sided weakness, some episodes of intermittent swelling seem related to inactivity, improve with rest and elevation - seems improved Admits urinary incontinence Denies dysuria, fever chills, hematuria   Additional PMH - Centrilobular Emphysema - doing well needs refill on Spiriva, Albuterol - GERD - needs refill on pantoprazole, controlled   Health Maintenance:  Due for Flu Shot, will receive today    Due for mammogram screening. She can call to schedule.   She declines Cologuard, previously ordered, never completed.  Depression screen [facility] 2/9 10/17/2020 08/01/2020 05/25/2019  Decreased Interest 0 0 0  Down, Depressed, Hopeless 0 0 0  PHQ - 2 Score 0 0 0  Altered sleeping 0 - -  Tired, decreased energy 0 - -  Change in appetite 0 - -  Feeling bad or failure about yourself  0 - -  Trouble concentrating 1 - -  Moving slowly or fidgety/restless 0 - -  Suicidal thoughts 0 - -  PHQ-9 Score 1 - -  Difficult doing work/chores Not difficult at all - -  Some recent data might be hidden    Social History   Tobacco Use   Smoking status: Every Day    Packs/day: 0.25  Years: 20.00    Pack years: 5.00    Types: Cigars, Cigarettes    Last attempt to quit: 01/03/1995    Years since quitting: 26.4   Smokeless tobacco: Never   Tobacco comments:    4 cigarettes daily 10/17/2020  Vaping Use   Vaping Use: Never used  Substance Use Topics   Alcohol use: No    Alcohol/week: 0.0 standard drinks   Drug use: Yes    Comment: smokes pods in past    Review of Systems Per HPI unless specifically indicated above     Objective:    BP 118/62   Pulse 70   Ht 5\' 5"  (1.651 m)   Wt 140 lb (63.5 kg)   SpO2 100%   BMI 23.30 kg/m   Wt Readings from Last 3  Encounters:  05/03/21 140 lb (63.5 kg)  10/17/20 140 lb (63.5 kg)  08/01/20 140 lb (63.5 kg)    Physical Exam Vitals and nursing note reviewed.  Constitutional:      General: She is not in acute distress.    Appearance: She is well-developed. She is not diaphoretic.     Comments: Currently well today, mostly comfortable, cooperative.  HENT:     Head: Normocephalic and atraumatic.  Eyes:     General:        Right eye: No discharge.        Left eye: No discharge.     Conjunctiva/sclera: Conjunctivae normal.  Cardiovascular:     Rate and Rhythm: Normal rate and regular rhythm.     Heart sounds: Normal heart sounds. No murmur heard. Pulmonary:     Effort: Pulmonary effort is normal. No respiratory distress.     Breath sounds: Normal breath sounds. No wheezing or rales.  Abdominal:     General: Bowel sounds are normal. There is no distension.     Palpations: Abdomen is soft.     Tenderness: There is no abdominal tenderness.  Musculoskeletal:     Cervical back: Normal range of motion and neck supple.     Comments: Low Back / Lower Extremity Inspection: Normal appearance, no spinal deformity, symmetrical. Palpation: No tenderness over spinous processes. Stable bilateral lower back muscle spasm and hypertonicity, seems R>L ROM: Good intact ROM lower extremity flex/ext legs, hips Strength: Bilateral knee flex/ext 5/5. IMPROVED chronic residual weakness now nearly 5/5 Left hip/knee/ankle, grip and upper ext intact 5/5 Neurovascular: intact distal sensation to light touch  Uses rollator walker  Skin:    General: Skin is warm and dry.     Findings: No erythema or rash.  Neurological:     Mental Status: She is alert and oriented to person, place, and time.  Psychiatric:        Behavior: Behavior normal.     Results for orders placed or performed in visit on 10/17/20  CBC with Differential/Platelet  Result Value Ref Range   WBC 6.8 3.8 - 10.8 Thousand/uL   RBC 4.01 3.80 - 5.10  Million/uL   Hemoglobin 13.7 11.7 - 15.5 g/dL   HCT 18.5 90.9 - 31.1 %   MCV 103.0 (H) 80.0 - 100.0 fL   MCH 34.2 (H) 27.0 - 33.0 pg   MCHC 33.2 32.0 - 36.0 g/dL   RDW 21.6 24.4 - 69.5 %   Platelets 201 140 - 400 Thousand/uL   MPV 10.4 7.5 - 12.5 fL   Neutro Abs 5,263 1,500 - 7,800 cells/uL   Lymphs Abs 1,027 850 - 3,900 cells/uL  Absolute Monocytes 476 200 - 950 cells/uL   Eosinophils Absolute 27 15 - 500 cells/uL   Basophils Absolute 7 0 - 200 cells/uL   Neutrophils Relative % 77.4 %   Total Lymphocyte 15.1 %   Monocytes Relative 7.0 %   Eosinophils Relative 0.4 %   Basophils Relative 0.1 %  COMPLETE METABOLIC PANEL WITH GFR  Result Value Ref Range   Glucose, Bld 81 65 - 99 mg/dL   BUN 18 7 - 25 mg/dL   Creat 0.58 0.50 - 0.99 mg/dL   GFR, Est Non African American 98 > OR = 60 mL/min/1.35m2   GFR, Est African American 114 > OR = 60 mL/min/1.50m2   BUN/Creatinine Ratio NOT APPLICABLE 6 - 22 (calc)   Sodium 145 135 - 146 mmol/L   Potassium 3.8 3.5 - 5.3 mmol/L   Chloride 108 98 - 110 mmol/L   CO2 28 20 - 32 mmol/L   Calcium 9.2 8.6 - 10.4 mg/dL   Total Protein 7.1 6.1 - 8.1 g/dL   Albumin 4.4 3.6 - 5.1 g/dL   Globulin 2.7 1.9 - 3.7 g/dL (calc)   AG Ratio 1.6 1.0 - 2.5 (calc)   Total Bilirubin 0.4 0.2 - 1.2 mg/dL   Alkaline phosphatase (APISO) 64 37 - 153 U/L   AST 11 10 - 35 U/L   ALT 8 6 - 29 U/L  Lipid panel  Result Value Ref Range   Cholesterol 169 <200 mg/dL   HDL 67 > OR = 50 mg/dL   Triglycerides 41 <150 mg/dL   LDL Cholesterol (Calc) 90 mg/dL (calc)   Total CHOL/HDL Ratio 2.5 <5.0 (calc)   Non-HDL Cholesterol (Calc) 102 <130 mg/dL (calc)  Hemoglobin A1c  Result Value Ref Range   Hgb A1c MFr Bld 5.1 <5.7 % of total Hgb   Mean Plasma Glucose 100 mg/dL   eAG (mmol/L) 5.5 mmol/L  VITAMIN D 25 Hydroxy (Vit-D Deficiency, Fractures)  Result Value Ref Range   Vit D, 25-Hydroxy 21 (L) 30 - 100 ng/mL      Assessment & Plan:   Problem List Items Addressed This  Visit     Neurogenic bladder    Secondary to Multiple Sclerosis, with mixed overflow incontinence Followed by [facility] Not followed by Urology  Plan Continue Myrbetriq from 50 XL daily - max dose goal to reduce frequency - Indicated for incontinence supplies, pull-ups, continue to use regularly to prevent future UTI risk that would come with soiling. Will continue to treat bladder with Myrbetriq - Future may need Urology - discussed may need urodynamics or possible suprapubic in future if significant concern from MS      Multiple sclerosis, relapsing-remitting ([place]) - Primary    Chronic relapsing remitting MS. Currently stable without flare Complicated with neurogenic bladder, see A&P Followed by [facility] Dr Melrose Nakayama Continue current MS targeted therapy per med list (Tecfidera, Ampyra) Completed PT  Pain management on Tramadol, Gabapentin - reviewed PDMP and refilled today  Continue Flexeril Tramadol Gabapentin Continue Myrbetriq  Discussed with caregiver [name], that if cognitive decline continues she is pursuing new home aide assigment       Relevant Medications   traMADol (ULTRAM) 50 MG tablet   Chronic pain   Relevant Medications   traMADol (ULTRAM) 50 MG tablet   Centrilobular emphysema (HCC)    Stable without exacerbation Restarted smoking - goal to quit Improved on Spiriva Continue Albuterol PRN Refill      Other Visit Diagnoses  Needs flu shot       Relevant Orders   Flu Vaccine QUAD 80mo+IM (Fluarix, Fluzone & Alfiuria Quad PF) (Completed)   Muscle spasm of back       Encounter for screening mammogram for malignant neoplasm of breast       Relevant Orders   MM 3D SCREEN BREAST BILATERAL       Orders Placed This Encounter  Procedures   MM 3D SCREEN BREAST BILATERAL    Standing Status:   Future    Standing Expiration Date:   05/03/2022    Order Specific Question:   Reason for Exam (SYMPTOM  OR DIAGNOSIS REQUIRED)    Answer:   Screening  bilateral 3D Mammogram Tomo    Order Specific Question:   Preferred imaging location?    Answer:   [facility]   Flu Vaccine QUAD 2mo+IM (Fluarix, Fluzone & Alfiuria Quad PF)     Meds ordered this encounter  Medications   traMADol (ULTRAM) 50 MG tablet    Sig: Take 1 tablet (50 mg total) by mouth at bedtime as needed for moderate pain.    Dispense:  30 tablet    Refill:  5   DISCONTD: albuterol (VENTOLIN HFA) 108 (90 Base) MCG/ACT inhaler    Sig: Inhale 2 puffs into the lungs every 6 (six) hours as needed for wheezing or shortness of breath.    Dispense:  8.5 g    Refill:  5    Follow up plan: Return in about 6 months (around 10/31/2021) for 6 month Yearly Medicare Checkup AM fasting lab only AFTER.   Nobie Putnam, DO [facility] 05/03/2021, 10:07 AM

## 2021-05-22 ENCOUNTER — Other Ambulatory Visit: Payer: Self-pay | Admitting: Family Medicine

## 2021-05-22 DIAGNOSIS — J432 Centrilobular emphysema: Secondary | ICD-10-CM

## 2021-05-22 MED ORDER — ALBUTEROL SULFATE HFA 108 (90 BASE) MCG/ACT IN AERS: 2.0000 | INHALATION_SPRAY | Freq: Four times a day (QID) | RESPIRATORY_TRACT | 3 refills | Status: DC | PRN

## 2021-05-31 ENCOUNTER — Ambulatory Visit: Payer: Medicare Other

## 2021-07-06 ENCOUNTER — Other Ambulatory Visit: Payer: Self-pay | Admitting: Family Medicine

## 2021-07-06 DIAGNOSIS — G35 Multiple sclerosis: Secondary | ICD-10-CM

## 2021-07-06 DIAGNOSIS — M62830 Muscle spasm of back: Secondary | ICD-10-CM

## 2021-07-06 DIAGNOSIS — G894 Chronic pain syndrome: Secondary | ICD-10-CM

## 2021-07-06 MED ORDER — TRAMADOL HCL 50 MG PO TABS: 50.0000 mg | ORAL_TABLET | Freq: Every evening | ORAL | 5 refills | Status: DC | PRN

## 2021-07-06 NOTE — Telephone Encounter (Signed)
Requested medication (s) are due for refill today: Yes  Requested medication (s) are on the active medication list: Yes  Last refill:  Tramadol 05/03/21  Future visit scheduled: No  Notes to clinic:  See requests.    Requested Prescriptions  Pending Prescriptions Disp Refills   traMADol (ULTRAM) 50 MG tablet 30 tablet 5    Sig: Take 1 tablet (50 mg total) by mouth at bedtime as needed for moderate pain.     Not Delegated - Analgesics:  Opioid Agonists Failed - 07/06/2021 12:46 PM      Failed - This refill cannot be delegated      Failed - Urine Drug Screen completed in last 360 days      Passed - Valid encounter within last 6 months    Recent Outpatient Visits           2 months ago Multiple sclerosis, relapsing-remitting (HCC)   [facility] Smitty Cords, DO   8 months ago Multiple sclerosis, relapsing-remitting [facility])   [facility] Smitty Cords, DO   1 year ago Multiple sclerosis, relapsing-remitting [facility])   [facility] Smitty Cords, DO   2 years ago Multiple sclerosis, relapsing-remitting [facility])   [facility] Smitty Cords, DO   2 years ago Multiple sclerosis, relapsing-remitting [facility])   [facility] Smitty Cords, DO       Future Appointments             In 1 month [facility]              Dimethyl Fumarate 240 MG CPDR 60 capsule     Sig: Take 1 capsule (240 mg total) by mouth 2 (two) times daily.     Off-Protocol Failed - 07/06/2021 12:46 PM      Failed - Medication not assigned to a protocol, review manually.      Passed - Valid encounter within last 12 months    Recent Outpatient Visits           2 months ago Multiple sclerosis, relapsing-remitting (HCC)   [facility] Smitty Cords, DO   8 months ago Multiple sclerosis, relapsing-remitting [facility])   [facility] Smitty Cords, DO   1 year ago Multiple sclerosis, relapsing-remitting [facility])   [facility] Smitty Cords, DO   2 years ago Multiple sclerosis, relapsing-remitting [facility])   [facility] Smitty Cords, DO   2 years ago Multiple sclerosis, relapsing-remitting [facility])   [facility] Smitty Cords, DO       Future Appointments             In 1 month [facility]              baclofen (LIORESAL) 10 MG tablet 90 tablet 1    Sig: Take 1 tablet (10 mg total) by mouth at bedtime as needed for muscle spasms.     Not Delegated - Analgesics:  Muscle Relaxants Failed - 07/06/2021 12:46 PM      Failed - This refill cannot be delegated      Passed - Valid encounter within last 6 months    Recent Outpatient Visits           2 months ago Multiple sclerosis, relapsing-remitting [facility])   [facility], Netta Neat, DO   8  months ago Multiple sclerosis, relapsing-remitting [facility])   [facility] Smitty Cords, DO   1 year ago Multiple sclerosis, relapsing-remitting [facility])   [facility] Smitty Cords, DO   2 years ago Multiple sclerosis, relapsing-remitting [facility])   [facility] Smitty Cords, DO   2 years ago Multiple sclerosis, relapsing-remitting [facility])   [facility] Smitty Cords, DO       Future Appointments             In 1 month [place], [facility]

## 2021-07-06 NOTE — Telephone Encounter (Signed)
Medication Refill - Medication: traMADol (ULTRAM) 50 MG tablettraMADol (ULTRAM) 50 MG tablet baclofen (LIORESAL) 10 MG tablet  Dimethyl Fumarate 240 MG CPDR  Has the patient contacted their pharmacy? No. Pharmacy calling to request transfer.   (Agent: If no, request that the patient contact the pharmacy for the refill. If patient does not wish to contact the pharmacy document the reason why and proceed with request.)   Preferred Pharmacy (with phone number or street name):   [facility] - [place], [address] [place]  [place] [address]  Phone: [phone] Fax: [phone]  Hours: Not open 24 hours    Has the patient been seen for an appointment in the last year OR does the patient have an upcoming appointment? Yes.    Agent: Please be advised that RX refills may take up to 3 business days. We ask that you follow-up with your pharmacy.

## 2021-07-10 ENCOUNTER — Ambulatory Visit: Payer: Medicaid Other

## 2021-07-19 ENCOUNTER — Other Ambulatory Visit: Payer: Self-pay | Admitting: Family Medicine

## 2021-07-19 DIAGNOSIS — G35 Multiple sclerosis: Secondary | ICD-10-CM

## 2021-07-19 DIAGNOSIS — G894 Chronic pain syndrome: Secondary | ICD-10-CM

## 2021-07-19 NOTE — Telephone Encounter (Signed)
Requested medication (s) are due for refill today: Yes  Requested medication (s) are on the active medication list: Yes  Last refill:  2017 and Tramadol 07/06/21  Future visit scheduled:Yes  Notes to clinic:  Unable to refill per protocol, cannot delegate, send to a new mail order pharmacy      Requested Prescriptions  Pending Prescriptions Disp Refills   Dimethyl Fumarate 240 MG CPDR 60 capsule     Sig: Take 1 capsule (240 mg total) by mouth 2 (two) times daily.     Off-Protocol Failed - 07/19/2021  1:31 PM      Failed - Medication not assigned to a protocol, review manually.      Passed - Valid encounter within last 12 months    Recent Outpatient Visits           2 months ago Multiple sclerosis, relapsing-remitting ([place])   [facility] [name], DO   9 months ago Multiple sclerosis, relapsing-remitting [facility])   [facility] [name], DO   1 year ago Multiple sclerosis, relapsing-remitting [facility])   [facility] [name], DO   2 years ago Multiple sclerosis, relapsing-remitting [facility])   [facility] [name], DO   2 years ago Multiple sclerosis, relapsing-remitting [facility])   [place], DO       Future Appointments             In 3 weeks [facility]              traMADol (ULTRAM) 50 MG tablet 30 tablet 5    Sig: Take 1 tablet (50 mg total) by mouth at bedtime as needed for moderate pain.     Not Delegated - Analgesics:  Opioid Agonists Failed - 07/19/2021  1:31 PM      Failed - This refill cannot be delegated      Failed - Urine Drug Screen completed in last 360 days      Passed - Valid encounter within last 6 months    Recent Outpatient Visits           2 months ago Multiple sclerosis, relapsing-remitting ([place])   [place], DO   9  months ago Multiple sclerosis, relapsing-remitting [facility])   [place], DO   1 year ago Multiple sclerosis, relapsing-remitting [facility])   [place], DO   2 years ago Multiple sclerosis, relapsing-remitting [facility])   [facility] [name], DO   2 years ago Multiple sclerosis, relapsing-remitting [facility])   [facility] [name], DO       Future Appointments             In 3 weeks [facility]

## 2021-07-19 NOTE — Telephone Encounter (Signed)
Can you notify patient / caregiver of the following  Tramadol was refilled already to local [facility] about 2 weeks ago 07/06/21. I cannot send this to mail order.  The other medication Dimethyl Fumarate is a MS medication that I do not prescribe. It will need to come from her Neurologist.   This is a repeat request, I have already denied it multiple times.  Nobie Putnam, [facility] 07/19/2021, 4:54 PM

## 2021-07-19 NOTE — Telephone Encounter (Signed)
Patient called, left VM to return the call to the office. I called her home and spoke to the patient, she says she didn't call and doesn't know about her medications, her assistant takes care of it for her. The pharmacy called on behalf of the patient for refills. [facility] called and spoke to Sarah, Education administrator, advised Baclofen is no longer on the patient's current medication list that it was discontinued. She says she will make a note and asked if the others will be e-scribed, I advised I will send to the provider to do that.

## 2021-07-19 NOTE — Telephone Encounter (Signed)
Medication Refill - Medication: Dimethyl Fumarate 240 MG CPDR  traMADol (ULTRAM) 50 MG tablet  baclofen (LIORESAL) tablet 10 mg  Has the patient contacted their pharmacy? Yes.   (Agent: If no, request that the patient contact the pharmacy for the refill. If patient does not wish to contact the pharmacy document the reason why and proceed with request.) (Agent: If yes, when and what did the pharmacy advise?)  Preferred Pharmacy (with phone number or street name):  [facility] - [place], [place]  [place] [address]  Phone: [phone] Fax: [phone]   Has the patient been seen for an appointment in the last year OR does the patient have an upcoming appointment? Yes.    Agent: Please be advised that RX refills may take up to 3 business days. We ask that you follow-up with your pharmacy.

## 2021-07-26 DIAGNOSIS — G35 Multiple sclerosis: Secondary | ICD-10-CM | POA: Diagnosis not present

## 2021-07-26 DIAGNOSIS — E559 Vitamin D deficiency, unspecified: Secondary | ICD-10-CM | POA: Diagnosis not present

## 2021-07-26 DIAGNOSIS — M545 Low back pain, unspecified: Secondary | ICD-10-CM | POA: Diagnosis not present

## 2021-07-26 DIAGNOSIS — H539 Unspecified visual disturbance: Secondary | ICD-10-CM | POA: Diagnosis not present

## 2021-07-26 DIAGNOSIS — G8929 Other chronic pain: Secondary | ICD-10-CM | POA: Diagnosis not present

## 2021-07-26 DIAGNOSIS — R262 Difficulty in walking, not elsewhere classified: Secondary | ICD-10-CM | POA: Diagnosis not present

## 2021-07-26 DIAGNOSIS — E538 Deficiency of other specified B group vitamins: Secondary | ICD-10-CM | POA: Diagnosis not present

## 2021-07-26 DIAGNOSIS — M79605 Pain in left leg: Secondary | ICD-10-CM | POA: Diagnosis not present

## 2021-07-27 ENCOUNTER — Telehealth: Payer: Self-pay | Admitting: Family Medicine

## 2021-07-27 NOTE — Telephone Encounter (Signed)
Copied from CRM [id number]. Topic: General - Other >> Jul 27, 2021  2:23 PM Jaquita Rector A wrote: Reason for CRM: Sarah Clinical Pharmacist [facility] called in and request that patient medication list get faxed over for completion of patient review shes currently working on any questions please call Maralyn Sago at Ph# [phone] Fax# [phone]

## 2021-08-07 ENCOUNTER — Ambulatory Visit: Payer: 59

## 2021-08-10 ENCOUNTER — Ambulatory Visit: Payer: 59

## 2021-08-16 ENCOUNTER — Telehealth: Payer: Self-pay | Admitting: Family Medicine

## 2021-08-16 NOTE — Telephone Encounter (Signed)
Pt is calling to reschedule AWV for tomorrow. Please advise

## 2021-08-17 ENCOUNTER — Ambulatory Visit: Payer: 59

## 2021-08-23 ENCOUNTER — Ambulatory Visit (INDEPENDENT_AMBULATORY_CARE_PROVIDER_SITE_OTHER): Payer: 59

## 2021-08-23 ENCOUNTER — Other Ambulatory Visit: Payer: Self-pay

## 2021-08-23 VITALS — BP 121/58 | HR 73 | Temp 97.7°F | Resp 18 | Ht 65.0 in | Wt 139.2 lb

## 2021-08-23 DIAGNOSIS — Z0000 Encounter for general adult medical examination without abnormal findings: Secondary | ICD-10-CM

## 2021-08-23 NOTE — Progress Notes (Addendum)
HPI:  Patient presents to clinic today for their subsequent annual Medicare wellness exam.  Past Medical History:  Diagnosis Date   Headache    Hyperlipidemia    MS (multiple sclerosis) (HCC) 1990   Neurogenic bladder     Current Outpatient Medications  Medication Sig Dispense Refill   albuterol (PROAIR HFA) 108 (90 Base) MCG/ACT inhaler Inhale 2 puffs into the lungs every 6 (six) hours as needed for wheezing or shortness of breath. 8 g 3   alendronate (FOSAMAX) 70 MG tablet TAKE 1 TABLET BY MOUTH EVERY 7 (SEVEN) DAYS. TAKE WITH A FULL GLASS OF WATER ON AN EMPTY STOMACH. 12 tablet 2   Cholecalciferol 25 MCG (1000 UT) tablet Take by mouth.     cyclobenzaprine (FLEXERIL) 10 MG tablet Take 1 tablet (10 mg total) by mouth at bedtime as needed for muscle spasms. 90 tablet 3   dalfampridine 10 MG TB12 Take 10 mg by mouth 2 (two) times daily.     diclofenac (VOLTAREN) 75 MG EC tablet Take 75 mg twice a day for a month then decrease to 75 mg once a day for a month then stop. No over the counter pain relievers except Tylenol take with food     Dimethyl Fumarate 240 MG CPDR Take 240 mg by mouth 2 (two) times daily.     gabapentin (NEURONTIN) 300 MG capsule Take 2 capsules (600mg ) AM and afternoon and 3 caps (900mg ) PM 540 capsule 3   mirabegron ER (MYRBETRIQ) 50 MG TB24 tablet Take 1 tablet (50 mg total) by mouth daily. 90 tablet 3   Misc. Devices (CANE) MISC 1 each by Does not apply route as needed. 1 each 0   nortriptyline (PAMELOR) 10 MG capsule Take 10 mg by mouth 2 (two) times daily.     pantoprazole (PROTONIX) 40 MG tablet Take 1 tablet (40 mg total) by mouth daily before breakfast. 90 tablet 3   potassium chloride (KLOR-CON) 10 MEQ tablet 1 tablet once daily.     pravastatin (PRAVACHOL) 40 MG tablet Take 1 tablet (40 mg total) by mouth daily. 90 tablet 3   QUEtiapine (SEROQUEL) 50 MG tablet Take 1 tablet by mouth daily.     tiotropium (SPIRIVA HANDIHALER) 18 MCG inhalation capsule  INHALE 1 CAPSULE VIA HANDIHALER ONCE DAILY AT THE SAME TIME EVERY DAY 90 capsule 3   traMADol (ULTRAM) 50 MG tablet Take 1 tablet (50 mg total) by mouth at bedtime as needed for moderate pain. 30 tablet 5   albuterol (PROVENTIL) (2.5 MG/3ML) 0.083% nebulizer solution Take 3 mLs (2.5 mg total) by nebulization every 6 (six) hours as needed for wheezing or shortness of breath. (Patient not taking: Reported on 08/23/2021) 150 mL 1   No current facility-administered medications for this visit.    Allergies  Allergen Reactions   Penicillins Rash    Family History  Problem Relation Age of Onset   Diabetes Mother    Heart disease Mother    Diabetes Father    Heart disease Father    Diabetes Sister    Heart disease Sister     Social History   Socioeconomic History   Marital status: Divorced    Spouse name: Not on file   Number of children: 1   Years of education: Not on file   Highest education level: Some college, no degree  Occupational History   Occupation: retired  Tobacco Use   Smoking status: Every Day    Packs/day: 0.25  Years: 20.00    Pack years: 5.00    Types: Cigars, Cigarettes    Last attempt to quit: 01/03/1995    Years since quitting: 26.6   Smokeless tobacco: Never   Tobacco comments:    4 cigarettes daily 10/17/2020  Vaping Use   Vaping Use: Never used  Substance and Sexual Activity   Alcohol use: No    Alcohol/week: 0.0 standard drinks   Drug use: Not Currently    Comment: smokes pods in past   Sexual activity: Not on file  Other Topics Concern   Not on file  Social History Narrative   Not on file   Social Determinants of Health   Financial Resource Strain: Low Risk    Difficulty of Paying Living Expenses: Not hard at all  Food Insecurity: No Food Insecurity   Worried About Programme researcher, broadcasting/film/video in the Last Year: Never true   Ran Out of Food in the Last Year: Never true  Transportation Needs: No Transportation Needs   Lack of Transportation  (Medical): No   Lack of Transportation (Non-Medical): No  Physical Activity: Insufficiently Active   Days of Exercise per Week: 3 days   Minutes of Exercise per Session: 30 min  Stress: No Stress Concern Present   Feeling of Stress : Not at all  Social Connections: Moderately Isolated   Frequency of Communication with Friends and Family: More than three times a week   Frequency of Social Gatherings with Friends and Family: More than three times a week   Attends Religious Services: 1 to 4 times per year   Active Member of [employer] or Organizations: No   Attends Banker Meetings: Never   Marital Status: Divorced  Catering manager Violence: Not on file    Hospitiliaztions:  Health Maintenance: Tdap: 07/23/16 Influenza: 05/03/2021 Shingrix: pt aware she can get it at her pharmacy  Opthalomologist: [facility], will call and schedule an appt Colon Screening: 06/24/13 Mammogram:the pt caregiver will call and schedule mammogram  Bone Density: 01/11/2015    Providers:   PCP: [name], DO  Neurologist: Charolotte Eke, MD  I have personally reviewed and have noted:  1. The patient's medical and social history 2. Their use of alcohol, tobacco or illicit drugs 3. Their current medications and supplements 4. The patient's functional ability including ADL's, fall risks, home safety risks and hearing or visual impairment. 5. Diet and physical activities 6. Evidence for depression or mood disorder  Subjective:   Review of Systems:   Constitutional: Denies fever, malaise, fatigue, headache or abrupt weight changes.  HEENT: Denies eye pain, eye redness, ear pain, ringing in the ears, wax buildup, runny nose, nasal congestion, bloody nose, or sore throat. Respiratory: Denies difficulty breathing, shortness of breath, cough or sputum production.   Cardiovascular: Denies chest pain, chest tightness, palpitations or swelling in the hands or feet.   Gastrointestinal: Denies abdominal pain, bloating, constipation, diarrhea or blood in the stool.  GU: Denies urgency, frequency, pain with urination, burning sensation, blood in urine, odor or discharge. Musculoskeletal: Denies decrease in range of motion, difficulty with gait, muscle pain or joint pain and swelling.  Skin: Denies redness, rashes, lesions or ulcercations.  Neurological: Denies dizziness, difficulty with memory, difficulty with speech or problems with balance and coordination.  Psych: Denies anxiety, depression, SI/HI.  No other specific complaints in a complete review of systems (except as listed in HPI above).  Objective:  PE:   BP (!) 121/58 (BP Location: Left  Arm, Patient Position: Sitting, Cuff Size: Normal)    Pulse 73    Temp 97.7 F (36.5 C) (Temporal)    Resp 18    Ht 5\' 5"  (1.651 m)    Wt 139 lb 3.2 oz (63.1 kg)    SpO2 100%    BMI 23.16 kg/m  Wt Readings from Last 3 Encounters:  08/23/21 139 lb 3.2 oz (63.1 kg)  05/03/21 140 lb (63.5 kg)  10/17/20 140 lb (63.5 kg)     BMET    Component Value Date/Time   NA 145 10/17/2020 1006   NA 144 01/06/2015 0917   NA 141 11/17/2013 2218   K 3.8 10/17/2020 1006   K 4.3 11/17/2013 2218   CL 108 10/17/2020 1006   CL 110 (H) 11/17/2013 2218   CO2 28 10/17/2020 1006   CO2 26 11/17/2013 2218   GLUCOSE 81 10/17/2020 1006   GLUCOSE 88 11/17/2013 2218   BUN 18 10/17/2020 1006   BUN 11 01/06/2015 0917   BUN 11 11/17/2013 2218   CREATININE 0.58 10/17/2020 1006   CALCIUM 9.2 10/17/2020 1006   CALCIUM 9.1 11/17/2013 2218   GFRNONAA 98 10/17/2020 1006   GFRAA 114 10/17/2020 1006    Lipid Panel     Component Value Date/Time   CHOL 169 10/17/2020 1006   CHOL 170 01/06/2015 0917   TRIG 41 10/17/2020 1006   HDL 67 10/17/2020 1006   HDL 52 01/06/2015 0917   CHOLHDL 2.5 10/17/2020 1006   VLDL 9 02/12/2017 0805   LDLCALC 90 10/17/2020 1006    CBC    Component Value Date/Time   WBC 6.8 10/17/2020 1006   RBC  4.01 10/17/2020 1006   HGB 13.7 10/17/2020 1006   HGB 14.2 11/17/2013 2218   HCT 41.3 10/17/2020 1006   HCT 43.1 11/17/2013 2218   PLT 201 10/17/2020 1006   PLT 202 11/17/2013 2218   MCV 103.0 (H) 10/17/2020 1006   MCV 102 (H) 11/17/2013 2218   MCH 34.2 (H) 10/17/2020 1006   MCHC 33.2 10/17/2020 1006   RDW 12.7 10/17/2020 1006   RDW 13.7 11/17/2013 2218   LYMPHSABS 1,027 10/17/2020 1006   LYMPHSABS 1.2 11/17/2013 2218   MONOABS 414 02/12/2017 0805   MONOABS 0.5 11/17/2013 2218   EOSABS 27 10/17/2020 1006   EOSABS 0.0 11/17/2013 2218   BASOSABS 7 10/17/2020 1006   BASOSABS 0.1 11/17/2013 2218    Hgb A1C Lab Results  Component Value Date   HGBA1C 5.1 10/17/2020      Assessment and Plan:   Medicare Annual Wellness Visit:  Diet: Heart healthy  Physical activity: mild exercise  Depression/mood screen: Negative,  Flowsheet Row Office Visit from 10/17/2020 in [facility]  PHQ-9 Total Score 1      Hearing: Intact to whispered voice Visual acuity: Grossly normal, performs annual eye exam  ADLs: Find  Fall risk: Recent fall. Lost of balance. Declined injury. Home safety: Good ASSISTIVE DEVICES UTILIZED TO PREVENT FALLS:   Life alert? Yes  Use of a cane, walker or w/c? Yes  walker  Grab bars in the bathroom? Yes  Shower chair or bench in shower? Yes  Elevated toilet seat or a handicapped toilet? Yes   Providers:  PCP: [name], DO Neurologist: Lupita Shutter Dentist: No annual dental appt  Opthalmology: [facility] x 2-3 yrs    Cognitive evaluation:  6CIT Screen 08/01/2020  What Year? 0 points  What month? 0 points  What time?  0 points  Count back from 20 4 points  Months in reverse 4 points  Repeat phrase 10 points  Total Score 18     EOL planning: Adv directives packet given at visit today.  full code    Next appointment: No upcoming appt scheduled    Lonna Cobb, CMA

## 2021-09-06 ENCOUNTER — Other Ambulatory Visit: Payer: Self-pay | Admitting: Family Medicine

## 2021-09-06 NOTE — Telephone Encounter (Signed)
Requested Prescriptions  ?Pending Prescriptions Disp Refills  ?? albuterol (VENTOLIN HFA) 108 (90 Base) MCG/ACT inhaler [Pharmacy Med Name: albuterol sulfate HFA 90 mcg/actuation aerosol inhaler] 8.5 g 3  ?  Sig: INHALE TWO PUFFS BY MOUTH EVERY 6 HOURS AS NEEDED FOR WHEEZING OR SHORTNESS OF BREATH (BULK)  ?  ? Pulmonology:  Beta Agonists 2 Passed - 09/06/2021 11:26 AM  ?  ?  Passed - Last BP in normal range  ?  BP Readings from Last 1 Encounters:  ?08/23/21 (!) 121/58  ?   ?  ?  Passed - Last Heart Rate in normal range  ?  Pulse Readings from Last 1 Encounters:  ?08/23/21 73  ?   ?  ?  Passed - Valid encounter within last 12 months  ?  Recent Outpatient Visits   ?      ? 4 months ago Multiple sclerosis, relapsing-remitting (HCC)  ? [facility] [place], Netta Neat, DO  ? 10 months ago Multiple sclerosis, relapsing-remitting [facility])  ? [facility], Netta Neat, DO  ? 1 year ago Multiple sclerosis, relapsing-remitting [facility])  ? [facility] [place], Netta Neat, DO  ? 2 years ago Multiple sclerosis, relapsing-remitting [facility])  ? [facility] [place], Netta Neat, DO  ? 3 years ago Multiple sclerosis, relapsing-remitting [facility])  ? [facility] Smitty Cords, DO  ?  ?  ? ?  ?  ?  ? ? ?

## 2021-10-18 DIAGNOSIS — M545 Low back pain, unspecified: Secondary | ICD-10-CM | POA: Diagnosis not present

## 2021-10-18 DIAGNOSIS — G8929 Other chronic pain: Secondary | ICD-10-CM | POA: Diagnosis not present

## 2021-10-18 DIAGNOSIS — G35 Multiple sclerosis: Secondary | ICD-10-CM | POA: Diagnosis not present

## 2021-10-18 DIAGNOSIS — H539 Unspecified visual disturbance: Secondary | ICD-10-CM | POA: Diagnosis not present

## 2021-10-18 DIAGNOSIS — M79605 Pain in left leg: Secondary | ICD-10-CM | POA: Diagnosis not present

## 2021-10-18 DIAGNOSIS — R262 Difficulty in walking, not elsewhere classified: Secondary | ICD-10-CM | POA: Diagnosis not present

## 2021-10-25 ENCOUNTER — Ambulatory Visit (INDEPENDENT_AMBULATORY_CARE_PROVIDER_SITE_OTHER): Payer: Medicare Other | Admitting: Podiatry

## 2021-10-25 DIAGNOSIS — M79674 Pain in right toe(s): Secondary | ICD-10-CM

## 2021-10-25 DIAGNOSIS — B351 Tinea unguium: Secondary | ICD-10-CM

## 2021-10-25 DIAGNOSIS — M79675 Pain in left toe(s): Secondary | ICD-10-CM

## 2021-10-25 NOTE — Progress Notes (Signed)
?  Subjective:  Patient ID: [name], female    DOB: 01/11/1956,  MRN: [id number]  Chief Complaint  Patient presents with   Nail Problem    Nail trim   66 y.o. female returns for the above complaint.  Patient presents with thickened elongated dystrophic toenails x10.  Painful to touch.  She would like to have it debrided and she is not able to do it herself.  She is not a diabetic.  She denies any other acute complaints.  Objective:  There were no vitals filed for this visit. Podiatric Exam: Vascular: dorsalis pedis and posterior tibial pulses are palpable bilateral. Capillary return is immediate. Temperature gradient is WNL. Skin turgor WNL  Sensorium: Normal Semmes Weinstein monofilament test. Normal tactile sensation bilaterally. Nail Exam: Pt has thick disfigured discolored nails with subungual debris noted bilateral entire nail hallux through fifth toenails.  Pain on palpation to the nails. Ulcer Exam: There is no evidence of ulcer or pre-ulcerative changes or infection. Orthopedic Exam: Muscle tone and strength are WNL. No limitations in general ROM. No crepitus or effusions noted. HAV  B/L.  Hammer toes 2-5  B/L. Skin: No Porokeratosis. No infection or ulcers    Assessment & Plan:   1. Pain due to onychomycosis of toenails of both feet     Patient was evaluated and treated and all questions answered.  Onychomycosis with pain  -Nails palliatively debrided as below. -Educated on self-care  Procedure: Nail Debridement Rationale: pain  Type of Debridement: manual, sharp debridement. Instrumentation: Nail nipper, rotary burr. Number of Nails: 10  Procedures and Treatment: Consent by patient was obtained for treatment procedures. The patient understood the discussion of treatment and procedures well. All questions were answered thoroughly reviewed. Debridement of mycotic and hypertrophic toenails, 1 through 5 bilateral and clearing of subungual debris. No ulceration, no  infection noted.  Return Visit-Office Procedure: Patient instructed to return to the office for a follow up visit 3 months for continued evaluation and treatment.  Roderic Lammert, DPM    No follow-ups on file.  

## 2021-11-14 ENCOUNTER — Ambulatory Visit: Payer: 59 | Attending: Family Medicine | Admitting: Physical Therapy

## 2021-11-15 ENCOUNTER — Ambulatory Visit: Payer: 59 | Admitting: Family Medicine

## 2021-11-20 ENCOUNTER — Ambulatory Visit (INDEPENDENT_AMBULATORY_CARE_PROVIDER_SITE_OTHER): Payer: Medicare Other | Admitting: Family Medicine

## 2021-11-20 ENCOUNTER — Encounter: Payer: Self-pay | Admitting: Family Medicine

## 2021-11-20 VITALS — BP 124/62 | HR 76 | Ht 65.0 in | Wt 140.4 lb

## 2021-11-20 DIAGNOSIS — L98492 Non-pressure chronic ulcer of skin of other sites with fat layer exposed: Secondary | ICD-10-CM | POA: Diagnosis not present

## 2021-11-20 DIAGNOSIS — Z1231 Encounter for screening mammogram for malignant neoplasm of breast: Secondary | ICD-10-CM | POA: Diagnosis not present

## 2021-11-20 MED ORDER — DOXYCYCLINE HYCLATE 100 MG PO TABS: 100.0000 mg | ORAL_TABLET | Freq: Two times a day (BID) | ORAL | 0 refills | Status: DC

## 2021-11-20 NOTE — Progress Notes (Signed)
Subjective:    Patient ID: [name], female    DOB: 1956/06/04, 66 y.o.   MRN: [id number]  [name] is a 66 y.o. female presenting on 11/20/2021 for Abscess   HPI  Non healing ulceration of skin, chest wall Has spot on lower L side chest wall anteriorly with skin sore with opening that has not healed with some drainage. Not involving breast tissue. Admits foul odor drainage. No extending redness Present >1 year  Denies fever or chills  Health Maintenance: Due for mammogram, order was in previously but not scheduled yet. Asymptomatic.     11/20/2021    8:26 AM 08/23/2021   10:15 AM 10/17/2020    9:45 AM  Depression screen PHQ 2/9  Decreased Interest 0 0 0  Down, Depressed, Hopeless 0 0 0  PHQ - 2 Score 0 0 0  Altered sleeping 0  0  Tired, decreased energy 0  0  Change in appetite 0  0  Feeling bad or failure about yourself  0  0  Trouble concentrating 1  1  Moving slowly or fidgety/restless 0  0  Suicidal thoughts 0  0  PHQ-9 Score 1  1  Difficult doing work/chores   Not difficult at all    Social History   Tobacco Use   Smoking status: Every Day    Packs/day: 0.25    Years: 20.00    Pack years: 5.00    Types: Cigars, Cigarettes    Last attempt to quit: 01/03/1995    Years since quitting: 26.8   Smokeless tobacco: Never   Tobacco comments:    4 cigarettes daily 10/17/2020  Vaping Use   Vaping Use: Never used  Substance Use Topics   Alcohol use: No    Alcohol/week: 0.0 standard drinks   Drug use: Not Currently    Comment: smokes pods in past    Review of Systems Per HPI unless specifically indicated above     Objective:    BP 124/62   Pulse 76   Ht 5\' 5"  (1.651 m)   Wt 140 lb 6.4 oz (63.7 kg)   SpO2 100%   BMI 23.36 kg/m   Wt Readings from Last 3 Encounters:  11/20/21 140 lb 6.4 oz (63.7 kg)  08/23/21 139 lb 3.2 oz (63.1 kg)  05/03/21 140 lb (63.5 kg)    Physical Exam Vitals and nursing note reviewed.  Constitutional:      General:  She is not in acute distress.    Appearance: Normal appearance. She is well-developed. She is not diaphoretic.     Comments: Well-appearing, comfortable, cooperative  HENT:     Head: Normocephalic and atraumatic.  Eyes:     General:        Right eye: No discharge.        Left eye: No discharge.     Conjunctiva/sclera: Conjunctivae normal.  Cardiovascular:     Rate and Rhythm: Normal rate.  Pulmonary:     Effort: Pulmonary effort is normal.  Skin:    General: Skin is warm and dry.     Findings: Lesion (Left lower chest midline xiphoid region or epigastric with 2 cm indurated open ulceration) present. No erythema or rash.  Neurological:     Mental Status: She is alert and oriented to person, place, and time.  Psychiatric:        Mood and Affect: Mood normal.        Behavior: Behavior normal.  Thought Content: Thought content normal.     Comments: Well groomed, good eye contact, normal speech and thoughts   Results for orders placed or performed in visit on 10/17/20  CBC with Differential/Platelet  Result Value Ref Range   WBC 6.8 3.8 - 10.8 Thousand/uL   RBC 4.01 3.80 - 5.10 Million/uL   Hemoglobin 13.7 11.7 - 15.5 g/dL   HCT 24.5 80.9 - 98.3 %   MCV 103.0 (H) 80.0 - 100.0 fL   MCH 34.2 (H) 27.0 - 33.0 pg   MCHC 33.2 32.0 - 36.0 g/dL   RDW 38.2 50.5 - 39.7 %   Platelets 201 140 - 400 Thousand/uL   MPV 10.4 7.5 - 12.5 fL   Neutro Abs 5,263 1,500 - 7,800 cells/uL   Lymphs Abs 1,027 850 - 3,900 cells/uL   Absolute Monocytes 476 200 - 950 cells/uL   Eosinophils Absolute 27 15 - 500 cells/uL   Basophils Absolute 7 0 - 200 cells/uL   Neutrophils Relative % 77.4 %   Total Lymphocyte 15.1 %   Monocytes Relative 7.0 %   Eosinophils Relative 0.4 %   Basophils Relative 0.1 %  COMPLETE METABOLIC PANEL WITH GFR  Result Value Ref Range   Glucose, Bld 81 65 - 99 mg/dL   BUN 18 7 - 25 mg/dL   Creat 6.73 4.19 - 3.79 mg/dL   GFR, Est Non African American 98 > OR = 60  mL/min/1.58m2   GFR, Est African American 114 > OR = 60 mL/min/1.75m2   BUN/Creatinine Ratio NOT APPLICABLE 6 - 22 (calc)   Sodium 145 135 - 146 mmol/L   Potassium 3.8 3.5 - 5.3 mmol/L   Chloride 108 98 - 110 mmol/L   CO2 28 20 - 32 mmol/L   Calcium 9.2 8.6 - 10.4 mg/dL   Total Protein 7.1 6.1 - 8.1 g/dL   Albumin 4.4 3.6 - 5.1 g/dL   Globulin 2.7 1.9 - 3.7 g/dL (calc)   AG Ratio 1.6 1.0 - 2.5 (calc)   Total Bilirubin 0.4 0.2 - 1.2 mg/dL   Alkaline phosphatase (APISO) 64 37 - 153 U/L   AST 11 10 - 35 U/L   ALT 8 6 - 29 U/L  Lipid panel  Result Value Ref Range   Cholesterol 169 <200 mg/dL   HDL 67 > OR = 50 mg/dL   Triglycerides 41 <024 mg/dL   LDL Cholesterol (Calc) 90 mg/dL (calc)   Total CHOL/HDL Ratio 2.5 <5.0 (calc)   Non-HDL Cholesterol (Calc) 102 <130 mg/dL (calc)  Hemoglobin O9B  Result Value Ref Range   Hgb A1c MFr Bld 5.1 <5.7 % of total Hgb   Mean Plasma Glucose 100 mg/dL   eAG (mmol/L) 5.5 mmol/L  VITAMIN D 25 Hydroxy (Vit-D Deficiency, Fractures)  Result Value Ref Range   Vit D, 25-Hydroxy 21 (L) 30 - 100 ng/mL      Assessment & Plan:   Problem List Items Addressed This Visit   None Visit Diagnoses     Chronic skin ulcer with fat layer exposed (HCC)    -  Primary   Relevant Medications   doxycycline (VIBRA-TABS) 100 MG tablet   Other Relevant Orders   AMB referral to wound care center   Encounter for screening mammogram for malignant neoplasm of breast       Relevant Orders   MM 3D SCREEN BREAST BILATERAL       Call the Imaging Center below anytime to schedule your own appointment now that order  has been placed.  [facility] [address] [place], [place] [address] Phone: [phone]  ----------------------  Referral For wound on chest.  [facility] [address] [place],  [place]  [address] Main: [phone]  Start taking Doxycycline antibiotic 100mg   twice daily for 10 days. Take with full glass of water and stay upright for at least 30 min after taking, may be seated or standing, but should NOT lay down. This is just a safety precaution, if this medicine does not go all the way down throat well it could cause some burning discomfort to throat and esophagus.  Orders Placed This Encounter  Procedures   MM 3D SCREEN BREAST BILATERAL    Standing Status:   Future    Standing Expiration Date:   11/21/2022    Order Specific Question:   Reason for Exam (SYMPTOM  OR DIAGNOSIS REQUIRED)    Answer:   Screening bilateral 3D Mammogram [place]    Order Specific Question:   Preferred imaging location?    Answer:    [facility]   AMB referral to wound care center    Referral Priority:   Routine    Referral Type:   Consultation    Number of Visits Requested:   1     Meds ordered this encounter  Medications   doxycycline (VIBRA-TABS) 100 MG tablet    Sig: Take 1 tablet (100 mg total) by mouth 2 (two) times daily. For 10 days. Take with full glass of water, stay upright 30 min after taking.    Dispense:  20 tablet    Refill:  0     Follow up plan: Return if symptoms worsen or fail to improve.   11/23/2022, DO [facility] 11/20/2021, 8:39 AM

## 2021-11-20 NOTE — Patient Instructions (Addendum)
Thank you for coming to the office today.  For Mammogram screening for breast cancer   Call the [facility] below anytime to schedule your own appointment now that order has been placed.  [facility] [place], [place] [address] Phone: [phone]  ----------------------  Referral For wound on chest.  [facility] [address] [place],  [place]  [address] Main: [phone]  Start taking Doxycycline antibiotic 100mg  twice daily for 10 days. Take with full glass of water and stay upright for at least 30 min after taking, may be seated or standing, but should NOT lay down. This is just a safety precaution, if this medicine does not go all the way down throat well it could cause some burning discomfort to throat and esophagus.   Please schedule a Follow-up Appointment to: Return if symptoms worsen or fail to improve.  If you have any other questions or concerns, please feel free to call the office or send a message through [place]. You may also schedule an earlier appointment if necessary.  Additionally, you may be receiving a survey about your experience at our office within a few days to 1 week by e-mail or mail. We value your feedback.  Nobie Putnam, DO [place]

## 2021-11-21 ENCOUNTER — Ambulatory Visit: Payer: 59 | Admitting: Physical Therapy

## 2021-11-21 ENCOUNTER — Ambulatory Visit: Payer: Self-pay | Admitting: *Deleted

## 2021-11-21 NOTE — Telephone Encounter (Signed)
Summary: advice - wound care   [name] called in needing advice regarding the pt,  caller stated the pt has a wound and wanted to know if she could put gauze on it.      Reason for Disposition . 1 or 2 small sores  Answer Assessment - Initial Assessment Questions 1. APPEARANCE of SORES: "What do the sores look like?"     Open wound- knot was present- now open and draining 2. NUMBER: "How many sores are there?"     1 area 3. SIZE: "How big is the largest sore?"       4. LOCATION: "Where are the sores located?"     Under left breast- leaking with odor 5. ONSET: "When did the sores begin?"     Over 1 year- chronic 6. CAUSE: "What do you think is causing the sores?"     Infection- ulcer 7. OTHER SYMPTOMS: "Do you have any other symptoms?" (e.g., fever, new weakness)     Odor, leaking, pain  Protocols used: Sores-A-AH

## 2021-11-21 NOTE — Telephone Encounter (Signed)
  Chief Complaint: wound cleaning instructions- patient friend is calling for instructions on wound care- wound has opened and is draining Symptoms: advised clean wound 2-3 times/day with soap and water- may rinse with saline, apply antibiotic ointment and cover with no stick dressing.  Frequency: patient is awaiting wound clinic appointment Pertinent Negatives: Patient denies fever, weakness Disposition: [] ED /[] Urgent Care (no appt availability in office) / [] Appointment(In office/virtual)/ []  [facility]/ [x] Home Care/ [] Refused Recommended Disposition /[] [facility]/ []  Follow-up with PCP Additional Notes: Patient did vomit with first dose of antibiotic- - she has been advised to take with food and notify provider if unable to tolerate

## 2021-11-28 ENCOUNTER — Ambulatory Visit: Payer: 59 | Admitting: Physical Therapy

## 2021-11-30 DIAGNOSIS — M21372 Foot drop, left foot: Secondary | ICD-10-CM | POA: Diagnosis not present

## 2021-12-05 ENCOUNTER — Ambulatory Visit: Payer: 59 | Admitting: Physical Therapy

## 2021-12-05 ENCOUNTER — Ambulatory Visit: Payer: 59 | Admitting: Internal Medicine

## 2021-12-06 ENCOUNTER — Other Ambulatory Visit: Payer: Self-pay | Admitting: Family Medicine

## 2021-12-06 DIAGNOSIS — M8589 Other specified disorders of bone density and structure, multiple sites: Secondary | ICD-10-CM

## 2021-12-06 DIAGNOSIS — J432 Centrilobular emphysema: Secondary | ICD-10-CM

## 2021-12-06 NOTE — Telephone Encounter (Signed)
Requested medication (s) are due for refill today: yes  Requested medication (s) are on the active medication list: yes    Last refill: 03/09/21 #12  2 refills  Future visit scheduled no  Notes to clinic:Failed due to  labs, please review.  Requested Prescriptions  Pending Prescriptions Disp Refills   alendronate (FOSAMAX) 70 MG tablet [Pharmacy Med Name: alendronate 70 mg tablet] 12 tablet 5    Sig: TAKE 1 TABLET BY MOUTH WEEKLY ON MONDAY AT 7AM TAKE WITH 6-8 OZ WATER & REMAIN UPRIGHT FOR 30 MINUTES AFTER ADMINISTRATION     Endocrinology:  Bisphosphonates Failed - 12/06/2021  2:39 PM      Failed - Ca in normal range and within 360 days    Calcium  Date Value Ref Range Status  10/17/2020 9.2 8.6 - 10.4 mg/dL Final   Calcium, Total  Date Value Ref Range Status  11/17/2013 9.1 8.5 - 10.1 mg/dL Final         Failed - Vitamin D in normal range and within 360 days    Vit D, 25-Hydroxy  Date Value Ref Range Status  10/17/2020 21 (L) 30 - 100 ng/mL Final    Comment:    Vitamin D Status         25-OH Vitamin D: . Deficiency:                    <20 ng/mL Insufficiency:             20 - 29 ng/mL Optimal:                 > or = 30 ng/mL . For 25-OH Vitamin D testing on patients on  D2-supplementation and patients for whom quantitation  of D2 and D3 fractions is required, the [facility](TM) 25-OH VIT D, (D2,D3), LC/MS/MS is recommended: order  code [id number] (patients >49yr). See Note 1 . Note 1 . For additional information, please refer to  http://education.[facility].com/faq/FAQ199  (This link is being provided for informational/ educational purposes only.)          Failed - Cr in normal range and within 360 days    Creat  Date Value Ref Range Status  10/17/2020 0.58 0.50 - 0.99 mg/dL Final    Comment:    For patients >448years of age, the reference limit for Creatinine is approximately 13% higher for people identified as African-American. .          Failed -  Mg Level in normal range and within 360 days    No results found for: "MG"       Failed - Phosphate in normal range and within 360 days    No results found for: "PHOS"       Failed - eGFR is 30 or above and within 360 days    GFR, Est African American  Date Value Ref Range Status  10/17/2020 114 > OR = 60 mL/min/1.722mFinal   GFR, Est Non African American  Date Value Ref Range Status  10/17/2020 98 > OR = 60 mL/min/1.7339minal         Failed - Bone Mineral Density or Dexa Scan completed in the last 2 years      Passed - Valid encounter within last 12 months    Recent Outpatient Visits           2 weeks ago [facility]  7 months ago Multiple sclerosis, relapsing-remitting ([place])   [place], DO   1 year ago Multiple sclerosis, relapsing-remitting [facility])   [facility] [name], DO   1 year ago Multiple sclerosis, relapsing-remitting [facility])   [facility] [name], DO   2 years ago Multiple sclerosis, relapsing-remitting [facility])   [place], DO               albuterol (VENTOLIN HFA) 108 (90 Base) MCG/ACT inhaler [Pharmacy Med Name: albuterol sulfate HFA 90 mcg/actuation aerosol inhaler] 8.5 g 5    Sig: INHALE TWO PUFFS BY MOUTH EVERY 6 HOURS AS NEEDED FOR WHEEZING AND SHORTNESS OF BREATH (BULK)     Pulmonology:  Beta Agonists 2 Passed - 12/06/2021  2:39 PM      Passed - Last BP in normal range    BP Readings from Last 1 Encounters:  11/20/21 124/62         Passed - Last Heart Rate in normal range    Pulse Readings from Last 1 Encounters:  11/20/21 76         Passed - Valid encounter within last 12 months    Recent Outpatient Visits           2 weeks ago Chronic skin ulcer with fat layer exposed [facility])   [place], DO   7 months ago Multiple sclerosis, relapsing-remitting [facility])   [place], DO   1 year ago Multiple sclerosis, relapsing-remitting [facility])   [place], DO   1 year ago Multiple sclerosis, relapsing-remitting [facility])   [place], DO   2 years ago Multiple sclerosis, relapsing-remitting [facility])   [facility] [place], Devonne Doughty, DO

## 2021-12-10 ENCOUNTER — Ambulatory Visit: Payer: 59 | Admitting: Physical Therapy

## 2021-12-12 ENCOUNTER — Ambulatory Visit: Payer: 59 | Admitting: Physical Therapy

## 2021-12-12 ENCOUNTER — Encounter: Payer: Medicare Other | Attending: Internal Medicine | Admitting: Internal Medicine

## 2021-12-12 DIAGNOSIS — L728 Other follicular cysts of the skin and subcutaneous tissue: Secondary | ICD-10-CM | POA: Diagnosis not present

## 2021-12-12 DIAGNOSIS — S21109A Unspecified open wound of unspecified front wall of thorax without penetration into thoracic cavity, initial encounter: Secondary | ICD-10-CM | POA: Diagnosis not present

## 2021-12-12 DIAGNOSIS — X58XXXA Exposure to other specified factors, initial encounter: Secondary | ICD-10-CM | POA: Insufficient documentation

## 2021-12-12 DIAGNOSIS — G35 Multiple sclerosis: Secondary | ICD-10-CM | POA: Insufficient documentation

## 2021-12-12 DIAGNOSIS — M5451 Vertebrogenic low back pain: Secondary | ICD-10-CM | POA: Diagnosis not present

## 2021-12-13 ENCOUNTER — Other Ambulatory Visit: Payer: Self-pay | Admitting: Internal Medicine

## 2021-12-13 DIAGNOSIS — S21109A Unspecified open wound of unspecified front wall of thorax without penetration into thoracic cavity, initial encounter: Secondary | ICD-10-CM

## 2021-12-13 DIAGNOSIS — L728 Other follicular cysts of the skin and subcutaneous tissue: Secondary | ICD-10-CM

## 2021-12-13 NOTE — Progress Notes (Signed)
LYNELL, GREENHOUSE ([id number]) Visit Report for 12/12/2021 Allergy List Details Patient Name: [name], [name] Date of Service: 12/12/2021 10:00 AM Medical Record Number: [id number] Patient Account Number: [id number] Date of Birth/Sex: [birth year] (66 y.o. F) Treating RN: Huel Coventry Primary Care Zyion Leidner: [name] Other Clinician: Referring Osa Fogarty: [name] Treating Flora Ratz/Extender: Tilda Franco in Treatment: 0 Allergies Active Allergies penicillin Allergy Notes Electronic Signature(s) Signed: 12/13/2021 10:16:02 AM By: Elliot Gurney, BSN, RN, CWS, Kim RN, BSN Entered By: Elliot Gurney, BSN, RN, CWS, Kim on 12/12/2021 10:00:14 Kawai, Binnie Rail ([id number]) -------------------------------------------------------------------------------- Arrival Information Details Patient Name: [name], [name] Date of Service: 12/12/2021 10:00 AM Medical Record Number: [id number] Patient Account Number: [id number] Date of Birth/Sex: [birth year] (66 y.o. F) Treating RN: Huel Coventry Primary Care Treyvone Chelf: [name] Other Clinician: Referring Brylin Stopper: [name] Treating Margert Edsall/Extender: Tilda Franco in Treatment: 0 Visit Information Patient Arrived: Cloyde Reams Time: 09:48 Accompanied By: self Transfer Assistance: None Patient Identification Verified: Yes Secondary Verification Process Completed: Yes Patient Has Alerts: Yes Patient Alerts: Patient on Blood Thinner Not Diabetic Electronic Signature(s) Signed: 12/13/2021 10:16:02 AM By: Elliot Gurney, BSN, RN, CWS, Kim RN, BSN Entered By: Elliot Gurney, BSN, RN, CWS, Kim on 12/12/2021 09:54:29 Brandel, Binnie Rail ([id number]) -------------------------------------------------------------------------------- Clinic Level of Care Assessment Details Patient Name: [name], [name] Date of Service: 12/12/2021 10:00 AM Medical Record Number: [id number] Patient Account Number: [id number] Date of Birth/Sex: 08-23-1955  (66 y.o. F) Treating RN: Huel Coventry Primary Care Quenesha Douglass: [name] Other Clinician: Referring Tai Skelly: [name] Treating Lauriann Milillo/Extender: Tilda Franco in Treatment: 0 Clinic Level of Care Assessment Items TOOL 1 Quantity Score []  - Use when EandM and Procedure is performed on INITIAL visit 0 ASSESSMENTS - Nursing Assessment / Reassessment X - General Physical Exam (combine w/ comprehensive assessment (listed just below) when performed on new 1 20 pt. evals) X- 1 25 Comprehensive Assessment (HX, ROS, Risk Assessments, Wounds Hx, etc.) ASSESSMENTS - Wound and Skin Assessment / Reassessment []  - Dermatologic / Skin Assessment (not related to wound area) 0 ASSESSMENTS - Ostomy and/or Continence Assessment and Care []  - Incontinence Assessment and Management 0 []  - 0 Ostomy Care Assessment and Management (repouching, etc.) PROCESS - Coordination of Care X - Simple Patient / Family Education for ongoing care 1 15 []  - 0 Complex (extensive) Patient / Family Education for ongoing care X- 1 10 Staff obtains , Records, Test Results / Process Orders []  - 0 Staff telephones HHA, Nursing Homes / Clarify orders / etc []  - 0 Routine Transfer to another Facility (non-emergent condition) []  - 0 Routine Hospital Admission (non-emergent condition) X- 1 15 New Admissions / / Ordering NPWT, Apligraf, etc. []  - 0 Emergency Hospital Admission (emergent condition) PROCESS - Special Needs []  - Pediatric / Minor Patient Management 0 []  - 0 Isolation Patient Management []  - 0 Hearing / Language / Visual special needs []  - 0 Assessment of Community assistance (transportation, D/C planning, etc.) []  - 0 Additional assistance / Altered mentation []  - 0 Support Surface(s) Assessment (bed, cushion, seat, etc.) INTERVENTIONS - Miscellaneous []  - External ear exam 0 []  - 0 Patient Transfer (multiple staff / /  Similar devices) []  - 0 Simple Staple / Suture removal (25 or less) []  - 0 Complex Staple / Suture removal (26 or more) []  - 0 Hypo/Hyperglycemic Management (do not check if billed separately) []  - 0 Ankle / Brachial Index (ABI) - do not check if billed separately Has the patient been seen at  the hospital within the last three years: Yes Total Score: 85 Level Of Care: New/Established - Level 3 Messineo, Curtina B. ([id number]) Electronic Signature(s) Signed: 12/13/2021 10:16:02 AM By: Elliot Gurney, BSN, RN, CWS, Kim RN, BSN Entered By: Elliot Gurney, BSN, RN, CWS, Kim on 12/12/2021 10:43:31 Nghiem, Binnie Rail ([id number]) -------------------------------------------------------------------------------- Encounter Discharge Information Details Patient Name: [name], [name] Date of Service: 12/12/2021 10:00 AM Medical Record Number: [id number] Patient Account Number: [id number] Date of Birth/Sex: Nov 14, 1955 (66 y.o. F) Treating RN: Huel Coventry Primary Care Belmont Valli: [name] Other Clinician: Referring Nykia Turko: [name] Treating Ellesse Antenucci/Extender: Tilda Franco in Treatment: 0 Encounter Discharge Information Items Post Procedure Vitals Discharge Condition: Stable Temperature (F): 98.3 Ambulatory Status: Walker Pulse (bpm): 83 Discharge Destination: Home Respiratory Rate (breaths/min): 16 Transportation: Private Auto Blood Pressure (mmHg): 132/81 Accompanied By: self Schedule Follow-up Appointment: Yes Clinical Summary of Care: Electronic Signature(s) Signed: 12/13/2021 10:16:02 AM By: Elliot Gurney, BSN, RN, CWS, Kim RN, BSN Entered By: Elliot Gurney, BSN, RN, CWS, Kim on 12/12/2021 10:45:21 Mccown, Binnie Rail ([id number]) -------------------------------------------------------------------------------- Lower Extremity Assessment Details Patient Name: [name], [name] Date of Service: 12/12/2021 10:00 AM Medical Record Number: [id number] Patient Account Number: [id number] Date of  Birth/Sex: Sep 27, 1955 (66 y.o. F) Treating RN: Huel Coventry Primary Care Dinesh Ulysse: [name] Other Clinician: Referring Paisleigh Maroney: [name] Treating Raiquan Chandler/Extender: Tilda Franco in Treatment: 0 Electronic Signature(s) Signed: 12/13/2021 10:16:02 AM By: Elliot Gurney, BSN, RN, CWS, Kim RN, BSN Entered By: Elliot Gurney, BSN, RN, CWS, Kim on 12/12/2021 09:58:04 Finazzo, Binnie Rail ([id number]) -------------------------------------------------------------------------------- Multi Wound Chart Details Patient Name: Drummond, Eris B. Date of Service: 12/12/2021 10:00 AM Medical Record Number: [id number] Patient Account Number: [id number] Date of Birth/Sex: 1955-08-23 (66 y.o. F) Treating RN: Huel Coventry Primary Care Krimson Massmann: [name] Other Clinician: Referring Alton Tremblay: [name] Treating Nona Gracey/Extender: Tilda Franco in Treatment: 0 Vital Signs Height(in): 65 Pulse(bpm): 83 Weight(lbs): 141 Blood Pressure(mmHg): 132/81 Body Mass Index(BMI): 23.5 Temperature(F): 98.3 Respiratory Rate(breaths/min): 16 Photos: [N/A:N/A] Wound Location: Midline Chest N/A N/A Wounding Event: Gradually Appeared N/A N/A Primary Etiology: Abscess N/A N/A Comorbid History: Hypertension N/A N/A Date Acquired: 10/12/2021 N/A N/A Weeks of Treatment: 0 N/A N/A Wound Status: Open N/A N/A Wound Recurrence: No N/A N/A Measurements L x W x D (cm) 0.5x0.3x0.3 N/A N/A Area (cm) : 0.118 N/A N/A Volume (cm) : 0.035 N/A N/A % Reduction in Area: 0.00% N/A N/A % Reduction in Volume: 0.00% N/A N/A Starting Position 1 (o'clock): 9 Ending Position 1 (o'clock): 1 Maximum Distance 1 (cm): 0.5 Undermining: Yes N/A N/A Classification: Full Thickness Without Exposed N/A N/A Support Structures Exudate Amount: Medium N/A N/A Exudate Type: Serous N/A N/A Exudate Color: amber N/A N/A Wound Margin: Flat and Intact N/A N/A Granulation Amount: Medium (34-66%) N/A  N/A Granulation Quality: Pale N/A N/A Necrotic Amount: Medium (34-66%) N/A N/A Exposed Structures: Fat Layer (Subcutaneous Tissue): N/A N/A Yes Fascia: No Tendon: No Muscle: No Joint: No Bone: No Procedures Performed: Incision and Drainage N/A N/A Treatment Notes Wound #1 (Chest) Wound Laterality: Midline Cleanser Soap and Water Febus, Tiphany B. ([id number]) Discharge Instruction: Gently cleanse wound with antibacterial soap, rinse and pat dry prior to dressing wounds Peri-Wound Care Topical Primary Dressing Iodoform 1/4x 5 (in/yd) Discharge Instruction: Apply Iodoform Packing Strip as instructed. Secondary Dressing Gauze Discharge Instruction: As directed: dry, moistened with saline or moistened with Dakins Solution Secured With Medipore Tape - 46M Medipore H Soft Cloth Surgical Tape, 2x2 (in/yd) Compression Wrap Compression Stockings Add-Ons Electronic Signature(s) Signed: 12/12/2021 12:27:27 PM By: Mikey Bussing,  Shanda Bumps DO Entered By: Geralyn Corwin on 12/12/2021 12:08:57 Decarlo, Binnie Rail ([id number]) -------------------------------------------------------------------------------- Multi-Disciplinary Care Plan Details Patient Name: Raptis, Linley B. Date of Service: 12/12/2021 10:00 AM Medical Record Number: [id number] Patient Account Number: [id number] Date of Birth/Sex: Apr 04, 1956 (66 y.o. F) Treating RN: Huel Coventry Primary Care Isobelle Tuckett: [name] Other Clinician: Referring Laramie Meissner: [name] Treating Alric Geise/Extender: Tilda Franco in Treatment: 0 Active Inactive Orientation to the Wound Care Program Nursing Diagnoses: Knowledge deficit related to the wound healing center program Goals: Patient/caregiver will verbalize understanding of the Wound Healing Center Program Date Initiated: 12/12/2021 Target Resolution Date: 12/12/2021 Goal Status: Active Interventions: Provide education on orientation to the wound center Notes: Soft  Tissue Infection Nursing Diagnoses: Impaired tissue integrity Knowledge deficit related to disease process and management Knowledge deficit related to home infection control: handwashing, handling of soiled dressings, supply storage Potential for infection: soft tissue Goals: Patient will remain free of wound infection Date Initiated: 12/12/2021 Target Resolution Date: 12/12/2021 Goal Status: Active Patient's soft tissue infection will resolve Date Initiated: 12/12/2021 Target Resolution Date: 12/12/2021 Goal Status: Active Signs and symptoms of infection will be recognized early to allow for prompt treatment Date Initiated: 12/12/2021 Target Resolution Date: 12/12/2021 Goal Status: Active Interventions: Assess signs and symptoms of infection every visit Provide education on infection Notes: Wound/Skin Impairment Nursing Diagnoses: Impaired tissue integrity Knowledge deficit related to smoking impact on wound healing Knowledge deficit related to ulceration/compromised skin integrity Goals: Patient/caregiver will verbalize understanding of skin care regimen Date Initiated: 12/12/2021 Target Resolution Date: 12/12/2021 Goal Status: Active Ulcer/skin breakdown will have a volume reduction of 30% by week 4 Date Initiated: 12/12/2021 Target Resolution Date: 01/02/2022 XOCHIL, SHANKER ([id number]) Goal Status: Active Interventions: Assess patient/caregiver ability to obtain necessary supplies Assess patient/caregiver ability to perform ulcer/skin care regimen upon admission and as needed Assess ulceration(s) every visit Treatment Activities: Skin care regimen initiated : 12/12/2021 Topical wound management initiated : 12/12/2021 Notes: Electronic Signature(s) Signed: 12/13/2021 10:16:02 AM By: Elliot Gurney, BSN, RN, CWS, Kim RN, BSN Entered By: Elliot Gurney, BSN, RN, CWS, Kim on 12/12/2021 10:25:38 Mix, Binnie Rail  ([id number]) -------------------------------------------------------------------------------- Pain Assessment Details Patient Name: Creekmore, Seana B. Date of Service: 12/12/2021 10:00 AM Medical Record Number: [id number] Patient Account Number: [id number] Date of Birth/Sex: September 29, 1955 (66 y.o. F) Treating RN: Huel Coventry Primary Care Zandrea Kenealy: [name] Other Clinician: Referring Okey Zelek: [name] Treating Ikaika Showers/Extender: Tilda Franco in Treatment: 0 Active Problems Location of Pain Severity and Description of Pain Patient Has Paino No Site Locations Pain Management and Medication Current Pain Management: Electronic Signature(s) Signed: 12/13/2021 10:16:02 AM By: Elliot Gurney, BSN, RN, CWS, Kim RN, BSN Entered By: Elliot Gurney, BSN, RN, CWS, Kim on 12/12/2021 09:55:25 Poudrier, Binnie Rail ([id number]) -------------------------------------------------------------------------------- Patient/Caregiver Education Details Patient Name: Douville, [name] Date of Service: 12/12/2021 10:00 AM Medical Record Number: [id number] Patient Account Number: [id number] Date of Birth/Gender: [birth year] (66 y.o. F) Treating RN: Huel Coventry Primary Care Physician: [name] Other Clinician: Referring Physician: [name] Treating Physician/Extender: Tilda Franco in Treatment: 0 Education Assessment Education Provided To: Patient Education Topics Provided Infection: Handouts: Infection Prevention and Management Methods: Demonstration, Explain/Verbal Responses: State content correctly Welcome To The Wound Care Center: Handouts: Welcome To The Wound Care Center Methods: Demonstration Responses: State content correctly Electronic Signature(s) Signed: 12/13/2021 10:16:02 AM By: Elliot Gurney, BSN, RN, CWS, Kim RN, BSN Entered By: Elliot Gurney, BSN, RN, CWS, Kim on 12/12/2021 10:44:35 Bouvier, Binnie Rail  ([id number]) -------------------------------------------------------------------------------- Wound Assessment Details Patient Name: Bau, Maitri B. Date of  Service: 12/12/2021 10:00 AM Medical Record Number: [id number] Patient Account Number: [id number] Date of Birth/Sex: [birth year] (66 y.o. F) Treating RN: Huel Coventry Primary Care Tahani Potier: [name] Other Clinician: Referring Chattie Greeson: [name] Treating Euan Wandler/Extender: Tilda Franco in Treatment: 0 Wound Status Wound Number: 1 Primary Etiology: Abscess Wound Location: Midline Chest Wound Status: Open Wounding Event: Gradually Appeared Comorbid History: Hypertension Date Acquired: 10/12/2021 Weeks Of Treatment: 0 Clustered Wound: No Photos Wound Measurements Length: (cm) 0.5 Width: (cm) 0.3 Depth: (cm) 0.3 Area: (cm) 0.118 Volume: (cm) 0.035 % Reduction in Area: 0% % Reduction in Volume: 0% Undermining: Yes Starting Position (o'clock): 9 Ending Position (o'clock): 1 Maximum Distance: (cm) 0.5 Wound Description Classification: Full Thickness Without Exposed Support Structu Wound Margin: Flat and Intact Exudate Amount: Medium Exudate Type: Serous Exudate Color: amber res Foul Odor After Cleansing: No Slough/Fibrino Yes Wound Bed Granulation Amount: Medium (34-66%) Exposed Structure Granulation Quality: Pale Fascia Exposed: No Necrotic Amount: Medium (34-66%) Fat Layer (Subcutaneous Tissue) Exposed: Yes Necrotic Quality: Adherent Slough Tendon Exposed: No Muscle Exposed: No Joint Exposed: No Bone Exposed: No Treatment Notes Wound #1 (Chest) Wound Laterality: Midline Cleanser Soap and Water Arvelo, Ailynn B. ([id number]) Discharge Instruction: Gently cleanse wound with antibacterial soap, rinse and pat dry prior to dressing wounds Peri-Wound Care Topical Primary Dressing Iodoform 1/4x 5 (in/yd) Discharge Instruction: Apply Iodoform Packing Strip as instructed. Secondary  Dressing Gauze Discharge Instruction: As directed: dry, moistened with saline or moistened with Dakins Solution Secured With Medipore Tape - 12M Medipore H Soft Cloth Surgical Tape, 2x2 (in/yd) Compression Wrap Compression Stockings Add-Ons Electronic Signature(s) Signed: 12/13/2021 10:16:02 AM By: Elliot Gurney, BSN, RN, CWS, Kim RN, BSN Entered By: Elliot Gurney, BSN, RN, CWS, Kim on 12/12/2021 10:10:30 Pagel, Binnie Rail ([id number]) -------------------------------------------------------------------------------- Vitals Details Patient Name: Lauf, Liba B. Date of Service: 12/12/2021 10:00 AM Medical Record Number: [id number] Patient Account Number: [id number] Date of Birth/Sex: Nov 24, 1955 (66 y.o. F) Treating RN: Huel Coventry Primary Care Darl Brisbin: [name] Other Clinician: Referring Adina Puzzo: [name] Treating Refugia Laneve/Extender: Tilda Franco in Treatment: 0 Vital Signs Time Taken: 09:50 Temperature (F): 98.3 Height (in): 65 Pulse (bpm): 83 Weight (lbs): 141 Respiratory Rate (breaths/min): 16 Body Mass Index (BMI): 23.5 Blood Pressure (mmHg): 132/81 Reference Range: 80 - 120 mg / dl Electronic Signature(s) Signed: 12/13/2021 10:16:02 AM By: Elliot Gurney, BSN, RN, CWS, Kim RN, BSN Entered By: Elliot Gurney, BSN, RN, CWS, Kim on 12/12/2021 09:56:14

## 2021-12-13 NOTE — Progress Notes (Signed)
SHANDALE, MALAK ([id number]) Visit Report for 12/12/2021 Abuse Risk Screen Details Patient Name: [name], [name] Date of Service: 12/12/2021 10:00 AM Medical Record Number: [id number] Patient Account Number: [id number] Date of Birth/Sex: 1956-06-20 (66 y.o. F) Treating RN: Huel Coventry Primary Care Yael Angerer: [name] Other Clinician: Referring Alecxis Baltzell: [name] Treating Roscoe Witts/Extender: Tilda Franco in Treatment: 0 Abuse Risk Screen Items Answer ABUSE RISK SCREEN: Has anyone close to you tried to hurt or harm you recentlyo No Do you feel uncomfortable with anyone in your familyo No Has anyone forced you do things that you didnot want to doo No Electronic Signature(s) Signed: 12/13/2021 10:16:02 AM By: Elliot Gurney, BSN, RN, CWS, Kim RN, BSN Entered By: Elliot Gurney, BSN, RN, CWS, Kim on 12/12/2021 10:03:42 [name], [name] ([id number]) -------------------------------------------------------------------------------- Activities of Daily Living Details Patient Name: Hott, Kasidi B. Date of Service: 12/12/2021 10:00 AM Medical Record Number: [id number] Patient Account Number: [id number] Date of Birth/Sex: Feb 20, 1956 (66 y.o. F) Treating RN: Huel Coventry Primary Care Shadia Larose: [name] Other Clinician: Referring Estephanie Hubbs: [name] Treating Kashish Yglesias/Extender: Tilda Franco in Treatment: 0 Activities of Daily Living Items Answer Activities of Daily Living (Please select one for each item) Drive Automobile Not Able Take Medications Need Assistance Use Telephone Completely Able Care for Appearance Completely Able Use Toilet Completely Able Bath / Shower Completely Able Dress Self Completely Able Feed Self Completely Able Walk Completely Able Get In / Out Bed Completely Able Housework Need Assistance Prepare Meals Need Assistance Handle Money Need Assistance Shop for Self Need Assistance Electronic Signature(s) Signed:  12/13/2021 10:16:02 AM By: Elliot Gurney, BSN, RN, CWS, Kim RN, BSN Entered By: Elliot Gurney, BSN, RN, CWS, Kim on 12/12/2021 10:04:49 Sabas, [name] ([id number]) -------------------------------------------------------------------------------- Education Screening Details Patient Name: [name], [name] Date of Service: 12/12/2021 10:00 AM Medical Record Number: [id number] Patient Account Number: [id number] Date of Birth/Sex: [birth year] (66 y.o. F) Treating RN: Huel Coventry Primary Care Donnelle Rubey: [name] Other Clinician: Referring Maison Kestenbaum: [name] Treating Karime Scheuermann/Extender: Tilda Franco in Treatment: 0 Primary Learner Assessed: Patient Learning Preferences/Education Level/Primary Language Learning Preference: Explanation, Demonstration Highest Education Level: College or Above Preferred Language: English Cognitive Barrier Language Barrier: No Translator Needed: No Memory Deficit: No Emotional Barrier: No Physical Barrier Impaired Vision: Yes Legally Blind Impaired Hearing: No Decreased Hand dexterity: No Knowledge/Comprehension Knowledge Level: High Comprehension Level: High Ability to understand written instructions: High Ability to understand verbal instructions: High Motivation Anxiety Level: Calm Cooperation: Cooperative Education Importance: Acknowledges Need Interest in Health Problems: Asks Questions Perception: Coherent Willingness to Engage in Self-Management High Activities: Readiness to Engage in Self-Management High Activities: Electronic Signature(s) Signed: 12/13/2021 10:16:02 AM By: Elliot Gurney, BSN, RN, CWS, Kim RN, BSN Entered By: Elliot Gurney, BSN, RN, CWS, Kim on 12/12/2021 10:05:29 [name], [name] ([id number]) -------------------------------------------------------------------------------- Fall Risk Assessment Details Patient Name: [name], [name] Date of Service: 12/12/2021 10:00 AM Medical Record Number: [id number] Patient Account  Number: [id number] Date of Birth/Sex: 01-11-1956 (66 y.o. F) Treating RN: Huel Coventry Primary Care Madyn Ivins: [name] Other Clinician: Referring Martrice Apt: [name] Treating Conchetta Lamia/Extender: Tilda Franco in Treatment: 0 Fall Risk Assessment Items Have you had 2 or more falls in the last 12 monthso 0 No Have you had any fall that resulted in injury in the last 12 monthso 0 No FALLS RISK SCREEN History of falling - immediate or within 3 months 0 No Secondary diagnosis (Do you have 2 or more medical diagnoseso) 0 No Ambulatory aid None/bed rest/wheelchair/nurse 0 No Crutches/cane/walker 15 Yes Furniture 0  No Intravenous therapy Access/Saline/Heparin Lock 0 No Gait/Transferring Normal/ bed rest/ wheelchair 0 No Weak (short steps with or without shuffle, stooped but able to lift head while walking, may 10 Yes seek support from furniture) Impaired (short steps with shuffle, may have difficulty arising from chair, head down, impaired 0 No balance) Mental Status Oriented to own ability 0 No Electronic Signature(s) Signed: 12/13/2021 10:16:02 AM By: Elliot Gurney, BSN, RN, CWS, Kim RN, BSN Entered By: Elliot Gurney, BSN, RN, CWS, Kim on 12/12/2021 10:05:52 [name], [name] ([id number]) -------------------------------------------------------------------------------- Foot Assessment Details Patient Name: [name], [name] Date of Service: 12/12/2021 10:00 AM Medical Record Number: [id number] Patient Account Number: [id number] Date of Birth/Sex: June 08, 1956 (65 y.o. F) Treating RN: Huel Coventry Primary Care Nasim Habeeb: [name] Other Clinician: Referring Keziyah Kneale: [name] Treating Ryhanna Dunsmore/Extender: Tilda Franco in Treatment: 0 Foot Assessment Items Site Locations + = Sensation present, - = Sensation absent, C = Callus, U = Ulcer R = Redness, W = Warmth, M = Maceration, PU = Pre-ulcerative lesion F = Fissure, S = Swelling, D =  Dryness Assessment Right: Left: Other Deformity: No No Prior Foot Ulcer: No No Prior Amputation: No No Charcot Joint: No No Ambulatory Status: Ambulatory Without Help Gait: Steady Electronic Signature(s) Signed: 12/13/2021 10:16:02 AM By: Elliot Gurney, BSN, RN, CWS, Kim RN, BSN Entered By: Elliot Gurney, BSN, RN, CWS, Kim on 12/12/2021 10:06:12 [name], [name] ([id number]) -------------------------------------------------------------------------------- Nutrition Risk Screening Details Patient Name: [name], [name] Date of Service: 12/12/2021 10:00 AM Medical Record Number: [id number] Patient Account Number: [id number] Date of Birth/Sex: 15-Aug-1955 (66 y.o. F) Treating RN: Huel Coventry Primary Care Omolara Carol: [name] Other Clinician: Referring Aidenjames Heckmann: [name] Treating Bellarae Lizer/Extender: Tilda Franco in Treatment: 0 Height (in): 65 Weight (lbs): 141 Body Mass Index (BMI): 23.5 Nutrition Risk Screening Items Score Screening NUTRITION RISK SCREEN: I have an illness or condition that made me change the kind and/or amount of food I eat 0 No I eat fewer than two meals per day 0 No I eat few fruits and vegetables, or milk products 0 No I have three or more drinks of beer, liquor or wine almost every day 0 No I have tooth or mouth problems that make it hard for me to eat 0 No I don't always have enough money to buy the food I need 0 No I eat alone most of the time 0 No I take three or more different prescribed or over-the-counter drugs a day 1 Yes Without wanting to, I have lost or gained 10 pounds in the last six months 0 No I am not always physically able to shop, cook and/or feed myself 0 No Nutrition Protocols Good Risk Protocol 0 No interventions needed Moderate Risk Protocol High Risk Proctocol Risk Level: Good Risk Score: 1 Electronic Signature(s) Signed: 12/13/2021 10:16:02 AM By: Elliot Gurney, BSN, RN, CWS, Kim RN, BSN Entered By: Elliot Gurney, BSN, RN, CWS, Kim  on 12/12/2021 10:06:04

## 2021-12-14 ENCOUNTER — Other Ambulatory Visit: Payer: Self-pay | Admitting: Family Medicine

## 2021-12-14 DIAGNOSIS — K219 Gastro-esophageal reflux disease without esophagitis: Secondary | ICD-10-CM

## 2021-12-14 DIAGNOSIS — E782 Mixed hyperlipidemia: Secondary | ICD-10-CM

## 2021-12-14 DIAGNOSIS — J432 Centrilobular emphysema: Secondary | ICD-10-CM

## 2021-12-14 DIAGNOSIS — R32 Unspecified urinary incontinence: Secondary | ICD-10-CM

## 2021-12-14 DIAGNOSIS — M8589 Other specified disorders of bone density and structure, multiple sites: Secondary | ICD-10-CM

## 2021-12-14 DIAGNOSIS — G35 Multiple sclerosis: Secondary | ICD-10-CM

## 2021-12-14 DIAGNOSIS — G894 Chronic pain syndrome: Secondary | ICD-10-CM

## 2021-12-14 DIAGNOSIS — N319 Neuromuscular dysfunction of bladder, unspecified: Secondary | ICD-10-CM

## 2021-12-17 NOTE — Telephone Encounter (Signed)
Called pt to make appointment  - Mailbox is full  - unable to [facility]

## 2021-12-19 ENCOUNTER — Encounter: Payer: Medicare Other | Admitting: Internal Medicine

## 2021-12-19 ENCOUNTER — Ambulatory Visit: Payer: 59 | Admitting: Physical Therapy

## 2021-12-19 DIAGNOSIS — G35 Multiple sclerosis: Secondary | ICD-10-CM | POA: Diagnosis not present

## 2021-12-19 DIAGNOSIS — S21109A Unspecified open wound of unspecified front wall of thorax without penetration into thoracic cavity, initial encounter: Secondary | ICD-10-CM | POA: Diagnosis not present

## 2021-12-19 DIAGNOSIS — L728 Other follicular cysts of the skin and subcutaneous tissue: Secondary | ICD-10-CM | POA: Diagnosis not present

## 2021-12-20 ENCOUNTER — Ambulatory Visit: Admission: RE | Admit: 2021-12-20 | Payer: Medicare Other | Source: Ambulatory Visit

## 2021-12-20 NOTE — Progress Notes (Signed)
ELYSSA, PENDELTON ([id number]) Visit Report for 12/19/2021 Arrival Information Details Patient Name: Bickle, Tiarrah B. Date of Service: 12/19/2021 1:00 PM Medical Record Number: [id number] Patient Account Number: [id number] Date of Birth/Sex: 1955/09/12 (66 y.o. F) Treating RN: Angelina Pih Primary Care Nyna Chilton: [name] Other Clinician: Betha Loa Referring Katina Remick: [name] Treating Cariann Kinnamon/Extender: Tilda Franco in Treatment: 1 Visit Information History Since Last Visit All ordered tests and consults were completed: No Patient Arrived: Dan Humphreys Added or deleted any medications: No Arrival Time: 13:13 Any new allergies or adverse reactions: No Transfer Assistance: None Had a fall or experienced change in No Patient Has Alerts: Yes activities of daily living that may affect Patient Alerts: Patient on Blood Thinner risk of falls: Not Diabetic Hospitalized since last visit: No Pain Present Now: No Electronic Signature(s) Signed: 12/19/2021 3:30:30 PM By: Betha Loa Entered By: Betha Loa on 12/19/2021 13:14:14 Gendreau, Jahyra B. ([id number]) -------------------------------------------------------------------------------- Clinic Level of Care Assessment Details Patient Name: Alaniz, Jonna B. Date of Service: 12/19/2021 1:00 PM Medical Record Number: [id number] Patient Account Number: [id number] Date of Birth/Sex: 09-16-1955 (66 y.o. F) Treating RN: Angelina Pih Primary Care Atisha Hamidi: [name] Other Clinician: Betha Loa Referring Indio Santilli: [name] Treating Kaylani Fromme/Extender: Tilda Franco in Treatment: 1 Clinic Level of Care Assessment Items TOOL 4 Quantity Score []  - Use when only an EandM is performed on FOLLOW-UP visit 0 ASSESSMENTS - Nursing Assessment / Reassessment X - Reassessment of Co-morbidities (includes updates in patient status) 1 10 X- 1 5 Reassessment of Adherence to  Treatment Plan ASSESSMENTS - Wound and Skin Assessment / Reassessment X - Simple Wound Assessment / Reassessment - one wound 1 5 []  - 0 Complex Wound Assessment / Reassessment - multiple wounds []  - 0 Dermatologic / Skin Assessment (not related to wound area) ASSESSMENTS - Focused Assessment []  - Circumferential Edema Measurements - multi extremities 0 []  - 0 Nutritional Assessment / Counseling / Intervention []  - 0 Lower Extremity Assessment (monofilament, tuning fork, pulses) []  - 0 Peripheral Arterial Disease Assessment (using hand held doppler) ASSESSMENTS - Ostomy and/or Continence Assessment and Care []  - Incontinence Assessment and Management 0 []  - 0 Ostomy Care Assessment and Management (repouching, etc.) PROCESS - Coordination of Care X - Simple Patient / Family Education for ongoing care 1 15 []  - 0 Complex (extensive) Patient / Family Education for ongoing care X- 1 10 Staff obtains Consents, Records, Test Results / Process Orders []  - 0 Staff telephones HHA, Nursing Homes / Clarify orders / etc []  - 0 Routine Transfer to another Facility (non-emergent condition) []  - 0 Routine Hospital Admission (non-emergent condition) []  - 0 New Admissions / / Ordering NPWT, Apligraf, etc. []  - 0 Emergency Hospital Admission (emergent condition) X- 1 10 Simple Discharge Coordination []  - 0 Complex (extensive) Discharge Coordination PROCESS - Special Needs []  - Pediatric / Minor Patient Management 0 []  - 0 Isolation Patient Management []  - 0 Hearing / Language / Visual special needs []  - 0 Assessment of Community assistance (transportation, D/C planning, etc.) []  - 0 Additional assistance / Altered mentation []  - 0 Support Surface(s) Assessment (bed, cushion, seat, etc.) INTERVENTIONS - Wound Cleansing / Measurement Hedeen, Lile B. ( ) X- 1 5 Simple Wound Cleansing - one wound []  - 0 Complex Wound Cleansing - multiple wounds X- 1  5 Wound Imaging (photographs - any number of wounds) []  - 0 Wound Tracing (instead of photographs) X- 1 5 Simple Wound Measurement - one wound []  - 0  Complex Wound Measurement - multiple wounds INTERVENTIONS - Wound Dressings []  - Small Wound Dressing one or multiple wounds 0 X- 1 15 Medium Wound Dressing one or multiple wounds []  - 0 Large Wound Dressing one or multiple wounds []  - 0 Application of Medications - topical []  - 0 Application of Medications - injection INTERVENTIONS - Miscellaneous []  - External ear exam 0 []  - 0 Specimen Collection (cultures, biopsies, blood, body fluids, etc.) []  - 0 Specimen(s) / Culture(s) sent or taken to Lab for analysis []  - 0 Patient Transfer (multiple staff / / Similar devices) []  - 0 Simple Staple / Suture removal (25 or less) []  - 0 Complex Staple / Suture removal (26 or more) []  - 0 Hypo / Hyperglycemic Management (close monitor of Blood Glucose) []  - 0 Ankle / Brachial Index (ABI) - do not check if billed separately X- 1 5 Vital Signs Has the patient been seen at the hospital within the last three years: Yes Total Score: 90 Level Of Care: New/Established - Level 3 Electronic Signature(s) Signed: 12/19/2021 3:30:30 PM By: Entered By: on 12/19/2021 14:03:10 Besson, Lynnel B. ( ) -------------------------------------------------------------------------------- Encounter Discharge Information Details Patient Name: Santalucia, Karolyne B. Date of Service: 12/19/2021 1:00 PM Medical Record Number: Patient Account Number: Nurse, adult Date of Birth/Sex: [birth year] (66 y.o. F) Treating RN: Primary Care Keyry Iracheta: Other Clinician: 12/21/2021 Referring Bracken Moffa: Betha Loa Treating Bernerd Terhune/Extender: Betha Loa in Treatment: 1 Encounter Discharge Information Items Discharge Condition: Stable Ambulatory Status:  Walker Discharge Destination: Home Transportation: Private Auto Accompanied By: self Schedule Follow-up Appointment: Yes Clinical Summary of Care: Electronic Signature(s) Signed: 12/19/2021 3:30:30 PM By: [id number] Entered By: 12/21/2021 on 12/19/2021 14:07:09 Starkey, Brecklyn B. ([id number]) -------------------------------------------------------------------------------- Lower Extremity Assessment Details Patient Name: Nakagawa, Xiara B. Date of Service: 12/19/2021 1:00 PM Medical Record Number: ([id number] Patient Account Number: Angelina Pih Date of Birth/Sex: 1956-04-10 (66 y.o. F) Treating RN: [name] Primary Care Vandy Tsuchiya: Tilda Franco Other Clinician: 12/21/2021 Referring Jakeel Starliper: Betha Loa Treating Samhitha Rosen/Extender: Betha Loa in Treatment: 1 Electronic Signature(s) Signed: 12/19/2021 3:30:30 PM By: [id number] Signed: 12/19/2021 4:35:40 PM By: [id number] Entered By: [id number] on 12/19/2021 13:24:31 Wiles, Louana B. ((63) -------------------------------------------------------------------------------- Multi Wound Chart Details Patient Name: Bonham, Acacia B. Date of Service: 12/19/2021 1:00 PM Medical Record Number: [name] Patient Account Number: Betha Loa Date of Birth/Sex: 12-Oct-1955 (66 y.o. F) Treating RN: 12/21/2021 Primary Care Cj Edgell: Betha Loa Other Clinician: 12/21/2021 Referring Bren Steers: Angelina Pih Treating Annamarie Yamaguchi/Extender: Betha Loa in Treatment: 1 Vital Signs Height(in): 65 Pulse(bpm): 73 Weight(lbs): 141 Blood Pressure(mmHg): 123/80 Body Mass Index(BMI): 23.5 Temperature(F): 98.2 Respiratory Rate(breaths/min): 18 Photos: [N/A:N/A] Wound Location: Midline Chest N/A N/A Wounding Event: Gradually Appeared N/A N/A Primary Etiology: Abscess N/A N/A Comorbid History: Hypertension N/A N/A Date Acquired: 10/12/2021 N/A N/A Weeks of  Treatment: 1 N/A N/A Wound Status: Open N/A N/A Wound Recurrence: No N/A N/A Measurements L x W x D (cm) 0.3x0.2x0.2 N/A N/A Area (cm) : 0.047 N/A N/A Volume (cm) : 0.009 N/A N/A % Reduction in Area: 60.20% N/A N/A % Reduction in Volume: 74.30% N/A N/A Starting Position 1 (o'clock): 9 Ending Position 1 (o'clock): 4 Maximum Distance 1 (cm): 0.4 Undermining: Yes N/A N/A Classification: Full Thickness Without Exposed N/A N/A Support Structures Exudate Amount: Medium N/A N/A Exudate Type: Serous N/A N/A Exudate Color: amber N/A N/A Wound Margin: Flat and Intact N/A N/A Granulation Amount: Medium (34-66%) N/A  N/A Granulation Quality: Pale N/A N/A Necrotic Amount: Medium (34-66%) N/A N/A Exposed Structures: Fat Layer (Subcutaneous Tissue): N/A N/A Yes Fascia: No Tendon: No Muscle: No Joint: No Bone: No Epithelialization: Small (1-33%) N/A N/A Treatment Notes Electronic Signature(s) Signed: 12/19/2021 3:30:30 PM By: Lowry Ram, Binnie Rail ([id number]) Entered By: Betha Loa on 12/19/2021 13:24:42 Gamblin, Binnie Rail ([id number]) -------------------------------------------------------------------------------- Multi-Disciplinary Care Plan Details Patient Name: Pargas, Ashlin B. Date of Service: 12/19/2021 1:00 PM Medical Record Number: [id number] Patient Account Number: [id number] Date of Birth/Sex: 01/18/1956 (66 y.o. F) Treating RN: Angelina Pih Primary Care Ludwika Rodd: [name] Other Clinician: Betha Loa Referring Zoria Rawlinson: [name] Treating Marieli Rudy/Extender: Tilda Franco in Treatment: 1 Active Inactive Orientation to the Wound Care Program Nursing Diagnoses: Knowledge deficit related to the wound healing center program Goals: Patient/caregiver will verbalize understanding of the Wound Healing Center Program Date Initiated: 12/12/2021 Target Resolution Date: 12/12/2021 Goal Status: Active Interventions: Provide  education on orientation to the wound center Notes: Soft Tissue Infection Nursing Diagnoses: Impaired tissue integrity Knowledge deficit related to disease process and management Knowledge deficit related to home infection control: handwashing, handling of soiled dressings, supply storage Potential for infection: soft tissue Goals: Patient will remain free of wound infection Date Initiated: 12/12/2021 Target Resolution Date: 12/12/2021 Goal Status: Active Patient's soft tissue infection will resolve Date Initiated: 12/12/2021 Target Resolution Date: 12/12/2021 Goal Status: Active Signs and symptoms of infection will be recognized early to allow for prompt treatment Date Initiated: 12/12/2021 Target Resolution Date: 12/12/2021 Goal Status: Active Interventions: Assess signs and symptoms of infection every visit Provide education on infection Treatment Activities: Education provided on Infection : 12/12/2021 Notes: Wound/Skin Impairment Nursing Diagnoses: Impaired tissue integrity Knowledge deficit related to smoking impact on wound healing Knowledge deficit related to ulceration/compromised skin integrity Goals: Patient/caregiver will verbalize understanding of skin care regimen Date Initiated: 12/12/2021 Target Resolution Date: 12/12/2021 Goal Status: Active Passow, Marelyn B. ([id number]) Ulcer/skin breakdown will have a volume reduction of 30% by week 4 Date Initiated: 12/12/2021 Target Resolution Date: 01/02/2022 Goal Status: Active Interventions: Assess patient/caregiver ability to obtain necessary supplies Assess patient/caregiver ability to perform ulcer/skin care regimen upon admission and as needed Assess ulceration(s) every visit Treatment Activities: Skin care regimen initiated : 12/12/2021 Topical wound management initiated : 12/12/2021 Notes: Electronic Signature(s) Signed: 12/19/2021 3:30:30 PM By: Betha Loa Signed: 12/19/2021 4:35:40 PM By: Angelina Pih Entered By: Betha Loa on 12/19/2021 13:24:35 Leedom, Carollynn B. ([id number]) -------------------------------------------------------------------------------- Pain Assessment Details Patient Name: Hocevar, [name] Date of Service: 12/19/2021 1:00 PM Medical Record Number: [id number] Patient Account Number: [id number] Date of Birth/Sex: Aug 09, 1955 (66 y.o. F) Treating RN: Angelina Pih Primary Care Malakai Schoenherr: [name] Other Clinician: Betha Loa Referring Shealeigh Dunstan: [name] Treating Damarious Holtsclaw/Extender: Tilda Franco in Treatment: 1 Active Problems Location of Pain Severity and Description of Pain Patient Has Paino Yes Site Locations Pain Management and Medication Current Pain Management: Electronic Signature(s) Signed: 12/19/2021 3:30:30 PM By: Betha Loa Signed: 12/19/2021 4:35:40 PM By: Angelina Pih Entered By: Betha Loa on 12/19/2021 13:16:25 Malesky, Binnie Rail ([id number]) -------------------------------------------------------------------------------- Patient/Caregiver Education Details Patient Name: Brightwell, [name] Date of Service: 12/19/2021 1:00 PM Medical Record Number: [id number] Patient Account Number: [id number] Date of Birth/Gender: Nov 04, 1955 (66 y.o. F) Treating RN: Angelina Pih Primary Care Physician: [name] Other Clinician: Betha Loa Referring Physician: [name] Treating Physician/Extender: Tilda Franco in Treatment: 1 Education Assessment Education Provided To: Patient Education Topics Provided Wound/Skin Impairment: Handouts: Other: continue wound care as directed Electronic Signature(s)  Signed: 12/19/2021 3:30:30 PM By: Betha Loa Entered By: Betha Loa on 12/19/2021 14:06:29 Waddell, Yeny BMarland Kitchen ([id number]) -------------------------------------------------------------------------------- Wound Assessment Details Patient Name: Clymer, Evalie  B. Date of Service: 12/19/2021 1:00 PM Medical Record Number: [id number] Patient Account Number: [id number] Date of Birth/Sex: [birth year] (66 y.o. F) Treating RN: Angelina Pih Primary Care Daxx Tiggs: [name] Other Clinician: Betha Loa Referring Mateen Franssen: [name] Treating [place]/Extender: Tilda Franco in Treatment: 1 Wound Status Wound Number: 1 Primary Etiology: Abscess Wound Location: Midline Chest Wound Status: Open Wounding Event: Gradually Appeared Comorbid History: Hypertension Date Acquired: 10/12/2021 Weeks Of Treatment: 1 Clustered Wound: No Photos Wound Measurements Length: (cm) 0.3 Width: (cm) 0.2 Depth: (cm) 0.2 Area: (cm) 0.047 Volume: (cm) 0.009 % Reduction in Area: 60.2% % Reduction in Volume: 74.3% Epithelialization: Small (1-33%) Tunneling: No Undermining: Yes Starting Position (o'clock): 9 Ending Position (o'clock): 4 Maximum Distance: (cm) 0.4 Wound Description Classification: Full Thickness Without Exposed Support Structu Wound Margin: Flat and Intact Exudate Amount: Medium Exudate Type: Serous Exudate Color: amber res Foul Odor After Cleansing: No Slough/Fibrino Yes Wound Bed Granulation Amount: Medium (34-66%) Exposed Structure Granulation Quality: Pale Fascia Exposed: No Necrotic Amount: Medium (34-66%) Fat Layer (Subcutaneous Tissue) Exposed: Yes Necrotic Quality: Adherent Slough Tendon Exposed: No Muscle Exposed: No Joint Exposed: No Bone Exposed: No Treatment Notes Wound #1 (Chest) Wound Laterality: Midline Cleanser Moskowitz, Tienna B. ([id number]) Soap and Water Discharge Instruction: Gently cleanse wound with antibacterial soap, rinse and pat dry prior to dressing wounds Peri-Wound Care Topical Primary Dressing Medihoney - Gel, 1.5 (oz), tube Secondary Dressing Coverlet Latex-Free Fabric Adhesive Dressings Discharge Instruction: 1.5 x 2 Gauze Discharge Instruction: As directed:  dry, moistened with saline or moistened with Dakins Solution Secured With Compression Wrap Compression Stockings Add-Ons Electronic Signature(s) Signed: 12/19/2021 3:30:30 PM By: Betha Loa Signed: 12/19/2021 4:35:40 PM By: Angelina Pih Entered By: Betha Loa on 12/19/2021 13:24:04 Symanski, Santa B. ([id number]) -------------------------------------------------------------------------------- Vitals Details Patient Name: Bayard, [name] Date of Service: 12/19/2021 1:00 PM Medical Record Number: [id number] Patient Account Number: [id number] Date of Birth/Sex: Dec 19, 1955 (66 y.o. F) Treating RN: Angelina Pih Primary Care Deon Duer: [name] Other Clinician: Betha Loa Referring Newt Levingston: [name] Treating Foye Damron/Extender: Tilda Franco in Treatment: 1 Vital Signs Time Taken: 13:14 Temperature (F): 98.2 Height (in): 65 Pulse (bpm): 73 Weight (lbs): 141 Respiratory Rate (breaths/min): 18 Body Mass Index (BMI): 23.5 Blood Pressure (mmHg): 123/80 Reference Range: 80 - 120 mg / dl Electronic Signature(s) Signed: 12/19/2021 3:30:30 PM By: Betha Loa Entered By: Betha Loa on 12/19/2021 13:15:55

## 2021-12-20 NOTE — Progress Notes (Signed)
[name], [name] ([id number]) Visit Report for 12/19/2021 Chief Complaint Document Details Patient Name: Benson, [name] Date of Service: 12/19/2021 1:00 PM Medical Record Number: [id number] Patient Account Number: [id number] Date of Birth/Sex: 11-04-1955 (66 y.o. F) Treating RN: Angelina Pih Primary Care Provider: [name] Other Clinician: Betha Loa Referring Provider: [name] Treating Provider/Extender: Tilda Franco in Treatment: 1 Information Obtained from: Patient Chief Complaint 12/12/2021; chest wound Electronic Signature(s) Signed: 12/19/2021 1:59:52 PM By: Geralyn Corwin DO Entered By: Geralyn Corwin on 12/19/2021 13:55:36 Wages, Binnie Rail ([id number]) -------------------------------------------------------------------------------- HPI Details Patient Name: Vicki Munch B. Date of Service: 12/19/2021 1:00 PM Medical Record Number: [id number] Patient Account Number: [id number] Date of Birth/Sex: Mar 14, 1956 (66 y.o. F) Treating RN: Angelina Pih Primary Care Provider: [name] Other Clinician: Betha Loa Referring Provider: [name] Treating Provider/Extender: Tilda Franco in Treatment: 1 History of Present Illness HPI Description: Admission 12/12/2021 [name] is a 66 year old female with a past medical history of multiple sclerosis that presents the clinic for a wound to her chest. She states she had a closed raised area to the site for several years. She states that about 2 months ago this open and ruptured. She states she had a lot of thick drainage. She has been on doxycycline For this issue 1 month ago. She states it still drains serous fluid. She currently denies signs of infection. 6/28; patient presents for follow-up. We started iodoform packing at last clinic visit. She states that is difficult to pack and has not been able to do this for the past few days. She currently  denies signs of infection. She states she has her ultrasound scheduled for tomorrow. Electronic Signature(s) Signed: 12/19/2021 1:59:52 PM By: Geralyn Corwin DO Entered By: Geralyn Corwin on 12/19/2021 13:56:17 Ayotte, Binnie Rail ([id number]) -------------------------------------------------------------------------------- Physical Exam Details Patient Name: [name], [name] Date of Service: 12/19/2021 1:00 PM Medical Record Number: [id number] Patient Account Number: [id number] Date of Birth/Sex: 19-Feb-1956 (66 y.o. F) Treating RN: Angelina Pih Primary Care Provider: [name] Other Clinician: Betha Loa Referring Provider: [name] Treating Provider/Extender: Tilda Franco in Treatment: 1 Constitutional . Psychiatric . Notes Chest wall: To the mid xiphoid region next to the left breath there is an open wound with undermining from the 10 to 1 o'clock position. Granulation tissue at the opening. No drainage noted. No necrotic debris present. Induration has improved. No signs of surrounding infection. Electronic Signature(s) Signed: 12/19/2021 1:59:52 PM By: Geralyn Corwin DO Entered By: Geralyn Corwin on 12/19/2021 13:57:18 [place]fort, Binnie Rail ([id number]) -------------------------------------------------------------------------------- Physician Orders Details Patient Name: Benson, [name] Date of Service: 12/19/2021 1:00 PM Medical Record Number: [id number] Patient Account Number: [id number] Date of Birth/Sex: [birth year] (66 y.o. F) Treating RN: Angelina Pih Primary Care Provider: [name] Other Clinician: Betha Loa Referring Provider: [name] Treating Provider/Extender: Tilda Franco in Treatment: 1 Verbal / Phone Orders: No Diagnosis Coding Follow-up Appointments Wound #1 Midline Chest o Return Appointment in 1 week. Bathing/ Shower/ Hygiene o May shower; gently cleanse wound with  antibacterial soap, rinse and pat dry prior to dressing wounds Medications-Please add to medication list. o Topical Antibiotic - Apply small amount of Medihoney to wound and cover with band-aid every day Wound Treatment Wound #1 - Chest Wound Laterality: Midline Cleanser: Soap and Water 1 x Per Day/15 Days Discharge Instructions: Gently cleanse wound with antibacterial soap, rinse and pat dry prior to dressing wounds Primary Dressing: Medihoney - Gel, 1.5 (oz), tube 1 x Per Day/15 Days Secondary  Dressing: Coverlet Latex-Free Fabric Adhesive Dressings 1 x Per Day/15 Days Discharge Instructions: 1.5 x 2 Secondary Dressing: Gauze 1 x Per [id number] Days Discharge Instructions: As directed: dry, moistened with saline or moistened with Dakins Solution Electronic Signature(s) Signed: 12/19/2021 2:03:38 PM By: Kalman Shan DO Signed: 12/19/2021 3:30:30 PM By: Massie Kluver Previous Signature: 12/19/2021 1:59:52 PM Version By: Kalman Shan DO Entered By: Massie Kluver on 12/19/2021 14:00:18 Schoffstall, [place]ind[place] B. ([id number]) -------------------------------------------------------------------------------- Problem List Details Patient Name: [name], [name] Date of Service: 12/19/2021 1:00 PM Medical Record Number: [id number] Patient Account Number: [id number] Date of Birth/Sex: [birth year] (66 y.o. F) Treating RN: Levora Dredge Primary Care Provider: Nobie Putnam Other Clinician: Massie Kluver Referring Provider: Nobie Putnam Treating Provider/Extender: Yaakov Guthrie in Treatment: 1 Active Problems ICD-10 Encounter Code Description Active Date MDM Diagnosis S21.109A Unspecified open wound of unspecified front wall of thorax without 12/12/2021 No Yes penetration into thoracic cavity, initial encounter [id number] Other follicular cysts of the skin and subcutaneous tissue 12/12/2021 No Yes G35 Multiple sclerosis 12/12/2021 No Yes Inactive Problems Resolved  Problems Electronic Signature(s) Signed: 12/19/2021 1:59:52 PM By: Kalman Shan DO Entered By: Kalman Shan on 12/19/2021 13:55:32 [place]ock, [place] B. ([id number]) -------------------------------------------------------------------------------- Progress Note Details Patient Name: [name], [name] B. Date of Service: 12/19/2021 1:00 PM Medical Record Number: [id number] Patient Account Number: [id number] Date of Birth/Sex: 08/29/1955 (66 y.o. F) Treating RN: Levora Dredge Primary Care Provider: Nobie Putnam Other Clinician: Massie Kluver Referring Provider: Nobie Putnam Treating Provider/Extender: Yaakov Guthrie in Treatment: 1 Subjective Chief Complaint Information obtained from Patient 12/12/2021; chest wound History of Present Illness (HPI) Admission 12/12/2021 [name] is a 66 year old female with a past medical history of multiple sclerosis that presents the clinic for a wound to her chest. She states she had a closed raised area to the site for several years. She states that about 2 months ago this open and ruptured. She states she had a lot of thick drainage. She has been on doxycycline For this issue 1 month ago. She states it still drains serous fluid. She currently denies signs of infection. 6/28; patient presents for follow-up. We started iodoform packing at last clinic visit. She states that is difficult to pack and has not been able to do this for the past few days. She currently denies signs of infection. She states she has her ultrasound scheduled for tomorrow. Objective Constitutional Vitals Time Taken: 1:14 PM, Height: 65 in, Weight: 141 lbs, BMI: 23.5, Temperature: 98.2 F, Pulse: 73 bpm, Respiratory Rate: 18 breaths/min, Blood Pressure: 123/80 mmHg. General Notes: Chest wall: To the mid xiphoid region next to the left breath there is an open wound with undermining from the 10 to 1 o'clock position. Granulation tissue at the  opening. No drainage noted. No necrotic debris present. Induration has improved. No signs of surrounding infection. Integumentary (Hair, Skin) Wound #1 status is Open. Original cause of wound was Gradually Appeared. The date acquired was: 10/12/2021. The wound has been in treatment 1 weeks. The wound is located on the Midline Chest. The wound measures 0.3cm length x 0.2cm width x 0.2cm depth; 0.047cm^2 area and 0.009cm^3 volume. There is Fat Layer (Subcutaneous Tissue) exposed. There is no tunneling noted, however, there is undermining starting at 9:00 and ending at 4:00 with a maximum distance of 0.4cm. There is a medium amount of serous drainage noted. The wound margin is flat and intact. There is medium (34-66%) pale granulation within the wound bed. There is a medium (34-66%) amount of  necrotic tissue within the wound bed including Adherent Slough. Assessment Active Problems ICD-10 Unspecified open wound of unspecified front wall of thorax without penetration into thoracic cavity, initial encounter Other follicular cysts of the skin and subcutaneous tissue Multiple sclerosis Patient's wound has shown improvement in size and appearance since last clinic visit. No signs of surrounding infection. Since she is unable to pack the wound effectively I recommended using Medihoney. Overall wound appears healing. She has her ultrasound tomorrow. Follow-up in 1 week. Olivier, Lenee B. ([id number]) Plan Follow-up Appointments: Wound #1 Midline Chest: Return Appointment in 1 week. Bathing/ Shower/ Hygiene: May shower; gently cleanse wound with antibacterial soap, rinse and pat dry prior to dressing wounds Medications-Please add to medication list.: Topical Antibiotic - Apply small amount of Medihoney to wound and cover with band-aid every day WOUND #1: - Chest Wound Laterality: Midline Cleanser: Soap and Water 1 x Per Day/15 Days Discharge Instructions: Gently cleanse wound with antibacterial soap,  rinse and pat dry prior to dressing wounds Primary Dressing: Medihoney - Gel, 1.5 (oz), tube 1 x Per Day/15 Days Secondary Dressing: Coverlet Latex-Free Fabric Adhesive Dressings 1 x Per Day/15 Days Discharge Instructions: 1.5 x 2 Secondary Dressing: Gauze 1 x Per Day/15 Days Discharge Instructions: As directed: dry, moistened with saline or moistened with Dakins Solution 1. Medihoney 2. Follow-up in 1 week Electronic Signature(s) Signed: 12/19/2021 1:59:52 PM By: Geralyn Corwin DO Entered By: Geralyn Corwin on 12/19/2021 13:58:16 Desilva, Binnie Rail ([id number]) -------------------------------------------------------------------------------- ROS/PFSH Details Patient Name: Chiappetta, [name] Date of Service: 12/19/2021 1:00 PM Medical Record Number: [id number] Patient Account Number: [id number] Date of Birth/Sex: 21-Jan-1956 (66 y.o. F) Treating RN: Angelina Pih Primary Care Provider: [name] Other Clinician: Betha Loa Referring Provider: [name] Treating Provider/Extender: Tilda Franco in Treatment: 1 Respiratory Medical History: Past Medical History Notes: Emphysema Cardiovascular Medical History: Positive for: Hypertension Endocrine Medical History: Negative for: Type II Diabetes Neurologic Medical History: Past Medical History Notes: MS Immunizations Pneumococcal Vaccine: Received Pneumococcal Vaccination: No Implantable Devices No devices added Family and Social History Current every day smoker; Marital Status - Widowed; Alcohol Use: Never; Drug Use: No History Electronic Signature(s) Signed: 12/19/2021 1:59:52 PM By: Geralyn Corwin DO Signed: 12/19/2021 4:35:40 PM By: Angelina Pih Entered By: Geralyn Corwin on 12/19/2021 13:59:18 Lorensen, Almeda B. ([id number]) -------------------------------------------------------------------------------- SuperBill Details Patient Name: Herrington, Lyn B. Date of Service:  12/19/2021 Medical Record Number: [id number] Patient Account Number: [id number] Date of Birth/Sex: 1956/03/11 (66 y.o. F) Treating RN: Angelina Pih Primary Care Provider: [name] Other Clinician: Betha Loa Referring Provider: [name] Treating Provider/Extender: Tilda Franco in Treatment: 1 Diagnosis Coding ICD-10 Codes Code Description S21.109A Unspecified open wound of unspecified front wall of thorax without penetration into thoracic cavity, initial encounter L72.8 Other follicular cysts of the skin and subcutaneous tissue G35 Multiple sclerosis Facility Procedures CPT4 Code: 16606301 Description: 99213 - WOUND CARE VISIT-LEV 3 EST PT Modifier: Quantity: 1 Physician Procedures CPT4: Description Modifier Quantity Code 6010932 99213 - WC PHYS LEVEL 3 - EST PT 1 CPT4: ICD-10 Diagnosis Description S21.109A Unspecified open wound of unspecified front wall of thorax without penetration into thoracic cavity, initial encounter L72.8 Other follicular cysts of the skin and subcutaneous tissue G35 Multiple sclerosis Electronic Signature(s) Signed: 12/19/2021 3:30:30 PM By: Betha Loa Signed: 12/19/2021 3:37:16 PM By: Geralyn Corwin DO Previous Signature: 12/19/2021 1:59:52 PM Version By: Geralyn Corwin DO Entered By: Betha Loa on 12/19/2021 15:29:22

## 2021-12-24 ENCOUNTER — Ambulatory Visit: Payer: 59 | Admitting: Physical Therapy

## 2021-12-26 ENCOUNTER — Encounter: Payer: Medicare Other | Attending: Internal Medicine | Admitting: Internal Medicine

## 2021-12-26 DIAGNOSIS — G35 Multiple sclerosis: Secondary | ICD-10-CM | POA: Insufficient documentation

## 2021-12-26 DIAGNOSIS — S21109A Unspecified open wound of unspecified front wall of thorax without penetration into thoracic cavity, initial encounter: Secondary | ICD-10-CM | POA: Diagnosis not present

## 2021-12-26 DIAGNOSIS — X58XXXA Exposure to other specified factors, initial encounter: Secondary | ICD-10-CM | POA: Insufficient documentation

## 2021-12-26 DIAGNOSIS — L728 Other follicular cysts of the skin and subcutaneous tissue: Secondary | ICD-10-CM | POA: Insufficient documentation

## 2021-12-26 NOTE — Progress Notes (Signed)
[name] ([id number]) Visit Report for 12/26/2021 Chief Complaint Document Details Patient Name: [name], [name] Date of Service: 12/26/2021 1:00 PM Medical Record Number: [id number] Patient Account Number: [id number] Date of Birth/Sex: [birth year] (66 y.o. F) Treating RN: Angelina Pih Primary Care Provider: [name] Other Clinician: Betha Loa Referring Provider: [name] Treating Provider/Extender: Tilda Franco in Treatment: 2 Information Obtained from: Patient Chief Complaint 12/12/2021; chest wound Electronic Signature(s) Signed: 12/26/2021 2:29:31 PM By: Geralyn Corwin DO Entered By: Geralyn Corwin on 12/26/2021 13:19:09 Longley, Binnie Rail ([id number]) -------------------------------------------------------------------------------- HPI Details Patient Name: Sporer, [name] Date of Service: 12/26/2021 1:00 PM Medical Record Number: [id number] Patient Account Number: [id number] Date of Birth/Sex: 1955/09/13 (66 y.o. F) Treating RN: Angelina Pih Primary Care Provider: [name] Other Clinician: Betha Loa Referring Provider: [name] Treating Provider/Extender: Tilda Franco in Treatment: 2 History of Present Illness HPI Description: Admission 12/12/2021 Ms. Emree Locicero is a 66 year old female with a past medical history of multiple sclerosis that presents the clinic for a wound to her chest. She states she had a closed raised area to the site for several years. She states that about 2 months ago this open and ruptured. She states she had a lot of thick drainage. She has been on doxycycline For this issue 1 month ago. She states it still drains serous fluid. She currently denies signs of infection. 6/28; patient presents for follow-up. We started iodoform packing at last clinic visit. She states that is difficult to pack and has not been able to do this for the past few days. She currently  denies signs of infection. She states she has her ultrasound scheduled for tomorrow. 7/5; patient presents for follow-up. She has been using Medihoney daily. Apparently her ultrasound got canceled and rescheduled for tomorrow. Unclear why. Patient currently has no issues or complaints today. She denies signs of infection. Electronic Signature(s) Signed: 12/26/2021 2:29:31 PM By: Geralyn Corwin DO Entered By: Geralyn Corwin on 12/26/2021 13:19:34 [place]ley, Binnie Rail ([id number]) -------------------------------------------------------------------------------- Physical Exam Details Patient Name: Chui, [name] Date of Service: 12/26/2021 1:00 PM Medical Record Number: [id number] Patient Account Number: [id number] Date of Birth/Sex: [birth year] (66 y.o. F) Treating RN: Angelina Pih Primary Care Provider: [name] Other Clinician: Betha Loa Referring Provider: [name] Treating Provider/Extender: Tilda Franco in Treatment: 2 Constitutional . Psychiatric . Notes Chest wall: To the mid xiphoid region next to the left breath there is an open wound with undermining from the 10 to 1 o'clock position. Granulation tissue at the opening. No signs of surrounding infection. Electronic Signature(s) Signed: 12/26/2021 2:29:31 PM By: Geralyn Corwin DO Entered By: Geralyn Corwin on 12/26/2021 13:20:12 Mones, Binnie Rail ([id number]) -------------------------------------------------------------------------------- Physician Orders Details Patient Name: Krumholz, [name] Date of Service: 12/26/2021 1:00 PM Medical Record Number: [id number] Patient Account Number: [id number] Date of Birth/Sex: [birth year] (66 y.o. F) Treating RN: Angelina Pih Primary Care Provider: [name] Other Clinician: Betha Loa Referring Provider: [name] Treating Provider/Extender: Tilda Franco in Treatment: 2 Verbal / Phone Orders:  No Diagnosis Coding Follow-up Appointments Wound #1 Midline Chest o Return Appointment in 1 week. Bathing/ Shower/ Hygiene o May shower; gently cleanse wound with antibacterial soap, rinse and pat dry prior to dressing wounds Medications-Please add to medication list. o Topical Antibiotic - Apply small amount of Medihoney to wound and cover with band-aid every day Wound Treatment Wound #1 - Chest Wound Laterality: Midline Cleanser: Soap and Water 1 x Per Day/15 Days Discharge Instructions: Gently cleanse wound  with antibacterial soap, rinse and pat dry prior to dressing wounds Primary Dressing: Medihoney - Gel, 1.5 (oz), tube 1 x Per Day/15 Days Secondary Dressing: Coverlet Latex-Free Fabric Adhesive Dressings 1 x Per Day/15 Days Discharge Instructions: 1.5 x 2 Secondary Dressing: Gauze 1 x Per Day/15 Days Discharge Instructions: As directed: dry, moistened with saline or moistened with Dakins Solution Electronic Signature(s) Signed: 12/26/2021 2:29:31 PM By: Kalman Shan DO Entered By: Kalman Shan on 12/26/2021 13:21:09 Tyrell, [place]. ([id number]) -------------------------------------------------------------------------------- Problem List Details Patient Name: Griffeth, [name] Date of Service: 12/26/2021 1:00 PM Medical Record Number: [id number] Patient Account Number: [id number] Date of Birth/Sex: [birth year] (66 y.o. F) Treating RN: Levora Dredge Primary Care Provider: Nobie Putnam Other Clinician: Massie Kluver Referring Provider: Nobie Putnam Treating Provider/Extender: Yaakov Guthrie in Treatment: 2 Active Problems ICD-10 Encounter Code Description Active Date MDM Diagnosis S21.109A Unspecified open wound of unspecified front wall of thorax without 12/12/2021 No Yes penetration into thoracic cavity, initial encounter 123XX123 Other follicular cysts of the skin and subcutaneous tissue 12/12/2021 No Yes G35 Multiple sclerosis 12/12/2021  No Yes Inactive Problems Resolved Problems Electronic Signature(s) Signed: 12/26/2021 2:29:31 PM By: Kalman Shan DO Entered By: Kalman Shan on 12/26/2021 13:17:27 [place], [place] ([id number]) -------------------------------------------------------------------------------- Progress Note Details Patient Name: Durden, [name] Date of Service: 12/26/2021 1:00 PM Medical Record Number: [id number] Patient Account Number: [id number] Date of Birth/Sex: [birth year] (65 y.o. F) Treating RN: Levora Dredge Primary Care Provider: Nobie Putnam Other Clinician: Massie Kluver Referring Provider: Nobie Putnam Treating Provider/Extender: Yaakov Guthrie in Treatment: 2 Subjective Chief Complaint Information obtained from Patient 12/12/2021; chest wound History of Present Illness (HPI) Admission 12/12/2021 Ms. Doni Sunderland is a 66 year old female with a past medical history of multiple sclerosis that presents the clinic for a wound to her chest. She states she had a closed raised area to the site for several years. She states that about 2 months ago this open and ruptured. She states she had a lot of thick drainage. She has been on doxycycline For this issue 1 month ago. She states it still drains serous fluid. She currently denies signs of infection. 6/28; patient presents for follow-up. We started iodoform packing at last clinic visit. She states that is difficult to pack and has not been able to do this for the past few days. She currently denies signs of infection. She states she has her ultrasound scheduled for tomorrow. 7/5; patient presents for follow-up. She has been using Medihoney daily. Apparently her ultrasound got canceled and rescheduled for tomorrow. Unclear why. Patient currently has no issues or complaints today. She denies signs of infection. Objective Constitutional Vitals Time Taken: 1:01 PM, Height: 65 in, Weight: 141 lbs, BMI: 23.5, Temperature:  98.5 F, Pulse: 80 bpm, Respiratory Rate: 18 breaths/min, Blood Pressure: 114/68 mmHg. General Notes: Chest wall: To the mid xiphoid region next to the left breath there is an open wound with undermining from the 10 to 1 o'clock position. Granulation tissue at the opening. No signs of surrounding infection. Integumentary (Hair, Skin) Wound #1 status is Open. Original cause of wound was Gradually Appeared. The date acquired was: 10/12/2021. The wound has been in treatment 2 weeks. The wound is located on the Midline Chest. The wound measures 0.2cm length x 0.1cm width x 0.2cm depth; 0.016cm^2 area and 0.003cm^3 volume. There is Fat Layer (Subcutaneous Tissue) exposed. There is a medium amount of serous drainage noted. The wound margin is flat and intact. There is medium (34-66%) pale granulation  within the wound bed. There is no necrotic tissue within the wound bed. Assessment Active Problems ICD-10 Unspecified open wound of unspecified front wall of thorax without penetration into thoracic cavity, initial encounter Other follicular cysts of the skin and subcutaneous tissue Multiple sclerosis Patient's wound has shown improvement in size and appearance since last clinic visit. Her ultrasound is scheduled for tomorrow. I recommended continuing Medihoney. Follow-up in 1 week. Moncrieffe, Sharetha B. ([id number]) Plan Follow-up Appointments: Wound #1 Midline Chest: Return Appointment in 1 week. Bathing/ Shower/ Hygiene: May shower; gently cleanse wound with antibacterial soap, rinse and pat dry prior to dressing wounds Medications-Please add to medication list.: Topical Antibiotic - Apply small amount of Medihoney to wound and cover with band-aid every day WOUND #1: - Chest Wound Laterality: Midline Cleanser: Soap and Water 1 x Per Day/15 Days Discharge Instructions: Gently cleanse wound with antibacterial soap, rinse and pat dry prior to dressing wounds Primary Dressing: Medihoney - Gel, 1.5 (oz),  tube 1 x Per Day/15 Days Secondary Dressing: Coverlet Latex-Free Fabric Adhesive Dressings 1 x Per Day/15 Days Discharge Instructions: 1.5 x 2 Secondary Dressing: Gauze 1 x Per Day/15 Days Discharge Instructions: As directed: dry, moistened with saline or moistened with Dakins Solution 1. Medihoney 2. Follow-up in 1 week Electronic Signature(s) Signed: 12/26/2021 2:29:31 PM By: Geralyn Corwin DO Entered By: Geralyn Corwin on 12/26/2021 13:20:43 Rampy, Binnie Rail ([id number]) -------------------------------------------------------------------------------- SuperBill Details Patient Name: Schepp, Eternity B. Date of Service: 12/26/2021 Medical Record Number: [id number] Patient Account Number: [id number] Date of Birth/Sex: 1956-01-25 (66 y.o. F) Treating RN: Angelina Pih Primary Care Provider: [name] Other Clinician: Betha Loa Referring Provider: [name] Treating Provider/Extender: Tilda Franco in Treatment: 2 Diagnosis Coding ICD-10 Codes Code Description S21.109A Unspecified open wound of unspecified front wall of thorax without penetration into thoracic cavity, initial encounter L72.8 Other follicular cysts of the skin and subcutaneous tissue G35 Multiple sclerosis Facility Procedures CPT4 Code: [id number] Description: 99213 - WOUND CARE VISIT-LEV 3 EST PT Modifier: Quantity: 1 Physician Procedures CPT4: Description Modifier Quantity Code [id number] 99213 - WC PHYS LEVEL 3 - EST PT 1 CPT4: ICD-10 Diagnosis Description S21.109A Unspecified open wound of unspecified front wall of thorax without penetration into thoracic cavity, initial encounter L72.8 Other follicular cysts of the skin and subcutaneous tissue G35 Multiple sclerosis Electronic Signature(s) Signed: 12/26/2021 2:29:31 PM By: Geralyn Corwin DO Entered By: Geralyn Corwin on 12/26/2021 13:21:00

## 2021-12-27 ENCOUNTER — Ambulatory Visit: Payer: Medicare Other

## 2021-12-27 ENCOUNTER — Ambulatory Visit: Payer: 59 | Admitting: Physical Therapy

## 2021-12-27 NOTE — Progress Notes (Signed)
[name], GODSIL ([id number]) Visit Report for 12/26/2021 Arrival Information Details Patient Name: [name], [name] Date of Service: 12/26/2021 1:00 PM Medical Record Number: [id number] Patient Account Number: [id number] Date of Birth/Sex: 06/20/1956 (66 y.o. F) Treating RN: Angelina Pih Primary Care Raissa Dam: [name] Other Clinician: Betha Loa Referring Mariko Nowakowski: [name] Treating Nayra Coury/Extender: Tilda Franco in Treatment: 2 Visit Information History Since Last Visit All ordered tests and consults were completed: No Patient Arrived: Dan Humphreys Added or deleted any medications: No Arrival Time: 13:00 Any new allergies or adverse reactions: No Transfer Assistance: None Had a fall or experienced change in No Patient Has Alerts: Yes activities of daily living that may affect Patient Alerts: Patient on Blood Thinner risk of falls: Not Diabetic Hospitalized since last visit: No Pain Present Now: No Electronic Signature(s) Signed: 12/27/2021 4:13:59 PM By: Betha Loa Entered By: Betha Loa on 12/26/2021 13:01:08 Martone, Ladaysha B. ([id number]) -------------------------------------------------------------------------------- Clinic Level of Care Assessment Details Patient Name: [name], [name] Date of Service: 12/26/2021 1:00 PM Medical Record Number: [id number] Patient Account Number: [id number] Date of Birth/Sex: [birth year] (66 y.o. F) Treating RN: Angelina Pih Primary Care Ramaya Guile: [name] Other Clinician: Betha Loa Referring Iram Astorino: [name] Treating Kimbrely Buckel/Extender: Tilda Franco in Treatment: 2 Clinic Level of Care Assessment Items TOOL 4 Quantity Score []  - Use when only an EandM is performed on FOLLOW-UP visit 0 ASSESSMENTS - Nursing Assessment / Reassessment X - Reassessment of Co-morbidities (includes updates in patient status) 1 10 X- 1 5 Reassessment of Adherence to  Treatment Plan ASSESSMENTS - Wound and Skin Assessment / Reassessment X - Simple Wound Assessment / Reassessment - one wound 1 5 []  - 0 Complex Wound Assessment / Reassessment - multiple wounds []  - 0 Dermatologic / Skin Assessment (not related to wound area) ASSESSMENTS - Focused Assessment []  - Circumferential Edema Measurements - multi extremities 0 []  - 0 Nutritional Assessment / Counseling / Intervention []  - 0 Lower Extremity Assessment (monofilament, tuning fork, pulses) []  - 0 Peripheral Arterial Disease Assessment (using hand held doppler) ASSESSMENTS - Ostomy and/or Continence Assessment and Care []  - Incontinence Assessment and Management 0 []  - 0 Ostomy Care Assessment and Management (repouching, etc.) PROCESS - Coordination of Care X - Simple Patient / Family Education for ongoing care 1 15 []  - 0 Complex (extensive) Patient / Family Education for ongoing care X- 1 10 Staff obtains Consents, Records, Test Results / Process Orders []  - 0 Staff telephones HHA, Nursing Homes / Clarify orders / etc []  - 0 Routine Transfer to another Facility (non-emergent condition) []  - 0 Routine Hospital Admission (non-emergent condition) []  - 0 New Admissions / / Ordering NPWT, Apligraf, etc. []  - 0 Emergency Hospital Admission (emergent condition) X- 1 10 Simple Discharge Coordination []  - 0 Complex (extensive) Discharge Coordination PROCESS - Special Needs []  - Pediatric / Minor Patient Management 0 []  - 0 Isolation Patient Management []  - 0 Hearing / Language / Visual special needs []  - 0 Assessment of Community assistance (transportation, D/C planning, etc.) []  - 0 Additional assistance / Altered mentation []  - 0 Support Surface(s) Assessment (bed, cushion, seat, etc.) INTERVENTIONS - Wound Cleansing / Measurement Eicher, Valmai B. ( ) X- 1 5 Simple Wound Cleansing - one wound []  - 0 Complex Wound Cleansing - multiple wounds X- 1  5 Wound Imaging (photographs - any number of wounds) []  - 0 Wound Tracing (instead of photographs) X- 1 5 Simple Wound Measurement - one wound []  - 0  Complex Wound Measurement - multiple wounds INTERVENTIONS - Wound Dressings X - Small Wound Dressing one or multiple wounds 1 10 []  - 0 Medium Wound Dressing one or multiple wounds []  - 0 Large Wound Dressing one or multiple wounds X- 1 5 Application of Medications - topical []  - 0 Application of Medications - injection INTERVENTIONS - Miscellaneous []  - External ear exam 0 []  - 0 Specimen Collection (cultures, biopsies, blood, body fluids, etc.) []  - 0 Specimen(s) / Culture(s) sent or taken to Lab for analysis []  - 0 Patient Transfer (multiple staff / / Similar devices) []  - 0 Simple Staple / Suture removal (25 or less) []  - 0 Complex Staple / Suture removal (26 or more) []  - 0 Hypo / Hyperglycemic Management (close monitor of Blood Glucose) []  - 0 Ankle / Brachial Index (ABI) - do not check if billed separately X- 1 5 Vital Signs Has the patient been seen at the hospital within the last three years: Yes Total Score: 90 Level Of Care: New/Established - Level 3 Electronic Signature(s) Signed: 12/27/2021 4:13:59 PM By: Entered By: on 12/26/2021 13:12:20 Eskridge, Mirian B. ( ) -------------------------------------------------------------------------------- Encounter Discharge Information Details Patient Name: [name], [name] Date of Service: 12/26/2021 1:00 PM Medical Record Number: Nurse, adult Patient Account Number: Date of Birth/Sex: Apr 01, 1956 (66 y.o. F) Treating RN: Primary Care Avyaan Summer: 02/27/2022 Other Clinician: Betha Loa Referring Hinton Luellen: Betha Loa Treating Norena Bratton/Extender: 02/26/2022 in Treatment: 2 Encounter Discharge Information Items Discharge Condition: Stable Ambulatory Status:  Walker Discharge Destination: Home Transportation: Other Accompanied By: self Schedule Follow-up Appointment: Yes Clinical Summary of Care: Electronic Signature(s) Signed: 12/27/2021 4:13:59 PM By: 02/26/2022 Entered By: [id number] on 12/26/2021 13:18:49 Baetz, Saralyn B. (04/02/1956) -------------------------------------------------------------------------------- Lower Extremity Assessment Details Patient Name: Fleagle, Saidi B. Date of Service: 12/26/2021 1:00 PM Medical Record Number: Angelina Pih Patient Account Number: [name] Date of Birth/Sex: 06-10-1956 (66 y.o. F) Treating RN: Tilda Franco Primary Care Camaryn Lumbert: 02/27/2022 Other Clinician: Betha Loa Referring Beecher Furio: Betha Loa Treating Jaleeyah Munce/Extender: 02/26/2022 in Treatment: 2 Electronic Signature(s) Signed: 12/26/2021 4:13:21 PM By: 02/26/2022 Signed: 12/27/2021 4:13:59 PM By: [id number] Entered By: 04/02/1956 on 12/26/2021 13:08:11 Kizer, Aren B. (Angelina Pih) -------------------------------------------------------------------------------- Multi Wound Chart Details Patient Name: Tolsma, Laree B. Date of Service: 12/26/2021 1:00 PM Medical Record Number: Betha Loa Patient Account Number: [name] Date of Birth/Sex: 1955-08-13 (66 y.o. F) Treating RN: Angelina Pih Primary Care Jeorgia Helming: 02/27/2022 Other Clinician: Betha Loa Referring Carleta Woodrow: Betha Loa Treating Gennie Eisinger/Extender: 02/26/2022 in Treatment: 2 Vital Signs Height(in): 65 Pulse(bpm): 80 Weight(lbs): 141 Blood Pressure(mmHg): 114/68 Body Mass Index(BMI): 23.5 Temperature(F): 98.5 Respiratory Rate(breaths/min): 18 Photos: [N/A:N/A] Wound Location: Midline Chest N/A N/A Wounding Event: Gradually Appeared N/A N/A Primary Etiology: Abscess N/A N/A Comorbid History: Hypertension N/A N/A Date Acquired: 10/12/2021 N/A N/A Weeks of Treatment: 2 N/A  N/A Wound Status: Open N/A N/A Wound Recurrence: No N/A N/A Measurements L x W x D (cm) 0.2x0.1x0.2 N/A N/A Area (cm) : 0.016 N/A N/A Volume (cm) : 0.003 N/A N/A % Reduction in Area: 86.40% N/A N/A % Reduction in Volume: 91.40% N/A N/A Classification: Full Thickness Without Exposed N/A N/A Support Structures Exudate Amount: Medium N/A N/A Exudate Type: Serous N/A N/A Exudate Color: amber N/A N/A Wound Margin: Flat and Intact N/A N/A Granulation Amount: Medium (34-66%) N/A N/A Granulation Quality: Pale N/A N/A Necrotic Amount: None Present (0%) N/A N/A Exposed Structures: Fat Layer (Subcutaneous Tissue):  N/A N/A Yes Fascia: No Tendon: No Muscle: No Joint: No Bone: No Epithelialization: Small (1-33%) N/A N/A Treatment Notes Electronic Signature(s) Signed: 12/27/2021 4:13:59 PM By: Betha Loa Entered By: Betha Loa on 12/26/2021 13:08:21 Carda, Binnie Rail ([id number]) -------------------------------------------------------------------------------- Multi-Disciplinary Care Plan Details Patient Name: Messinger, Hibba B. Date of Service: 12/26/2021 1:00 PM Medical Record Number: [id number] Patient Account Number: [id number] Date of Birth/Sex: [birth year] (66 y.o. F) Treating RN: Angelina Pih Primary Care Shayon Trompeter: [name] Other Clinician: Betha Loa Referring Makaylee Spielberg: [name] Treating Brendin Situ/Extender: Tilda Franco in Treatment: 2 Active Inactive Orientation to the Wound Care Program Nursing Diagnoses: Knowledge deficit related to the wound healing center program Goals: Patient/caregiver will verbalize understanding of the Wound Healing Center Program Date Initiated: 12/12/2021 Target Resolution Date: 12/12/2021 Goal Status: Active Interventions: Provide education on orientation to the wound center Notes: Soft Tissue Infection Nursing Diagnoses: Impaired tissue integrity Knowledge deficit related to disease process and  management Knowledge deficit related to home infection control: handwashing, handling of soiled dressings, supply storage Potential for infection: soft tissue Goals: Patient will remain free of wound infection Date Initiated: 12/12/2021 Target Resolution Date: 12/12/2021 Goal Status: Active Patient's soft tissue infection will resolve Date Initiated: 12/12/2021 Target Resolution Date: 12/12/2021 Goal Status: Active Signs and symptoms of infection will be recognized early to allow for prompt treatment Date Initiated: 12/12/2021 Target Resolution Date: 12/12/2021 Goal Status: Active Interventions: Assess signs and symptoms of infection every visit Provide education on infection Treatment Activities: Education provided on Infection : 12/12/2021 Notes: Wound/Skin Impairment Nursing Diagnoses: Impaired tissue integrity Knowledge deficit related to smoking impact on wound healing Knowledge deficit related to ulceration/compromised skin integrity Goals: Patient/caregiver will verbalize understanding of skin care regimen Date Initiated: 12/12/2021 Target Resolution Date: 12/12/2021 Goal Status: Active Guiffre, Cornell B. ([id number]) Ulcer/skin breakdown will have a volume reduction of 30% by week 4 Date Initiated: 12/12/2021 Target Resolution Date: 01/02/2022 Goal Status: Active Interventions: Assess patient/caregiver ability to obtain necessary supplies Assess patient/caregiver ability to perform ulcer/skin care regimen upon admission and as needed Assess ulceration(s) every visit Treatment Activities: Skin care regimen initiated : 12/12/2021 Topical wound management initiated : 12/12/2021 Notes: Electronic Signature(s) Signed: 12/26/2021 4:13:21 PM By: Angelina Pih Signed: 12/27/2021 4:13:59 PM By: Betha Loa Entered By: Betha Loa on 12/26/2021 13:08:15 Snider, Shakeia B. ([id number]) -------------------------------------------------------------------------------- Pain Assessment  Details Patient Name: Goldammer, [name] Date of Service: 12/26/2021 1:00 PM Medical Record Number: [id number] Patient Account Number: [id number] Date of Birth/Sex: [birth year] (66 y.o. F) Treating RN: Angelina Pih Primary Care Geena Weinhold: [name] Other Clinician: Betha Loa Referring Navi Ewton: [name] Treating Medora Roorda/Extender: Tilda Franco in Treatment: 2 Active Problems Location of Pain Severity and Description of Pain Patient Has Paino No Site Locations Pain Management and Medication Current Pain Management: Electronic Signature(s) Signed: 12/26/2021 4:13:21 PM By: Angelina Pih Signed: 12/27/2021 4:13:59 PM By: Betha Loa Entered By: Betha Loa on 12/26/2021 13:03:51 Stouffer, Binnie Rail ([id number]) -------------------------------------------------------------------------------- Patient/Caregiver Education Details Patient Name: Bethards, Esmeralda B. Date of Service: 12/26/2021 1:00 PM Medical Record Number: [id number] Patient Account Number: [id number] Date of Birth/Gender: 1955-09-20 (66 y.o. F) Treating RN: Angelina Pih Primary Care Physician: [name] Other Clinician: Betha Loa Referring Physician: [name] Treating Physician/Extender: Tilda Franco in Treatment: 2 Education Assessment Education Provided To: Patient Education Topics Provided Wound/Skin Impairment: Handouts: Other: continue wound care as directed Methods: Explain/Verbal Responses: State content correctly Electronic Signature(s) Signed: 12/27/2021 4:13:59 PM By: Betha Loa Entered By: Betha Loa on 12/26/2021 13:12:53 Pisani, [facility]  B. ([id number]) -------------------------------------------------------------------------------- Wound Assessment Details Patient Name: Kluger, Kemiyah B. Date of Service: 12/26/2021 1:00 PM Medical Record Number: [id number] Patient Account Number: [id number] Date of Birth/Sex: 14-Jan-1956  (66 y.o. F) Treating RN: Angelina Pih Primary Care Kaylene Dawn: [name] Other Clinician: Betha Loa Referring Advith Martine: [name] Treating Emani Taussig/Extender: Tilda Franco in Treatment: 2 Wound Status Wound Number: 1 Primary Etiology: Abscess Wound Location: Midline Chest Wound Status: Open Wounding Event: Gradually Appeared Comorbid History: Hypertension Date Acquired: 10/12/2021 Weeks Of Treatment: 2 Clustered Wound: No Photos Wound Measurements Length: (cm) 0.2 Width: (cm) 0.1 Depth: (cm) 0.2 Area: (cm) 0.016 Volume: (cm) 0.003 % Reduction in Area: 86.4% % Reduction in Volume: 91.4% Epithelialization: Small (1-33%) Wound Description Classification: Full Thickness Without Exposed Support Structu Wound Margin: Flat and Intact Exudate Amount: Medium Exudate Type: Serous Exudate Color: amber res Foul Odor After Cleansing: No Slough/Fibrino Yes Wound Bed Granulation Amount: Medium (34-66%) Exposed Structure Granulation Quality: Pale Fascia Exposed: No Necrotic Amount: None Present (0%) Fat Layer (Subcutaneous Tissue) Exposed: Yes Tendon Exposed: No Muscle Exposed: No Joint Exposed: No Bone Exposed: No Treatment Notes Wound #1 (Chest) Wound Laterality: Midline Cleanser Soap and Water Discharge Instruction: Gently cleanse wound with antibacterial soap, rinse and pat dry prior to dressing wounds Peri-Wound Care Eckerson, Naiomi B. ([id number]) Topical Primary Dressing Medihoney - Gel, 1.5 (oz), tube Secondary Dressing Coverlet Latex-Free Fabric Adhesive Dressings Discharge Instruction: 1.5 x 2 Gauze Discharge Instruction: As directed: dry, moistened with saline or moistened with Dakins Solution Secured With Compression Wrap Compression Stockings Add-Ons Electronic Signature(s) Signed: 12/26/2021 4:13:21 PM By: Angelina Pih Signed: 12/27/2021 4:13:59 PM By: Betha Loa Entered By: Betha Loa on 12/26/2021  13:07:59 Nell, Nesiah B. ([id number]) -------------------------------------------------------------------------------- Vitals Details Patient Name: Lampi, Bettejane B. Date of Service: 12/26/2021 1:00 PM Medical Record Number: [id number] Patient Account Number: [id number] Date of Birth/Sex: [birth year] (66 y.o. F) Treating RN: Angelina Pih Primary Care Tegan Burnside: [name] Other Clinician: Betha Loa Referring Pasqualino Witherspoon: [name] Treating Octavian Godek/Extender: Tilda Franco in Treatment: 2 Vital Signs Time Taken: 13:01 Temperature (F): 98.5 Height (in): 65 Pulse (bpm): 80 Weight (lbs): 141 Respiratory Rate (breaths/min): 18 Body Mass Index (BMI): 23.5 Blood Pressure (mmHg): 114/68 Reference Range: 80 - 120 mg / dl Electronic Signature(s) Signed: 12/27/2021 4:13:59 PM By: Betha Loa Entered By: Betha Loa on 12/26/2021 13:03:20

## 2021-12-28 ENCOUNTER — Other Ambulatory Visit: Payer: Self-pay | Admitting: Family Medicine

## 2021-12-28 DIAGNOSIS — J432 Centrilobular emphysema: Secondary | ICD-10-CM

## 2021-12-28 NOTE — Telephone Encounter (Signed)
Requested Prescriptions  Pending Prescriptions Disp Refills  . albuterol (VENTOLIN HFA) 108 (90 Base) MCG/ACT inhaler [Pharmacy Med Name: albuterol sulfate HFA 90 mcg/actuation aerosol inhaler] 8.5 g 2    Sig: INHALE TWO PUFFS BY MOUTH EVERY 6 HOURS AS NEEDED FOR WHEEZING AND SHORTNESS OF BREATH (BULK)     Pulmonology:  Beta Agonists 2 Passed - 12/28/2021  2:07 PM      Passed - Last BP in normal range    BP Readings from Last 1 Encounters:  11/20/21 124/62         Passed - Last Heart Rate in normal range    Pulse Readings from Last 1 Encounters:  11/20/21 76         Passed - Valid encounter within last 12 months    Recent Outpatient Visits          1 month ago Chronic skin ulcer with fat layer exposed [facility])   [facility] Smitty Cords, DO   7 months ago Multiple sclerosis, relapsing-remitting [facility])   [facility] Smitty Cords, DO   1 year ago Multiple sclerosis, relapsing-remitting [facility])   [facility] Smitty Cords, DO   1 year ago Multiple sclerosis, relapsing-remitting [facility])   [facility] Smitty Cords, DO   2 years ago Multiple sclerosis, relapsing-remitting [facility])   [facility] Oval, Netta Neat, DO

## 2021-12-31 ENCOUNTER — Ambulatory Visit: Payer: 59 | Admitting: Physical Therapy

## 2022-01-02 ENCOUNTER — Encounter (HOSPITAL_BASED_OUTPATIENT_CLINIC_OR_DEPARTMENT_OTHER): Payer: Medicare Other | Admitting: Internal Medicine

## 2022-01-02 ENCOUNTER — Ambulatory Visit: Payer: 59 | Admitting: Physical Therapy

## 2022-01-02 DIAGNOSIS — L728 Other follicular cysts of the skin and subcutaneous tissue: Secondary | ICD-10-CM

## 2022-01-02 DIAGNOSIS — G35 Multiple sclerosis: Secondary | ICD-10-CM

## 2022-01-02 DIAGNOSIS — S21109A Unspecified open wound of unspecified front wall of thorax without penetration into thoracic cavity, initial encounter: Secondary | ICD-10-CM

## 2022-01-02 NOTE — Progress Notes (Signed)
KYLINA, VULTAGGIO ([id number]) Visit Report for 01/02/2022 Arrival Information Details Patient Name: [name], [name] Date of Service: 01/02/2022 1:00 PM Medical Record Number: [id number] Patient Account Number: [id number] Date of Birth/Sex: [birth year] (66 y.o. F) Treating RN: Levora Dredge Primary Care Pratik Dalziel: Nobie Putnam Other Clinician: Referring Darell Saputo: Nobie Putnam Treating Nasiya Pascual/Extender: Yaakov Guthrie in Treatment: 3 Visit Information History Since Last Visit Added or deleted any medications: No Patient Arrived: Gilford Rile Any new allergies or adverse reactions: No Arrival Time: 12:59 Had a fall or experienced change in No Accompanied By: self activities of daily living that may affect Transfer Assistance: None risk of falls: Patient Identification Verified: Yes Hospitalized since last visit: No Secondary Verification Process Completed: Yes Has Dressing in Place as Prescribed: Yes Patient Has Alerts: Yes Pain Present Now: Yes Patient Alerts: Patient on Blood Thinner Not Diabetic Electronic Signature(s) Signed: 01/02/2022 3:48:03 PM By: Levora Dredge Entered By: Levora Dredge on 01/02/2022 12:59:42 Mahar, Dalbert Batman ([id number]) -------------------------------------------------------------------------------- Clinic Level of Care Assessment Details Patient Name: [name], [name] Date of Service: 01/02/2022 1:00 PM Medical Record Number: [id number] Patient Account Number: [id number] Date of Birth/Sex: 27-Sep-1955 (66 y.o. F) Treating RN: Levora Dredge Primary Care Rickie Gutierres: Nobie Putnam Other Clinician: Referring Latecia Miler: Nobie Putnam Treating Wade Asebedo/Extender: Yaakov Guthrie in Treatment: 3 Clinic Level of Care Assessment Items TOOL 4 Quantity Score '[]'  - Use when only an EandM is performed on FOLLOW-UP visit 0 ASSESSMENTS - Nursing Assessment / Reassessment X - Reassessment of Co-morbidities (includes  updates in patient status) 1 10 X- 1 5 Reassessment of Adherence to Treatment Plan ASSESSMENTS - Wound and Skin Assessment / Reassessment X - Simple Wound Assessment / Reassessment - one wound 1 5 '[]'  - 0 Complex Wound Assessment / Reassessment - multiple wounds '[]'  - 0 Dermatologic / Skin Assessment (not related to wound area) ASSESSMENTS - Focused Assessment '[]'  - Circumferential Edema Measurements - multi extremities 0 '[]'  - 0 Nutritional Assessment / Counseling / Intervention '[]'  - 0 Lower Extremity Assessment (monofilament, tuning fork, pulses) '[]'  - 0 Peripheral Arterial Disease Assessment (using hand held doppler) ASSESSMENTS - Ostomy and/or Continence Assessment and Care '[]'  - Incontinence Assessment and Management 0 '[]'  - 0 Ostomy Care Assessment and Management (repouching, etc.) PROCESS - Coordination of Care X - Simple Patient / Family Education for ongoing care 1 15 '[]'  - 0 Complex (extensive) Patient / Family Education for ongoing care '[]'  - 0 Staff obtains Programmer, systems, Records, Test Results / Process Orders '[]'  - 0 Staff telephones HHA, Nursing Homes / Clarify orders / etc '[]'  - 0 Routine Transfer to another Facility (non-emergent condition) '[]'  - 0 Routine Hospital Admission (non-emergent condition) '[]'  - 0 New Admissions / Biomedical engineer / Ordering NPWT, Apligraf, etc. '[]'  - 0 Emergency Hospital Admission (emergent condition) X- 1 10 Simple Discharge Coordination '[]'  - 0 Complex (extensive) Discharge Coordination PROCESS - Special Needs '[]'  - Pediatric / Minor Patient Management 0 '[]'  - 0 Isolation Patient Management '[]'  - 0 Hearing / Language / Visual special needs '[]'  - 0 Assessment of Community assistance (transportation, D/C planning, etc.) '[]'  - 0 Additional assistance / Altered mentation '[]'  - 0 Support Surface(s) Assessment (bed, cushion, seat, etc.) INTERVENTIONS - Wound Cleansing / Measurement Hyppolite, Nashae B. ([id number]) X- 1 5 Simple Wound Cleansing  - one wound '[]'  - 0 Complex Wound Cleansing - multiple wounds X- 1 5 Wound Imaging (photographs - any number of wounds) '[]'  - 0 Wound Tracing (instead of photographs) X- 1 5 Simple Wound  Measurement - one wound '[]'  - 0 Complex Wound Measurement - multiple wounds INTERVENTIONS - Wound Dressings X - Small Wound Dressing one or multiple wounds 1 10 '[]'  - 0 Medium Wound Dressing one or multiple wounds '[]'  - 0 Large Wound Dressing one or multiple wounds '[]'  - 0 Application of Medications - topical '[]'  - 0 Application of Medications - injection INTERVENTIONS - Miscellaneous '[]'  - External ear exam 0 '[]'  - 0 Specimen Collection (cultures, biopsies, blood, body fluids, etc.) '[]'  - 0 Specimen(s) / Culture(s) sent or taken to Lab for analysis '[]'  - 0 Patient Transfer (multiple staff / Civil Service fast streamer / Similar devices) '[]'  - 0 Simple Staple / Suture removal (25 or less) '[]'  - 0 Complex Staple / Suture removal (26 or more) '[]'  - 0 Hypo / Hyperglycemic Management (close monitor of Blood Glucose) '[]'  - 0 Ankle / Brachial Index (ABI) - do not check if billed separately X- 1 5 Vital Signs Has the patient been seen at the hospital within the last three years: Yes Total Score: 75 Level Of Care: New/Established - Level 2 Electronic Signature(s) Signed: 01/02/2022 3:48:03 PM By: Levora Dredge Entered By: Levora Dredge on 01/02/2022 13:18:28 Pendelton, Dalbert Batman ([id number]) -------------------------------------------------------------------------------- Encounter Discharge Information Details Patient Name: [name], [name] Date of Service: 01/02/2022 1:00 PM Medical Record Number: [id number] Patient Account Number: [id number] Date of Birth/Sex: 1956/01/17 (66 y.o. F) Treating RN: Levora Dredge Primary Care Gisel Vipond: Nobie Putnam Other Clinician: Referring Deborrah Mabin: Nobie Putnam Treating Jurnie Garritano/Extender: Yaakov Guthrie in Treatment: 3 Encounter Discharge Information  Items Discharge Condition: Stable Ambulatory Status: Ambulatory Discharge Destination: Home Transportation: Other Accompanied By: self Schedule Follow-up Appointment: Yes Clinical Summary of Care: Electronic Signature(s) Signed: 01/02/2022 3:48:03 PM By: Levora Dredge Entered By: Levora Dredge on 01/02/2022 13:19:09 Mariscal, Dalbert Batman ([id number]) -------------------------------------------------------------------------------- Lower Extremity Assessment Details Patient Name: Harrel, Jacoba B. Date of Service: 01/02/2022 1:00 PM Medical Record Number: [id number] Patient Account Number: [id number] Date of Birth/Sex: 17-Oct-1955 (66 y.o. F) Treating RN: Levora Dredge Primary Care Merridy Pascoe: Nobie Putnam Other Clinician: Referring Odes Lolli: Nobie Putnam Treating Jimena Wieczorek/Extender: Yaakov Guthrie in Treatment: 3 Electronic Signature(s) Signed: 01/02/2022 3:48:03 PM By: Levora Dredge Entered By: Levora Dredge on 01/02/2022 13:05:09 Loux, Dalbert Batman ([id number]) -------------------------------------------------------------------------------- Multi Wound Chart Details Patient Name: Spindler, Haidyn B. Date of Service: 01/02/2022 1:00 PM Medical Record Number: [id number] Patient Account Number: [id number] Date of Birth/Sex: [birth year] (66 y.o. F) Treating RN: Levora Dredge Primary Care Shaquan Puerta: Nobie Putnam Other Clinician: Referring Ellenore Roscoe: Nobie Putnam Treating Myasia Sinatra/Extender: Yaakov Guthrie in Treatment: 3 Vital Signs Height(in): 65 Pulse(bpm): 71 Weight(lbs): 141 Blood Pressure(mmHg): 119/74 Body Mass Index(BMI): 23.5 Temperature(F): 98.3 Respiratory Rate(breaths/min): 18 Photos: [N/A:N/A] Wound Location: Midline Chest N/A N/A Wounding Event: Gradually Appeared N/A N/A Primary Etiology: Abscess N/A N/A Comorbid History: Hypertension N/A N/A Date Acquired: 10/12/2021 N/A N/A Weeks of Treatment: 3 N/A N/A Wound  Status: Open N/A N/A Wound Recurrence: No N/A N/A Measurements L x W x D (cm) 0.1x0.1x0.1 N/A N/A Area (cm) : 0.008 N/A N/A Volume (cm) : 0.001 N/A N/A % Reduction in Area: 93.20% N/A N/A % Reduction in Volume: 97.10% N/A N/A Classification: Full Thickness Without Exposed N/A N/A Support Structures Exudate Amount: Medium N/A N/A Exudate Type: Serosanguineous N/A N/A Exudate Color: red, brown N/A N/A Wound Margin: Flat and Intact N/A N/A Granulation Amount: None Present (0%) N/A N/A Necrotic Amount: None Present (0%) N/A N/A Exposed Structures: Fascia: No N/A N/A Fat Layer (Subcutaneous Tissue): No Tendon: No Muscle: No  Joint: No Bone: No Epithelialization: Small (1-33%) N/A N/A Treatment Notes Electronic Signature(s) Signed: 01/02/2022 3:48:03 PM By: Levora Dredge Entered By: Levora Dredge on 01/02/2022 13:06:54 Rounsaville, Dalbert Batman ([id number]) -------------------------------------------------------------------------------- [place] Details Patient Name: Kemp, Dorothymae B. Date of Service: 01/02/2022 1:00 PM Medical Record Number: [id number] Patient Account Number: [id number] Date of Birth/Sex: 1955/12/12 (66 y.o. F) Treating RN: Levora Dredge Primary Care Morenike Cuff: Nobie Putnam Other Clinician: Referring Atiyana Welte: Nobie Putnam Treating Daymion Nazaire/Extender: Yaakov Guthrie in Treatment: 3 Active Inactive Soft Tissue Infection Nursing Diagnoses: Impaired tissue integrity Knowledge deficit related to disease process and management Knowledge deficit related to home infection control: handwashing, handling of soiled dressings, supply storage Potential for infection: soft tissue Goals: Patient will remain free of wound infection Date Initiated: 12/12/2021 Target Resolution Date: 12/12/2021 Goal Status: Active Patient's soft tissue infection will resolve Date Initiated: 12/12/2021 Target Resolution Date: 12/12/2021 Goal Status:  Active Signs and symptoms of infection will be recognized early to allow for prompt treatment Date Initiated: 12/12/2021 Target Resolution Date: 12/12/2021 Goal Status: Active Interventions: Assess signs and symptoms of infection every visit Provide education on infection Treatment Activities: Education provided on Infection : 12/12/2021 Notes: Wound/Skin Impairment Nursing Diagnoses: Impaired tissue integrity Knowledge deficit related to smoking impact on wound healing Knowledge deficit related to ulceration/compromised skin integrity Goals: Patient/caregiver will verbalize understanding of skin care regimen Date Initiated: 12/12/2021 Date Inactivated: 01/02/2022 Target Resolution Date: 12/12/2021 Goal Status: Met Ulcer/skin breakdown will have a volume reduction of 30% by week 4 Date Initiated: 12/12/2021 Target Resolution Date: 01/02/2022 Goal Status: Active Interventions: Assess patient/caregiver ability to obtain necessary supplies Assess patient/caregiver ability to perform ulcer/skin care regimen upon admission and as needed Assess ulceration(s) every visit Treatment Activities: Skin care regimen initiated : 12/12/2021 Topical wound management initiated : 12/12/2021 Notes: LEOTHA, WESTERMEYER ([id number]) Electronic Signature(s) Signed: 01/02/2022 3:48:03 PM By: Levora Dredge Entered By: Levora Dredge on 01/02/2022 13:06:39 Kronberg, [place] B. ([id number]) -------------------------------------------------------------------------------- Pain Assessment Details Patient Name: Patnaude, [name] Date of Service: 01/02/2022 1:00 PM Medical Record Number: [id number] Patient Account Number: [id number] Date of Birth/Sex: 1956-01-10 (66 y.o. F) Treating RN: Levora Dredge Primary Care Freyja Govea: Nobie Putnam Other Clinician: Referring Cassell Voorhies: Nobie Putnam Treating Markita Stcharles/Extender: Yaakov Guthrie in Treatment: 3 Active Problems Location of Pain Severity and  Description of Pain Patient Has Paino Yes Site Locations Rate the pain. Current Pain Level: 7 Pain Management and Medication Current Pain Management: Notes pt states general pain Electronic Signature(s) Signed: 01/02/2022 3:48:03 PM By: Levora Dredge Entered By: Levora Dredge on 01/02/2022 13:02:04 Derhammer, Dalbert Batman ([id number]) -------------------------------------------------------------------------------- Patient/Caregiver Education Details Patient Name: Hendry, Cyndel B. Date of Service: 01/02/2022 1:00 PM Medical Record Number: [id number] Patient Account Number: [id number] Date of Birth/Gender: [birth year] (66 y.o. F) Treating RN: Levora Dredge Primary Care Physician: Nobie Putnam Other Clinician: Referring Physician: Nobie Putnam Treating Physician/Extender: Yaakov Guthrie in Treatment: 3 Education Assessment Education Provided To: Patient Education Topics Provided Wound/Skin Impairment: Handouts: Caring for Your Ulcer Methods: Explain/Verbal Responses: State content correctly Electronic Signature(s) Signed: 01/02/2022 3:48:03 PM By: Levora Dredge Entered By: Levora Dredge on 01/02/2022 13:18:44 Esker, Lynden B. ([id number]) -------------------------------------------------------------------------------- Wound Assessment Details Patient Name: Zarr, Tobey B. Date of Service: 01/02/2022 1:00 PM Medical Record Number: [id number] Patient Account Number: [id number] Date of Birth/Sex: 08-Sep-1955 (66 y.o. F) Treating RN: Levora Dredge Primary Care Mikela Senn: Nobie Putnam Other Clinician: Referring Kennon Encinas: Nobie Putnam Treating Keileigh Vahey/Extender: Yaakov Guthrie in Treatment: 3 Wound Status Wound Number: 1 Primary Etiology: Abscess Wound  Location: Midline Chest Wound Status: Open Wounding Event: Gradually Appeared Comorbid History: Hypertension Date Acquired: 10/12/2021 Weeks Of Treatment: 3 Clustered Wound:  No Photos Wound Measurements Length: (cm) 0.1 Width: (cm) 0.1 Depth: (cm) 0.1 Area: (cm) 0.008 Volume: (cm) 0.001 % Reduction in Area: 93.2% % Reduction in Volume: 97.1% Epithelialization: Small (1-33%) Tunneling: No Undermining: No Wound Description Classification: Full Thickness Without Exposed Support Structu Wound Margin: Flat and Intact Exudate Amount: Medium Exudate Type: Serosanguineous Exudate Color: red, brown res Foul Odor After Cleansing: No Slough/Fibrino Yes Wound Bed Granulation Amount: None Present (0%) Exposed Structure Necrotic Amount: None Present (0%) Fascia Exposed: No Fat Layer (Subcutaneous Tissue) Exposed: No Tendon Exposed: No Muscle Exposed: No Joint Exposed: No Bone Exposed: No Treatment Notes Wound #1 (Chest) Wound Laterality: Midline Cleanser Soap and Water Discharge Instruction: Gently cleanse wound with antibacterial soap, rinse and pat dry prior to dressing wounds Peri-Wound Care [place], [place] ([id number]) Topical Primary Dressing Medihoney - Gel, 1.5 (oz), tube Secondary Dressing Coverlet Latex-Free Fabric Adhesive Dressings Discharge Instruction: 1.5 x 2 Gauze Discharge Instruction: As directed: dry, moistened with saline or moistened with Dakins Solution Secured With Compression Wrap Compression Stockings Add-Ons Electronic Signature(s) Signed: 01/02/2022 3:48:03 PM By: Levora Dredge Entered By: Levora Dredge on 01/02/2022 13:05:01 Rumery, Dalbert Batman ([id number]) -------------------------------------------------------------------------------- Vitals Details Patient Name: Roque Cash B. Date of Service: 01/02/2022 1:00 PM Medical Record Number: [id number] Patient Account Number: [id number] Date of Birth/Sex: 08/27/1955 (66 y.o. F) Treating RN: Levora Dredge Primary Care Marbeth Smedley: Nobie Putnam Other Clinician: Referring Dnasia Gauna: Nobie Putnam Treating Danelia Snodgrass/Extender: Yaakov Guthrie in  Treatment: 3 Vital Signs Time Taken: 12:59 Temperature (F): 98.3 Height (in): 65 Pulse (bpm): 71 Weight (lbs): 141 Respiratory Rate (breaths/min): 18 Body Mass Index (BMI): 23.5 Blood Pressure (mmHg): 119/74 Reference Range: 80 - 120 mg / dl Electronic Signature(s) Signed: 01/02/2022 3:48:03 PM By: Levora Dredge Entered By: Levora Dredge on 01/02/2022 13:01:45

## 2022-01-02 NOTE — Progress Notes (Signed)
[name] ([id number]) Visit Report for 01/02/2022 Chief Complaint Document Details Patient Name: [name], [name] Date of Service: 01/02/2022 1:00 PM Medical Record Number: [id number] Patient Account Number: [id number] Date of Birth/Sex: Oct 08, 1955 (66 y.o. F) Treating RN: Angelina Pih Primary Care Provider: [name] Other Clinician: Referring Provider: [name] Treating Provider/Extender: Tilda Franco in Treatment: 3 Information Obtained from: Patient Chief Complaint 12/12/2021; chest wound Electronic Signature(s) Signed: 01/02/2022 1:48:26 PM By: Geralyn Corwin DO Entered By: Geralyn Corwin on 01/02/2022 13:12:01 [name], [name] ([id number]) -------------------------------------------------------------------------------- HPI Details Patient Name: [name], [name] Date of Service: 01/02/2022 1:00 PM Medical Record Number: [id number] Patient Account Number: [id number] Date of Birth/Sex: Aug 06, 1955 (66 y.o. F) Treating RN: Angelina Pih Primary Care Provider: [name] Other Clinician: Referring Provider: [name] Treating Provider/Extender: Tilda Franco in Treatment: 3 History of Present Illness HPI Description: Admission 12/12/2021 [name] is a 66 year old female with a past medical history of multiple sclerosis that presents the clinic for a wound to her chest. She states she had a closed raised area to the site for several years. She states that about 2 months ago this open and ruptured. She states she had a lot of thick drainage. She has been on doxycycline For this issue 1 month ago. She states it still drains serous fluid. She currently denies signs of infection. 6/28; patient presents for follow-up. We started iodoform packing at last clinic visit. She states that is difficult to pack and has not been able to do this for the past few days. She currently denies signs of infection. She  states she has her ultrasound scheduled for tomorrow. 7/5; patient presents for follow-up. She has been using Medihoney daily. Apparently her ultrasound got canceled and rescheduled for tomorrow. Unclear why. Patient currently has no issues or complaints today. She denies signs of infection. 7/12; patient presents for follow-up. She continues to use Medihoney. She has not obtained her ultrasound. She reports improvement in wound healing. Electronic Signature(s) Signed: 01/02/2022 1:48:26 PM By: Geralyn Corwin DO Entered By: Geralyn Corwin on 01/02/2022 13:13:00 [name], [name] ([id number]) -------------------------------------------------------------------------------- Physical Exam Details Patient Name: [name], [name] Date of Service: 01/02/2022 1:00 PM Medical Record Number: [id number] Patient Account Number: [id number] Date of Birth/Sex: [birth year] (66 y.o. F) Treating RN: Angelina Pih Primary Care Provider: [name] Other Clinician: Referring Provider: [name] Treating Provider/Extender: Tilda Franco in Treatment: 3 Constitutional . Psychiatric . Notes Chest wall: To the mid xiphoid region next to the left breath there is an open wound with undermining from the 10 to 1 o'clock position. Granulation tissue at the opening. No signs of surrounding infection. Electronic Signature(s) Signed: 01/02/2022 1:48:26 PM By: Geralyn Corwin DO Entered By: Geralyn Corwin on 01/02/2022 13:13:35 [name] ([id number]) -------------------------------------------------------------------------------- Physician Orders Details Patient Name: [name], [name] Date of Service: 01/02/2022 1:00 PM Medical Record Number: [id number] Patient Account Number: [id number] Date of Birth/Sex: [birth year] (66 y.o. F) Treating RN: Angelina Pih Primary Care Provider: [name] Other Clinician: Referring Provider: [name] Treating  Provider/Extender: Tilda Franco in Treatment: 3 Verbal / Phone Orders: No Diagnosis Coding Follow-up Appointments Wound #1 Midline Chest o Return Appointment in 1 week. o Nurse Visit as needed Bathing/ Shower/ Hygiene o May shower; gently cleanse wound with antibacterial soap, rinse and pat dry prior to dressing wounds Medications-Please add to medication list. o Topical Antibiotic - Apply small amount of Medihoney to wound and cover with band-aid every day Wound Treatment Wound #1 -  Chest Wound Laterality: Midline Cleanser: Soap and Water 1 x Per Day/15 Days Discharge Instructions: Gently cleanse wound with antibacterial soap, rinse and pat dry prior to dressing wounds Primary Dressing: Medihoney - Gel, 1.5 (oz), tube 1 x Per Day/15 Days Secondary Dressing: Coverlet Latex-Free Fabric Adhesive Dressings 1 x Per Day/15 Days Discharge Instructions: 1.5 x 2 Secondary Dressing: Gauze 1 x Per Day/15 Days Discharge Instructions: As directed: dry, moistened with saline or moistened with Dakins Solution Electronic Signature(s) Signed: 01/02/2022 1:48:26 PM By: Geralyn Corwin DO Signed: 01/02/2022 3:48:03 PM By: Angelina Pih Entered By: Angelina Pih on 01/02/2022 13:18:02 [name], [name] ([id number]) -------------------------------------------------------------------------------- Problem List Details Patient Name: [name], [name] Date of Service: 01/02/2022 1:00 PM Medical Record Number: [id number] Patient Account Number: [id number] Date of Birth/Sex: 1955/06/25 (66 y.o. F) Treating RN: Angelina Pih Primary Care Provider: [name] Other Clinician: Referring Provider: [name] Treating Provider/Extender: Tilda Franco in Treatment: 3 Active Problems ICD-10 Encounter Code Description Active Date MDM Diagnosis S21.109A Unspecified open wound of unspecified front wall of thorax without 12/12/2021 No Yes penetration into  thoracic cavity, initial encounter L72.8 Other follicular cysts of the skin and subcutaneous tissue 12/12/2021 No Yes G35 Multiple sclerosis 12/12/2021 No Yes Inactive Problems Resolved Problems Electronic Signature(s) Signed: 01/02/2022 1:48:26 PM By: Geralyn Corwin DO Entered By: Geralyn Corwin on 01/02/2022 13:11:58 [name], [name] ([id number]) -------------------------------------------------------------------------------- Progress Note Details Patient Name: [name], [name] Date of Service: 01/02/2022 1:00 PM Medical Record Number: [id number] Patient Account Number: [id number] Date of Birth/Sex: 16-Jan-1956 (66 y.o. F) Treating RN: Angelina Pih Primary Care Provider: [name] Other Clinician: Referring Provider: [name] Treating Provider/Extender: Tilda Franco in Treatment: 3 Subjective Chief Complaint Information obtained from Patient 12/12/2021; chest wound History of Present Illness (HPI) Admission 12/12/2021 Ms. Keven Soucy is a 66 year old female with a past medical history of multiple sclerosis that presents the clinic for a wound to her chest. She states she had a closed raised area to the site for several years. She states that about 2 months ago this open and ruptured. She states she had a lot of thick drainage. She has been on doxycycline For this issue 1 month ago. She states it still drains serous fluid. She currently denies signs of infection. 6/28; patient presents for follow-up. We started iodoform packing at last clinic visit. She states that is difficult to pack and has not been able to do this for the past few days. She currently denies signs of infection. She states she has her ultrasound scheduled for tomorrow. 7/5; patient presents for follow-up. She has been using Medihoney daily. Apparently her ultrasound got canceled and rescheduled for tomorrow. Unclear why. Patient currently has no issues or complaints today. She  denies signs of infection. 7/12; patient presents for follow-up. She continues to use Medihoney. She has not obtained her ultrasound. She reports improvement in wound healing. Objective Constitutional Vitals Time Taken: 12:59 PM, Height: 65 in, Weight: 141 lbs, BMI: 23.5, Temperature: 98.3 F, Pulse: 71 bpm, Respiratory Rate: 18 breaths/min, Blood Pressure: 119/74 mmHg. General Notes: Chest wall: To the mid xiphoid region next to the left breath there is an open wound with undermining from the 10 to 1 o'clock position. Granulation tissue at the opening. No signs of surrounding infection. Integumentary (Hair, Skin) Wound #1 status is Open. Original cause of wound was Gradually Appeared. The date acquired was: 10/12/2021. The wound has been in treatment 3 weeks. The wound is located on the Midline Chest. The wound  measures 0.1cm length x 0.1cm width x 0.1cm depth; 0.008cm^2 area and 0.001cm^3 volume. There is no tunneling or undermining noted. There is a medium amount of serosanguineous drainage noted. The wound margin is flat and intact. There is no granulation within the wound bed. There is no necrotic tissue within the wound bed. Assessment Active Problems ICD-10 Unspecified open wound of unspecified front wall of thorax without penetration into thoracic cavity, initial encounter Other follicular cysts of the skin and subcutaneous tissue Multiple sclerosis [name], [name] ([id number]) Patient's wound has shown improvement in size and appearance since last clinic visit. I recommended continuing Medihoney. She did not obtain her ultrasound. She has shown signs of improvement weekly. No induration noted today to the periwound. No drainage. At this time its not imperative she obtain the ultrasound. Plan 1. Medihoney 2. Follow-up in 1 week Electronic Signature(s) Signed: 01/02/2022 1:48:26 PM By: Geralyn Corwin DO Entered By: Geralyn Corwin on 01/02/2022 13:15:43 [name], [name]  ([id number]) -------------------------------------------------------------------------------- SuperBill Details Patient Name: [name], [name] Date of Service: 01/02/2022 Medical Record Number: [id number] Patient Account Number: [id number] Date of Birth/Sex: [birth year] (66 y.o. F) Treating RN: Angelina Pih Primary Care Provider: [name] Other Clinician: Referring Provider: [name] Treating Provider/Extender: Tilda Franco in Treatment: 3 Diagnosis Coding ICD-10 Codes Code Description S21.109A Unspecified open wound of unspecified front wall of thorax without penetration into thoracic cavity, initial encounter L72.8 Other follicular cysts of the skin and subcutaneous tissue G35 Multiple sclerosis Facility Procedures CPT4 Code: 81103159 Description: [phone] - WOUND CARE VISIT-LEV 2 EST PT Modifier: Quantity: 1 Physician Procedures CPT4: Description Modifier Quantity Code [id number] - WC PHYS LEVEL 3 - EST PT 1 CPT4: ICD-10 Diagnosis Description S21.109A Unspecified open wound of unspecified front wall of thorax without penetration into thoracic cavity, initial encounter L72.8 Other follicular cysts of the skin and subcutaneous tissue G35 Multiple sclerosis Electronic Signature(s) Signed: 01/02/2022 1:48:26 PM By: Geralyn Corwin DO Signed: 01/02/2022 3:48:03 PM By: Angelina Pih Entered By: Angelina Pih on 01/02/2022 13:18:34

## 2022-01-08 ENCOUNTER — Ambulatory Visit: Payer: 59 | Admitting: Physical Therapy

## 2022-01-09 ENCOUNTER — Encounter (HOSPITAL_BASED_OUTPATIENT_CLINIC_OR_DEPARTMENT_OTHER): Payer: Medicare Other | Admitting: Internal Medicine

## 2022-01-09 DIAGNOSIS — G35 Multiple sclerosis: Secondary | ICD-10-CM

## 2022-01-09 DIAGNOSIS — L728 Other follicular cysts of the skin and subcutaneous tissue: Secondary | ICD-10-CM | POA: Diagnosis not present

## 2022-01-09 DIAGNOSIS — S21109A Unspecified open wound of unspecified front wall of thorax without penetration into thoracic cavity, initial encounter: Secondary | ICD-10-CM

## 2022-01-09 NOTE — Progress Notes (Signed)
Vicki, Benson ([id number]) Visit Report for 01/09/2022 Chief Complaint Document Details Patient Name: Date, [name] Date of Service: 01/09/2022 2:30 PM Medical Record Number: [id number] Patient Account Number: [id number] Date of Birth/Sex: July 17, 1955 (66 y.o. F) Treating RN: Angelina Pih Primary Care Provider: [name] Other Clinician: Referring Provider: [name] Treating Provider/Extender: Tilda Franco in Treatment: 4 Information Obtained from: Patient Chief Complaint 12/12/2021; chest wound Electronic Signature(s) Signed: 01/09/2022 2:34:58 PM By: Geralyn Corwin DO Entered By: Geralyn Corwin on 01/09/2022 13:19:02 Deshaies, Binnie Rail ([id number]) -------------------------------------------------------------------------------- HPI Details Patient Name: [name], [name] Date of Service: 01/09/2022 2:30 PM Medical Record Number: [id number] Patient Account Number: [id number] Date of Birth/Sex: 25-Jul-1955 (66 y.o. F) Treating RN: Angelina Pih Primary Care Provider: [name] Other Clinician: Referring Provider: [name] Treating Provider/Extender: Tilda Franco in Treatment: 4 History of Present Illness HPI Description: Admission 12/12/2021 [name] is a 66 year old female with a past medical history of multiple sclerosis that presents the clinic for a wound to her chest. She states she had a closed raised area to the site for several years. She states that about 2 months ago this open and ruptured. She states she had a lot of thick drainage. She has been on doxycycline For this issue 1 month ago. She states it still drains serous fluid. She currently denies signs of infection. 6/28; patient presents for follow-up. We started iodoform packing at last clinic visit. She states that is difficult to pack and has not been able to do this for the past few days. She currently denies signs of infection. She  states she has her ultrasound scheduled for tomorrow. 7/5; patient presents for follow-up. She has been using Medihoney daily. Apparently her ultrasound got canceled and rescheduled for tomorrow. Unclear why. Patient currently has no issues or complaints today. She denies signs of infection. 7/12; patient presents for follow-up. She continues to use Medihoney. She has not obtained her ultrasound. She reports improvement in wound healing. 7/19; patient presents for follow-up. She has been using Medihoney to the wound bed. She reports no drainage. She has no issues or complaints today. Electronic Signature(s) Signed: 01/09/2022 2:34:58 PM By: Geralyn Corwin DO Entered By: Geralyn Corwin on 01/09/2022 13:19:27 Mckethan, Binnie Rail ([id number]) -------------------------------------------------------------------------------- Physical Exam Details Patient Name: [name], [name] Date of Service: 01/09/2022 2:30 PM Medical Record Number: [id number] Patient Account Number: [id number] Date of Birth/Sex: 1955/07/31 (66 y.o. F) Treating RN: Angelina Pih Primary Care Provider: [name] Other Clinician: Referring Provider: [name] Treating Provider/Extender: Tilda Franco in Treatment: 4 Constitutional . Psychiatric . Notes Chest wall: To the mid xiphoid region there is epithelization to the previous wound site. Healing has occurred with a divot. No signs of surrounding infection. Electronic Signature(s) Signed: 01/09/2022 2:34:58 PM By: Geralyn Corwin DO Entered By: Geralyn Corwin on 01/09/2022 13:20:02 Rogue, Binnie Rail ([id number]) -------------------------------------------------------------------------------- Physician Orders Details Patient Name: Spittler, [name] Date of Service: 01/09/2022 2:30 PM Medical Record Number: [id number] Patient Account Number: [id number] Date of Birth/Sex: [birth year] (66 y.o. F) Treating RN: Angelina Pih Primary Care  Provider: [name] Other Clinician: Referring Provider: [name] Treating Provider/Extender: Tilda Franco in Treatment: 4 Verbal / Phone Orders: No Diagnosis Coding Discharge From [facility] o Discharge from Wound Care Center Treatment Complete - Wound appears healed, please call if any issues arise with healed area. Electronic Signature(s) Signed: 01/09/2022 2:34:58 PM By: Geralyn Corwin DO Entered By: Geralyn Corwin on 01/09/2022 13:20:52 Deguire, Evelyn B. ([id number]) -------------------------------------------------------------------------------- Problem  List Details Patient Name: [name], [name] Date of Service: 01/09/2022 2:30 PM Medical Record Number: [id number] Patient Account Number: [id number] Date of Birth/Sex: 04/05/1956 (66 y.o. F) Treating RN: Angelina Pih Primary Care Provider: [name] Other Clinician: Referring Provider: [name] Treating Provider/Extender: Tilda Franco in Treatment: 4 Active Problems ICD-10 Encounter Code Description Active Date MDM Diagnosis S21.109A Unspecified open wound of unspecified front wall of thorax without 12/12/2021 No Yes penetration into thoracic cavity, initial encounter L72.8 Other follicular cysts of the skin and subcutaneous tissue 12/12/2021 No Yes G35 Multiple sclerosis 12/12/2021 No Yes Inactive Problems Resolved Problems Electronic Signature(s) Signed: 01/09/2022 2:34:58 PM By: Geralyn Corwin DO Entered By: Geralyn Corwin on 01/09/2022 13:18:59 Show, Raylin B. ([id number]) -------------------------------------------------------------------------------- Progress Note Details Patient Name: [name], [name] Date of Service: 01/09/2022 2:30 PM Medical Record Number: [id number] Patient Account Number: [id number] Date of Birth/Sex: 1956-01-15 (66 y.o. F) Treating RN: Angelina Pih Primary Care Provider: [name] Other  Clinician: Referring Provider: [name] Treating Provider/Extender: Tilda Franco in Treatment: 4 Subjective Chief Complaint Information obtained from Patient 12/12/2021; chest wound History of Present Illness (HPI) Admission 12/12/2021 [name] is a 66 year old female with a past medical history of multiple sclerosis that presents the clinic for a wound to her chest. She states she had a closed raised area to the site for several years. She states that about 2 months ago this open and ruptured. She states she had a lot of thick drainage. She has been on doxycycline For this issue 1 month ago. She states it still drains serous fluid. She currently denies signs of infection. 6/28; patient presents for follow-up. We started iodoform packing at last clinic visit. She states that is difficult to pack and has not been able to do this for the past few days. She currently denies signs of infection. She states she has her ultrasound scheduled for tomorrow. 7/5; patient presents for follow-up. She has been using Medihoney daily. Apparently her ultrasound got canceled and rescheduled for tomorrow. Unclear why. Patient currently has no issues or complaints today. She denies signs of infection. 7/12; patient presents for follow-up. She continues to use Medihoney. She has not obtained her ultrasound. She reports improvement in wound healing. 7/19; patient presents for follow-up. She has been using Medihoney to the wound bed. She reports no drainage. She has no issues or complaints today. Objective Constitutional Vitals Time Taken: 1:00 PM, Height: 65 in, Weight: 141 lbs, BMI: 23.5, Temperature: 98.3 F, Pulse: 70 bpm, Respiratory Rate: 18 breaths/min, Blood Pressure: 120/76 mmHg. General Notes: Chest wall: To the mid xiphoid region there is epithelization to the previous wound site. Healing has occurred with a divot. No signs of surrounding infection. Integumentary (Hair,  Skin) Wound #1 status is Healed - Epithelialized. Original cause of wound was Gradually Appeared. The date acquired was: 10/12/2021. The wound has been in treatment 4 weeks. The wound is located on the Midline Chest. The wound measures 0cm length x 0cm width x 0cm depth; 0cm^2 area and 0cm^3 volume. There is no tunneling or undermining noted. There is a none present amount of drainage noted. The wound margin is flat and intact. There is no granulation within the wound bed. There is no necrotic tissue within the wound bed. Assessment Active Problems ICD-10 Unspecified open wound of unspecified front wall of thorax without penetration into thoracic cavity, initial encounter Other follicular cysts of the skin and subcutaneous tissue Multiple sclerosis Pytel, Levern B. ([id number]) Patient has done well  with Medihoney. Her wound is healed. She may follow-up as needed. Plan Discharge From [facility]: Discharge from Wound Care Center Treatment Complete - Wound appears healed, please call if any issues arise with healed area. 1. Discharge from clinic due to closed wound 2. Follow-up as needed Electronic Signature(s) Signed: 01/09/2022 2:34:58 PM By: Geralyn Corwin DO Entered By: Geralyn Corwin on 01/09/2022 13:20:28 Streb, Binnie Rail ([id number]) -------------------------------------------------------------------------------- ROS/PFSH Details Patient Name: [name], [name] Date of Service: 01/09/2022 2:30 PM Medical Record Number: [id number] Patient Account Number: [id number] Date of Birth/Sex: 04-25-1956 (66 y.o. F) Treating RN: Angelina Pih Primary Care Provider: [name] Other Clinician: Referring Provider: [name] Treating Provider/Extender: Tilda Franco in Treatment: 4 Respiratory Medical History: Past Medical History Notes: Emphysema Cardiovascular Medical History: Positive for: Hypertension Endocrine Medical History: Negative for: Type  II Diabetes Neurologic Medical History: Past Medical History Notes: MS Immunizations Pneumococcal Vaccine: Received Pneumococcal Vaccination: No Implantable Devices No devices added Family and Social History Current every day smoker; Marital Status - Widowed; Alcohol Use: Never; Drug Use: No History Electronic Signature(s) Signed: 01/09/2022 2:34:58 PM By: Geralyn Corwin DO Signed: 01/09/2022 3:19:57 PM By: Angelina Pih Entered By: Geralyn Corwin on 01/09/2022 13:21:00 Desantiago, Aliyah B. ([id number]) -------------------------------------------------------------------------------- SuperBill Details Patient Name: [name], [name] Date of Service: 01/09/2022 Medical Record Number: [id number] Patient Account Number: [id number] Date of Birth/Sex: 1955-08-30 (66 y.o. F) Treating RN: Angelina Pih Primary Care Provider: [name] Other Clinician: Referring Provider: [name] Treating Provider/Extender: Tilda Franco in Treatment: 4 Diagnosis Coding ICD-10 Codes Code Description S21.109A Unspecified open wound of unspecified front wall of thorax without penetration into thoracic cavity, initial encounter L72.8 Other follicular cysts of the skin and subcutaneous tissue G35 Multiple sclerosis Facility Procedures CPT4 Code: [id number] Description: [phone] - WOUND CARE VISIT-LEV 2 EST PT Modifier: Quantity: 1 Physician Procedures CPT4: Description Modifier Quantity Code [id number] - WC PHYS LEVEL 3 - EST PT 1 CPT4: ICD-10 Diagnosis Description S21.109A Unspecified open wound of unspecified front wall of thorax without penetration into thoracic cavity, initial encounter L72.8 Other follicular cysts of the skin and subcutaneous tissue G35 Multiple sclerosis Electronic Signature(s) Signed: 01/09/2022 2:34:58 PM By: Geralyn Corwin DO Entered By: Geralyn Corwin on 01/09/2022 13:20:40

## 2022-01-09 NOTE — Progress Notes (Signed)
LAKETHIA, COPPESS ([id number]) Visit Report for 01/09/2022 Arrival Information Details Patient Name: [name], [name] Date of Service: 01/09/2022 2:30 PM Medical Record Number: [id number] Patient Account Number: [id number] Date of Birth/Sex: 1956/02/13 (66 y.o. F) Treating RN: Angelina Pih Primary Care Gem Conkle: [name] Other Clinician: Referring Jadier Rockers: [name] Treating Adaleena Mooers/Extender: Tilda Franco in Treatment: 4 Visit Information History Since Last Visit Added or deleted any medications: No Patient Arrived: Dan Humphreys Any new allergies or adverse reactions: No Arrival Time: 12:58 Had a fall or experienced change in No Accompanied By: self activities of daily living that may affect Transfer Assistance: None risk of falls: Patient Identification Verified: Yes Hospitalized since last visit: No Secondary Verification Process Completed: Yes Has Dressing in Place as Prescribed: No Patient Has Alerts: Yes Pain Present Now: No Patient Alerts: Patient on Blood Thinner Not Diabetic Electronic Signature(s) Signed: 01/09/2022 3:19:57 PM By: Angelina Pih Entered By: Angelina Pih on 01/09/2022 12:58:42 Affinito, [name] ([id number]) -------------------------------------------------------------------------------- Clinic Level of Care Assessment Details Patient Name: [name], [name] Date of Service: 01/09/2022 2:30 PM Medical Record Number: [id number] Patient Account Number: [id number] Date of Birth/Sex: 06-05-1956 (66 y.o. F) Treating RN: Angelina Pih Primary Care Cortana Vanderford: [name] Other Clinician: Referring Chamari Cutbirth: [name] Treating Niasia Lanphear/Extender: Tilda Franco in Treatment: 4 Clinic Level of Care Assessment Items TOOL 4 Quantity Score []  - Use when only an EandM is performed on FOLLOW-UP visit 0 ASSESSMENTS - Nursing Assessment / Reassessment X - Reassessment of Co-morbidities (includes  updates in patient status) 1 10 X- 1 5 Reassessment of Adherence to Treatment Plan ASSESSMENTS - Wound and Skin Assessment / Reassessment X - Simple Wound Assessment / Reassessment - one wound 1 5 []  - 0 Complex Wound Assessment / Reassessment - multiple wounds []  - 0 Dermatologic / Skin Assessment (not related to wound area) ASSESSMENTS - Focused Assessment []  - Circumferential Edema Measurements - multi extremities 0 []  - 0 Nutritional Assessment / Counseling / Intervention []  - 0 Lower Extremity Assessment (monofilament, tuning fork, pulses) []  - 0 Peripheral Arterial Disease Assessment (using hand held doppler) ASSESSMENTS - Ostomy and/or Continence Assessment and Care []  - Incontinence Assessment and Management 0 []  - 0 Ostomy Care Assessment and Management (repouching, etc.) PROCESS - Coordination of Care X - Simple Patient / Family Education for ongoing care 1 15 []  - 0 Complex (extensive) Patient / Family Education for ongoing care []  - 0 Staff obtains , Records, Test Results / Process Orders []  - 0 Staff telephones HHA, Nursing Homes / Clarify orders / etc []  - 0 Routine Transfer to another Facility (non-emergent condition) []  - 0 Routine Hospital Admission (non-emergent condition) []  - 0 New Admissions / / Ordering NPWT, Apligraf, etc. []  - 0 Emergency Hospital Admission (emergent condition) X- 1 10 Simple Discharge Coordination []  - 0 Complex (extensive) Discharge Coordination PROCESS - Special Needs []  - Pediatric / Minor Patient Management 0 []  - 0 Isolation Patient Management []  - 0 Hearing / Language / Visual special needs []  - 0 Assessment of Community assistance (transportation, D/C planning, etc.) []  - 0 Additional assistance / Altered mentation []  - 0 Support Surface(s) Assessment (bed, cushion, seat, etc.) INTERVENTIONS - Wound Cleansing / Measurement Groman, Camila B. ( ) X- 1 5 Simple Wound Cleansing  - one wound []  - 0 Complex Wound Cleansing - multiple wounds X- 1 5 Wound Imaging (photographs - any number of wounds) []  - 0 Wound Tracing (instead of photographs) X- 1 5 Simple Wound  Measurement - one wound []  - 0 Complex Wound Measurement - multiple wounds INTERVENTIONS - Wound Dressings X - Small Wound Dressing one or multiple wounds 1 10 []  - 0 Medium Wound Dressing one or multiple wounds []  - 0 Large Wound Dressing one or multiple wounds []  - 0 Application of Medications - topical []  - 0 Application of Medications - injection INTERVENTIONS - Miscellaneous []  - External ear exam 0 []  - 0 Specimen Collection (cultures, biopsies, blood, body fluids, etc.) []  - 0 Specimen(s) / Culture(s) sent or taken to Lab for analysis []  - 0 Patient Transfer (multiple staff / / Similar devices) []  - 0 Simple Staple / Suture removal (25 or less) []  - 0 Complex Staple / Suture removal (26 or more) []  - 0 Hypo / Hyperglycemic Management (close monitor of Blood Glucose) []  - 0 Ankle / Brachial Index (ABI) - do not check if billed separately X- 1 5 Vital Signs Has the patient been seen at the hospital within the last three years: Yes Total Score: 75 Level Of Care: New/Established - Level 2 Electronic Signature(s) Signed: 01/09/2022 3:19:57 PM By: Entered By: on 01/09/2022 13:10:53 [name], ( ) -------------------------------------------------------------------------------- Encounter Discharge Information Details Patient Name: [name], [name] Date of Service: 01/09/2022 2:30 PM Medical Record Number: Patient Account Number: Nurse, adult Date of Birth/Sex: September 11, 1955 (66 y.o. F) Treating RN: Primary Care Kainon Varady: Other Clinician: Referring Ariellah Faust: 01/11/2022 Treating Angela Platner/Extender: Angelina Pih in Treatment: 4 Encounter Discharge Information  Items Discharge Condition: Stable Ambulatory Status: Walker Discharge Destination: Home Transportation: Other Accompanied By: self Schedule Follow-up Appointment: No Clinical Summary of Care: Electronic Signature(s) Signed: 01/09/2022 3:19:57 PM By: 01/11/2022 Entered By: [name] on 01/09/2022 13:12:33 Morelos, 01/11/2022 ([id number]) -------------------------------------------------------------------------------- Lower Extremity Assessment Details Patient Name: [name], [name] Date of Service: 01/09/2022 2:30 PM Medical Record Number: 04/02/1956 Patient Account Number: (63 Date of Birth/Sex: June 07, 1956 (66 y.o. F) Treating RN: [name] Primary Care Migel Hannis: Tilda Franco Other Clinician: Referring Shams Fill: 01/11/2022 Treating Kaileena Obi/Extender: Angelina Pih in Treatment: 4 Electronic Signature(s) Signed: 01/09/2022 3:19:57 PM By: 01/11/2022 Entered By: [name] on 01/09/2022 13:04:24 [place]field, 01/11/2022 ([id number]) -------------------------------------------------------------------------------- Multi Wound Chart Details Patient Name: [name], [name] Date of Service: 01/09/2022 2:30 PM Medical Record Number: 04/02/1956 Patient Account Number: (63 Date of Birth/Sex: 08-Jun-1956 (66 y.o. F) Treating RN: [name] Primary Care Zelpha Messing: Tilda Franco Other Clinician: Referring Meliza Kage: 01/11/2022 Treating Ark Agrusa/Extender: Angelina Pih in Treatment: 4 Vital Signs Height(in): 65 Pulse(bpm): 70 Weight(lbs): 141 Blood Pressure(mmHg): 120/76 Body Mass Index(BMI): 23.5 Temperature(F): 98.3 Respiratory Rate(breaths/min): 18 Photos: [N/A:N/A] Wound Location: Midline Chest N/A N/A Wounding Event: Gradually Appeared N/A N/A Primary Etiology: Abscess N/A N/A Comorbid History: Hypertension N/A N/A Date Acquired: 10/12/2021 N/A N/A Weeks of Treatment: 4 N/A N/A Wound Status:  Open N/A N/A Wound Recurrence: No N/A N/A Measurements L x W x D (cm) 0.1x0.1x0.1 N/A N/A Area (cm) : 0.008 N/A N/A Volume (cm) : 0.001 N/A N/A % Reduction in Area: 93.20% N/A N/A % Reduction in Volume: 97.10% N/A N/A Classification: Full Thickness Without Exposed N/A N/A Support Structures Exudate Amount: None Present N/A N/A Wound Margin: Flat and Intact N/A N/A Granulation Amount: None Present (0%) N/A N/A Necrotic Amount: None Present (0%) N/A N/A Exposed Structures: Fascia: No N/A N/A Fat Layer (Subcutaneous Tissue): No Tendon: No Muscle: No Joint: No Bone: No Epithelialization: Large (67-100%) N/A N/A Treatment  Notes Electronic Signature(s) Signed: 01/09/2022 3:19:57 PM By: Angelina Pih Entered By: Angelina Pih on 01/09/2022 13:04:35 Mosquera, [name] ([id number]) -------------------------------------------------------------------------------- Multi-Disciplinary Care Plan Details Patient Name: [name], [name] Date of Service: 01/09/2022 2:30 PM Medical Record Number: [id number] Patient Account Number: [id number] Date of Birth/Sex: [birth year] (66 y.o. F) Treating RN: Angelina Pih Primary Care Cornella Emmer: [name] Other Clinician: Referring Jalaysha Skilton: [name] Treating Jamila Slatten/Extender: Tilda Franco in Treatment: 4 Active Inactive Electronic Signature(s) Signed: 01/09/2022 3:19:57 PM By: Angelina Pih Entered By: Angelina Pih on 01/09/2022 13:11:30 Zwiebel, [name] ([id number]) -------------------------------------------------------------------------------- Pain Assessment Details Patient Name: [name], [name] Date of Service: 01/09/2022 2:30 PM Medical Record Number: [id number] Patient Account Number: [id number] Date of Birth/Sex: 01-04-1956 (66 y.o. F) Treating RN: Angelina Pih Primary Care Sahil Milner: [name] Other Clinician: Referring Kenyada Hy: [name] Treating Afton Mikelson/Extender:  Tilda Franco in Treatment: 4 Active Problems Location of Pain Severity and Description of Pain Patient Has Paino No Site Locations Rate the pain. Current Pain Level: 0 Pain Management and Medication Current Pain Management: Electronic Signature(s) Signed: 01/09/2022 3:19:57 PM By: Angelina Pih Entered By: Angelina Pih on 01/09/2022 13:00:16 Gilbo, [name] ([id number]) -------------------------------------------------------------------------------- Patient/Caregiver Education Details Patient Name: [name], [name] Date of Service: 01/09/2022 2:30 PM Medical Record Number: [id number] Patient Account Number: [id number] Date of Birth/Gender: [birth year] (66 y.o. F) Treating RN: Angelina Pih Primary Care Physician: [name] Other Clinician: Referring Physician: [name] Treating Physician/Extender: Tilda Franco in Treatment: 4 Education Assessment Education Provided To: Patient Education Topics Provided Wound/Skin Impairment: Handouts: Other: care of healed wound Methods: Explain/Verbal Responses: State content correctly Electronic Signature(s) Signed: 01/09/2022 3:19:57 PM By: Angelina Pih Entered By: Angelina Pih on 01/09/2022 13:11:14 [name], [name] ([id number]) -------------------------------------------------------------------------------- Wound Assessment Details Patient Name: [name], [name] Date of Service: 01/09/2022 2:30 PM Medical Record Number: [id number] Patient Account Number: [id number] Date of Birth/Sex: 01/18/1956 (66 y.o. F) Treating RN: Angelina Pih Primary Care Gavriella Hearst: [name] Other Clinician: Referring Abbigael Detlefsen: [name] Treating Areal Cochrane/Extender: Tilda Franco in Treatment: 4 Wound Status Wound Number: 1 Primary Etiology: Abscess Wound Location: Midline Chest Wound Status: Healed - Epithelialized Wounding Event: Gradually Appeared Comorbid  History: Hypertension Date Acquired: 10/12/2021 Weeks Of Treatment: 4 Clustered Wound: No Photos Wound Measurements Length: (cm) 0 Width: (cm) 0 Depth: (cm) 0 Area: (cm) 0 Volume: (cm) 0 % Reduction in Area: 100% % Reduction in Volume: 100% Epithelialization: Large (67-100%) Tunneling: No Undermining: No Wound Description Classification: Full Thickness Without Exposed Support Structure Wound Margin: Flat and Intact Exudate Amount: None Present s Foul Odor After Cleansing: No Slough/Fibrino Yes Wound Bed Granulation Amount: None Present (0%) Exposed Structure Necrotic Amount: None Present (0%) Fascia Exposed: No Fat Layer (Subcutaneous Tissue) Exposed: No Tendon Exposed: No Muscle Exposed: No Joint Exposed: No Bone Exposed: No Treatment Notes Wound #1 (Chest) Wound Laterality: Midline Cleanser Peri-Wound Care Topical Primary Dressing [name], [name] ([id number]) Secondary Dressing Secured With Compression Wrap Compression Stockings Add-Ons Electronic Signature(s) Signed: 01/09/2022 3:19:57 PM By: Angelina Pih Entered By: Angelina Pih on 01/09/2022 13:06:59 [name], [name] ([id number]) -------------------------------------------------------------------------------- Vitals Details Patient Name: Vicki Munch B. Date of Service: 01/09/2022 2:30 PM Medical Record Number: [id number] Patient Account Number: [id number] Date of Birth/Sex: [birth year] (66 y.o. F) Treating RN: Angelina Pih Primary Care Roshaun Pound: [name] Other Clinician: Referring Amaad Byers: [name] Treating Mayvis Agudelo/Extender: Tilda Franco in Treatment: 4 Vital Signs Time Taken: 13:00 Temperature (F): 98.3 Height (in): 65 Pulse (bpm): 70 Weight (lbs): 141 Respiratory Rate (breaths/min):  18 Body Mass Index (BMI): 23.5 Blood Pressure (mmHg): 120/76 Reference Range: 80 - 120 mg / dl Electronic Signature(s) Signed: 01/09/2022 3:19:57 PM By: Angelina Pih Entered By: Angelina Pih on 01/09/2022 13:00:09

## 2022-01-10 ENCOUNTER — Ambulatory Visit: Payer: 59 | Admitting: Physical Therapy

## 2022-01-14 ENCOUNTER — Ambulatory Visit: Payer: 59 | Admitting: Physical Therapy

## 2022-01-16 ENCOUNTER — Ambulatory Visit: Payer: 59 | Admitting: Physical Therapy

## 2022-01-21 ENCOUNTER — Ambulatory Visit: Payer: 59 | Admitting: Physical Therapy

## 2022-01-23 ENCOUNTER — Ambulatory Visit: Payer: 59 | Admitting: Physical Therapy

## 2022-01-28 ENCOUNTER — Ambulatory Visit: Payer: 59 | Admitting: Physical Therapy

## 2022-01-28 ENCOUNTER — Telehealth: Payer: Self-pay | Admitting: Family Medicine

## 2022-01-28 NOTE — Telephone Encounter (Signed)
Annette from [facility] Sent [facility] 3051 last week / she asked for an update on this and if it can be sent back to her / she faxed it again today / personal care services

## 2022-01-28 NOTE — Telephone Encounter (Signed)
I have the form. It is the Personal Care Services standard order form.  She will be required to have an in person or a virtual visit in order to complete this form.  Please notify her to schedule this apt.  Also for future reference, all Personal Care Services order requests require documented office visit within 90 days to complete the form.   [name], DO [facility] 01/28/2022, 2:44 PM

## 2022-01-30 ENCOUNTER — Ambulatory Visit: Payer: 59 | Admitting: Physical Therapy

## 2022-01-30 NOTE — Telephone Encounter (Signed)
Third message left on pt's machine to call and schedule an appt.

## 2022-01-31 ENCOUNTER — Ambulatory Visit: Payer: Medicare Other | Admitting: Podiatry

## 2022-02-04 ENCOUNTER — Ambulatory Visit: Payer: 59 | Admitting: Physical Therapy

## 2022-02-05 ENCOUNTER — Ambulatory Visit: Payer: Medicare Other | Admitting: Podiatry

## 2022-02-06 ENCOUNTER — Ambulatory Visit: Payer: 59 | Admitting: Physical Therapy

## 2022-02-11 ENCOUNTER — Ambulatory Visit: Payer: 59 | Admitting: Physical Therapy

## 2022-02-12 DIAGNOSIS — H4720 Unspecified optic atrophy: Secondary | ICD-10-CM | POA: Diagnosis not present

## 2022-02-12 DIAGNOSIS — H2513 Age-related nuclear cataract, bilateral: Secondary | ICD-10-CM | POA: Diagnosis not present

## 2022-02-12 DIAGNOSIS — G379 Demyelinating disease of central nervous system, unspecified: Secondary | ICD-10-CM | POA: Diagnosis not present

## 2022-02-13 ENCOUNTER — Ambulatory Visit: Payer: 59 | Admitting: Physical Therapy

## 2022-02-18 ENCOUNTER — Ambulatory Visit: Payer: 59 | Admitting: Physical Therapy

## 2022-02-20 ENCOUNTER — Ambulatory Visit: Payer: 59 | Admitting: Physical Therapy

## 2022-03-01 ENCOUNTER — Other Ambulatory Visit: Payer: Self-pay | Admitting: Family Medicine

## 2022-03-01 DIAGNOSIS — J432 Centrilobular emphysema: Secondary | ICD-10-CM

## 2022-03-04 NOTE — Telephone Encounter (Signed)
Requested Prescriptions  Pending Prescriptions Disp Refills  . albuterol (VENTOLIN HFA) 108 (90 Base) MCG/ACT inhaler [Pharmacy Med Name: albuterol sulfate HFA 90 mcg/actuation aerosol inhaler] 8.5 g 0    Sig: INHALE TWO PUFFS BY MOUTH EVERY 6 HOURS AS NEEDED FOR WHEEZING AND SHORTNESS OF BREATH (BULK)     Pulmonology:  Beta Agonists 2 Passed - 03/01/2022  2:11 AM      Passed - Last BP in normal range    BP Readings from Last 1 Encounters:  11/20/21 124/62         Passed - Last Heart Rate in normal range    Pulse Readings from Last 1 Encounters:  11/20/21 76         Passed - Valid encounter within last 12 months    Recent Outpatient Visits          3 months ago Chronic skin ulcer with fat layer exposed [facility])   [facility] [place], Netta Neat, DO   10 months ago Multiple sclerosis, relapsing-remitting [facility])   [facility] Smitty Cords, DO   1 year ago Multiple sclerosis, relapsing-remitting [facility])   [facility] Smitty Cords, DO   1 year ago Multiple sclerosis, relapsing-remitting [facility])   [facility] Smitty Cords, DO   2 years ago Multiple sclerosis, relapsing-remitting [facility])   [facility] [place], Netta Neat, DO

## 2022-03-14 ENCOUNTER — Telehealth: Payer: Self-pay

## 2022-03-14 NOTE — Telephone Encounter (Signed)
Copied from [place] [phone]. Topic: General - Other >> Mar 13, 2022 10:31 AM Everette C wrote: Reason for CRM: Anne Ng with [employer] has called for an update on previously submitted paperwork for the patient   Anne Ng shares that the 3051 form was originally submitted on 01/24/22 and resubmitted on 03/13/22   03/14/22- I called and spoke with Anne Ng and told her that I have the paperwork but Dr. Raliegh Ip said she needs an appt since her last OV was 11/20/21.   She is going to be getting in touch with her friend to call us back and make an appt.

## 2022-03-19 DIAGNOSIS — M545 Low back pain, unspecified: Secondary | ICD-10-CM | POA: Diagnosis not present

## 2022-03-19 DIAGNOSIS — G35 Multiple sclerosis: Secondary | ICD-10-CM | POA: Diagnosis not present

## 2022-03-19 DIAGNOSIS — M79605 Pain in left leg: Secondary | ICD-10-CM | POA: Diagnosis not present

## 2022-03-19 DIAGNOSIS — E538 Deficiency of other specified B group vitamins: Secondary | ICD-10-CM | POA: Diagnosis not present

## 2022-03-19 DIAGNOSIS — E559 Vitamin D deficiency, unspecified: Secondary | ICD-10-CM | POA: Diagnosis not present

## 2022-03-19 DIAGNOSIS — R262 Difficulty in walking, not elsewhere classified: Secondary | ICD-10-CM | POA: Diagnosis not present

## 2022-03-19 DIAGNOSIS — H539 Unspecified visual disturbance: Secondary | ICD-10-CM | POA: Diagnosis not present

## 2022-03-19 DIAGNOSIS — G8929 Other chronic pain: Secondary | ICD-10-CM | POA: Diagnosis not present

## 2022-03-28 ENCOUNTER — Telehealth: Payer: Self-pay

## 2022-03-28 DIAGNOSIS — Z1231 Encounter for screening mammogram for malignant neoplasm of breast: Secondary | ICD-10-CM

## 2022-03-28 NOTE — Telephone Encounter (Signed)
Is this okay to order?  Thanks,   -Tanyia Grabbe  

## 2022-03-28 NOTE — Telephone Encounter (Signed)
Copied from [place]. Topic: Referral - Status >> Mar 28, 2022  1:32 PM Ja-Kwan M wrote: Reason for CRM: [facility] with [facility] requests that a referral order for mammogram be placed.

## 2022-03-28 NOTE — Telephone Encounter (Signed)
Yes no problem. It looks like she is already scheduled for mammogram 04/29/22  Order was in back in May 2023  Nobie Putnam, [facility] 03/28/2022, 4:17 PM

## 2022-04-01 ENCOUNTER — Encounter: Payer: Self-pay | Admitting: Family Medicine

## 2022-04-01 ENCOUNTER — Ambulatory Visit (INDEPENDENT_AMBULATORY_CARE_PROVIDER_SITE_OTHER): Payer: Medicare Other | Admitting: Family Medicine

## 2022-04-01 VITALS — BP 118/71 | HR 86 | Ht 65.0 in | Wt 140.0 lb

## 2022-04-01 DIAGNOSIS — N319 Neuromuscular dysfunction of bladder, unspecified: Secondary | ICD-10-CM

## 2022-04-01 DIAGNOSIS — Z23 Encounter for immunization: Secondary | ICD-10-CM

## 2022-04-01 DIAGNOSIS — J432 Centrilobular emphysema: Secondary | ICD-10-CM

## 2022-04-01 DIAGNOSIS — G35 Multiple sclerosis: Secondary | ICD-10-CM

## 2022-04-01 DIAGNOSIS — Z1211 Encounter for screening for malignant neoplasm of colon: Secondary | ICD-10-CM

## 2022-04-01 DIAGNOSIS — E782 Mixed hyperlipidemia: Secondary | ICD-10-CM

## 2022-04-01 NOTE — Assessment & Plan Note (Signed)
Check fasting lipid panel Refill Pravastatin

## 2022-04-01 NOTE — Patient Instructions (Addendum)
Thank you for coming to the office today.  Lab today for cholesterol  I will complete and fax your paperwork to [facility]  Ordered the Cologuard (home kit) test for colon cancer screening. Stay tuned for further updates.  It will be shipped to you directly. If not received in 2-4 weeks, call us or the company.   If you send it back and no results are received in 2-4 weeks, call us or the company as well!   Colon Cancer Screening: - For all adults age 92+ routine colon cancer screening is highly recommended.     - Recent guidelines from [place] recommend starting age of 71 - Early detection of colon cancer is important, because often there are no warning signs or symptoms, also if found early usually it can be cured. Late stage is hard to treat.   - If Cologuard is NEGATIVE, then it is good for 3 years before next due - If Cologuard is POSITIVE, then it is strongly advised to get a Colonoscopy, which allows the GI doctor to locate the source of the cancer or polyp (even very early stage) and treat it by removing it. ------------------------- Follow instructions to collect sample, you may call the company for any help or questions, 24/7 telephone support at [phone].  Pneumonia shot and Flu Shot   Please schedule a Follow-up Appointment to: Return in about 6 months (around 10/01/2022) for 6 month follow-up MS updates, PCS.  If you have any other questions or concerns, please feel free to call the office or send a message through [place]. You may also schedule an earlier appointment if necessary.  Additionally, you may be receiving a survey about your experience at our office within a few days to 1 week by e-mail or mail. We value your feedback.  Nobie Putnam, DO [facility] [facility]

## 2022-04-01 NOTE — Progress Notes (Addendum)
Subjective:    Patient ID: [name], female    DOB: 04/19/1956, 66 y.o.   MRN: [id number]  [name] is a 66 y.o. female presenting on 04/01/2022 for home health assistance   HPI  Here with home aid / friend caregiver.   Multiple Sclerosis, Chronic relapsing remitting / Neurogenic Bladder / Chronic Pain Syndrome Followed by [facility] Dr Malvin Johns  Last visit with me for routine visit 04/2021 She has done well. Still has caregiver support. Home aide She continues with Home Health aide most days help with house work cleaning, grocery store and meal prep, medications. Needs new request Personal Care Services - PSC - received orders from [facility]  Chronic Left leg drop, she has worked on this with PT. She uses rolling walker. She is able to ambulate using this, she does not drive, transportation brought her to apt today   She utilizes a walker for ambulation assistance at home and out of house. She uses Rollator Walker   Concern with some gradual cognitive decline still in setting of MS. No recent flare with MS For her pain she was referred to [facility] pain management, has had prior MRI Lumbar   - Regarding MS medications Continues on medication management.   - For pain and function she takes the following: - Taking Tramadol 50mg  nightly PRN pain, refilled by Neuro in past.  - Taking Gabapentin 300mg  x 2 in AM, 2 in afternoon, and 3 PM - with improvement - Taking Cyclobenzaprine 10mg  QHS PRN - OFF Baclofen   Difficulty getting out of bed at times, but once up she can function  - Has issues with neurogenic bladder from MS, still issues with some overflow and urinary incontinence, she has urge to go, now more frequently, improved on Myrbetriq 50mg  daily, still on using pads / depends for medical supply for her urinary incontinence that is needed to help keep her dry and avoid recurrent UTI. She is doing better with hygiene and cleaning after  soiling. Admits still episodic low back pain at times, worse at night, sore and stiff muscles, has tried muscle relaxant in past was helpful. Admits chronic L sided weakness, some episodes of intermittent swelling seem related to inactivity, improve with rest and elevation - seems improved Admits urinary incontinence Denies dysuria, fever chills, hematuria   Additional PMH - Centrilobular Emphysema - doing well needs refill on Spiriva, Albuterol - GERD - needs refill on pantoprazole, controlled   Health Maintenance:   Due for Flu Shot, will receive today   Due Pneumonia vaccine age 76+, Prevnar20 today   Mammogram scheduled   She declines Cologuard, previously ordered, never completed.     04/01/2022   10:55 AM 11/20/2021    8:26 AM 08/23/2021   10:15 AM  Depression screen PHQ 2/9  Decreased Interest 0 0 0  Down, Depressed, Hopeless 0 0 0  PHQ - 2 Score 0 0 0  Altered sleeping 0 0   Tired, decreased energy 0 0   Change in appetite 0 0   Feeling bad or failure about yourself  0 0   Trouble concentrating 1 1   Moving slowly or fidgety/restless 0 0   Suicidal thoughts 0 0   PHQ-9 Score 1 1   Difficult doing work/chores Not difficult at all      Past Medical History:  Diagnosis Date   Headache    Hyperlipidemia    MS (multiple sclerosis) (HCC) 1990   Neurogenic bladder  Past Surgical History:  Procedure Laterality Date   ABDOMINAL HYSTERECTOMY     OOPHORECTOMY Bilateral    Social History   Socioeconomic History   Marital status: Divorced    Spouse name: Not on file   Number of children: 1   Years of education: Not on file   Highest education level: Some college, no degree  Occupational History   Occupation: retired  Tobacco Use   Smoking status: Every Day    Packs/day: 0.25    Years: 20.00    Total pack years: 5.00    Types: Cigars, Cigarettes    Last attempt to quit: 01/03/1995    Years since quitting: 27.2   Smokeless tobacco: Never   Tobacco  comments:    4 cigarettes daily 10/17/2020  Vaping Use   Vaping Use: Never used  Substance and Sexual Activity   Alcohol use: No    Alcohol/week: 0.0 standard drinks of alcohol   Drug use: Not Currently    Comment: smokes pods in past   Sexual activity: Not on file  Other Topics Concern   Not on file  Social History Narrative   Not on file   Social Determinants of Health   Financial Resource Strain: Low Risk  (08/23/2021)   Overall Financial Resource Strain (CARDIA)    Difficulty of Paying Living Expenses: Not hard at all  Food Insecurity: No Food Insecurity (08/23/2021)   Hunger Vital Sign    Worried About Running Out of Food in the Last Year: Never true    Ran Out of Food in the Last Year: Never true  Transportation Needs: No Transportation Needs (08/23/2021)   PRAPARE - Administrator, Civil Service (Medical): No    Lack of Transportation (Non-Medical): No  Physical Activity: Insufficiently Active (08/23/2021)   Exercise Vital Sign    Days of Exercise per Week: 3 days    Minutes of Exercise per Session: 30 min  Stress: No Stress Concern Present (08/23/2021)   [employer] of Occupational Health - Occupational Stress Questionnaire    Feeling of Stress : Not at all  Social Connections: Moderately Isolated (08/23/2021)   Social Connection and Isolation Panel [NHANES]    Frequency of Communication with Friends and Family: More than three times a week    Frequency of Social Gatherings with Friends and Family: More than three times a week    Attends Religious Services: 1 to 4 times per year    Active Member of [employer] or Organizations: No    Attends Banker Meetings: Never    Marital Status: Divorced  Catering manager Violence: Unknown (05/12/2018)   Humiliation, Afraid, Rape, and Kick questionnaire    Fear of Current or Ex-Partner: Patient refused    Emotionally Abused: Patient refused    Physically Abused: Patient refused    Sexually Abused: Patient  refused   Family History  Problem Relation Age of Onset   Diabetes Mother    Heart disease Mother    Diabetes Father    Heart disease Father    Diabetes Sister    Heart disease Sister    Current Outpatient Medications on File Prior to Visit  Medication Sig   albuterol (PROVENTIL) (2.5 MG/3ML) 0.083% nebulizer solution Take 3 mLs (2.5 mg total) by nebulization every 6 (six) hours as needed for wheezing or shortness of breath. (Patient not taking: Reported on 08/23/2021)   albuterol (VENTOLIN HFA) 108 (90 Base) MCG/ACT inhaler INHALE TWO PUFFS BY MOUTH EVERY 6 HOURS  AS NEEDED FOR WHEEZING AND SHORTNESS OF BREATH (BULK)   alendronate (FOSAMAX) 70 MG tablet TAKE 1 TABLET BY MOUTH WEEKLY ON MONDAY AT 7AM TAKE WITH 6-8 OZ WATER & REMAIN UPRIGHT FOR 30 MINUTES AFTER ADMINISTRATION   Cholecalciferol 25 MCG (1000 UT) tablet Take by mouth.   cyclobenzaprine (FLEXERIL) 10 MG tablet Take 1 tablet (10 mg total) by mouth at bedtime as needed for muscle spasms.   dalfampridine 10 MG TB12 Take 10 mg by mouth 2 (two) times daily.   diclofenac (VOLTAREN) 75 MG EC tablet Take 75 mg twice a day for a month then decrease to 75 mg once a day for a month then stop. No over the counter pain relievers except Tylenol take with food   Dimethyl Fumarate 240 MG CPDR Take 240 mg by mouth 2 (two) times daily.   gabapentin (NEURONTIN) 300 MG capsule TAKE TWO CAPSULES BY MOUTH DAILY AT 9 AM and TAKE TWO CAPSULES DAILY AT 2 PM and TAKE THREE CAPSULES DAILY AT 9 PM   mirabegron ER (MYRBETRIQ) 50 MG TB24 tablet TAKE ONE TABLET BY MOUTH DAILY AT 9 AM   Misc. Devices (CANE) MISC 1 each by Does not apply route as needed.   nortriptyline (PAMELOR) 10 MG capsule Take 10 mg by mouth 2 (two) times daily.   pantoprazole (PROTONIX) 40 MG tablet TAKE ONE TABLET BY MOUTH DAILY AT 9 AM BEFORE BREAKFAST   potassium chloride (KLOR-CON) 10 MEQ tablet 1 tablet once daily.   pravastatin (PRAVACHOL) 40 MG tablet TAKE ONE TABLET BY MOUTH DAILY  AT 5 PM   QUEtiapine (SEROQUEL) 50 MG tablet Take 1 tablet by mouth daily.   tiotropium (SPIRIVA HANDIHALER) 18 MCG inhalation capsule INHALE THE CONTENTS OF ONE CAPSULE VIA HANDIHALER DAILY AT THE SAME TIME EVERY DAY (BULK)   traMADol (ULTRAM) 50 MG tablet Take 1 tablet (50 mg total) by mouth at bedtime as needed for moderate pain.   No current facility-administered medications on file prior to visit.    Review of Systems Per HPI unless specifically indicated above     Objective:    BP 118/71   Pulse 86   Ht 5\' 5"  (1.651 m)   Wt 140 lb (63.5 kg)   SpO2 100%   BMI 23.30 kg/m   Wt Readings from Last 3 Encounters:  04/01/22 140 lb (63.5 kg)  11/20/21 140 lb 6.4 oz (63.7 kg)  08/23/21 139 lb 3.2 oz (63.1 kg)    Physical Exam Vitals and nursing note reviewed.  Constitutional:      General: She is not in acute distress.    Appearance: Normal appearance. She is well-developed. She is not diaphoretic.     Comments: Well-appearing, comfortable, cooperative  HENT:     Head: Normocephalic and atraumatic.  Eyes:     General:        Right eye: No discharge.        Left eye: No discharge.     Conjunctiva/sclera: Conjunctivae normal.  Cardiovascular:     Rate and Rhythm: Normal rate and regular rhythm.     Heart sounds: Normal heart sounds. No murmur heard. Pulmonary:     Effort: Pulmonary effort is normal. No respiratory distress.     Breath sounds: Normal breath sounds. No wheezing or rales.  Abdominal:     General: Bowel sounds are normal. There is no distension.     Palpations: Abdomen is soft.     Tenderness: There is no abdominal tenderness.  Musculoskeletal:  Cervical back: Normal range of motion and neck supple.     Comments: Low Back / Lower Extremity Inspection: Normal appearance, no spinal deformity, symmetrical. Palpation: No tenderness over spinous processes. Stable bilateral lower back muscle spasm and hypertonicity, seems R>L ROM: Good intact ROM lower  extremity flex/ext legs, hips Strength: Bilateral knee flex/ext 5/5. IMPROVED chronic residual weakness now nearly 5/5 Left hip/knee/ankle, grip and upper ext intact 5/5 Neurovascular: intact distal sensation to light touch  Uses rollator walker  Skin:    General: Skin is warm and dry.     Findings: No erythema or rash.  Neurological:     Mental Status: She is alert and oriented to person, place, and time.  Psychiatric:        Mood and Affect: Mood normal.        Behavior: Behavior normal.        Thought Content: Thought content normal.     Comments: Well groomed, good eye contact, normal speech and thoughts    Results for orders placed or performed in visit on 10/17/20  CBC with Differential/Platelet  Result Value Ref Range   WBC 6.8 3.8 - 10.8 Thousand/uL   RBC 4.01 3.80 - 5.10 Million/uL   Hemoglobin 13.7 11.7 - 15.5 g/dL   HCT 69.6 29.5 - 28.4 %   MCV 103.0 (H) 80.0 - 100.0 fL   MCH 34.2 (H) 27.0 - 33.0 pg   MCHC 33.2 32.0 - 36.0 g/dL   RDW 13.2 44.0 - 10.2 %   Platelets 201 140 - 400 Thousand/uL   MPV 10.4 7.5 - 12.5 fL   Neutro Abs 5,263 1,500 - 7,800 cells/uL   Lymphs Abs 1,027 850 - 3,900 cells/uL   Absolute Monocytes 476 200 - 950 cells/uL   Eosinophils Absolute 27 15 - 500 cells/uL   Basophils Absolute 7 0 - 200 cells/uL   Neutrophils Relative % 77.4 %   Total Lymphocyte 15.1 %   Monocytes Relative 7.0 %   Eosinophils Relative 0.4 %   Basophils Relative 0.1 %  COMPLETE METABOLIC PANEL WITH GFR  Result Value Ref Range   Glucose, Bld 81 65 - 99 mg/dL   BUN 18 7 - 25 mg/dL   Creat 7.25 3.66 - 4.40 mg/dL   GFR, Est Non African American 98 > OR = 60 mL/min/1.61m2   GFR, Est African American 114 > OR = 60 mL/min/1.36m2   BUN/Creatinine Ratio NOT APPLICABLE 6 - 22 (calc)   Sodium 145 135 - 146 mmol/L   Potassium 3.8 3.5 - 5.3 mmol/L   Chloride 108 98 - 110 mmol/L   CO2 28 20 - 32 mmol/L   Calcium 9.2 8.6 - 10.4 mg/dL   Total Protein 7.1 6.1 - 8.1 g/dL    Albumin 4.4 3.6 - 5.1 g/dL   Globulin 2.7 1.9 - 3.7 g/dL (calc)   AG Ratio 1.6 1.0 - 2.5 (calc)   Total Bilirubin 0.4 0.2 - 1.2 mg/dL   Alkaline phosphatase (APISO) 64 37 - 153 U/L   AST 11 10 - 35 U/L   ALT 8 6 - 29 U/L  Lipid panel  Result Value Ref Range   Cholesterol 169 <200 mg/dL   HDL 67 > OR = 50 mg/dL   Triglycerides 41 <347 mg/dL   LDL Cholesterol (Calc) 90 mg/dL (calc)   Total CHOL/HDL Ratio 2.5 <5.0 (calc)   Non-HDL Cholesterol (Calc) 102 <130 mg/dL (calc)  Hemoglobin Q2V  Result Value Ref Range   Hgb A1c MFr  Bld 5.1 <5.7 % of total Hgb   Mean Plasma Glucose 100 mg/dL   eAG (mmol/L) 5.5 mmol/L  VITAMIN D 25 Hydroxy (Vit-D Deficiency, Fractures)  Result Value Ref Range   Vit D, 25-Hydroxy 21 (L) 30 - 100 ng/mL      Assessment & Plan:   Problem List Items Addressed This Visit     Centrilobular emphysema (HCC)    Stable without exacerbation Goal to quit smoking On Spiriva Continue Albuterol PRN Refill      Mixed hyperlipidemia    Check fasting lipid panel Refill Pravastatin      Relevant Orders   Lipid panel (Completed)   Multiple sclerosis, relapsing-remitting (HCC) - Primary    Chronic relapsing remitting MS. Currently stable without flare Complicated with neurogenic bladder, see A&P Followed by [facility] Dr Malvin Johns Continue current MS targeted therapy per med list (Tecfidera, Ampyra) Completed PT  Pain management on Tramadol, Gabapentin  Continue Flexeril Tramadol Gabapentin Continue Myrbetriq      Neurogenic bladder   Other Visit Diagnoses     Needs flu shot       Relevant Orders   Flu Vaccine QUAD High Dose(Fluad) (Completed)   Need for Streptococcus pneumoniae vaccination       Relevant Orders   Pneumococcal conjugate vaccine 20-valent (Completed)   Screening for colon cancer       Relevant Orders   Cologuard      Vaccines today Prevnar 20 Flu Shot  Due for routine colon cancer screening - Discussion today about  recommendations for either Colonoscopy or Cologuard screening, benefits and risks of screening, interested in Cologuard, understands that if positive then recommendation is for diagnostic colonoscopy to follow-up. - Ordered Cologuard today  Due LIpid panel fasting today, she is overdue Rest of labs are already up to date on chart.  She will benefit from Personal Care Services She has currently home aide now but will benefit from additional caregiver support for ADLs Received paperwork, I will complete and fax your paperwork to [facility]. Medical indication with Multiple Sclerosis   Neurogenic bladder Secondary to Multiple Sclerosis, with mixed overflow incontinence Followed by [facility] Not followed by Urology   Plan Continue Myrbetriq from 50 XL daily - max dose goal to reduce frequency - Indicated for incontinence supplies, pull-ups, continue to use regularly to prevent future UTI risk that would come with soiling. Will continue to treat bladder with Myrbetriq   Orders Placed This Encounter  Procedures   Pneumococcal conjugate vaccine 20-valent   Flu Vaccine QUAD High Dose(Fluad)   Cologuard   Lipid panel    Order Specific Question:   Has the patient fasted?    Answer:   Yes      No orders of the defined types were placed in this encounter.     Follow up plan: Return in about 6 months (around 10/01/2022) for 6 month follow-up MS updates, PCS.  [name], DO [facility] 04/01/2022, 9:02 AM

## 2022-04-01 NOTE — Assessment & Plan Note (Addendum)
Chronic relapsing remitting MS. Currently stable without flare Complicated with neurogenic bladder, see A&P Followed by [facility] Dr Melrose Nakayama Continue current MS targeted therapy per med list (Tecfidera, Ampyra) Completed PT  Pain management on Tramadol, Gabapentin  Continue Flexeril Tramadol Gabapentin Continue Myrbetriq

## 2022-04-01 NOTE — Assessment & Plan Note (Signed)
Stable without exacerbation Goal to quit smoking On Spiriva Continue Albuterol PRN Refill

## 2022-04-02 LAB — LIPID PANEL
Cholesterol: 165 mg/dL (ref <200)
HDL: 65 mg/dL (ref >=50)
LDL Cholesterol (Calc): 86 mg/dL (calc)
Non-HDL Cholesterol (Calc): 100 mg/dL (calc) (ref <130)
Total CHOL/HDL Ratio: 2.5 (calc) (ref <5.0)
Triglycerides: 55 mg/dL (ref <150)

## 2022-04-03 DIAGNOSIS — I10 Essential (primary) hypertension: Secondary | ICD-10-CM | POA: Diagnosis not present

## 2022-04-03 DIAGNOSIS — M79606 Pain in leg, unspecified: Secondary | ICD-10-CM | POA: Diagnosis not present

## 2022-04-03 DIAGNOSIS — E785 Hyperlipidemia, unspecified: Secondary | ICD-10-CM | POA: Diagnosis not present

## 2022-04-03 DIAGNOSIS — H548 Legal blindness, as defined in USA: Secondary | ICD-10-CM | POA: Diagnosis not present

## 2022-04-03 DIAGNOSIS — M519 Unspecified thoracic, thoracolumbar and lumbosacral intervertebral disc disorder: Secondary | ICD-10-CM | POA: Diagnosis not present

## 2022-04-03 DIAGNOSIS — Z9181 History of falling: Secondary | ICD-10-CM | POA: Diagnosis not present

## 2022-04-03 DIAGNOSIS — J449 Chronic obstructive pulmonary disease, unspecified: Secondary | ICD-10-CM | POA: Diagnosis not present

## 2022-04-03 DIAGNOSIS — G8929 Other chronic pain: Secondary | ICD-10-CM | POA: Diagnosis not present

## 2022-04-03 DIAGNOSIS — Z87891 Personal history of nicotine dependence: Secondary | ICD-10-CM | POA: Diagnosis not present

## 2022-04-03 DIAGNOSIS — G35 Multiple sclerosis: Secondary | ICD-10-CM | POA: Diagnosis not present

## 2022-04-03 DIAGNOSIS — Z7951 Long term (current) use of inhaled steroids: Secondary | ICD-10-CM | POA: Diagnosis not present

## 2022-04-03 DIAGNOSIS — M545 Low back pain, unspecified: Secondary | ICD-10-CM | POA: Diagnosis not present

## 2022-04-03 DIAGNOSIS — G4730 Sleep apnea, unspecified: Secondary | ICD-10-CM | POA: Diagnosis not present

## 2022-04-03 DIAGNOSIS — F32A Depression, unspecified: Secondary | ICD-10-CM | POA: Diagnosis not present

## 2022-04-03 DIAGNOSIS — K589 Irritable bowel syndrome without diarrhea: Secondary | ICD-10-CM | POA: Diagnosis not present

## 2022-04-08 DIAGNOSIS — Z9181 History of falling: Secondary | ICD-10-CM | POA: Diagnosis not present

## 2022-04-08 DIAGNOSIS — M79606 Pain in leg, unspecified: Secondary | ICD-10-CM | POA: Diagnosis not present

## 2022-04-08 DIAGNOSIS — Z7951 Long term (current) use of inhaled steroids: Secondary | ICD-10-CM | POA: Diagnosis not present

## 2022-04-08 DIAGNOSIS — F32A Depression, unspecified: Secondary | ICD-10-CM | POA: Diagnosis not present

## 2022-04-08 DIAGNOSIS — G35 Multiple sclerosis: Secondary | ICD-10-CM | POA: Diagnosis not present

## 2022-04-08 DIAGNOSIS — Z87891 Personal history of nicotine dependence: Secondary | ICD-10-CM | POA: Diagnosis not present

## 2022-04-08 DIAGNOSIS — H548 Legal blindness, as defined in USA: Secondary | ICD-10-CM | POA: Diagnosis not present

## 2022-04-08 DIAGNOSIS — G8929 Other chronic pain: Secondary | ICD-10-CM | POA: Diagnosis not present

## 2022-04-08 DIAGNOSIS — M519 Unspecified thoracic, thoracolumbar and lumbosacral intervertebral disc disorder: Secondary | ICD-10-CM | POA: Diagnosis not present

## 2022-04-08 DIAGNOSIS — E785 Hyperlipidemia, unspecified: Secondary | ICD-10-CM | POA: Diagnosis not present

## 2022-04-08 DIAGNOSIS — K589 Irritable bowel syndrome without diarrhea: Secondary | ICD-10-CM | POA: Diagnosis not present

## 2022-04-08 DIAGNOSIS — M545 Low back pain, unspecified: Secondary | ICD-10-CM | POA: Diagnosis not present

## 2022-04-08 DIAGNOSIS — I10 Essential (primary) hypertension: Secondary | ICD-10-CM | POA: Diagnosis not present

## 2022-04-08 DIAGNOSIS — G4730 Sleep apnea, unspecified: Secondary | ICD-10-CM | POA: Diagnosis not present

## 2022-04-08 DIAGNOSIS — J449 Chronic obstructive pulmonary disease, unspecified: Secondary | ICD-10-CM | POA: Diagnosis not present

## 2022-04-09 ENCOUNTER — Other Ambulatory Visit: Payer: Self-pay | Admitting: Family Medicine

## 2022-04-09 DIAGNOSIS — J432 Centrilobular emphysema: Secondary | ICD-10-CM

## 2022-04-09 NOTE — Telephone Encounter (Signed)
Requested Prescriptions  Pending Prescriptions Disp Refills  . albuterol (VENTOLIN HFA) 108 (90 Base) MCG/ACT inhaler [Pharmacy Med Name: albuterol sulfate HFA 90 mcg/actuation aerosol inhaler] 8.5 g 5    Sig: INHALE TWO PUFFS BY MOUTH EVERY 6 HOURS AS NEEDED FOR WHEEZING AND SHORTNESS OF BREATH (BULK)     Pulmonology:  Beta Agonists 2 Passed - 04/09/2022  4:17 AM      Passed - Last BP in normal range    BP Readings from Last 1 Encounters:  04/01/22 118/71         Passed - Last Heart Rate in normal range    Pulse Readings from Last 1 Encounters:  04/01/22 86         Passed - Valid encounter within last 12 months    Recent Outpatient Visits          1 week ago Multiple sclerosis, relapsing-remitting ([place])   [place], DO   4 months ago Chronic skin ulcer with fat layer exposed [facility])   [facility] [name], DO   11 months ago Multiple sclerosis, relapsing-remitting [facility])   [place], DO   1 year ago Multiple sclerosis, relapsing-remitting [facility])   [place], DO   1 year ago Multiple sclerosis, relapsing-remitting [facility])   [facility] [place], Devonne Doughty, DO

## 2022-04-13 DIAGNOSIS — M545 Low back pain, unspecified: Secondary | ICD-10-CM | POA: Diagnosis not present

## 2022-04-13 DIAGNOSIS — Z9181 History of falling: Secondary | ICD-10-CM | POA: Diagnosis not present

## 2022-04-13 DIAGNOSIS — M519 Unspecified thoracic, thoracolumbar and lumbosacral intervertebral disc disorder: Secondary | ICD-10-CM | POA: Diagnosis not present

## 2022-04-13 DIAGNOSIS — E785 Hyperlipidemia, unspecified: Secondary | ICD-10-CM | POA: Diagnosis not present

## 2022-04-13 DIAGNOSIS — Z7951 Long term (current) use of inhaled steroids: Secondary | ICD-10-CM | POA: Diagnosis not present

## 2022-04-13 DIAGNOSIS — G8929 Other chronic pain: Secondary | ICD-10-CM | POA: Diagnosis not present

## 2022-04-13 DIAGNOSIS — J449 Chronic obstructive pulmonary disease, unspecified: Secondary | ICD-10-CM | POA: Diagnosis not present

## 2022-04-13 DIAGNOSIS — G35 Multiple sclerosis: Secondary | ICD-10-CM | POA: Diagnosis not present

## 2022-04-13 DIAGNOSIS — H548 Legal blindness, as defined in USA: Secondary | ICD-10-CM | POA: Diagnosis not present

## 2022-04-13 DIAGNOSIS — F32A Depression, unspecified: Secondary | ICD-10-CM | POA: Diagnosis not present

## 2022-04-13 DIAGNOSIS — M79606 Pain in leg, unspecified: Secondary | ICD-10-CM | POA: Diagnosis not present

## 2022-04-13 DIAGNOSIS — Z87891 Personal history of nicotine dependence: Secondary | ICD-10-CM | POA: Diagnosis not present

## 2022-04-13 DIAGNOSIS — G4730 Sleep apnea, unspecified: Secondary | ICD-10-CM | POA: Diagnosis not present

## 2022-04-13 DIAGNOSIS — K589 Irritable bowel syndrome without diarrhea: Secondary | ICD-10-CM | POA: Diagnosis not present

## 2022-04-13 DIAGNOSIS — I10 Essential (primary) hypertension: Secondary | ICD-10-CM | POA: Diagnosis not present

## 2022-04-19 DIAGNOSIS — I10 Essential (primary) hypertension: Secondary | ICD-10-CM | POA: Diagnosis not present

## 2022-04-19 DIAGNOSIS — M545 Low back pain, unspecified: Secondary | ICD-10-CM | POA: Diagnosis not present

## 2022-04-19 DIAGNOSIS — G35 Multiple sclerosis: Secondary | ICD-10-CM | POA: Diagnosis not present

## 2022-04-19 DIAGNOSIS — K589 Irritable bowel syndrome without diarrhea: Secondary | ICD-10-CM | POA: Diagnosis not present

## 2022-04-19 DIAGNOSIS — G8929 Other chronic pain: Secondary | ICD-10-CM | POA: Diagnosis not present

## 2022-04-19 DIAGNOSIS — E785 Hyperlipidemia, unspecified: Secondary | ICD-10-CM | POA: Diagnosis not present

## 2022-04-19 DIAGNOSIS — Z87891 Personal history of nicotine dependence: Secondary | ICD-10-CM | POA: Diagnosis not present

## 2022-04-19 DIAGNOSIS — Z7951 Long term (current) use of inhaled steroids: Secondary | ICD-10-CM | POA: Diagnosis not present

## 2022-04-19 DIAGNOSIS — M519 Unspecified thoracic, thoracolumbar and lumbosacral intervertebral disc disorder: Secondary | ICD-10-CM | POA: Diagnosis not present

## 2022-04-19 DIAGNOSIS — J449 Chronic obstructive pulmonary disease, unspecified: Secondary | ICD-10-CM | POA: Diagnosis not present

## 2022-04-19 DIAGNOSIS — M79606 Pain in leg, unspecified: Secondary | ICD-10-CM | POA: Diagnosis not present

## 2022-04-19 DIAGNOSIS — H548 Legal blindness, as defined in USA: Secondary | ICD-10-CM | POA: Diagnosis not present

## 2022-04-19 DIAGNOSIS — F32A Depression, unspecified: Secondary | ICD-10-CM | POA: Diagnosis not present

## 2022-04-19 DIAGNOSIS — G4730 Sleep apnea, unspecified: Secondary | ICD-10-CM | POA: Diagnosis not present

## 2022-04-19 DIAGNOSIS — Z9181 History of falling: Secondary | ICD-10-CM | POA: Diagnosis not present

## 2022-04-22 DIAGNOSIS — M79606 Pain in leg, unspecified: Secondary | ICD-10-CM | POA: Diagnosis not present

## 2022-04-22 DIAGNOSIS — J449 Chronic obstructive pulmonary disease, unspecified: Secondary | ICD-10-CM | POA: Diagnosis not present

## 2022-04-22 DIAGNOSIS — K589 Irritable bowel syndrome without diarrhea: Secondary | ICD-10-CM | POA: Diagnosis not present

## 2022-04-22 DIAGNOSIS — G4730 Sleep apnea, unspecified: Secondary | ICD-10-CM | POA: Diagnosis not present

## 2022-04-22 DIAGNOSIS — M519 Unspecified thoracic, thoracolumbar and lumbosacral intervertebral disc disorder: Secondary | ICD-10-CM | POA: Diagnosis not present

## 2022-04-22 DIAGNOSIS — F32A Depression, unspecified: Secondary | ICD-10-CM | POA: Diagnosis not present

## 2022-04-22 DIAGNOSIS — E785 Hyperlipidemia, unspecified: Secondary | ICD-10-CM | POA: Diagnosis not present

## 2022-04-22 DIAGNOSIS — G8929 Other chronic pain: Secondary | ICD-10-CM | POA: Diagnosis not present

## 2022-04-22 DIAGNOSIS — Z9181 History of falling: Secondary | ICD-10-CM | POA: Diagnosis not present

## 2022-04-22 DIAGNOSIS — Z87891 Personal history of nicotine dependence: Secondary | ICD-10-CM | POA: Diagnosis not present

## 2022-04-22 DIAGNOSIS — H548 Legal blindness, as defined in USA: Secondary | ICD-10-CM | POA: Diagnosis not present

## 2022-04-22 DIAGNOSIS — I10 Essential (primary) hypertension: Secondary | ICD-10-CM | POA: Diagnosis not present

## 2022-04-22 DIAGNOSIS — Z7951 Long term (current) use of inhaled steroids: Secondary | ICD-10-CM | POA: Diagnosis not present

## 2022-04-22 DIAGNOSIS — G35 Multiple sclerosis: Secondary | ICD-10-CM | POA: Diagnosis not present

## 2022-04-22 DIAGNOSIS — M545 Low back pain, unspecified: Secondary | ICD-10-CM | POA: Diagnosis not present

## 2022-04-25 ENCOUNTER — Telehealth: Payer: Self-pay | Admitting: Family Medicine

## 2022-04-25 DIAGNOSIS — M519 Unspecified thoracic, thoracolumbar and lumbosacral intervertebral disc disorder: Secondary | ICD-10-CM | POA: Diagnosis not present

## 2022-04-25 DIAGNOSIS — H548 Legal blindness, as defined in USA: Secondary | ICD-10-CM | POA: Diagnosis not present

## 2022-04-25 DIAGNOSIS — I10 Essential (primary) hypertension: Secondary | ICD-10-CM | POA: Diagnosis not present

## 2022-04-25 DIAGNOSIS — G8929 Other chronic pain: Secondary | ICD-10-CM | POA: Diagnosis not present

## 2022-04-25 DIAGNOSIS — G35 Multiple sclerosis: Secondary | ICD-10-CM | POA: Diagnosis not present

## 2022-04-25 DIAGNOSIS — G894 Chronic pain syndrome: Secondary | ICD-10-CM

## 2022-04-25 DIAGNOSIS — M545 Low back pain, unspecified: Secondary | ICD-10-CM | POA: Diagnosis not present

## 2022-04-25 DIAGNOSIS — M79606 Pain in leg, unspecified: Secondary | ICD-10-CM | POA: Diagnosis not present

## 2022-04-25 NOTE — Telephone Encounter (Signed)
[facility] [place], [place] Phone: [phone]  Fax: [phone]      traMADol (ULTRAM) 50 MG tablet    Pharmacy called stating had faxed over refill request of the above med last wk.  Do not see incoming, Caller wanted to make sure that office was aware to be on the lookout for another incoming fax.

## 2022-04-26 MED ORDER — TRAMADOL HCL 50 MG PO TABS: 50.0000 mg | ORAL_TABLET | Freq: Every evening | ORAL | 5 refills | Status: DC | PRN

## 2022-04-26 NOTE — Telephone Encounter (Signed)
Ordered med to [facility]

## 2022-04-29 DIAGNOSIS — Z87891 Personal history of nicotine dependence: Secondary | ICD-10-CM | POA: Diagnosis not present

## 2022-04-29 DIAGNOSIS — Z7951 Long term (current) use of inhaled steroids: Secondary | ICD-10-CM | POA: Diagnosis not present

## 2022-04-29 DIAGNOSIS — Z9181 History of falling: Secondary | ICD-10-CM | POA: Diagnosis not present

## 2022-04-29 DIAGNOSIS — F32A Depression, unspecified: Secondary | ICD-10-CM | POA: Diagnosis not present

## 2022-04-29 DIAGNOSIS — G8929 Other chronic pain: Secondary | ICD-10-CM | POA: Diagnosis not present

## 2022-04-29 DIAGNOSIS — K589 Irritable bowel syndrome without diarrhea: Secondary | ICD-10-CM | POA: Diagnosis not present

## 2022-04-29 DIAGNOSIS — M79606 Pain in leg, unspecified: Secondary | ICD-10-CM | POA: Diagnosis not present

## 2022-04-29 DIAGNOSIS — J449 Chronic obstructive pulmonary disease, unspecified: Secondary | ICD-10-CM | POA: Diagnosis not present

## 2022-04-29 DIAGNOSIS — G35 Multiple sclerosis: Secondary | ICD-10-CM | POA: Diagnosis not present

## 2022-04-29 DIAGNOSIS — G4730 Sleep apnea, unspecified: Secondary | ICD-10-CM | POA: Diagnosis not present

## 2022-04-29 DIAGNOSIS — H548 Legal blindness, as defined in USA: Secondary | ICD-10-CM | POA: Diagnosis not present

## 2022-04-29 DIAGNOSIS — I10 Essential (primary) hypertension: Secondary | ICD-10-CM | POA: Diagnosis not present

## 2022-04-29 DIAGNOSIS — M545 Low back pain, unspecified: Secondary | ICD-10-CM | POA: Diagnosis not present

## 2022-04-29 DIAGNOSIS — E785 Hyperlipidemia, unspecified: Secondary | ICD-10-CM | POA: Diagnosis not present

## 2022-04-29 DIAGNOSIS — M519 Unspecified thoracic, thoracolumbar and lumbosacral intervertebral disc disorder: Secondary | ICD-10-CM | POA: Diagnosis not present

## 2022-05-03 DIAGNOSIS — F32A Depression, unspecified: Secondary | ICD-10-CM | POA: Diagnosis not present

## 2022-05-03 DIAGNOSIS — Z87891 Personal history of nicotine dependence: Secondary | ICD-10-CM | POA: Diagnosis not present

## 2022-05-03 DIAGNOSIS — G8929 Other chronic pain: Secondary | ICD-10-CM | POA: Diagnosis not present

## 2022-05-03 DIAGNOSIS — H548 Legal blindness, as defined in USA: Secondary | ICD-10-CM | POA: Diagnosis not present

## 2022-05-03 DIAGNOSIS — K589 Irritable bowel syndrome without diarrhea: Secondary | ICD-10-CM | POA: Diagnosis not present

## 2022-05-03 DIAGNOSIS — Z7951 Long term (current) use of inhaled steroids: Secondary | ICD-10-CM | POA: Diagnosis not present

## 2022-05-03 DIAGNOSIS — M545 Low back pain, unspecified: Secondary | ICD-10-CM | POA: Diagnosis not present

## 2022-05-03 DIAGNOSIS — M79606 Pain in leg, unspecified: Secondary | ICD-10-CM | POA: Diagnosis not present

## 2022-05-03 DIAGNOSIS — E785 Hyperlipidemia, unspecified: Secondary | ICD-10-CM | POA: Diagnosis not present

## 2022-05-03 DIAGNOSIS — G4730 Sleep apnea, unspecified: Secondary | ICD-10-CM | POA: Diagnosis not present

## 2022-05-03 DIAGNOSIS — Z9181 History of falling: Secondary | ICD-10-CM | POA: Diagnosis not present

## 2022-05-03 DIAGNOSIS — M519 Unspecified thoracic, thoracolumbar and lumbosacral intervertebral disc disorder: Secondary | ICD-10-CM | POA: Diagnosis not present

## 2022-05-03 DIAGNOSIS — I10 Essential (primary) hypertension: Secondary | ICD-10-CM | POA: Diagnosis not present

## 2022-05-03 DIAGNOSIS — J449 Chronic obstructive pulmonary disease, unspecified: Secondary | ICD-10-CM | POA: Diagnosis not present

## 2022-05-03 DIAGNOSIS — G35 Multiple sclerosis: Secondary | ICD-10-CM | POA: Diagnosis not present

## 2022-05-09 DIAGNOSIS — M519 Unspecified thoracic, thoracolumbar and lumbosacral intervertebral disc disorder: Secondary | ICD-10-CM | POA: Diagnosis not present

## 2022-05-09 DIAGNOSIS — I10 Essential (primary) hypertension: Secondary | ICD-10-CM | POA: Diagnosis not present

## 2022-05-09 DIAGNOSIS — K589 Irritable bowel syndrome without diarrhea: Secondary | ICD-10-CM | POA: Diagnosis not present

## 2022-05-09 DIAGNOSIS — F32A Depression, unspecified: Secondary | ICD-10-CM | POA: Diagnosis not present

## 2022-05-09 DIAGNOSIS — Z9181 History of falling: Secondary | ICD-10-CM | POA: Diagnosis not present

## 2022-05-09 DIAGNOSIS — M79606 Pain in leg, unspecified: Secondary | ICD-10-CM | POA: Diagnosis not present

## 2022-05-09 DIAGNOSIS — G8929 Other chronic pain: Secondary | ICD-10-CM | POA: Diagnosis not present

## 2022-05-09 DIAGNOSIS — G4730 Sleep apnea, unspecified: Secondary | ICD-10-CM | POA: Diagnosis not present

## 2022-05-09 DIAGNOSIS — G35 Multiple sclerosis: Secondary | ICD-10-CM | POA: Diagnosis not present

## 2022-05-09 DIAGNOSIS — E785 Hyperlipidemia, unspecified: Secondary | ICD-10-CM | POA: Diagnosis not present

## 2022-05-09 DIAGNOSIS — Z87891 Personal history of nicotine dependence: Secondary | ICD-10-CM | POA: Diagnosis not present

## 2022-05-09 DIAGNOSIS — H548 Legal blindness, as defined in USA: Secondary | ICD-10-CM | POA: Diagnosis not present

## 2022-05-09 DIAGNOSIS — J449 Chronic obstructive pulmonary disease, unspecified: Secondary | ICD-10-CM | POA: Diagnosis not present

## 2022-05-09 DIAGNOSIS — M545 Low back pain, unspecified: Secondary | ICD-10-CM | POA: Diagnosis not present

## 2022-05-09 DIAGNOSIS — Z7951 Long term (current) use of inhaled steroids: Secondary | ICD-10-CM | POA: Diagnosis not present

## 2022-05-14 DIAGNOSIS — G8929 Other chronic pain: Secondary | ICD-10-CM | POA: Diagnosis not present

## 2022-05-14 DIAGNOSIS — M519 Unspecified thoracic, thoracolumbar and lumbosacral intervertebral disc disorder: Secondary | ICD-10-CM | POA: Diagnosis not present

## 2022-05-14 DIAGNOSIS — M79606 Pain in leg, unspecified: Secondary | ICD-10-CM | POA: Diagnosis not present

## 2022-05-14 DIAGNOSIS — H548 Legal blindness, as defined in USA: Secondary | ICD-10-CM | POA: Diagnosis not present

## 2022-05-14 DIAGNOSIS — E785 Hyperlipidemia, unspecified: Secondary | ICD-10-CM | POA: Diagnosis not present

## 2022-05-14 DIAGNOSIS — K589 Irritable bowel syndrome without diarrhea: Secondary | ICD-10-CM | POA: Diagnosis not present

## 2022-05-14 DIAGNOSIS — M545 Low back pain, unspecified: Secondary | ICD-10-CM | POA: Diagnosis not present

## 2022-05-14 DIAGNOSIS — I10 Essential (primary) hypertension: Secondary | ICD-10-CM | POA: Diagnosis not present

## 2022-05-14 DIAGNOSIS — J449 Chronic obstructive pulmonary disease, unspecified: Secondary | ICD-10-CM | POA: Diagnosis not present

## 2022-05-14 DIAGNOSIS — Z9181 History of falling: Secondary | ICD-10-CM | POA: Diagnosis not present

## 2022-05-14 DIAGNOSIS — G35 Multiple sclerosis: Secondary | ICD-10-CM | POA: Diagnosis not present

## 2022-05-14 DIAGNOSIS — G4730 Sleep apnea, unspecified: Secondary | ICD-10-CM | POA: Diagnosis not present

## 2022-05-14 DIAGNOSIS — Z87891 Personal history of nicotine dependence: Secondary | ICD-10-CM | POA: Diagnosis not present

## 2022-05-14 DIAGNOSIS — F32A Depression, unspecified: Secondary | ICD-10-CM | POA: Diagnosis not present

## 2022-05-14 DIAGNOSIS — Z7951 Long term (current) use of inhaled steroids: Secondary | ICD-10-CM | POA: Diagnosis not present

## 2022-06-05 DIAGNOSIS — M545 Low back pain, unspecified: Secondary | ICD-10-CM | POA: Diagnosis not present

## 2022-06-05 DIAGNOSIS — K589 Irritable bowel syndrome without diarrhea: Secondary | ICD-10-CM | POA: Diagnosis not present

## 2022-06-05 DIAGNOSIS — M519 Unspecified thoracic, thoracolumbar and lumbosacral intervertebral disc disorder: Secondary | ICD-10-CM | POA: Diagnosis not present

## 2022-06-05 DIAGNOSIS — E785 Hyperlipidemia, unspecified: Secondary | ICD-10-CM | POA: Diagnosis not present

## 2022-06-05 DIAGNOSIS — Z9181 History of falling: Secondary | ICD-10-CM | POA: Diagnosis not present

## 2022-06-05 DIAGNOSIS — Z87891 Personal history of nicotine dependence: Secondary | ICD-10-CM | POA: Diagnosis not present

## 2022-06-05 DIAGNOSIS — G4730 Sleep apnea, unspecified: Secondary | ICD-10-CM | POA: Diagnosis not present

## 2022-06-05 DIAGNOSIS — G35 Multiple sclerosis: Secondary | ICD-10-CM | POA: Diagnosis not present

## 2022-06-05 DIAGNOSIS — J449 Chronic obstructive pulmonary disease, unspecified: Secondary | ICD-10-CM | POA: Diagnosis not present

## 2022-06-05 DIAGNOSIS — H548 Legal blindness, as defined in USA: Secondary | ICD-10-CM | POA: Diagnosis not present

## 2022-06-05 DIAGNOSIS — M79606 Pain in leg, unspecified: Secondary | ICD-10-CM | POA: Diagnosis not present

## 2022-06-05 DIAGNOSIS — F32A Depression, unspecified: Secondary | ICD-10-CM | POA: Diagnosis not present

## 2022-06-05 DIAGNOSIS — G8929 Other chronic pain: Secondary | ICD-10-CM | POA: Diagnosis not present

## 2022-06-05 DIAGNOSIS — I10 Essential (primary) hypertension: Secondary | ICD-10-CM | POA: Diagnosis not present

## 2022-06-05 DIAGNOSIS — Z7951 Long term (current) use of inhaled steroids: Secondary | ICD-10-CM | POA: Diagnosis not present

## 2022-06-14 DIAGNOSIS — G4730 Sleep apnea, unspecified: Secondary | ICD-10-CM | POA: Diagnosis not present

## 2022-06-14 DIAGNOSIS — K589 Irritable bowel syndrome without diarrhea: Secondary | ICD-10-CM | POA: Diagnosis not present

## 2022-06-14 DIAGNOSIS — H548 Legal blindness, as defined in USA: Secondary | ICD-10-CM | POA: Diagnosis not present

## 2022-06-14 DIAGNOSIS — Z7951 Long term (current) use of inhaled steroids: Secondary | ICD-10-CM | POA: Diagnosis not present

## 2022-06-14 DIAGNOSIS — F32A Depression, unspecified: Secondary | ICD-10-CM | POA: Diagnosis not present

## 2022-06-14 DIAGNOSIS — M79606 Pain in leg, unspecified: Secondary | ICD-10-CM | POA: Diagnosis not present

## 2022-06-14 DIAGNOSIS — G35 Multiple sclerosis: Secondary | ICD-10-CM | POA: Diagnosis not present

## 2022-06-14 DIAGNOSIS — M519 Unspecified thoracic, thoracolumbar and lumbosacral intervertebral disc disorder: Secondary | ICD-10-CM | POA: Diagnosis not present

## 2022-06-14 DIAGNOSIS — M545 Low back pain, unspecified: Secondary | ICD-10-CM | POA: Diagnosis not present

## 2022-06-14 DIAGNOSIS — G8929 Other chronic pain: Secondary | ICD-10-CM | POA: Diagnosis not present

## 2022-06-14 DIAGNOSIS — J449 Chronic obstructive pulmonary disease, unspecified: Secondary | ICD-10-CM | POA: Diagnosis not present

## 2022-06-14 DIAGNOSIS — Z9181 History of falling: Secondary | ICD-10-CM | POA: Diagnosis not present

## 2022-06-14 DIAGNOSIS — E785 Hyperlipidemia, unspecified: Secondary | ICD-10-CM | POA: Diagnosis not present

## 2022-06-14 DIAGNOSIS — I10 Essential (primary) hypertension: Secondary | ICD-10-CM | POA: Diagnosis not present

## 2022-06-14 DIAGNOSIS — Z87891 Personal history of nicotine dependence: Secondary | ICD-10-CM | POA: Diagnosis not present

## 2022-06-19 DIAGNOSIS — Z7951 Long term (current) use of inhaled steroids: Secondary | ICD-10-CM | POA: Diagnosis not present

## 2022-06-19 DIAGNOSIS — J449 Chronic obstructive pulmonary disease, unspecified: Secondary | ICD-10-CM | POA: Diagnosis not present

## 2022-06-19 DIAGNOSIS — K589 Irritable bowel syndrome without diarrhea: Secondary | ICD-10-CM | POA: Diagnosis not present

## 2022-06-19 DIAGNOSIS — M519 Unspecified thoracic, thoracolumbar and lumbosacral intervertebral disc disorder: Secondary | ICD-10-CM | POA: Diagnosis not present

## 2022-06-19 DIAGNOSIS — I10 Essential (primary) hypertension: Secondary | ICD-10-CM | POA: Diagnosis not present

## 2022-06-19 DIAGNOSIS — Z9181 History of falling: Secondary | ICD-10-CM | POA: Diagnosis not present

## 2022-06-19 DIAGNOSIS — E785 Hyperlipidemia, unspecified: Secondary | ICD-10-CM | POA: Diagnosis not present

## 2022-06-19 DIAGNOSIS — M545 Low back pain, unspecified: Secondary | ICD-10-CM | POA: Diagnosis not present

## 2022-06-19 DIAGNOSIS — F32A Depression, unspecified: Secondary | ICD-10-CM | POA: Diagnosis not present

## 2022-06-19 DIAGNOSIS — M79606 Pain in leg, unspecified: Secondary | ICD-10-CM | POA: Diagnosis not present

## 2022-06-19 DIAGNOSIS — G35 Multiple sclerosis: Secondary | ICD-10-CM | POA: Diagnosis not present

## 2022-06-19 DIAGNOSIS — G4730 Sleep apnea, unspecified: Secondary | ICD-10-CM | POA: Diagnosis not present

## 2022-06-19 DIAGNOSIS — H548 Legal blindness, as defined in USA: Secondary | ICD-10-CM | POA: Diagnosis not present

## 2022-06-19 DIAGNOSIS — G8929 Other chronic pain: Secondary | ICD-10-CM | POA: Diagnosis not present

## 2022-06-19 DIAGNOSIS — Z87891 Personal history of nicotine dependence: Secondary | ICD-10-CM | POA: Diagnosis not present

## 2022-06-25 DIAGNOSIS — G4730 Sleep apnea, unspecified: Secondary | ICD-10-CM | POA: Diagnosis not present

## 2022-06-25 DIAGNOSIS — G8929 Other chronic pain: Secondary | ICD-10-CM | POA: Diagnosis not present

## 2022-06-25 DIAGNOSIS — J449 Chronic obstructive pulmonary disease, unspecified: Secondary | ICD-10-CM | POA: Diagnosis not present

## 2022-06-25 DIAGNOSIS — I10 Essential (primary) hypertension: Secondary | ICD-10-CM | POA: Diagnosis not present

## 2022-06-25 DIAGNOSIS — Z87891 Personal history of nicotine dependence: Secondary | ICD-10-CM | POA: Diagnosis not present

## 2022-06-25 DIAGNOSIS — M519 Unspecified thoracic, thoracolumbar and lumbosacral intervertebral disc disorder: Secondary | ICD-10-CM | POA: Diagnosis not present

## 2022-06-25 DIAGNOSIS — G35 Multiple sclerosis: Secondary | ICD-10-CM | POA: Diagnosis not present

## 2022-06-25 DIAGNOSIS — H548 Legal blindness, as defined in USA: Secondary | ICD-10-CM | POA: Diagnosis not present

## 2022-06-25 DIAGNOSIS — M79606 Pain in leg, unspecified: Secondary | ICD-10-CM | POA: Diagnosis not present

## 2022-06-25 DIAGNOSIS — M545 Low back pain, unspecified: Secondary | ICD-10-CM | POA: Diagnosis not present

## 2022-06-25 DIAGNOSIS — F32A Depression, unspecified: Secondary | ICD-10-CM | POA: Diagnosis not present

## 2022-06-25 DIAGNOSIS — Z9181 History of falling: Secondary | ICD-10-CM | POA: Diagnosis not present

## 2022-06-25 DIAGNOSIS — K589 Irritable bowel syndrome without diarrhea: Secondary | ICD-10-CM | POA: Diagnosis not present

## 2022-06-25 DIAGNOSIS — Z7951 Long term (current) use of inhaled steroids: Secondary | ICD-10-CM | POA: Diagnosis not present

## 2022-06-25 DIAGNOSIS — E785 Hyperlipidemia, unspecified: Secondary | ICD-10-CM | POA: Diagnosis not present

## 2022-07-01 DIAGNOSIS — G8929 Other chronic pain: Secondary | ICD-10-CM | POA: Diagnosis not present

## 2022-07-01 DIAGNOSIS — Z87891 Personal history of nicotine dependence: Secondary | ICD-10-CM | POA: Diagnosis not present

## 2022-07-01 DIAGNOSIS — K589 Irritable bowel syndrome without diarrhea: Secondary | ICD-10-CM | POA: Diagnosis not present

## 2022-07-01 DIAGNOSIS — Z7951 Long term (current) use of inhaled steroids: Secondary | ICD-10-CM | POA: Diagnosis not present

## 2022-07-01 DIAGNOSIS — J449 Chronic obstructive pulmonary disease, unspecified: Secondary | ICD-10-CM | POA: Diagnosis not present

## 2022-07-01 DIAGNOSIS — M545 Low back pain, unspecified: Secondary | ICD-10-CM | POA: Diagnosis not present

## 2022-07-01 DIAGNOSIS — F32A Depression, unspecified: Secondary | ICD-10-CM | POA: Diagnosis not present

## 2022-07-01 DIAGNOSIS — Z9181 History of falling: Secondary | ICD-10-CM | POA: Diagnosis not present

## 2022-07-01 DIAGNOSIS — G4730 Sleep apnea, unspecified: Secondary | ICD-10-CM | POA: Diagnosis not present

## 2022-07-01 DIAGNOSIS — E785 Hyperlipidemia, unspecified: Secondary | ICD-10-CM | POA: Diagnosis not present

## 2022-07-01 DIAGNOSIS — M519 Unspecified thoracic, thoracolumbar and lumbosacral intervertebral disc disorder: Secondary | ICD-10-CM | POA: Diagnosis not present

## 2022-07-01 DIAGNOSIS — I10 Essential (primary) hypertension: Secondary | ICD-10-CM | POA: Diagnosis not present

## 2022-07-01 DIAGNOSIS — G35 Multiple sclerosis: Secondary | ICD-10-CM | POA: Diagnosis not present

## 2022-07-01 DIAGNOSIS — H548 Legal blindness, as defined in USA: Secondary | ICD-10-CM | POA: Diagnosis not present

## 2022-07-01 DIAGNOSIS — M79606 Pain in leg, unspecified: Secondary | ICD-10-CM | POA: Diagnosis not present

## 2022-07-15 DIAGNOSIS — R262 Difficulty in walking, not elsewhere classified: Secondary | ICD-10-CM | POA: Diagnosis not present

## 2022-07-15 DIAGNOSIS — M545 Low back pain, unspecified: Secondary | ICD-10-CM | POA: Diagnosis not present

## 2022-07-15 DIAGNOSIS — G8929 Other chronic pain: Secondary | ICD-10-CM | POA: Diagnosis not present

## 2022-07-15 DIAGNOSIS — E038 Other specified hypothyroidism: Secondary | ICD-10-CM | POA: Diagnosis not present

## 2022-07-15 DIAGNOSIS — E559 Vitamin D deficiency, unspecified: Secondary | ICD-10-CM | POA: Diagnosis not present

## 2022-07-15 DIAGNOSIS — H539 Unspecified visual disturbance: Secondary | ICD-10-CM | POA: Diagnosis not present

## 2022-07-15 DIAGNOSIS — M79605 Pain in left leg: Secondary | ICD-10-CM | POA: Diagnosis not present

## 2022-07-15 DIAGNOSIS — G35 Multiple sclerosis: Secondary | ICD-10-CM | POA: Diagnosis not present

## 2022-07-15 DIAGNOSIS — Z9181 History of falling: Secondary | ICD-10-CM | POA: Diagnosis not present

## 2022-07-15 DIAGNOSIS — Z79899 Other long term (current) drug therapy: Secondary | ICD-10-CM | POA: Diagnosis not present

## 2022-07-15 DIAGNOSIS — E538 Deficiency of other specified B group vitamins: Secondary | ICD-10-CM | POA: Diagnosis not present

## 2022-07-15 DIAGNOSIS — R531 Weakness: Secondary | ICD-10-CM | POA: Diagnosis not present

## 2022-08-09 ENCOUNTER — Telehealth: Payer: Self-pay | Admitting: Family Medicine

## 2022-08-09 ENCOUNTER — Encounter: Payer: Self-pay | Admitting: Podiatry

## 2022-08-09 ENCOUNTER — Ambulatory Visit (INDEPENDENT_AMBULATORY_CARE_PROVIDER_SITE_OTHER): Payer: 59 | Admitting: Podiatry

## 2022-08-09 VITALS — BP 122/74 | HR 99

## 2022-08-09 DIAGNOSIS — M79675 Pain in left toe(s): Secondary | ICD-10-CM

## 2022-08-09 DIAGNOSIS — M79674 Pain in right toe(s): Secondary | ICD-10-CM | POA: Diagnosis not present

## 2022-08-09 DIAGNOSIS — B351 Tinea unguium: Secondary | ICD-10-CM

## 2022-08-09 NOTE — Telephone Encounter (Signed)
Copied from [place] [id number]. Topic: Medicare AWV  >> Aug 09, 2022 10:32 AM Devoria Glassing wrote: Reason for CRM: Left message for patient to schedule their Annual Wellness Visit (AWV) with Health Nurse Advisor Kirke Shaggy at [place].  Please call [phone] ask for [employer]

## 2022-08-09 NOTE — Progress Notes (Signed)
  Subjective:  Patient ID: [name], female    DOB: 15-Dec-1955,  MRN: [id number]  Chief Complaint  Patient presents with   Nail Problem    Nail trim   67 y.o. female returns for the above complaint.  Patient presents with thickened elongated dystrophic toenails x10.  Painful to touch.  She would like to have it debrided and she is not able to do it herself.  She is not a diabetic.  She denies any other acute complaints.  Objective:  There were no vitals filed for this visit. Podiatric Exam: Vascular: dorsalis pedis and posterior tibial pulses are palpable bilateral. Capillary return is immediate. Temperature gradient is WNL. Skin turgor WNL  Sensorium: Normal Semmes Weinstein monofilament test. Normal tactile sensation bilaterally. Nail Exam: Pt has thick disfigured discolored nails with subungual debris noted bilateral entire nail hallux through fifth toenails.  Pain on palpation to the nails. Ulcer Exam: There is no evidence of ulcer or pre-ulcerative changes or infection. Orthopedic Exam: Muscle tone and strength are WNL. No limitations in general ROM. No crepitus or effusions noted. HAV  B/L.  Hammer toes 2-5  B/L. Skin: No Porokeratosis. No infection or ulcers    Assessment & Plan:   1. Pain due to onychomycosis of toenails of both feet     Patient was evaluated and treated and all questions answered.  Onychomycosis with pain  -Nails palliatively debrided as below. -Educated on self-care  Procedure: Nail Debridement Rationale: pain  Type of Debridement: manual, sharp debridement. Instrumentation: Nail nipper, rotary burr. Number of Nails: 10  Procedures and Treatment: Consent by patient was obtained for treatment procedures. The patient understood the discussion of treatment and procedures well. All questions were answered thoroughly reviewed. Debridement of mycotic and hypertrophic toenails, 1 through 5 bilateral and clearing of subungual debris. No ulceration, no  infection noted.  Return Visit-Office Procedure: Patient instructed to return to the office for a follow up visit 3 months for continued evaluation and treatment.  Boneta Lucks, DPM    No follow-ups on file.

## 2022-09-06 ENCOUNTER — Telehealth: Payer: Self-pay | Admitting: Family Medicine

## 2022-09-06 NOTE — Telephone Encounter (Signed)
Called patient to schedule Medicare Annual Wellness Visit (AWV). Unable to reach patient.  Last date of AWV: 08/23/21  Please schedule an appointment at any time with Kirke Shaggy, LPN .  If any questions, please contact me.  Thank you ,  Sherol Dade; [place] Direct Dial: [phone]

## 2022-10-07 ENCOUNTER — Other Ambulatory Visit: Payer: Self-pay | Admitting: Family Medicine

## 2022-10-07 DIAGNOSIS — J432 Centrilobular emphysema: Secondary | ICD-10-CM

## 2022-10-08 NOTE — Telephone Encounter (Signed)
Requested Prescriptions  Pending Prescriptions Disp Refills   albuterol (VENTOLIN HFA) 108 (90 Base) MCG/ACT inhaler [Pharmacy Med Name: albuterol sulfate HFA 90 mcg/actuation aerosol inhaler] 8.5 g 5    Sig: INHALE TWO PUFFS BY MOUTH EVERY 6 HOURS AS NEEDED FOR WHEEZING and SHORTNESS OF BREATH (BULK)     Pulmonology:  Beta Agonists 2 Passed - 10/07/2022  4:44 AM      Passed - Last BP in normal range    BP Readings from Last 1 Encounters:  08/09/22 122/74         Passed - Last Heart Rate in normal range    Pulse Readings from Last 1 Encounters:  08/09/22 99         Passed - Valid encounter within last 12 months    Recent Outpatient Visits           6 months ago Multiple sclerosis, relapsing-remitting [facility])   [place] [facility] [place], Netta Neat, DO   10 months ago Chronic skin ulcer with fat layer exposed [facility])   [facility] Smitty Cords, DO   1 year ago Multiple sclerosis, relapsing-remitting [facility])   [place] [facility] Smitty Cords, DO   1 year ago Multiple sclerosis, relapsing-remitting [facility])   [place] [facility] Smitty Cords, DO   2 years ago Multiple sclerosis, relapsing-remitting [facility])    [facility] [place], Netta Neat, [place]

## 2022-10-24 ENCOUNTER — Other Ambulatory Visit: Payer: Self-pay | Admitting: Family Medicine

## 2022-10-24 DIAGNOSIS — G894 Chronic pain syndrome: Secondary | ICD-10-CM

## 2022-10-24 DIAGNOSIS — G35 Multiple sclerosis: Secondary | ICD-10-CM

## 2022-10-24 NOTE — Telephone Encounter (Signed)
Requested medication (s) are due for refill today - yes  Requested medication (s) are on the active medication list -yes  Future visit scheduled -no  Last refill: 04/26/22 #30 5RF  Notes to clinic: non delegated Rx  Requested Prescriptions  Pending Prescriptions Disp Refills   traMADol (ULTRAM) 50 MG tablet [Pharmacy Med Name: tramadol 50 mg tablet] 30 tablet 11    Sig: TAKE ONE TABLET (50 MG total) BY MOUTH AT BEDTIME AS NEEDED FOR MODERATE PAIN     Not Delegated - Analgesics:  Opioid Agonists Failed - 10/24/2022  6:17 AM      Failed - This refill cannot be delegated      Failed - Urine Drug Screen completed in last 360 days      Failed - Valid encounter within last 3 months    Recent Outpatient Visits           6 months ago Multiple sclerosis, relapsing-remitting [facility])   [place] [facility] Smitty Cords, DO   11 months ago Chronic skin ulcer with fat layer exposed [facility])   [facility] Smit[facility]ords, DO   1 year ago Multiple sclerosis, relapsing-remitting [facility])   [facility] Smitty Cords, DO   2 years ago Multiple sclerosis, relapsing-remitting [facility])   [facility] Smitty Cords, DO   2 years ago Multiple sclerosis, relapsing-remitting [facility])   [facility] Smit[facility] Cords, DO                 Requested Prescriptions  Pending Prescriptions Disp Refills   traMADol (ULTRAM) 50 MG tablet [Pharmacy Med Name: tramadol 50 mg tablet] 30 tablet 11    Sig: TAKE ONE TABLET (50 MG total) BY MOUTH AT BEDTIME AS NEEDED FOR MODERATE PAIN     Not Delegated - Analgesics:  Opioid Agonists Failed - 10/24/2022  6:17 AM      Failed - This refill cannot be delegated      Failed - Urine Drug Screen completed in last 360 days      Failed - Valid encounter within last 3 months    Recent Outpatient Visits            6 months ago Multiple sclerosis, relapsing-remitting [facility])   [place] [facility] Smitty Cords, DO   11 months ago Chronic skin ulcer with fat layer exposed [facility])   [facility] Smitty Cords, DO   1 year ago Multiple sclerosis, relapsing-remitting [facility])   [place] [facility] Smitty Cords, DO   2 years ago Multiple sclerosis, relapsing-remitting [facility])   [place] [facility] Smitty Cords, DO   2 years ago Multiple sclerosis, relapsing-remitting [facility])    [facility] [place], Netta Neat, [place]

## 2022-10-31 ENCOUNTER — Other Ambulatory Visit: Payer: Self-pay | Admitting: Family Medicine

## 2022-10-31 DIAGNOSIS — M8589 Other specified disorders of bone density and structure, multiple sites: Secondary | ICD-10-CM

## 2022-10-31 NOTE — Telephone Encounter (Signed)
Requested medication (s) are due for refill today: no  Requested medication (s) are on the active medication list: yes  Last refill:  12/17/21 #12 3 RF  Future visit scheduled: no  Notes to clinic:  overdue lab work   Requested Prescriptions  Pending Prescriptions Disp Refills   alendronate (FOSAMAX) 70 MG tablet [Pharmacy Med Name: alendronate 70 mg tablet] 12 tablet 3    Sig: TAKE 1 TABLET BY MOUTH WEEKLY ON MONDAY AT 7AM TAKE WITH 6-8 OZ WATER & REMAIN UPRIGHT FOR 30 MINUTES AFTER ADMINISTRATION     Endocrinology:  Bisphosphonates Failed - 10/31/2022  8:02 AM      Failed - Ca in normal range and within 360 days    Calcium  Date Value Ref Range Status  10/17/2020 9.2 8.6 - 10.4 mg/dL Final   Calcium, Total  Date Value Ref Range Status  11/17/2013 9.1 8.5 - 10.1 mg/dL Final         Failed - Vitamin D in normal range and within 360 days    Vit D, 25-Hydroxy  Date Value Ref Range Status  10/17/2020 21 (L) 30 - 100 ng/mL Final    Comment:    Vitamin D Status         25-OH Vitamin D: . Deficiency:                    <20 ng/mL Insufficiency:             20 - 29 ng/mL Optimal:                 > or = 30 ng/mL . For 25-OH Vitamin D testing on patients on  D2-supplementation and patients for whom quantitation  of D2 and D3 fractions is required, the QuestAssureD(TM) 25-OH VIT D, (D2,D3), LC/MS/MS is recommended: order  code 56213 (patients >93yrs). See Note 1 . Note 1 . For additional information, please refer to  http://education.QuestDiagnostics.com/faq/FAQ199  (This link is being provided for informational/ educational purposes only.)          Failed - Cr in normal range and within 360 days    Creat  Date Value Ref Range Status  10/17/2020 0.58 0.50 - 0.99 mg/dL Final    Comment:    For patients >64 years of age, the reference limit for Creatinine is approximately 13% higher for people identified as African-American. .          Failed - Mg Level in normal  range and within 360 days    No results found for: "MG"       Failed - Phosphate in normal range and within 360 days    No results found for: "PHOS"       Failed - eGFR is 30 or above and within 360 days    GFR, Est African American  Date Value Ref Range Status  10/17/2020 114 > OR = 60 mL/min/1.45m2 Final   GFR, Est Non African American  Date Value Ref Range Status  10/17/2020 98 > OR = 60 mL/min/1.45m2 Final         Failed - Bone Mineral Density or Dexa Scan completed in the last 2 years      Passed - Valid encounter within last 12 months    Recent Outpatient Visits           7 months ago Multiple sclerosis, relapsing-remitting [facility])   [place] [facility] Smitty Cords, DO   11 months ago  Chronic skin ulcer with fat layer exposed [facility])   [place] [facility] Smitty Cords, DO   1 year ago Multiple sclerosis, relapsing-remitting [facility])   [place] [facility] Smitty Cords, DO   2 years ago Multiple sclerosis, relapsing-remitting [facility])   [place] [facility] Smitty Cords, DO   2 years ago Multiple sclerosis, relapsing-remitting [facility])   [place] [facility] [place], Netta Ne[place], [place]

## 2022-11-10 ENCOUNTER — Other Ambulatory Visit: Payer: Self-pay

## 2022-11-10 ENCOUNTER — Encounter: Payer: Self-pay | Admitting: Emergency Medicine

## 2022-11-10 ENCOUNTER — Emergency Department
Admission: EM | Admit: 2022-11-10 | Discharge: 2022-11-10 | Disposition: A | Discharge status: Home / Self Care | Payer: 59 | Attending: Emergency Medicine | Admitting: Emergency Medicine

## 2022-11-10 ENCOUNTER — Emergency Department: Payer: 59

## 2022-11-10 DIAGNOSIS — M25519 Pain in unspecified shoulder: Secondary | ICD-10-CM

## 2022-11-10 DIAGNOSIS — M25511 Pain in right shoulder: Secondary | ICD-10-CM | POA: Insufficient documentation

## 2022-11-10 DIAGNOSIS — W06XXXA Fall from bed, initial encounter: Secondary | ICD-10-CM | POA: Diagnosis not present

## 2022-11-10 DIAGNOSIS — W19XXXA Unspecified fall, initial encounter: Secondary | ICD-10-CM

## 2022-11-10 MED ORDER — IBUPROFEN 800 MG PO TABS
800.0000 mg | ORAL_TABLET | Freq: Once | ORAL | Status: AC
  Administered 2022-11-10: 800 mg via ORAL
  Filled 2022-11-10: qty 1

## 2022-11-10 NOTE — ED Triage Notes (Signed)
Pt to ED via [facility] from home for a fall a few days ago. Pt states that she fell and hurt her right arm. Pt states that she has been working her arm out at home to try to work the soreness out but it is not helping. Pt is in NAD.

## 2022-11-10 NOTE — ED Provider Notes (Signed)
[facility] Emergency Department Provider Note     Event Date/Time   First MD Initiated Contact with Patient 11/10/22 1615     (approximate)   History   Fall   HPI  RAMIYAH BUSCH is a 67 y.o. female presents to the emergency department for evaluation of right shoulder pain following a fall x 2 days.  Patient reports she was trying to get out of her bed when she tripped and fell onto her right shoulder.  Pain has since gradually worsened.  Pain is described as localized and sharp only with movements.  No radiation.  Shoulder flexion makes pain worse.  Patient denies hitting head, LOC, headache, back pain.     Physical Exam   Triage Vital Signs: ED Triage Vitals [11/10/22 1416]  Enc Vitals Group     BP 132/79     Pulse Rate 73     Resp 16     Temp 97.7 F (36.5 C)     Temp Source Oral     SpO2 100 %     Weight 150 lb (68 kg)     Height 5\' 5"  (1.651 m)     Head Circumference      Peak Flow      Pain Score 5     Pain Loc      Pain Edu?      Excl. in GC?     Most recent vital signs: Vitals:   11/10/22 1416  BP: 132/79  Pulse: 73  Resp: 16  Temp: 97.7 F (36.5 C)  SpO2: 100%    General Tearful. Awake, no distress. HEENT NCAT. PERRL. EOMI.  CV:  Good peripheral perfusion.  RESP:  Normal effort. ABD:  No distention. Other:  Tenderness upon palpation over glenohumeral aspect of right shoulder.  Full active range of motion is limited due to pain.  Pain exacerbated at 90 degree flexion. No gross deformity upon inspection or palpation.   ED Results / Procedures / Treatments   Labs (all labs ordered are listed, but only abnormal results are displayed) Labs Reviewed - No data to display  RADIOLOGY  I personally viewed and evaluated these images as part of my medical decision making, as well as reviewing the written report by the radiologist.  ED Provider Interpretation: Right shoulder x-ray is unremarkable.  DG Shoulder  Right  Result Date: 11/10/2022 CLINICAL DATA:  Fall several days ago EXAM: RIGHT SHOULDER - 4 VIEW COMPARISON:  None Available. FINDINGS: There is no evidence of fracture or dislocation. There is no evidence of arthropathy or other focal bone abnormality. Soft tissues are unremarkable. IMPRESSION: No acute fracture or dislocation. Electronically Signed   By: Agustin Cree M.D.   On: 11/10/2022 17:08     PROCEDURES:  Critical Care performed: No  Procedures   MEDICATIONS ORDERED IN ED: Medications  ibuprofen (ADVIL) tablet 800 mg (800 mg Oral Given 11/10/22 1714)    IMPRESSION / MDM / ASSESSMENT AND PLAN / ED COURSE  I reviewed the triage vital signs and the nursing notes.                              Differential diagnosis includes, but is not limited to, dislocation, fracture, Impinged nerve  Patient's presentation is most consistent with acute complicated illness / injury requiring diagnostic workup.  67 year old female presents to the emergency department for evaluation and treatment of shoulder pain following  a fall 2 days ago.  See HPI for further details.  See clinical course.  Shoulder x-ray negative.  Reassuring.  Ibuprofen given for pain.  Patient's diagnosis is consistent with an acute shoulder nerve impingement. Patient will be discharged home with  instructions to take at home ibuprofen as needed for pain and follow-up outpatient care with primary care physician. Patient is given ED precautions to return to the ED for any worsening or new symptoms.  Patient verbalized understanding agreement to assessment and plan.  Patient is in satisfactory and stable condition for discharge.  Clinical Course as of 11/10/22 2207  Sun Nov 10, 2022  1625 Tearful. Fall trying to get out of bed x 2days R shoulder. Pain. No radiation. flexion makes it worse. Sharp only with movement. No head injury. No LOC.   I will order ibuprofen and right shoulder x-ray. [MH]    Clinical Course User  Index [MH] Romeo Apple, Charnel Giles A, PA-C    FINAL CLINICAL IMPRESSION(S) / ED DIAGNOSES   Final diagnoses:  Fall, initial encounter  Acute shoulder pain, unspecified laterality     Rx / DC Orders   ED Discharge Orders     None        Note:  This document was prepared using Dragon voice recognition software and may include unintentional dictation errors.    Romeo Apple, Iveliz Garay A, PA-C 11/10/22 2207    Concha Se, MD 11/13/22 [phone]

## 2022-11-10 NOTE — ED Triage Notes (Signed)
Pt in via EMS from home with c/o fall a couple of days ago and now with right arm pain. 155/81, 100% RA, 76HR. No LOC. Pt has MS

## 2022-11-28 ENCOUNTER — Other Ambulatory Visit: Payer: Self-pay | Admitting: Family Medicine

## 2022-11-28 DIAGNOSIS — R32 Unspecified urinary incontinence: Secondary | ICD-10-CM

## 2022-11-28 DIAGNOSIS — N319 Neuromuscular dysfunction of bladder, unspecified: Secondary | ICD-10-CM

## 2022-11-28 NOTE — Telephone Encounter (Signed)
Requested medication (s) are due for refill today - yes  Requested medication (s) are on the active medication list -yes  Future visit scheduled -no  Last refill: 12/17/21 #90 3RF  Notes to clinic: fails lab protocol- over 1 year 10/17/20  Requested Prescriptions  Pending Prescriptions Disp Refills   MYRBETRIQ 50 MG TB24 tablet [Pharmacy Med Name: Myrbetriq 50 mg tablet,extended release] 90 tablet 3    Sig: TAKE ONE TABLET BY MOUTH DAILY AT 9 AM     Urology: Bladder Agents - mirabegron Failed - 11/28/2022 11:40 AM      Failed - Cr in normal range and within 360 days    Creat  Date Value Ref Range Status  10/17/2020 0.58 0.50 - 0.99 mg/dL Final    Comment:    For patients >79 years of age, the reference limit for Creatinine is approximately 13% higher for people identified as African-American. .          Failed - ALT in normal range and within 360 days    ALT  Date Value Ref Range Status  10/17/2020 8 6 - 29 U/L Final   SGPT (ALT)  Date Value Ref Range Status  11/17/2013 19 12 - 78 U/L Final         Failed - AST in normal range and within 360 days    AST  Date Value Ref Range Status  10/17/2020 11 10 - 35 U/L Final   SGOT(AST)  Date Value Ref Range Status  11/17/2013 29 15 - 37 Unit/L Final         Failed - eGFR is 15 or above and within 360 days    GFR, Est African American  Date Value Ref Range Status  10/17/2020 114 > OR = 60 mL/min/1.26m2 Final   GFR, Est Non African American  Date Value Ref Range Status  10/17/2020 98 > OR = 60 mL/min/1.98m2 Final         Passed - Last BP in normal range    BP Readings from Last 1 Encounters:  11/10/22 132/79         Passed - Valid encounter within last 12 months    Recent Outpatient Visits           8 months ago Multiple sclerosis, relapsing-remitting [facility])   [place] [facility] [place], Netta Neat, DO   1 year ago Chronic skin ulcer with fat layer exposed [facility])   [place] [facility] Smitty Cords, DO   1 year ago Multiple sclerosis, relapsing-remitting [facility])   [facility] Smit[facility] Cords, DO   2 years ago Multiple sclerosis, relapsing-remitting [facility])   [place] [facility] Smit[facility] Cords, DO   2 years ago Multiple sclerosis, relapsing-remitting [facility])   [facility] Smitty Cords, DO                 Requested Prescriptions  Pending Prescriptions Disp Refills   MYRBETRIQ 50 MG TB24 tablet [Pharmacy Med Name: Myrbetriq 50 mg tablet,extended release] 90 tablet 3    Sig: TAKE ONE TABLET BY MOUTH DAILY AT 9 AM     Urology: Bladder Agents - mirabegron Failed - 11/28/2022 11:40 AM      Failed - Cr in normal range and within 360 days    Creat  Date Value Ref Range Status  10/17/2020 0.58 0.50 - 0.99 mg/dL Final    Comment:  For patients >71 years of age, the reference limit for Creatinine is approximately 13% higher for people identified as African-American. .          Failed - ALT in normal range and within 360 days    ALT  Date Value Ref Range Status  10/17/2020 8 6 - 29 U/L Final   SGPT (ALT)  Date Value Ref Range Status  11/17/2013 19 12 - 78 U/L Final         Failed - AST in normal range and within 360 days    AST  Date Value Ref Range Status  10/17/2020 11 10 - 35 U/L Final   SGOT(AST)  Date Value Ref Range Status  11/17/2013 29 15 - 37 Unit/L Final         Failed - eGFR is 15 or above and within 360 days    GFR, Est African American  Date Value Ref Range Status  10/17/2020 114 > OR = 60 mL/min/1.25m2 Final   GFR, Est Non African American  Date Value Ref Range Status  10/17/2020 98 > OR = 60 mL/min/1.51m2 Final         Passed - Last BP in normal range    BP Readings from Last 1 Encounters:  11/10/22 132/79         Passed - Valid encounter within last 12 months    Recent Outpatient Visits            8 months ago Multiple sclerosis, relapsing-remitting [facility])   [place] [facility] [place], Netta Neat, DO   1 year ago Chronic skin ulcer with fat layer exposed [facility])   [place] [facility] Smitty Cords, DO   1 year ago Multiple sclerosis, relapsing-remitting [facility])   [place] [facility] Smitty Cords, DO   2 years ago Multiple sclerosis, relapsing-remitting [facility])   [place] [facility] Smitty Cords, DO   2 years ago Multiple sclerosis, relapsing-remitting [facility])   [place] [facility] [place], Netta Neat, [place]

## 2022-12-18 ENCOUNTER — Other Ambulatory Visit: Payer: Self-pay | Admitting: Family Medicine

## 2022-12-18 DIAGNOSIS — K219 Gastro-esophageal reflux disease without esophagitis: Secondary | ICD-10-CM

## 2022-12-18 DIAGNOSIS — G35 Multiple sclerosis: Secondary | ICD-10-CM

## 2022-12-18 DIAGNOSIS — G894 Chronic pain syndrome: Secondary | ICD-10-CM

## 2022-12-19 NOTE — Telephone Encounter (Signed)
Requested medication (s) are due for refill today:   Yes for both  Requested medication (s) are on the active medication list:   Yes for both  Future visit scheduled:   No   Last ordered: 12/17/2021 for 90 days with 3 refills each.  Returned because lab is due.   Requested Prescriptions  Pending Prescriptions Disp Refills   gabapentin (NEURONTIN) 300 MG capsule [Pharmacy Med Name: gabapentin 300 mg capsule] 630 capsule 11    Sig: TAKE TWO CAPSULES BY MOUTH DAILY AT 9 AM and TAKE TWO CAPSULES BY MOUTH DAILY AT 2 PM and TAKE THREE CAPSULES BY MOUTH DAILY AT 9 PM     Neurology: Anticonvulsants - gabapentin Failed - 12/18/2022  1:20 PM      Failed - Cr in normal range and within 360 days    Creat  Date Value Ref Range Status  10/17/2020 0.58 0.50 - 0.99 mg/dL Final    Comment:    For patients >57 years of age, the reference limit for Creatinine is approximately 13% higher for people identified as African-American. .          Passed - Completed PHQ-2 or PHQ-9 in the last 360 days      Passed - Valid encounter within last 12 months    Recent Outpatient Visits           8 months ago Multiple sclerosis, relapsing-remitting [facility])   [place] [facility], Netta Neat, DO   1 year ago Chronic skin ulcer with fat layer exposed [facility])   [place] [facility] Smitty Cords, DO   1 year ago Multiple sclerosis, relapsing-remitting [facility])   [place] [facility] Smitty Cords, DO   2 years ago Multiple sclerosis, relapsing-remitting [facility])   [facility] Smitty Cords, DO   2 years ago Multiple sclerosis, relapsing-remitting [facility])   [place] [facility], Netta Neat, DO               pantoprazole (PROTONIX) 40 MG tablet [Pharmacy Med Name: pantoprazole 40 mg tablet,delayed release] 90 tablet 11    Sig: TAKE ONE TABLET BY MOUTH  DAILY AT 9 AM BEFORE BREAKFAST     Gastroenterology: Proton Pump Inhibitors Passed - 12/18/2022  1:20 PM      Passed - Valid encounter within last 12 months    Recent Outpatient Visits           8 months ago Multiple sclerosis, relapsing-remitting [facility])   [place] [facility] [place], Netta Neat, DO   1 year ago Chronic skin ulcer with fat layer exposed [facility])   [place] [facility] Smitty Cords, DO   1 year ago Multiple sclerosis, relapsing-remitting [facility])   [place] [facility] Smitty Cords, DO   2 years ago Multiple sclerosis, relapsing-remitting [facility])   [place] [facility] Smitty Cords, DO   2 years ago Multiple sclerosis, relapsing-remitting [facility])   [facility] [place], Netta Neat, [place]

## 2022-12-24 ENCOUNTER — Other Ambulatory Visit: Payer: Self-pay | Admitting: Family Medicine

## 2022-12-24 DIAGNOSIS — J432 Centrilobular emphysema: Secondary | ICD-10-CM

## 2022-12-24 DIAGNOSIS — E782 Mixed hyperlipidemia: Secondary | ICD-10-CM

## 2022-12-25 NOTE — Telephone Encounter (Signed)
Requested Prescriptions  Pending Prescriptions Disp Refills   tiotropium (SPIRIVA HANDIHALER) 18 MCG inhalation capsule [Pharmacy Med Name: Spiriva with HandiHaler 18 mcg and inhalation capsules] 90 capsule 0    Sig: INHALE THE CONTENTS OF ONE CAPSULE VIA HANDIHALER DAILY SAME TIME EVERY DAY (BULK)     Pulmonology:  Anticholinergic Agents Passed - 12/24/2022  8:33 AM      Passed - Valid encounter within last 12 months    Recent Outpatient Visits           8 months ago Multiple sclerosis, relapsing-remitting [facility])   [place] [facility] [place], Netta Neat, DO   1 year ago Chronic skin ulcer with fat layer exposed [facility])   [place] [facility] Smitty Cords, DO   1 year ago Multiple sclerosis, relapsing-remitting [facility])   [place] [facility] Smitty Cords, DO   2 years ago Multiple sclerosis, relapsing-remitting [facility])   [place] [facility] [place], Netta Neat, DO   2 years ago Multiple sclerosis, relapsing-remitting [facility])   [place] [facility] [place], Netta Neat, DO               pravastatin (PRAVACHOL) 40 MG tablet [Pharmacy Med Name: pravastatin 40 mg tablet] 90 tablet 0    Sig: TAKE ONE TABLET BY MOUTH DAILY AT 5 PM     Cardiovascular:  Antilipid - Statins Failed - 12/24/2022  8:33 AM      Failed - Lipid Panel in normal range within the last 12 months    Cholesterol, Total  Date Value Ref Range Status  01/06/2015 170 100 - 199 mg/dL Final   Cholesterol  Date Value Ref Range Status  04/01/2022 165 <200 mg/dL Final   LDL Cholesterol (Calc)  Date Value Ref Range Status  04/01/2022 86 mg/dL (calc) Final    Comment:    Reference range: <100 . Desirable range <100 mg/dL for primary prevention;   <70 mg/dL for patients with CHD or diabetic patients  with > or = 2 CHD risk factors. [name] Kitchen LDL-C is now calculated using the Martin-Hopkins   calculation, which is a validated novel method providing  better accuracy than the Friedewald equation in the  estimation of LDL-C.  Horald Pollen et al. Lenox Ahr. 1610;960(45): 2061-2068  (http://education.QuestDiagnostics.com/faq/FAQ164)    HDL  Date Value Ref Range Status  04/01/2022 65 > OR = 50 mg/dL Final  40/98/1191 52 >47 mg/dL Final    Comment:    According to ATP-III Guidelines, HDL-C >59 mg/dL is considered a negative risk factor for CHD.    Triglycerides  Date Value Ref Range Status  04/01/2022 55 <150 mg/dL Final         Passed - Patient is not pregnant      Passed - Valid encounter within last 12 months    Recent Outpatient Visits           8 months ago Multiple sclerosis, relapsing-remitting [facility])   [place] [facility] [place], Netta Neat, DO   1 year ago Chronic skin ulcer with fat layer exposed [facility])   [place] [facility] Smitty Cords, DO   1 year ago Multiple sclerosis, relapsing-remitting [facility])   [place] [facility] Smitty Cords, DO   2 years ago Multiple sclerosis, relapsing-remitting [facility])   [place] [facility] Smitty Cords, DO   2 years ago Multiple  sclerosis, relapsing-remitting [facility])   [place] [facility] [place], Netta Neat, [place]

## 2023-02-25 ENCOUNTER — Other Ambulatory Visit: Payer: Self-pay | Admitting: Family Medicine

## 2023-02-25 DIAGNOSIS — J432 Centrilobular emphysema: Secondary | ICD-10-CM

## 2023-02-25 DIAGNOSIS — E782 Mixed hyperlipidemia: Secondary | ICD-10-CM

## 2023-03-05 ENCOUNTER — Other Ambulatory Visit: Payer: Self-pay | Admitting: Family Medicine

## 2023-03-05 DIAGNOSIS — J432 Centrilobular emphysema: Secondary | ICD-10-CM

## 2023-03-25 ENCOUNTER — Other Ambulatory Visit: Payer: Self-pay | Admitting: Family Medicine

## 2023-03-25 DIAGNOSIS — G894 Chronic pain syndrome: Secondary | ICD-10-CM

## 2023-03-25 DIAGNOSIS — G35 Multiple sclerosis: Secondary | ICD-10-CM

## 2023-03-26 NOTE — Telephone Encounter (Signed)
Requested medication (s) are due for refill today:no  Requested medication (s) are on the active medication list: yes  Last refill:  10/24/22 #30  Future visit scheduled: no  Notes to clinic:  med not delegated to NT to RF   Requested Prescriptions  Pending Prescriptions Disp Refills   traMADol (ULTRAM) 50 MG tablet [Pharmacy Med Name: tramadol 50 mg tablet] 30 tablet 5    Sig: TAKE ONE TABLET (50MG  TOTAL) BY MOUTH AT BEDTIME AS NEEDED     Not Delegated - Analgesics:  Opioid Agonists Failed - 03/25/2023  5:21 PM      Failed - This refill cannot be delegated      Failed - Urine Drug Screen completed in last 360 days      Failed - Valid encounter within last 3 months    Recent Outpatient Visits           11 months ago Multiple sclerosis, relapsing-remitting [facility])   [place] [facility] [place], Netta Neat, DO   1 year ago Chronic skin ulcer with fat layer exposed [facility])   [place] [facility] Smitty Cords, DO   1 year ago Multiple sclerosis, relapsing-remitting [facility])   [place] Smit[facility] Cords, DO   2 years ago Multiple sclerosis, relapsing-remitting [facility])   [place] [facility] Smitty Cords, DO   2 years ago Multiple sclerosis, relapsing-remitting [facility])   [place] [facility] [place], Netta Neat, [place]

## 2023-03-28 ENCOUNTER — Other Ambulatory Visit: Payer: Self-pay | Admitting: Family Medicine

## 2023-03-28 DIAGNOSIS — G35 Multiple sclerosis: Secondary | ICD-10-CM

## 2023-03-28 DIAGNOSIS — G894 Chronic pain syndrome: Secondary | ICD-10-CM

## 2023-03-28 NOTE — Telephone Encounter (Signed)
Medication Refill - Medication: traMADol (ULTRAM) 50 MG tablet   Has the patient contacted their pharmacy? No.  Preferred Pharmacy (with phone number or street name):  [facility] [place], [place] - [phone] Phone: [phone]  Fax: [phone]     Has the patient been seen for an appointment in the last year OR does the patient have an upcoming appointment? Yes.    Agent: Please be advised that RX refills may take up to 3 business days. We ask that you follow-up with your pharmacy.

## 2023-03-28 NOTE — Telephone Encounter (Signed)
Requested medication (s) are due for refill today: review  Requested medication (s) are on the active medication list: yes  Last refill:  10/24/22 #30/5  Future visit scheduled: no  Notes to clinic:  not delegated, tried to call pt to schedule, # not valid.      Requested Prescriptions  Pending Prescriptions Disp Refills   traMADol (ULTRAM) 50 MG tablet 30 tablet 5    Sig: Take 1 tablet (50 mg total) by mouth at bedtime as needed.     Not Delegated - Analgesics:  Opioid Agonists Failed - 03/28/2023  5:20 PM      Failed - This refill cannot be delegated      Failed - Urine Drug Screen completed in last 360 days      Failed - Valid encounter within last 3 months    Recent Outpatient Visits           12 months ago Multiple sclerosis, relapsing-remitting [facility])   [place] [facility] [place], Netta Neat, DO   1 year ago Chronic skin ulcer with fat layer exposed [facility])   [place] [facility] Smitty Cords, DO   1 year ago Multiple sclerosis, relapsing-remitting [facility])   [facility] [facility] Smitty Cords, DO   2 years ago Multiple sclerosis, relapsing-remitting [facility])   [place] [facility] Smitty Cords, DO   2 years ago Multiple sclerosis, relapsing-remitting [facility])    [facility] [place], Netta Neat, [place]

## 2023-04-09 ENCOUNTER — Ambulatory Visit: Payer: Self-pay

## 2023-04-09 NOTE — Telephone Encounter (Signed)
  Chief Complaint: urinary frequency had transient burning last week. Symptoms: urinary frequency 5-6 times in 30 minutes Frequency: this week ? Monday  Pertinent Negatives: Patient denies fever, back pain buring Disposition: [] ED /[x] Urgent Care (no appt availability in office) / [] Appointment(In office/virtual)/ []  [place] [facility]/ [] Home Care/ [] Refused Recommended Disposition /[] [place] [facility]/ []  Follow-up with PCP Additional Notes: no appts/ advised UC Reason for Disposition  Urinating more frequently than usual (i.e., frequency)  Answer Assessment - Initial Assessment Questions 1. SYMPTOM: "What's the main symptom you're concerned about?" (e.g., frequency, incontinence)     Frequency  2. ONSET: "When did the  frequency   start?"    Last week  3. PAIN: "Is there any pain?" If Yes, ask: "How bad is it?" (Scale: 1-10; mild, moderate, severe)     none 4. CAUSE: "What do you think is causing the symptoms?"     ? UTI  5. OTHER SYMPTOMS: "Do you have any other symptoms?" (e.g., blood in urine, fever, flank pain, pain with urination)     none 6. PREGNANCY: "Is there any chance you are pregnant?" "When was your last menstrual period?"     N/a  Protocols used: Urinary Symptoms-A-AH

## 2023-04-11 ENCOUNTER — Ambulatory Visit: Payer: Self-pay | Admitting: *Deleted

## 2023-04-11 NOTE — Telephone Encounter (Signed)
     Chief Complaint: Mild burning at times, comes and goes. Son did not take pt. To UC from last triage, reports guardian. Symptoms: Above, requests morning appointment next week. Frequency: 2 weeks Pertinent Negatives: Patient denies fever Disposition: [] ED /[] Urgent Care (no appt availability in office) / [x] Appointment(In office/virtual)/ []  [place] [facility]/ [] Home Care/ [] Refused Recommended Disposition /[] [place] [facility]/ []  Follow-up with PCP Additional Notes: Pt.'s guardian agrees with appointment. Instructed to go to UC for worsening of symptoms.  Reason for Disposition  All other urine symptoms  Answer Assessment - Initial Assessment Questions 1. SYMPTOM: "What's the main symptom you're concerned about?" (e.g., frequency, incontinence)     Burning 2. ONSET: "When did the    start?"     2 weeks 3. PAIN: "Is there any pain?" If Yes, ask: "How bad is it?" (Scale: 1-10; mild, moderate, severe)     No 4. CAUSE: "What do you think is causing the symptoms?"     UTI 5. OTHER SYMPTOMS: "Do you have any other symptoms?" (e.g., blood in urine, fever, flank pain, pain with urination)     No 6. PREGNANCY: "Is there any chance you are pregnant?" "When was your last menstrual period?"     No  Protocols used: Urinary Symptoms-A-AH

## 2023-04-11 NOTE — Telephone Encounter (Signed)
Summary: possible UTI   [name] stated the patient is still having urinary frequency, a possible UTI. [name] called 04/09/23 requesting an appointment. Stated pt son lied to her and told her he had made her an appointment at a walk-in clinic but never showed up to actually take her, and now he is saying he will reschedule it.   [name] stated pt needs to be seen ASAP. No appointments.  Seeking clinical advice.      Called #[phone] to review patient sx of possible UTI. No answer, LVMTCB [phone].

## 2023-04-15 ENCOUNTER — Encounter: Payer: Self-pay | Admitting: Family Medicine

## 2023-04-15 ENCOUNTER — Ambulatory Visit (INDEPENDENT_AMBULATORY_CARE_PROVIDER_SITE_OTHER): Payer: 59 | Admitting: Family Medicine

## 2023-04-15 VITALS — BP 152/80 | HR 85 | Ht 65.0 in | Wt 152.0 lb

## 2023-04-15 DIAGNOSIS — G35 Multiple sclerosis: Secondary | ICD-10-CM | POA: Diagnosis not present

## 2023-04-15 DIAGNOSIS — R339 Retention of urine, unspecified: Secondary | ICD-10-CM

## 2023-04-15 DIAGNOSIS — N319 Neuromuscular dysfunction of bladder, unspecified: Secondary | ICD-10-CM

## 2023-04-15 DIAGNOSIS — R7309 Other abnormal glucose: Secondary | ICD-10-CM

## 2023-04-15 DIAGNOSIS — B379 Candidiasis, unspecified: Secondary | ICD-10-CM

## 2023-04-15 DIAGNOSIS — R350 Frequency of micturition: Secondary | ICD-10-CM | POA: Diagnosis not present

## 2023-04-15 DIAGNOSIS — N3000 Acute cystitis without hematuria: Secondary | ICD-10-CM

## 2023-04-15 LAB — POCT GLYCOSYLATED HEMOGLOBIN (HGB A1C): Hemoglobin A1C: 5.2 % (ref 4.0–5.6)

## 2023-04-15 MED ORDER — CIPROFLOXACIN HCL 500 MG PO TABS
500.0000 mg | ORAL_TABLET | Freq: Two times a day (BID) | ORAL | 0 refills | Status: AC
Start: 2023-04-15 — End: 2023-04-22

## 2023-04-15 MED ORDER — FLUCONAZOLE 150 MG PO TABS
ORAL_TABLET | ORAL | 1 refills | Status: DC
Start: 2023-04-15 — End: 2023-06-20

## 2023-04-15 NOTE — Patient Instructions (Addendum)
Thank you for coming to the office today.  Referral to Urologist for further evaluation of Neurogenic Bladder given the MS diagnosis  They will call to schedule  [facility] -1st floor [address] [place],  [place]  [address] Phone: [phone]  -------------------------  Possible urinary tract infection  We will go ahead and treat with an antibiotic.  A1c sugar check to evaluate if possible diabetes.   Please schedule a Follow-up Appointment to: Return if symptoms worsen or fail to improve.  If you have any other questions or concerns, please feel free to call the office or send a message through MyChart. You may also schedule an earlier appointment if necessary.  Additionally, you may be receiving a survey about your experience at our office within a few days to 1 week by e-mail or mail. We value your feedback.  [name], DO [facility], [place]

## 2023-04-15 NOTE — Progress Notes (Signed)
Subjective:    Patient ID: [name], female    DOB: 03-13-1956, 67 y.o.   MRN: [id number]  [name] is a 67 y.o. female presenting on 04/15/2023 for Urinary Frequency (X 1 week)   HPI  Here with son  Discussed the use of AI scribe software for clinical note transcription with the patient, who gave verbal consent to proceed.     The patient, with a known history of multiple sclerosis, presents with a one to two-week history of increased urinary frequency, up to seven to eight times in a short period of time. They are concerned about incomplete or inadequate emptying. She has history of Neurogenic Bladder per Neurology in the past related to her MS, and she is on Mirabegron ER 50mg   She reports no associated pain, hematuria, fever, or chills.  The patient also mentions a family history of diabetes and expresses concern about the possibility of having developed the condition, given the increased urinary frequency. The last blood sugar test was conducted over a year ago and was reported as normal.  The patient has not seen a urologist recently for these symptoms but is under the care of a neurologist for her multiple sclerosis. The patient has not reported any new medications or changes to her current regimen. She has not reported any allergies, except for penicillin.          04/15/2023   11:20 AM 04/01/2022   10:55 AM 11/20/2021    8:26 AM  Depression screen PHQ 2/9  Decreased Interest 0 0 0  Down, Depressed, Hopeless 1 0 0  PHQ - 2 Score 1 0 0  Altered sleeping 0 0 0  Tired, decreased energy 0 0 0  Change in appetite 0 0 0  Feeling bad or failure about yourself  1 0 0  Trouble concentrating 2 1 1   Moving slowly or fidgety/restless 0 0 0  Suicidal thoughts 0 0 0  PHQ-9 Score 4 1 1   Difficult doing work/chores Somewhat difficult Not difficult at all     Social History   Tobacco Use   Smoking status: Every Day    Current packs/day: 0.00    Average packs/day:  0.3 packs/day for 20.0 years (5.0 ttl pk-yrs)    Types: Cigars, Cigarettes    Start date: 01/03/1975    Last attempt to quit: 01/03/1995    Years since quitting: 28.2   Smokeless tobacco: Never   Tobacco comments:    4 cigarettes daily 10/17/2020  Vaping Use   Vaping status: Never Used  Substance Use Topics   Alcohol use: No    Alcohol/week: 0.0 standard drinks of alcohol   Drug use: Not Currently    Comment: smokes pods in past    Review of Systems Per HPI unless specifically indicated above     Objective:    BP (!) 152/80   Pulse 85   Ht 5\' 5"  (1.651 m)   Wt 152 lb (68.9 kg)   SpO2 99%   BMI 25.29 kg/m   Wt Readings from Last 3 Encounters:  04/15/23 152 lb (68.9 kg)  11/10/22 150 lb (68 kg)  04/01/22 140 lb (63.5 kg)    Physical Exam Vitals and nursing note reviewed.  Constitutional:      General: She is not in acute distress.    Appearance: Normal appearance. She is well-developed. She is not diaphoretic.     Comments: Well-appearing, comfortable, cooperative  HENT:     Head: Normocephalic  and atraumatic.  Eyes:     General:        Right eye: No discharge.        Left eye: No discharge.     Conjunctiva/sclera: Conjunctivae normal.  Cardiovascular:     Rate and Rhythm: Normal rate and regular rhythm.     Heart sounds: Normal heart sounds. No murmur heard. Pulmonary:     Effort: Pulmonary effort is normal. No respiratory distress.     Breath sounds: Normal breath sounds. No wheezing or rales.  Abdominal:     General: Bowel sounds are normal. There is no distension.     Palpations: Abdomen is soft.     Tenderness: There is no abdominal tenderness.  Musculoskeletal:     Cervical back: Normal range of motion and neck supple.     Comments: Low Back / Lower Extremity Inspection: Normal appearance, no spinal deformity, symmetrical. Palpation: No tenderness over spinous processes. Stable bilateral lower back muscle spasm and hypertonicity, seems R>L ROM: Good  intact ROM lower extremity flex/ext legs, hips Strength: Bilateral knee flex/ext 5/5. chronic residual weakness hip/knee/ankle, grip and upper ext intact 5/5 Neurovascular: intact distal sensation to light touch  Uses rollator walker  Skin:    General: Skin is warm and dry.     Findings: No erythema or rash.  Neurological:     Mental Status: She is alert and oriented to person, place, and time.  Psychiatric:        Mood and Affect: Mood normal.        Behavior: Behavior normal.        Thought Content: Thought content normal.     Comments: Well groomed, good eye contact, normal speech and thoughts      Results for orders placed or performed in visit on 04/15/23  POCT glycosylated hemoglobin (Hb A1C)  Result Value Ref Range   Hemoglobin A1C 5.2 4.0 - 5.6 %   HbA1c POC (<> result, manual entry)     HbA1c, POC (prediabetic range)     HbA1c, POC (controlled diabetic range)        Assessment & Plan:   Problem List Items Addressed This Visit     Multiple sclerosis, relapsing-remitting (HCC)   Neurogenic bladder   Other Visit Diagnoses     Urinary frequency    -  Primary   Relevant Medications   ciprofloxacin (CIPRO) 500 MG tablet   Other Relevant Orders   POCT glycosylated hemoglobin (Hb A1C) (Completed)   Incomplete bladder emptying       Abnormal glucose       Relevant Orders   POCT glycosylated hemoglobin (Hb A1C) (Completed)   Acute cystitis without hematuria       Relevant Medications   ciprofloxacin (CIPRO) 500 MG tablet   Antibiotic-induced yeast infection       Relevant Medications   fluconazole (DIFLUCAN) 150 MG tablet       Assessment and Plan    Urinary Frequency Suspected neurogenic bladder dysfunction in setting of MS  Increased urinary frequency for two weeks with no dysuria, hematuria, or systemic symptoms. Possible neurogenic bladder due to underlying MS.  -Attempted to collect urine sample for urinalysis and culture - UNABLE TO VOID  Continue  Myrbetriq 50mg   -Start empiric treatment with Ciprofloxacin 500mg  BID for 7 days for possible UTI.  -Referral to Urology for further evaluation and management. She may benefit from PVR / Urodynamic or other diagnostics.  Fam history DM Family history of diabetes and  increased urinary frequency raises concern for possible undiagnosed diabetes. -Perform fingerstick glucose test today. A1c result is 5.2%, negative or normal. This result is reassuring, no sign of sugar problems  Prophylaxis for Antibiotic-Induced Yeast Infection History of yeast infections after antibiotic use. -Provide Fluconazole 150mg  as needed for symptoms of yeast infection after antibiotic course.        Orders Placed This Encounter  Procedures   Ambulatory referral to Urology    Referral Priority:   Routine    Referral Type:   Consultation    Referral Reason:   Specialty Services Required    Requested Specialty:   Urology    Number of Visits Requested:   1   POCT glycosylated hemoglobin (Hb A1C)     Meds ordered this encounter  Medications   ciprofloxacin (CIPRO) 500 MG tablet    Sig: Take 1 tablet (500 mg total) by mouth 2 (two) times daily for 7 days.    Dispense:  14 tablet    Refill:  0   fluconazole (DIFLUCAN) 150 MG tablet    Sig: If develop yeast infection after antibiotic. Take one tablet by mouth on Day 1. Repeat dose 2nd tablet on Day 3.    Dispense:  2 tablet    Refill:  1      Follow up plan: Return if symptoms worsen or fail to improve.  [name], DO [facility] 04/15/2023, 11:38 AM

## 2023-04-23 ENCOUNTER — Other Ambulatory Visit: Payer: Self-pay | Admitting: Family Medicine

## 2023-04-23 DIAGNOSIS — G35 Multiple sclerosis: Secondary | ICD-10-CM

## 2023-04-23 DIAGNOSIS — G894 Chronic pain syndrome: Secondary | ICD-10-CM

## 2023-04-23 NOTE — Telephone Encounter (Signed)
Requested medication (s) are due for refill today: yes   Requested medication (s) are on the active medication list: yes   Last refill:  10/24/22 #30 5 refills   Future visit scheduled: no   Notes to clinic:  not delegated per protocol do you want to refill Rx?     Requested Prescriptions  Pending Prescriptions Disp Refills   traMADol (ULTRAM) 50 MG tablet [Pharmacy Med Name: tramadol 50 mg tablet] 30 tablet 5    Sig: TAKE ONE TABLET (50MG  TOTAL) BY MOUTH AT BEDTIME AS NEEDED     Not Delegated - Analgesics:  Opioid Agonists Failed - 04/23/2023  9:31 AM      Failed - This refill cannot be delegated      Failed - Urine Drug Screen completed in last 360 days      Passed - Valid encounter within last 3 months    Recent Outpatient Visits           1 week ago Urinary frequency   [place] [facility] Smitty Cords, DO   1 year ago Multiple sclerosis, relapsing-remitting [facility])   [place] [facility] Smitty Cords, DO   1 year ago Chronic skin ulcer with fat layer exposed [facility])   [place] [facility] Smitty Cords, DO   1 year ago Multiple sclerosis, relapsing-remitting [facility])   [place] [facility] Smitty C[facility], [place]   2 years ago Multiple sclerosis, relapsing-remitting [facility])   [place] [facility] [place], Netta Neat, DO       Future Appointments             In 3 weeks Stoioff, Verna Czech, MD [facility] [place]

## 2023-04-24 ENCOUNTER — Other Ambulatory Visit: Payer: Self-pay | Admitting: Neurology

## 2023-04-24 ENCOUNTER — Telehealth: Payer: Self-pay | Admitting: Family Medicine

## 2023-04-24 DIAGNOSIS — G35 Multiple sclerosis: Secondary | ICD-10-CM

## 2023-04-24 NOTE — Telephone Encounter (Signed)
Error

## 2023-04-29 ENCOUNTER — Ambulatory Visit
Admission: RE | Admit: 2023-04-29 | Discharge: 2023-04-29 | Disposition: A | Discharge status: Home / Self Care | Payer: 59 | Source: Ambulatory Visit | Attending: Neurology | Admitting: Neurology

## 2023-04-29 DIAGNOSIS — G35 Multiple sclerosis: Secondary | ICD-10-CM | POA: Insufficient documentation

## 2023-05-19 ENCOUNTER — Ambulatory Visit: Payer: 59 | Admitting: Urology

## 2023-05-29 ENCOUNTER — Other Ambulatory Visit: Payer: Self-pay | Admitting: Family Medicine

## 2023-05-29 ENCOUNTER — Ambulatory Visit: Payer: 59 | Admitting: Internal Medicine

## 2023-05-29 DIAGNOSIS — K219 Gastro-esophageal reflux disease without esophagitis: Secondary | ICD-10-CM

## 2023-05-29 NOTE — Telephone Encounter (Signed)
Requested Prescriptions  Pending Prescriptions Disp Refills   pantoprazole (PROTONIX) 40 MG tablet [Pharmacy Med Name: pantoprazole 40 mg tablet,delayed release] 90 tablet 1    Sig: TAKE ONE TABLET BY MOUTH DAILY AT 9 AM BEFORE BREAKFAST     Gastroenterology: Proton Pump Inhibitors Passed - 05/29/2023  4:48 AM      Passed - Valid encounter within last 12 months    Recent Outpatient Visits           1 month ago Urinary frequency   [place] [facility] [place], Netta Neat, DO   1 year ago Multiple sclerosis, relapsing-remitting [facility])   [place] [facility] Smitty Cords, DO   1 year ago Chronic skin ulcer with fat layer exposed [facility])   [place] [facility] Smitty Cords, DO   2 years ago Multiple sclerosis, relapsing-remitting [facility])   [place] [facility] Smitty Cords, DO   2 years ago Multiple sclerosis, relapsing-remitting [facility])   [place] [facility] Smitty Cords, DO       Future Appointments             In 1 week Baity, Salvadore Oxford, NP [place] [facility]   In 2 months MacDiarmid, Lorin Picket, MD [facility]

## 2023-06-04 ENCOUNTER — Telehealth: Payer: Self-pay | Admitting: Family Medicine

## 2023-06-04 NOTE — Telephone Encounter (Signed)
We received Fax from [employer] for this patient. They need an updated form for Personal Care Services completed, and patient is requesting additional in home support.  It looks like patient is scheduled to see Nicki Reaper, FNP on 12/17.  I would recommend that she switch her apt to me, I seem to have 1 opening on morning of 06/10/23.  I would need to talk to patient and do an evaluation and determine the reason for increasing her in home care services in order to complete this form.  Please contact patient / family and switch her to my schedule if possible.  [name], DO [facility] 06/04/2023, 6:49 PM

## 2023-06-09 ENCOUNTER — Telehealth: Payer: Self-pay

## 2023-06-09 NOTE — Telephone Encounter (Signed)
Spoke with [name] regarding patient needs.  She says patient is out of incontinence supplies.  Spoke with [employer] and they need a certifcation form signed to send new supplies.  New fax requested.

## 2023-06-09 NOTE — Telephone Encounter (Signed)
Understood! I have her form. She needs to keep apt with me on Tues 12/17 so we can see her and document and submit forms.  Office visit is required for her forms that I have!  [name], DO [facility] 06/09/2023, 12:10 PM

## 2023-06-09 NOTE — Telephone Encounter (Signed)
Copied from CRM [id number]. Topic: General - Other >> Jun 09, 2023  9:23 AM Clide Dales wrote: Patient's guardian, [name] called and stated that patient is in need of incontinence supplies. Please advise.

## 2023-06-10 ENCOUNTER — Ambulatory Visit: Payer: 59 | Admitting: Family Medicine

## 2023-06-10 ENCOUNTER — Ambulatory Visit: Payer: 59 | Admitting: Internal Medicine

## 2023-06-20 ENCOUNTER — Other Ambulatory Visit: Payer: Self-pay

## 2023-06-20 ENCOUNTER — Encounter: Payer: Self-pay | Admitting: Family Medicine

## 2023-06-20 ENCOUNTER — Ambulatory Visit (INDEPENDENT_AMBULATORY_CARE_PROVIDER_SITE_OTHER): Payer: 59 | Admitting: Family Medicine

## 2023-06-20 VITALS — BP 124/72 | HR 87 | Ht 65.0 in | Wt 152.0 lb

## 2023-06-20 DIAGNOSIS — R296 Repeated falls: Secondary | ICD-10-CM

## 2023-06-20 DIAGNOSIS — R413 Other amnesia: Secondary | ICD-10-CM

## 2023-06-20 DIAGNOSIS — G35 Multiple sclerosis: Secondary | ICD-10-CM | POA: Diagnosis not present

## 2023-06-20 DIAGNOSIS — N319 Neuromuscular dysfunction of bladder, unspecified: Secondary | ICD-10-CM

## 2023-06-20 DIAGNOSIS — Z23 Encounter for immunization: Secondary | ICD-10-CM

## 2023-06-20 DIAGNOSIS — R339 Retention of urine, unspecified: Secondary | ICD-10-CM | POA: Diagnosis not present

## 2023-06-20 NOTE — Patient Instructions (Addendum)
Thank you for coming to the office today.  [facility] [employer] [address] [place] [address]  Juan Quam Phone [phone] Fax [phone]  I will complete the order form for more Urinary Incontinence Supplies, it may be faxed out later today or Monday.  Please contact the company and give them a new shipping address for your supplies.  -----  Juanell Fairly RN - care management team with [facility]  She will contact you today and coordinate future plans regarding living situation and increasing caregiver hours.   Please schedule a Follow-up Appointment to: Return if symptoms worsen or fail to improve.  If you have any other questions or concerns, please feel free to call the office or send a message through MyChart. You may also schedule an earlier appointment if necessary.  Additionally, you may be receiving a survey about your experience at our office within a few days to 1 week by e-mail or mail. We value your feedback.  [name], DO [facility], [place]

## 2023-06-20 NOTE — Progress Notes (Signed)
Subjective:    Patient ID: [name], female    DOB: Apr 21, 1956, 67 y.o.   MRN: [id number]  [name] is a 67 y.o. female presenting on 06/20/2023 for No chief complaint on file.   HPI  Discussed the use of AI scribe software for clinical note transcription with the patient, who gave verbal consent to proceed.  History of Present Illness    [facility]  March Rummage [phone]  Marcine Matar [phone] (present with patient today)  Multiple Sclerosis Neurogenic Bladder, urinary incontinence Memory Loss Recurrent Falls  The patient, diagnosed with multiple sclerosis (MS), was brought in for a consultation to discuss her current living situation and medical supplies needed for urinary incontinence. The patient's MS has been affecting her bladder control, leading to urinary incontinence. - She is on Myrbetriq 50mg  daily Still on using pads / depends for medical supply for her urinary incontinence that is needed to help keep her dry and avoid recurrent UTI. She is doing better with hygiene and cleaning after soiling.  For MS, Followed by [facility] Neurology Dr Malvin Johns    The patient's caregiver expressed concerns about the patient's current living situation, which had recently changed from independent living in an apartment to residing with her son. Approximately 1+ month ago she changed locations, her son was planning to help provide care, but according to caregiver report, has not been able to accomplish this appropriately. Also reportedly charging her a rent fee that was higher than previous apartment.  The caregiver reported that the patient's cognitive function seemed to have declined since the move, with increased forgetfulness noted. The caregiver also reported that the patient's mobility had decreased, as the current living space did not allow for the use of a walker. The caregiver expressed concerns about the patient's safety, particularly during showering, due  to a lack of appropriate safety measures in the home.  The patient's weight has remained stable, and she reported a good appetite. However, the caregiver noted that the patient's living conditions were not ideal, with personal items left outside and a lack of organization in the home. The caregiver and another individual have been working on finding a new apartment for the patient, but this process is expected to take time.  The patient's current medication regimen and need for urinary supplies were also discussed. The patient's medications were reported to be satisfactory, and the process for ordering urinary supplies was initiated.      She has benefit from [employer] through [employer], she has needed assistance with ADLs, but has demonstrated in past ability to do some meal prep and basic tasks, but has increasing need for help now with ADLs lately worsening falls and confusion at times since moving in with son.  They are requesting for increased hours and support for her and change of living situation   Chronic Left leg drop, she has worked on this with PT. She uses rolling walker. She is able to ambulate using this, she does not drive, transportation brought her to apt today   She utilizes a walker for ambulation assistance at home and out of house. She uses Rollator Walker   Concern with some gradual cognitive decline still in setting of MS. Increasing memory loss at times with some confusion. No recent flare with MS   Regarding MS medications Continues on medication management.   - For pain and function she takes the following: - Taking Tramadol 50mg  nightly PRN pain - Taking  Gabapentin 300mg  x 2 in AM, 2 in afternoon, and 3 PM - with improvement - Taking Cyclobenzaprine 10mg  QHS PRN - OFF Baclofen    Admits chronic L sided weakness, some episodes of intermittent swelling seem related to inactivity, improve with rest and elevation  Additional PMH -  Centrilobular Emphysema - GERD   Health Maintenance:   Due for Flu Shot, will receive today        06/20/2023    9:17 AM 04/15/2023   11:20 AM 04/01/2022   10:55 AM  Depression screen PHQ 2/9  Decreased Interest 1 0 0  Down, Depressed, Hopeless 1 1 0  PHQ - 2 Score 2 1 0  Altered sleeping 0 0 0  Tired, decreased energy 1 0 0  Change in appetite 0 0 0  Feeling bad or failure about yourself  0 1 0  Trouble concentrating 0 2 1  Moving slowly or fidgety/restless 1 0 0  Suicidal thoughts 0 0 0  PHQ-9 Score 4 4 1   Difficult doing work/chores  Somewhat difficult Not difficult at all       06/20/2023    9:18 AM 04/15/2023   11:20 AM 04/01/2022   10:55 AM 11/20/2021    8:26 AM  GAD 7 : Generalized Anxiety Score  Nervous, Anxious, on Edge 0 0 0 0  Control/stop worrying 2 1 0 0  Worry too much - different things 2 1 0 0  Trouble relaxing 2 0 0 0  Restless 0 0 0 0  Easily annoyed or irritable 2 0 0 0  Afraid - awful might happen 0 0 0 0  Total GAD 7 Score 8 2 0 0  Anxiety Difficulty   Not difficult at all Not difficult at all    Social History   Tobacco Use   Smoking status: Every Day    Current packs/day: 0.00    Average packs/day: 0.3 packs/day for 20.0 years (5.0 ttl pk-yrs)    Types: Cigars, Cigarettes    Start date: 01/03/1975    Last attempt to quit: 01/03/1995    Years since quitting: 28.4   Smokeless tobacco: Never   Tobacco comments:    4 cigarettes daily 10/17/2020  Vaping Use   Vaping status: Never Used  Substance Use Topics   Alcohol use: No    Alcohol/week: 0.0 standard drinks of alcohol   Drug use: Not Currently    Comment: smokes pods in past    Review of Systems Per HPI unless specifically indicated above     Objective:    BP 124/72   Pulse 87   Ht 5\' 5"  (1.651 m)   Wt 152 lb (68.9 kg)   SpO2 96%   BMI 25.29 kg/m   Wt Readings from Last 3 Encounters:  06/20/23 152 lb (68.9 kg)  04/15/23 152 lb (68.9 kg)  11/10/22 150 lb (68 kg)     Physical Exam Vitals and nursing note reviewed.  Constitutional:      General: She is not in acute distress.    Appearance: Normal appearance. She is well-developed. She is not diaphoretic.     Comments: Well-appearing, comfortable, cooperative  HENT:     Head: Normocephalic and atraumatic.  Eyes:     General:        Right eye: No discharge.        Left eye: No discharge.     Conjunctiva/sclera: Conjunctivae normal.  Cardiovascular:     Rate and Rhythm: Normal rate and regular rhythm.  Heart sounds: Normal heart sounds. No murmur heard. Pulmonary:     Effort: Pulmonary effort is normal. No respiratory distress.     Breath sounds: Normal breath sounds. No wheezing or rales.  Abdominal:     General: Bowel sounds are normal. There is no distension.     Palpations: Abdomen is soft.     Tenderness: There is no abdominal tenderness.  Musculoskeletal:     Cervical back: Normal range of motion and neck supple.     Comments: Low Back / Lower Extremity Inspection: Normal appearance, no spinal deformity, symmetrical. Palpation: No tenderness over spinous processes. Stable bilateral lower back muscle spasm and hypertonicity, seems R>L ROM: Good intact ROM lower extremity flex/ext legs, hips Strength: Bilateral knee flex/ext 5/5. chronic residual weakness hip/knee/ankle, grip and upper ext intact 5/5 Neurovascular: intact distal sensation to light touch  Uses rollator walker  Skin:    General: Skin is warm and dry.     Findings: No erythema or rash.  Neurological:     Mental Status: She is alert and oriented to person, place, and time.  Psychiatric:        Mood and Affect: Mood normal.        Behavior: Behavior normal.        Thought Content: Thought content normal.     Comments: Well groomed, good eye contact, normal speech and thoughts     Results for orders placed or performed in visit on 04/15/23  POCT glycosylated hemoglobin (Hb A1C)   Collection Time: 04/15/23 11:54 AM   Result Value Ref Range   Hemoglobin A1C 5.2 4.0 - 5.6 %   HbA1c POC (<> result, manual entry)     HbA1c, POC (prediabetic range)     HbA1c, POC (controlled diabetic range)        Assessment & Plan:   Problem List Items Addressed This Visit     Multiple sclerosis, relapsing-remitting (HCC) - Primary   Neurogenic bladder   Other Visit Diagnoses       Flu vaccine need       Relevant Orders   Flu Vaccine Trivalent High Dose (Fluad) (Completed)     Incomplete bladder emptying         Recurrent falls         Memory loss            Assessment and Plan    Multiple Sclerosis with Urinary Incontinence Discussed the impact of MS on bladder control leading to incontinence. Followed by Dr Malvin Johns Neurology  -Order urinary supplies for patient. Will complete order form today and it will fax out today or Monday. Patient's caregivers will call the [employer] company to change her delivery address if needed - Indicated for incontinence supplies, pull-ups, continue to use regularly to prevent future UTI risk that would come with soiling. Continue Myrbetriq from 50 XL daily - max dose goal to reduce frequency  Living Situation Current living situation with son is not ideal and potentially unsafe. Noted concerns about patient's cognitive decline, decreased mobility, and inadequate living conditions.  -Initiate referral to nurse case manager, Gae Dry RN, for assessment and potential report to Adult Protective Services (APS) today, she will contact patient's caregivers.  -Consider relocation to a more suitable environment, potentially an assisted living facility or similar apartment living.   Flu Shot today     She will benefit from [employer] increased hours and additional caregiver support for ADLs, I discussed this paperwork with the  company today, caregivers and they advise that first need to sort out living situation before ordering more hours. They will  agree to pause this paperwork request, and we will follow with Case Management / LCSW to work with DSS / APS for next evaluation if indicated and consider living arrangement change.    Orders Placed This Encounter  Procedures   Flu Vaccine Trivalent High Dose (Fluad)    No orders of the defined types were placed in this encounter.   Follow up plan: Return if symptoms worsen or fail to improve.   [name], DO [facility] 06/20/2023, 9:28 AM

## 2023-06-30 ENCOUNTER — Telehealth: Payer: Self-pay

## 2023-06-30 NOTE — Patient Outreach (Signed)
  Care Management   Outreach Note  06/30/2023 Name: [name] MRN: [id number] DOB: Mar 13, 1956  An unsuccessful telephone outreach was attempted today to contact the patient about Care Management needs.     Follow Up Plan:  A HIPAA compliant phone message was left for the patient providing contact information and requesting a return call.    [facility]  [facility] RN Care Manager Direct Dial: [phone] Website: [email or web address]

## 2023-07-01 ENCOUNTER — Other Ambulatory Visit: Payer: Self-pay | Admitting: Family Medicine

## 2023-07-01 DIAGNOSIS — R32 Unspecified urinary incontinence: Secondary | ICD-10-CM

## 2023-07-01 DIAGNOSIS — N319 Neuromuscular dysfunction of bladder, unspecified: Secondary | ICD-10-CM

## 2023-07-02 ENCOUNTER — Other Ambulatory Visit: Payer: Self-pay | Admitting: Family Medicine

## 2023-07-02 DIAGNOSIS — R32 Unspecified urinary incontinence: Secondary | ICD-10-CM

## 2023-07-02 DIAGNOSIS — N319 Neuromuscular dysfunction of bladder, unspecified: Secondary | ICD-10-CM

## 2023-07-02 NOTE — Telephone Encounter (Signed)
 Medication Refill -  Most Recent Primary Care Visit:  Provider: EDMAN MARSA PARAS  Department: [facility]  Visit Type: FOLLOW UP 20  Date: 06/20/2023  Medication: mirabegron  ER (MYRBETRIQ ) 50 MG TB24 tablet   Has the patient contacted their pharmacy? Yes   Is this the correct pharmacy for this prescription? Yes   This is the patient's preferred pharmacy  [facility] - [place], [place] - [address] [place] [address] Phone: [phone] Fax: [phone]  Has the prescription been filled recently? Yes  Is the patient out of the medication? Yes  Has the patient been seen for an appointment in the last year OR does the patient have an upcoming appointment? Yes  Can we respond through MyChart? No  Agent: Please be advised that Rx refills may take up to 3 business days. We ask that you follow-up with your pharmacy.

## 2023-07-03 NOTE — Telephone Encounter (Signed)
 Requested medication (s) are due for refill today: Yes  Requested medication (s) are on the active medication list: Yes  Last refill:  11/28/22  Future visit scheduled: No  Notes to clinic:  Unable to refill per protocol due to failed labs, no updated results.      Requested Prescriptions  Pending Prescriptions Disp Refills   MYRBETRIQ  50 MG TB24 tablet [Pharmacy Med Name: Myrbetriq  50 mg tablet,extended release] 90 tablet 11    Sig: TAKE ONE TABLET BY MOUTH DAILY AT 9 AM     Urology: Bladder Agents - mirabegron  Failed - 07/03/2023  3:46 PM      Failed - Cr in normal range and within 360 days    Creat  Date Value Ref Range Status  10/17/2020 0.58 0.50 - 0.99 mg/dL Final    Comment:    For patients >31 years of age, the reference limit for Creatinine is approximately 13% higher for people identified as African-American. .          Failed - ALT in normal range and within 360 days    ALT  Date Value Ref Range Status  10/17/2020 8 6 - 29 U/L Final   SGPT (ALT)  Date Value Ref Range Status  11/17/2013 19 12 - 78 U/L Final         Failed - AST in normal range and within 360 days    AST  Date Value Ref Range Status  10/17/2020 11 10 - 35 U/L Final   SGOT(AST)  Date Value Ref Range Status  11/17/2013 29 15 - 37 Unit/L Final         Failed - eGFR is 15 or above and within 360 days    GFR, Est African American  Date Value Ref Range Status  10/17/2020 114 > OR = 60 mL/min/1.7m2 Final   GFR, Est Non African American  Date Value Ref Range Status  10/17/2020 98 > OR = 60 mL/min/1.70m2 Final         Passed - Last BP in normal range    BP Readings from Last 1 Encounters:  06/20/23 124/72         Passed - Valid encounter within last 12 months    Recent Outpatient Visits           1 week ago Multiple sclerosis, relapsing-remitting [facility])   [place] [facility] [place], Marsa PARAS, DO   2 months ago Urinary frequency   [place] [facility] [place] Marsa PARAS, DO   1 year ago Multiple sclerosis, relapsing-remitting [facility])   [place] [facility] [place] Marsa PARAS, DO   1 year ago Chronic skin ulcer with fat layer exposed [facility])   [place] [facility] [place] Marsa PARAS, DO   2 years ago Multiple sclerosis, relapsing-remitting [facility])   [place] [facility] [place], Marsa PARAS, DO       Future Appointments             In 3 weeks MacDiarmid, [place], MD [facility] Urology [facility]

## 2023-07-07 NOTE — Telephone Encounter (Signed)
 Unable to refill per protocol, Rx is a duplicate request. Last refill 07/03/23.  Requested Prescriptions  Pending Prescriptions Disp Refills   mirabegron  ER (MYRBETRIQ ) 50 MG TB24 tablet 90 tablet 1     Urology: Bladder Agents - mirabegron  Failed - 07/07/2023  8:57 AM      Failed - Cr in normal range and within 360 days    Creat  Date Value Ref Range Status  10/17/2020 0.58 0.50 - 0.99 mg/dL Final    Comment:    For patients >67 years of age, the reference limit for Creatinine is approximately 13% higher for people identified as African-American. .          Failed - ALT in normal range and within 360 days    ALT  Date Value Ref Range Status  10/17/2020 8 6 - 29 U/L Final   SGPT (ALT)  Date Value Ref Range Status  11/17/2013 19 12 - 78 U/L Final         Failed - AST in normal range and within 360 days    AST  Date Value Ref Range Status  10/17/2020 11 10 - 35 U/L Final   SGOT(AST)  Date Value Ref Range Status  11/17/2013 29 15 - 37 Unit/L Final         Failed - eGFR is 15 or above and within 360 days    GFR, Est African American  Date Value Ref Range Status  10/17/2020 114 > OR = 60 mL/min/1.7m2 Final   GFR, Est Non African American  Date Value Ref Range Status  10/17/2020 98 > OR = 60 mL/min/1.25m2 Final         Passed - Last BP in normal range    BP Readings from Last 1 Encounters:  06/20/23 124/72         Passed - Valid encounter within last 12 months    Recent Outpatient Visits           2 weeks ago Multiple sclerosis, relapsing-remitting [facility])   [place] [facility] [place], Marsa PARAS, DO   2 months ago Urinary frequency   [place] [facility] [place] Marsa PARAS, DO   1 year ago Multiple sclerosis, relapsing-remitting [facility])   [place] [facility] [place] Marsa PARAS, DO   1 year ago Chronic skin ulcer with fat layer exposed [facility])   [place] [facility]  [place] Marsa PARAS, DO   2 years ago Multiple sclerosis, relapsing-remitting [facility])   [place] [facility] [place], Marsa PARAS, DO       Future Appointments             In 3 weeks MacDiarmid, Glendia, MD [facility]rology [facility]

## 2023-07-11 ENCOUNTER — Other Ambulatory Visit: Payer: Self-pay | Admitting: Family Medicine

## 2023-07-11 DIAGNOSIS — M8589 Other specified disorders of bone density and structure, multiple sites: Secondary | ICD-10-CM

## 2023-07-11 NOTE — Telephone Encounter (Signed)
Requested Prescriptions  Refused Prescriptions Disp Refills   alendronate (FOSAMAX) 70 MG tablet [Pharmacy Med Name: alendronate 70 mg tablet] 12 tablet 3    Sig: TAKE 1 TABLET BY MOUTH ONCE WEEKLY ON MONDAY AT 7AM TAKE WITH 6-8 OZ OF WATER & REMAIN UPRIGHT FOR 30 MINUTES AFTER ADMINISTRATION     Endocrinology:  Bisphosphonates Failed - 07/11/2023 11:39 AM      Failed - Ca in normal range and within 360 days    Calcium  Date Value Ref Range Status  10/17/2020 9.2 8.6 - 10.4 mg/dL Final   Calcium, Total  Date Value Ref Range Status  11/17/2013 9.1 8.5 - 10.1 mg/dL Final         Failed - Vitamin D in normal range and within 360 days    Vit D, 25-Hydroxy  Date Value Ref Range Status  10/17/2020 21 (L) 30 - 100 ng/mL Final    Comment:    Vitamin D Status         25-OH Vitamin D: . Deficiency:                    <20 ng/mL Insufficiency:             20 - 29 ng/mL Optimal:                 > or = 30 ng/mL . For 25-OH Vitamin D testing on patients on  D2-supplementation and patients for whom quantitation  of D2 and D3 fractions is required, the QuestAssureD(TM) 25-OH VIT D, (D2,D3), LC/MS/MS is recommended: order  code 16109 (patients >62yrs). See Note 1 . Note 1 . For additional information, please refer to  http://education.QuestDiagnostics.com/faq/FAQ199  (This link is being provided for informational/ educational purposes only.)          Failed - Cr in normal range and within 360 days    Creat  Date Value Ref Range Status  10/17/2020 0.58 0.50 - 0.99 mg/dL Final    Comment:    For patients >48 years of age, the reference limit for Creatinine is approximately 13% higher for people identified as African-American. .          Failed - Mg Level in normal range and within 360 days    No results found for: "MG"       Failed - Phosphate in normal range and within 360 days    No results found for: "PHOS"       Failed - eGFR is 30 or above and within 360 days    GFR, Est  African American  Date Value Ref Range Status  10/17/2020 114 > OR = 60 mL/min/1.73m2 Final   GFR, Est Non African American  Date Value Ref Range Status  10/17/2020 98 > OR = 60 mL/min/1.62m2 Final         Failed - Bone Mineral Density or Dexa Scan completed in the last 2 years      Passed - Valid encounter within last 12 months    Recent Outpatient Visits           3 weeks ago Multiple sclerosis, relapsing-remitting [facility])   [place] [facility] Smitty Cords, DO   2 months ago Urinary frequency   [place] [facility] Smitty Cords, DO   1 year ago Multiple sclerosis, relapsing-remitting [facility])   [place] [facility] Smitty Cords, DO   1 year ago Chronic skin  ulcer with fat layer exposed [facility])   [place] [facility] Smitty Cords, DO   2 years ago Multiple sclerosis, relapsing-remitting [facility])   [place] [facility] Al[place]a Charon, Netta Neat, DO       Future Appointments             In 2 weeks MacDiarmid, Lorin Picket, MD [facility]

## 2023-07-17 ENCOUNTER — Other Ambulatory Visit: Payer: Self-pay

## 2023-07-21 NOTE — Patient Outreach (Addendum)
  Care Management   Visit Note   Name: [name] MRN: [id number] DOB: Jul 09, 1955  Subjective: [name] is a 68 y.o. year old female who is a primary care patient of Smitty Cords, DO.    Unable to reach patient at listed numbers. No answer with son.  Attempted to reach patient at [phone]. Call answered by previous caregiver [name] who notes she is no longer working with [name].   PLAN The care team will continue outreach attempts.   Juanell Fairly [facility] RN Care Manager Direct Dial: [phone]  Fax: [phone] Website: [email or web address]

## 2023-07-24 ENCOUNTER — Other Ambulatory Visit: Payer: Self-pay | Admitting: Family Medicine

## 2023-07-24 DIAGNOSIS — G35 Multiple sclerosis: Secondary | ICD-10-CM

## 2023-07-24 DIAGNOSIS — G894 Chronic pain syndrome: Secondary | ICD-10-CM

## 2023-07-25 NOTE — Telephone Encounter (Signed)
Requested medications are due for refill today.  yes  Requested medications are on the active medications list.  yes  Last refill. 12/19/2022 #630 1 rf  Future visit scheduled.   no  Notes to clinic.  Expired labs.    Requested Prescriptions  Pending Prescriptions Disp Refills   gabapentin (NEURONTIN) 300 MG capsule [Pharmacy Med Name: gabapentin 300 mg capsule] 630 capsule 4    Sig: TAKE TWO CAPSULES BY MOUTH DAILY AT 9 AM and TAKE TWO CAPSULES BY MOUTH DAILY AT 2 PM and TAKE THREE CAPSULES BY MOUTH DAILY AT 9 PM     Neurology: Anticonvulsants - gabapentin Failed - 07/25/2023 12:15 PM      Failed - Cr in normal range and within 360 days    Creat  Date Value Ref Range Status  10/17/2020 0.58 0.50 - 0.99 mg/dL Final    Comment:    For patients >54 years of age, the reference limit for Creatinine is approximately 13% higher for people identified as African-American. Verna Czech - Completed PHQ-2 or PHQ-9 in the last 360 days      Passed - Valid encounter within last 12 months    Recent Outpatient Visits           1 month ago Multiple sclerosis, relapsing-remitting [facility])   [place] [facility] [place], Netta Neat, DO   3 months ago Urinary frequency   [place] [facility] Smitty Cords, DO   1 year ago Multiple sclerosis, relapsing-remitting [facility])   [place] [facility] Smitty Cords, DO   1 year ago Chronic skin ulcer with fat layer exposed [facility])   [place] [facility] Smitty Cords, DO   2 years ago Multiple sclerosis, relapsing-remitting [facility])   [place] [facility] Smitty Cords, DO       Future Appointments             In 3 days MacDiarmid, Lorin Picket, MD [facility]

## 2023-07-28 ENCOUNTER — Ambulatory Visit: Payer: Self-pay | Admitting: Urology

## 2023-07-29 ENCOUNTER — Encounter: Payer: Self-pay | Admitting: Urology

## 2023-08-01 ENCOUNTER — Other Ambulatory Visit: Payer: Self-pay | Admitting: Family Medicine

## 2023-08-01 DIAGNOSIS — M8589 Other specified disorders of bone density and structure, multiple sites: Secondary | ICD-10-CM

## 2023-08-01 NOTE — Telephone Encounter (Signed)
 Requested medications are due for refill today.  no  Requested medications are on the active medications list.  yes  Last refill. 10/31/2022 12/3 rf  Future visit scheduled.   no  Notes to clinic.  Pt is over due for labs.    Requested Prescriptions  Pending Prescriptions Disp Refills   alendronate  (FOSAMAX ) 70 MG tablet [Pharmacy Med Name: alendronate  70 mg tablet] 12 tablet 3    Sig: TAKE 1 TABLET BY MOUTH ONCE WEEKLY ON MONDAY AT 7AM TAKE WITH 6-8 OZ OF WATER & REMAIN UPRIGHT FOR 30 MINUTES AFTER ADMINISTRATION     Endocrinology:  Bisphosphonates Failed - 08/01/2023  3:17 PM      Failed - Ca in normal range and within 360 days    Calcium  Date Value Ref Range Status  10/17/2020 9.2 8.6 - 10.4 mg/dL Final   Calcium, Total  Date Value Ref Range Status  11/17/2013 9.1 8.5 - 10.1 mg/dL Final         Failed - Vitamin D  in normal range and within 360 days    Vit D, 25-Hydroxy  Date Value Ref Range Status  10/17/2020 21 (L) 30 - 100 ng/mL Final    Comment:    Vitamin D  Status         25-OH Vitamin D : . Deficiency:                    <20 ng/mL Insufficiency:             20 - 29 ng/mL Optimal:                 > or = 30 ng/mL . For 25-OH Vitamin D  testing on patients on  D2-supplementation and patients for whom quantitation  of D2 and D3 fractions is required, the QuestAssureD(TM) 25-OH VIT D, (D2,D3), LC/MS/MS is recommended: order  code 07111 (patients >75yrs). See Note 1 . Note 1 . For additional information, please refer to  http://education.QuestDiagnostics.com/faq/FAQ199  (This link is being provided for informational/ educational purposes only.)          Failed - Cr in normal range and within 360 days    Creat  Date Value Ref Range Status  10/17/2020 0.58 0.50 - 0.99 mg/dL Final    Comment:    For patients >85 years of age, the reference limit for Creatinine is approximately 13% higher for people identified as African-American. .          Failed - Mg  Level in normal range and within 360 days    No results found for: MG       Failed - Phosphate in normal range and within 360 days    No results found for: PHOS       Failed - eGFR is 30 or above and within 360 days    GFR, Est African American  Date Value Ref Range Status  10/17/2020 114 > OR = 60 mL/min/1.18m2 Final   GFR, Est Non African American  Date Value Ref Range Status  10/17/2020 98 > OR = 60 mL/min/1.41m2 Final         Failed - Bone Mineral Density or Dexa Scan completed in the last 2 years      Passed - Valid encounter within last 12 months    Recent Outpatient Visits           1 month ago Multiple sclerosis, relapsing-remitting [facility])   [place] [facility] [place], Marsa PARAS,  DO   3 months ago Urinary frequency   [place] [facility] [place] Marsa PARAS, DO   1 year ago Multiple sclerosis, relapsing-remitting [facility])   [place] [facility] [place] Marsa PARAS, DO   1 year ago Chronic skin ulcer with fat layer exposed [facility])   [place] [facility] [place] Marsa PARAS, DO   2 years ago Multiple sclerosis, relapsing-remitting [facility])   [place] [facility] [place], Marsa PARAS, [place]

## 2023-08-02 ENCOUNTER — Other Ambulatory Visit: Payer: Self-pay | Admitting: Family Medicine

## 2023-08-02 DIAGNOSIS — M8589 Other specified disorders of bone density and structure, multiple sites: Secondary | ICD-10-CM

## 2023-08-04 NOTE — Telephone Encounter (Signed)
 She is required to do new Bone Mineral Density DEXA scan before we can refill Alendronate . Usually alendronate  is used for 5 years at a time then repeat DEXA scan and re-assess. She needs new imaging DEXA order is in. After scan we can reconsider Alendronate .  Domingo Friend, DO [facility] 08/04/2023, 5:42 PM

## 2023-08-04 NOTE — Telephone Encounter (Signed)
 Requested medication (s) are due for refill today: Yes  Requested medication (s) are on the active medication list: Yes  Last refill:  10/31/22  Future visit scheduled: No  Notes to clinic:  Pt. Seen 06/17/23.    Requested Prescriptions  Pending Prescriptions Disp Refills   alendronate  (FOSAMAX ) 70 MG tablet [Pharmacy Med Name: alendronate  70 mg tablet] 12 tablet 3    Sig: TAKE 1 TABLET BY MOUTH ONCE WEEKLY ON MONDAY AT 7AM TAKE WITH 6-8 OZ OF WATER & REMAIN UPRIGHT FOR 30 MINUTES AFTER ADMINISTRATION     Endocrinology:  Bisphosphonates Failed - 08/04/2023 11:44 AM      Failed - Ca in normal range and within 360 days    Calcium  Date Value Ref Range Status  10/17/2020 9.2 8.6 - 10.4 mg/dL Final   Calcium, Total  Date Value Ref Range Status  11/17/2013 9.1 8.5 - 10.1 mg/dL Final         Failed - Vitamin D  in normal range and within 360 days    Vit D, 25-Hydroxy  Date Value Ref Range Status  10/17/2020 21 (L) 30 - 100 ng/mL Final    Comment:    Vitamin D  Status         25-OH Vitamin D : . Deficiency:                    <20 ng/mL Insufficiency:             20 - 29 ng/mL Optimal:                 > or = 30 ng/mL . For 25-OH Vitamin D  testing on patients on  D2-supplementation and patients for whom quantitation  of D2 and D3 fractions is required, the QuestAssureD(TM) 25-OH VIT D, (D2,D3), LC/MS/MS is recommended: order  code 40981 (patients >71yrs). See Note 1 . Note 1 . For additional information, please refer to  http://education.QuestDiagn[facility].com/faq/FAQ199  (This link is being provided for informational/ educational purposes only.)          Failed - Cr in normal range and within 360 days    Creat  Date Value Ref Range Status  10/17/2020 0.58 0.50 - 0.99 mg/dL Final    Comment:    For patients >28 years of age, the reference limit for Creatinine is approximately 13% higher for people identified as African-American. .          Failed - Mg Level in normal  range and within 360 days    No results found for: "MG"       Failed - Phosphate in normal range and within 360 days    No results found for: "PHOS"       Failed - eGFR is 30 or above and within 360 days    GFR, Est African American  Date Value Ref Range Status  10/17/2020 114 > OR = 60 mL/min/1.53m2 Final   GFR, Est Non African American  Date Value Ref Range Status  10/17/2020 98 > OR = 60 mL/min/1.71m2 Final         Failed - Bone Mineral Density or Dexa Scan completed in the last 2 years      Passed - Valid encounter within last 12 months    Recent Outpatient Visits           1 month ago Multiple sclerosis, relapsing-remitting [facility])   [place] [place], Kayleen Party, DO   3 months ago Urinary  frequency   [place] [facility] Raina Bunting, DO   1 year ago Multiple sclerosis, relapsing-remitting [facility])   [place] [facility] Raina Bunting, DO   1 year ago Chronic skin ulcer with fat layer exposed [facility])   [place] [facility] Raina Bunting, DO   2 years ago Multiple sclerosis, relapsing-remitting [facility])    [facility] [place], Kayleen Party, [place]

## 2023-08-05 ENCOUNTER — Other Ambulatory Visit: Payer: Self-pay | Admitting: Family Medicine

## 2023-08-05 DIAGNOSIS — M85859 Other specified disorders of bone density and structure, unspecified thigh: Secondary | ICD-10-CM

## 2023-08-05 NOTE — Telephone Encounter (Signed)
Left message for patient to return call OK for [facility] to advise

## 2023-08-05 NOTE — Telephone Encounter (Signed)
Order is in already. The imaging center may contact her to schedule. But if needed she can try to call and schedule as well.  For DEXA Scan (Bone mineral density)  Call the Imaging Center below anytime to schedule your own appointment now that order has been placed.  [facility] [address] [place], [place] [address] Phone: [phone]  [name], DO [facility] 08/05/2023, 1:11 PM

## 2023-08-09 ENCOUNTER — Emergency Department
Admission: EM | Admit: 2023-08-09 | Discharge: 2023-08-09 | Disposition: A | Discharge status: Home / Self Care | Payer: 59 | Attending: Emergency Medicine | Admitting: Emergency Medicine

## 2023-08-09 ENCOUNTER — Emergency Department: Payer: 59

## 2023-08-09 ENCOUNTER — Other Ambulatory Visit: Payer: Self-pay

## 2023-08-09 DIAGNOSIS — F0390 Unspecified dementia without behavioral disturbance: Secondary | ICD-10-CM | POA: Diagnosis not present

## 2023-08-09 DIAGNOSIS — W1811XA Fall from or off toilet without subsequent striking against object, initial encounter: Secondary | ICD-10-CM | POA: Insufficient documentation

## 2023-08-09 DIAGNOSIS — M545 Low back pain, unspecified: Secondary | ICD-10-CM | POA: Diagnosis not present

## 2023-08-09 DIAGNOSIS — U071 COVID-19: Secondary | ICD-10-CM | POA: Insufficient documentation

## 2023-08-09 DIAGNOSIS — R059 Cough, unspecified: Secondary | ICD-10-CM | POA: Diagnosis present

## 2023-08-09 HISTORY — DX: Unspecified dementia, unspecified severity, without behavioral disturbance, psychotic disturbance, mood disturbance, and anxiety: F03.90

## 2023-08-09 LAB — URINALYSIS, ROUTINE W REFLEX MICROSCOPIC
Bacteria, UA: NONE SEEN
Bilirubin Urine: NEGATIVE
Glucose, UA: NEGATIVE mg/dL
Ketones, ur: NEGATIVE mg/dL
Leukocytes,Ua: NEGATIVE
Nitrite: NEGATIVE
Protein, ur: NEGATIVE mg/dL
Specific Gravity, Urine: 1.005 (ref 1.005–1.030)
pH: 6 (ref 5.0–8.0)

## 2023-08-09 LAB — RESP PANEL BY RT-PCR (RSV, FLU A&B, COVID)  RVPGX2
Influenza A by PCR: NEGATIVE
Influenza B by PCR: NEGATIVE
Resp Syncytial Virus by PCR: NEGATIVE
SARS Coronavirus 2 by RT PCR: POSITIVE — AB

## 2023-08-09 MED ORDER — BENZONATATE 100 MG PO CAPS: 100.0000 mg | ORAL_CAPSULE | Freq: Three times a day (TID) | ORAL | 0 refills | Status: AC | PRN

## 2023-08-09 NOTE — ED Notes (Signed)
Pt's son called, he states his wife will come to pick up pt and will call when she is here.

## 2023-08-09 NOTE — ED Triage Notes (Signed)
Pt to ED from home, [facility] for mechanical fall (slid off toilet onto butt).  No LOC, no head trauma. Hx dementia, MS. Unknown if takes thinners. Son Patrcia Dolly is POA requested pt to be eva at ED. Son also told EMS pt also has productive cough with brown -yellow sputum.  Pt at baseline, was complaining of lower back pain but not anymore. Pt denies urinary symptoms.  EMS VS: 99.1 oral, 98% RA, 137/75  Family who were at the house were poor historians, unknown if other PMH besides what is in chart

## 2023-08-09 NOTE — ED Notes (Signed)
 ED Provider at bedside.

## 2023-08-09 NOTE — ED Notes (Signed)
Pt's son, Nakayla Rorabaugh, called back to report he will be here in about 30 minutes to pick up pt.

## 2023-08-09 NOTE — ED Provider Notes (Signed)
[facility] Provider Note    Event Date/Time   First MD Initiated Contact with Patient 08/09/23 Ernestina Columbia     (approximate)   History   Fall and Cough (Per son)   HPI  [name] is a 68 y.o. female with PMH of MS, dementia who presents for evaluation after a mechanical fall today.  Patient slid off the toilet onto her bottom.  No LOC, did not hit her head.  Patient also had a productive cough with brown to yellow sputum.  Patient denies any pain at this time aside from left leg pain which she states she has had for a very O[place] time.      Physical Exam   Triage Vital Signs: ED Triage Vitals  Encounter Vitals Group     BP 08/09/23 1845 113/64     Systolic BP Percentile --      Diastolic BP Percentile --      Pulse Rate 08/09/23 1845 69     Resp 08/09/23 1845 20     Temp 08/09/23 1845 99 F (37.2 C)     Temp Source 08/09/23 1845 Oral     SpO2 08/09/23 1845 97 %     Weight 08/09/23 1842 150 lb (68 kg)     Height 08/09/23 1842 5\' 6"  (1.676 m)     Head Circumference --      Peak Flow --      Pain Score 08/09/23 1916 0     Pain Loc --      Pain Education --      Exclude from Growth Chart --     Most recent vital signs: Vitals:   08/09/23 1845 08/09/23 1916  BP: 113/64 121/61  Pulse: 69 61  Resp: 20   Temp: 99 F (37.2 C) 98.1 F (36.7 C)  SpO2: 97% 99%   General: Awake, no distress.  CV:  Good peripheral perfusion.  RRR. Resp:  Normal effort.  CTAB. Abd:  No distention.  Soft, nontender. Other:  No TTP over the spine or paraspinal muscles, no TTP throughout bilateral upper and lower extremities.  Cranial nerves II through XII grossly intact.   ED Results / Procedures / Treatments   Labs (all labs ordered are listed, but only abnormal results are displayed) Labs Reviewed  RESP PANEL BY RT-PCR (RSV, FLU A&B, COVID)  [id number] - Abnormal; Notable for the following components:      Result Value   SARS Coronavirus 2 by RT PCR POSITIVE  (*)    All other components within normal limits  URINALYSIS, ROUTINE W REFLEX MICROSCOPIC - Abnormal; Notable for the following components:   Color, Urine STRAW (*)    APPearance CLEAR (*)    Hgb urine dipstick MODERATE (*)    All other components within normal limits   RADIOLOGY  Chest x-ray obtained, I interpreted the images as well as reviewed the radiologist report, which was negative for any acute cardiopulmonary abnormalities.   PROCEDURES:  Critical Care performed: No  Procedures   MEDICATIONS ORDERED IN ED: Medications - No data to display   IMPRESSION / MDM / ASSESSMENT AND PLAN / ED COURSE  I reviewed the triage vital signs and the nursing notes.                             68 year old presents for evaluation after a mechanical fall and for cough.  Vital signs are stable patient  NAD and nontoxic-appearing on exam.  Differential diagnosis includes, but is not limited to, muscle strain, contusion, fracture, viral URI, pneumonia, bronchitis.  Patient's presentation is most consistent with acute complicated illness / injury requiring diagnostic workup.  Urinalysis unremarkable aside from hemoglobin.  Respiratory panel positive for COVID.  Chest x-ray was negative.  Since patient did not hit her head I do not feel that CT imaging is necessary at this time.  Reviewed patient's med list and do not see any blood thinners.  She denies passing out and states she did not feel dizzy before she fell but I do not feel that workup for syncope is needed at this time.  Physical exam is reassuring.  Patient does not have any pain to palpation.  Although she does have a cough, vitals are stable and there are no concerns for respiratory distress at this time.  Patient can have Tylenol and ibuprofen as needed for pain.  I will also send a cough medication to her pharmacy.  She voiced understanding, all questions were answered and she was stable at discharge.      FINAL CLINICAL  IMPRESSION(S) / ED DIAGNOSES   Final diagnoses:  COVID     Rx / DC Orders   ED Discharge Orders          Ordered    benzonatate (TESSALON PERLES) 100 MG capsule  3 times daily PRN        08/09/23 2030             Note:  This document was prepared using Dragon voice recognition software and may include unintentional dictation errors.   Cameron Ali, PA-C 08/09/23 2039    Trinna Post, MD 08/10/23 0010

## 2023-08-09 NOTE — Discharge Instructions (Signed)
You tested positive for COVID today.  This is a viral illness and resolve on its own with time.  You can take Tylenol and ibuprofen as needed for pain and fevers.  You can also take over-the-counter cold medications.  I have sent a medication called Tessalon Perles to the pharmacy.  This is a cough medicine that can be taken 3 times a day as needed.  Return to the ED with any new or worsening symptoms, otherwise follow-up with your primary care provider as needed.

## 2023-08-09 NOTE — ED Notes (Signed)
Attempted calling pt's son, no answer at this time.

## 2023-08-18 ENCOUNTER — Telehealth: Payer: Self-pay | Admitting: *Deleted

## 2023-08-18 NOTE — Progress Notes (Unsigned)
 Complex Care Management Care Guide Note  08/18/2023 Name: [name] MRN: [id number] DOB: July 07, 1955  [name] is a 68 y.o. year old female who is a primary care patient of Smitty Cords, DO and is actively engaged with the care management team. I reached out to [name] by phone today to assist with re-scheduling  with the RN Case Manager.  Follow up plan: Unsuccessful telephone outreach attempt made. A HIPAA compliant phone message was left for the patient providing contact information and requesting a return call.  Burman Nieves, CMA, Care Guide [facility]  [facility] Guide Direct Dial: [phone]  Fax: [phone] Website: [email or web address]

## 2023-08-22 NOTE — Progress Notes (Signed)
 Complex Care Management Care Guide Note  08/22/2023 Name: [name] MRN: [id number] DOB: 1955-09-25  [name] is a 68 y.o. year old female who is a primary care patient of Smitty Cords, DO and is actively engaged with the care management team. I reached out to [name] by phone today to assist with re-scheduling  with the RN Case Manager.  Follow up plan: Telephone appointment with complex care management team member scheduled for:  09/04/2023  Burman Nieves, CMA, Care Guide [facility], [facility] Guide Direct Dial: [phone]  Fax: [phone] Website: [email or web address]

## 2023-09-04 ENCOUNTER — Telehealth: Payer: Self-pay

## 2023-09-04 ENCOUNTER — Other Ambulatory Visit: Payer: Self-pay

## 2023-09-04 NOTE — Patient Outreach (Signed)
  Care Management   Outreach Note  09/04/2023 Name: LATRISA HELLUMS MRN: [id number] DOB: Aug 31, 1955  An unsuccessful outreach attempt was made today for a scheduled Care Management visit.   Follow Up Plan:  A HIPAA compliant phone message was left for the patient providing contact information and requesting a return call.     Juanell Fairly [facility] Population Health RN Care Manager Direct Dial: [phone]  Fax: [phone] Website: [email or web address]

## 2023-09-23 ENCOUNTER — Other Ambulatory Visit: Payer: Self-pay | Admitting: Family Medicine

## 2023-09-23 DIAGNOSIS — G35 Multiple sclerosis: Secondary | ICD-10-CM

## 2023-09-23 DIAGNOSIS — G894 Chronic pain syndrome: Secondary | ICD-10-CM

## 2023-09-25 NOTE — Telephone Encounter (Signed)
 Requested medications are due for refill today.  yes  Requested medications are on the active medications list.  yes  Last refill. 04/24/2023 #30 5 rf  Future visit scheduled.   no  Notes to clinic.  Refill not delegated..    Requested Prescriptions  Pending Prescriptions Disp Refills   traMADol (ULTRAM) 50 MG tablet [Pharmacy Med Name: tramadol 50 mg tablet] 30 tablet 5    Sig: TAKE ONE TABLET (50 MG TOTAL) BY MOUTH AT BEDTIME     Not Delegated - Analgesics:  Opioid Agonists Failed - 09/25/2023 12:25 PM      Failed - This refill cannot be delegated      Failed - Urine Drug Screen completed in last 360 days      Failed - Valid encounter within last 3 months    Recent Outpatient Visits   None

## 2023-11-26 ENCOUNTER — Other Ambulatory Visit: Payer: Self-pay

## 2024-03-11 ENCOUNTER — Other Ambulatory Visit

## 2024-04-23 NOTE — Progress Notes (Signed)
 [name]                                          MRN: [id number]   04/23/2024   The [facility] Quality Team Specialist reviewed this patient medical record for the purposes of chart review for care gap closure. The following were reviewed: chart review for care gap closure-colorectal cancer screening.    [facility] Quality Team
